# Patient Record
Sex: Female | Born: 1950 | Race: Black or African American | Hispanic: No | Marital: Single | Smoking: Never smoker
Health system: Southern US, Community
[De-identification: ages and names within clinical notes are randomized; demographics above are authoritative.]

## PROBLEM LIST (undated history)

## (undated) DIAGNOSIS — K219 Gastro-esophageal reflux disease without esophagitis: Secondary | ICD-10-CM

## (undated) DIAGNOSIS — I10 Essential (primary) hypertension: Secondary | ICD-10-CM

## (undated) DIAGNOSIS — M1990 Unspecified osteoarthritis, unspecified site: Secondary | ICD-10-CM

## (undated) DIAGNOSIS — E039 Hypothyroidism, unspecified: Secondary | ICD-10-CM

## (undated) DIAGNOSIS — Z96651 Presence of right artificial knee joint: Secondary | ICD-10-CM

## (undated) DIAGNOSIS — M1710 Unilateral primary osteoarthritis, unspecified knee: Secondary | ICD-10-CM

## (undated) DIAGNOSIS — E559 Vitamin D deficiency, unspecified: Secondary | ICD-10-CM

## (undated) DIAGNOSIS — R42 Dizziness and giddiness: Secondary | ICD-10-CM

## (undated) DIAGNOSIS — R609 Edema, unspecified: Secondary | ICD-10-CM

## (undated) DIAGNOSIS — K469 Unspecified abdominal hernia without obstruction or gangrene: Secondary | ICD-10-CM

## (undated) DIAGNOSIS — D179 Benign lipomatous neoplasm, unspecified: Secondary | ICD-10-CM

## (undated) DIAGNOSIS — Z96652 Presence of left artificial knee joint: Secondary | ICD-10-CM

## (undated) DIAGNOSIS — R000 Tachycardia, unspecified: Secondary | ICD-10-CM

## (undated) DIAGNOSIS — E669 Obesity, unspecified: Secondary | ICD-10-CM

## (undated) DIAGNOSIS — T7840XA Allergy, unspecified, initial encounter: Secondary | ICD-10-CM

## (undated) DIAGNOSIS — E1169 Type 2 diabetes mellitus with other specified complication: Secondary | ICD-10-CM

## (undated) DIAGNOSIS — H9190 Unspecified hearing loss, unspecified ear: Secondary | ICD-10-CM

## (undated) DIAGNOSIS — G5603 Carpal tunnel syndrome, bilateral upper limbs: Secondary | ICD-10-CM

## (undated) DIAGNOSIS — D62 Acute posthemorrhagic anemia: Secondary | ICD-10-CM

## (undated) DIAGNOSIS — K5792 Diverticulitis of intestine, part unspecified, without perforation or abscess without bleeding: Secondary | ICD-10-CM

## (undated) HISTORY — DX: Presence of left artificial knee joint: Z96.652

## (undated) HISTORY — PX: COLONOSCOPY: SHX174

## (undated) HISTORY — DX: Acute posthemorrhagic anemia: D62

## (undated) HISTORY — DX: Benign lipomatous neoplasm, unspecified: D17.9

## (undated) HISTORY — DX: Unilateral primary osteoarthritis, unspecified knee: M17.10

## (undated) HISTORY — DX: Diverticulitis of intestine, part unspecified, without perforation or abscess without bleeding: K57.92

## (undated) HISTORY — DX: Hypothyroidism, unspecified: E03.9

## (undated) HISTORY — DX: Type 2 diabetes mellitus with other specified complication: E11.69

## (undated) HISTORY — DX: Unspecified osteoarthritis, unspecified site: M19.90

## (undated) HISTORY — DX: Obesity, unspecified: E66.9

## (undated) HISTORY — DX: Morbid (severe) obesity due to excess calories: E66.01

## (undated) HISTORY — DX: Tachycardia, unspecified: R00.0

## (undated) HISTORY — PX: JOINT REPLACEMENT: SHX530

## (undated) HISTORY — DX: Carpal tunnel syndrome, bilateral upper limbs: G56.03

## (undated) HISTORY — PX: TUBAL LIGATION: SHX77

## (undated) HISTORY — PX: CARPAL TUNNEL RELEASE: SHX101

## (undated) HISTORY — DX: Presence of right artificial knee joint: Z96.651

## (undated) HISTORY — DX: Vitamin D deficiency, unspecified: E55.9

## (undated) HISTORY — DX: Edema, unspecified: R60.9

## (undated) HISTORY — DX: Allergy, unspecified, initial encounter: T78.40XA

## (undated) HISTORY — DX: Unspecified abdominal hernia without obstruction or gangrene: K46.9

## (undated) HISTORY — PX: ANKLE FRACTURE SURGERY: SHX122

---

## 2003-07-24 ENCOUNTER — Emergency Department (HOSPITAL_COMMUNITY): Admission: AD | Admit: 2003-07-24 | Discharge: 2003-07-24 | Payer: Self-pay | Admitting: Emergency Medicine

## 2004-10-13 ENCOUNTER — Emergency Department (HOSPITAL_COMMUNITY): Admission: EM | Admit: 2004-10-13 | Discharge: 2004-10-13 | Payer: Self-pay | Admitting: Family Medicine

## 2006-08-01 ENCOUNTER — Ambulatory Visit: Payer: Self-pay | Admitting: *Deleted

## 2006-08-01 ENCOUNTER — Ambulatory Visit: Payer: Self-pay | Admitting: Family Medicine

## 2006-12-18 HISTORY — PX: NOSE SURGERY: SHX723

## 2007-01-14 ENCOUNTER — Ambulatory Visit: Payer: Self-pay | Admitting: Family Medicine

## 2007-02-18 ENCOUNTER — Ambulatory Visit: Payer: Self-pay | Admitting: Family Medicine

## 2007-04-24 ENCOUNTER — Ambulatory Visit (HOSPITAL_COMMUNITY): Admission: RE | Admit: 2007-04-24 | Discharge: 2007-04-24 | Payer: Self-pay | Admitting: Family Medicine

## 2007-07-23 ENCOUNTER — Ambulatory Visit: Payer: Self-pay | Admitting: Family Medicine

## 2007-07-25 ENCOUNTER — Ambulatory Visit: Payer: Self-pay | Admitting: Internal Medicine

## 2007-09-04 ENCOUNTER — Encounter (INDEPENDENT_AMBULATORY_CARE_PROVIDER_SITE_OTHER): Payer: Self-pay | Admitting: *Deleted

## 2007-10-16 DIAGNOSIS — E1159 Type 2 diabetes mellitus with other circulatory complications: Secondary | ICD-10-CM | POA: Insufficient documentation

## 2007-10-16 DIAGNOSIS — M1710 Unilateral primary osteoarthritis, unspecified knee: Secondary | ICD-10-CM

## 2007-10-16 DIAGNOSIS — I152 Hypertension secondary to endocrine disorders: Secondary | ICD-10-CM | POA: Insufficient documentation

## 2007-10-16 DIAGNOSIS — I10 Essential (primary) hypertension: Secondary | ICD-10-CM

## 2007-10-16 DIAGNOSIS — IMO0002 Reserved for concepts with insufficient information to code with codable children: Secondary | ICD-10-CM

## 2007-10-16 HISTORY — DX: Morbid (severe) obesity due to excess calories: E66.01

## 2007-10-16 HISTORY — DX: Reserved for concepts with insufficient information to code with codable children: IMO0002

## 2007-11-13 ENCOUNTER — Emergency Department (HOSPITAL_COMMUNITY): Admission: EM | Admit: 2007-11-13 | Discharge: 2007-11-14 | Payer: Self-pay | Admitting: Emergency Medicine

## 2007-11-14 ENCOUNTER — Emergency Department (HOSPITAL_COMMUNITY): Admission: EM | Admit: 2007-11-14 | Discharge: 2007-11-14 | Payer: Self-pay | Admitting: Emergency Medicine

## 2007-11-15 ENCOUNTER — Emergency Department (HOSPITAL_COMMUNITY): Admission: EM | Admit: 2007-11-15 | Discharge: 2007-11-15 | Payer: Self-pay | Admitting: Emergency Medicine

## 2007-11-17 ENCOUNTER — Emergency Department (HOSPITAL_COMMUNITY): Admission: EM | Admit: 2007-11-17 | Discharge: 2007-11-17 | Payer: Self-pay | Admitting: Emergency Medicine

## 2007-11-18 ENCOUNTER — Ambulatory Visit (HOSPITAL_COMMUNITY): Admission: RE | Admit: 2007-11-18 | Discharge: 2007-11-18 | Payer: Self-pay | Admitting: Otolaryngology

## 2007-11-21 ENCOUNTER — Observation Stay (HOSPITAL_COMMUNITY): Admission: EM | Admit: 2007-11-21 | Discharge: 2007-11-22 | Payer: Self-pay | Admitting: Emergency Medicine

## 2008-03-02 ENCOUNTER — Emergency Department (HOSPITAL_COMMUNITY): Admission: EM | Admit: 2008-03-02 | Discharge: 2008-03-02 | Payer: Self-pay | Admitting: Emergency Medicine

## 2010-05-11 ENCOUNTER — Emergency Department (HOSPITAL_COMMUNITY): Admission: EM | Admit: 2010-05-11 | Discharge: 2010-05-11 | Payer: Self-pay | Admitting: Internal Medicine

## 2011-01-17 NOTE — Miscellaneous (Signed)
Summary: VIP  Patient: [name] Note: All result statuses are Final unless otherwise noted.  Tests: (1) VIP (Medications)   LLIMPORTMEDS              "Result Below..."       RESULT: METFORMIN HCL TABS 850 MG*TAKE ONE (1) TABLET BY MOUTH TWO (2) TIMES DAILY*09/03/2007*Last Refill: [id number]*******   LLIMPORTMEDS              "Result Below..."       RESULT: HYDROCHLOROTHIAZIDE TABS 25 MG*TAKE ONE-HALF TABLET BY MOUTH EVERY MORNING*09/03/2007*Last Refill: Unknown*[id number]*******   LLIMPORTMEDS              "Result Below..."       RESULT: BENAZEPRIL HCL TABS 10 MG*TAKE ONE (1) TABLET BY MOUTH EVERY DAY*09/03/2007*Last Refill: [id number]*[id number]*******   LLIMPORTMEDS              "Result Below..."       RESULT: AMARYL TABS 4 MG*TAKE ONE TABLET BY MOUTH EVERY MORNING AND TAKE ONE-HALF TABLET BY MOUTH EACH EVENING WITH SUPPER GENE HAS ICP AMARYL 4MG  05/08*09/03/2007*Last Refill: Unknown*[id number]*******   LLIMPORTALLS              NKDA***  Note: An exclamation mark (!) indicates a result that was not dispersed into the flowsheet. Document Creation Date: 10/17/2007 3:05 PM _______________________________________________________________________  (1) Order result status: Final Collection or observation date-time: 09/04/2007 Requested date-time: 09/04/2007 Receipt date-time:  Reported date-time: 09/04/2007 Referring Physician:   Ordering Physician:   Specimen Source:  Source: [place] Order Number:  Lab site:

## 2011-02-08 ENCOUNTER — Other Ambulatory Visit (HOSPITAL_COMMUNITY): Payer: Self-pay | Admitting: Internal Medicine

## 2011-02-08 DIAGNOSIS — Z1231 Encounter for screening mammogram for malignant neoplasm of breast: Secondary | ICD-10-CM

## 2011-02-16 ENCOUNTER — Ambulatory Visit (HOSPITAL_COMMUNITY)
Admission: RE | Admit: 2011-02-16 | Discharge: 2011-02-16 | Disposition: A | Discharge status: Home / Self Care | Payer: Self-pay | Source: Ambulatory Visit | Attending: Internal Medicine | Admitting: Internal Medicine

## 2011-02-16 DIAGNOSIS — Z1231 Encounter for screening mammogram for malignant neoplasm of breast: Secondary | ICD-10-CM | POA: Insufficient documentation

## 2011-05-02 NOTE — Op Note (Signed)
NAME:  [name], [name]                ACCOUNT NO.:  [id number]   MEDICAL RECORD NO.:  [id number]          PATIENT TYPE:  AMB   LOCATION:  SDS                          FACILITY:  [facility]   PHYSICIAN:  Christopher E. Ezzard Standing, M.D.DATE OF BIRTH:  04-14-1951   DATE OF PROCEDURE:  11/18/2007  DATE OF DISCHARGE:  11/18/2007                               OPERATIVE REPORT   .   PREOPERATIVE DIAGNOSIS:  Recurrent left sided epistaxis.   POSTOPERATIVE DIAGNOSIS:  Recurrent left sided epistaxis.   OPERATION:  Endoscopic evaluation and cauterization of left sided  epistaxis.   SURGEON:  Dillard Cannon, M.D.   ANESTHESIA:  General endotracheal.   COMPLICATIONS:  None.   BRIEF CLINICAL NOTE:  [name] is a 60 year old female who has had  recurrent nose bleeds this past week.  She has been to the emergency  room three times and had her nose packed with recurrent bleeding.  I saw  her in the emergency room last night with bleeding from the superior  portion of the left nasal cavity.  I could not identify definite site or  origin of the nose bleed.  The nose was packed with some Vaseline gauze,  but she has continued to have some recurrent episodes of bleeding since  that time.  She is taken to the operating room at this time for  cauterization of left-sided epistaxis.  Her hemoglobin initially was 12  on November 14, 2007, four days ago and presently hemoglobin is 9.1.   DESCRIPTION OF PROCEDURE:  After adequate endotracheal anesthesia, the  nose was evaluated.  The patient had several scabs and crusting on the  septum as well as the anterior middle turbinate where she had previous  cauterization and packing placed.  There is a little bit of brisk  bleeding from superior portion of the septum on the left side and this  was cauterized with suction cautery.  All the bleeding sites were  cauterized with suction cautery including the inferior turbinate and  anterior left middle turbinate.   Next, nasal endoscopy was performed  with the 0 endoscope.  Posterior nasal cavity was evaluated.  There is  no obvious bleeding site.  There is no blood clot or bleeding in the  posterior nasal cavity.  The middle meatus was evaluated and again no  obvious bleeding site or prominent vessel identified.  After adequate  cauterization and no further bleeding noted,  the nose was packed with  Gelfoam soaked in thrombin placed in the superior nasal cavity  anteriorly and then the floor of the nose along the inferior turbinate  was packed with a single Merocel soaked in bacitracin ointment.  [name]  was awoken from anesthesia and transferred to recovery and postop doing  well.   DISPOSITION:  [name] was discharged home later this evening.  She has  antibiotics that she will continue taking along with regular medicines,  Tylenol p.r.n. pain.  I will have her follow-up in my office in four  days to have the Merocel pack removed.           ______________________________  Kristine Garbe. Ezzard Standing, M.D.     CEN/MEDQ  D:  11/18/2007  T:  11/19/2007  Job:  [id number]   cc:   Kristine Garbe. Ezzard Standing, M.D.

## 2011-05-02 NOTE — Consult Note (Signed)
[name]                ACCOUNT NO.:  [id number]   MEDICAL RECORD NO.:  [id number]          PATIENT TYPE:  EMS   LOCATION:  ED                           FACILITY:  [facility]   PHYSICIAN:  Kristine Garbe. Ezzard Standing, M.D.DATE OF BIRTH:  March 16, 1951   DATE OF CONSULTATION:  11/17/2007  DATE OF DISCHARGE:  11/17/2007                                 CONSULTATION   REASON FOR CONSULT:  Evaluate patient with epistaxis.   BRIEF HISTORY:  [name] as is a 60 year old female who has had  recurrent left-sided nosebleeds for the past 4 days and she has had 3  visits to [facility] Emergency Room because of recurrent bleeding.  She  has had cauterization as well as Merocel pack, as well as Rhino Rocket  packing with recurrent bleeding.  Bleeding has been from the left side.   PHYSICAL EXAMINATION:  HEENT:  On examination, she has bleeding from the  left nostril on removing the Merocel pack.  The bleeding appears to be  coming from the superior mid posterior portion of the nose, but I really  could not identify a definite site of bleeding, but had a fair amount of  blood coming down from the superior aspect of the nose.  I was unable to  adequately visualize a site or origin of bleeding for adequate  cauterization.  I was able to pack the nose with Vaseline gauze 1/2-inch  packing, which adequately controlled the bleeding.   IMPRESSION:  Epistaxis from the superoposterior left nasal cavity.  This  was packed with 1/2-inch Vaseline gauze.  We will plan on leaving this  in place for 4 days and have her follow up in my office on Thursday for  recheck to have the nasal packs removed.  She is on Augmentin antibiotic  and Vicodin for pain.  She is instructed to stop her aspirin.           ______________________________  Kristine Garbe. Ezzard Standing, M.D.     CEN/MEDQ  D:  11/17/2007  T:  11/18/2007  Job:  [id number]   cc:   Kristine Garbe. Ezzard Standing, M.D.  Fax: [phone]

## 2011-05-02 NOTE — H&P (Signed)
NAMETAELAR, GRONEWOLD                ACCOUNT NO.:  [id number]   MEDICAL RECORD NO.:  [id number]          PATIENT TYPE:  OBV   LOCATION:  1318                         FACILITY:  [facility]   PHYSICIAN:  Kristine Garbe. Ezzard Standing, M.D.DATE OF BIRTH:  1951-02-27   DATE OF ADMISSION:  11/20/2007  DATE OF DISCHARGE:                              HISTORY & PHYSICAL   REASON FOR ADMISSION:  Patient with recurrent epistaxis.   BRIEF HISTORY:  [name] is a 60 year old female with history of  hypertension and non-insulin dependent diabetes.  She initially had  bleeding from the left nostril that started about a week ago.  She was  seen in [facility] Room three times because of  recurrent bleeding despite packing.  She was subsequently taken to the  operating room on Monday, November 18, 2007, where she had cauterization  of several areas in the left side of the nose and the nose was packed  with Merocel pack.  She did well for a couple of days but then had  recurrent bleeding yesterday and again last night from the portion of  her nose as well as down the throat and from the right nostril.  She  presented back to the emergency room where the bleeding again subsided  and patient was admitted at this time for observation.  Admission  hemoglobin was 8.5.  Patient is presently having no bleeding.   MEDICATIONS:  1. Amaryl one in the morning and half a tablet at night.  2. Metformin 850 mg b.i.d.  3. Benzopril 10 mg daily.  4. Hydrochlorothiazide, she takes mostly during the summer when she      gets swelling in her ankles and does not take as much in the winter      months.  5. She has also been placed on Augmentin since her nose has been      packed, 875 b.i.d.  6. She stopped aspirin this past weekend when she started having nose      bleeds.   PAST MEDICAL HISTORY:  Significant for  1. Hypertension.  2. Non-insulin dependent diabetes.   PHYSICAL EXAMINATION:  GENERAL  APPEARANCE:  The patient is alert and  oriented and having no airway problems and no active bleeding.  HEENT:  She has a Merocel pack in the left nostril.  She is breathing  through her right nostril.  There is no active bleeding noted, although  she states she feels a throbbing in her nose.  LUNGS:  Clear.  CARDIOVASCULAR:  Regular rate and rhythm without murmur  NEUROLOGIC:  Intact.   IMPRESSION:  Recurrent left-sided epistaxis posterior.   RECOMMENDATIONS:  Will observe in hospital for 24 hours and if she has  recurrent bleeding, will plan on taking back to the operating room for  removal of packing and recauterization as necessary.           ______________________________  Kristine Garbe Ezzard Standing, M.D.     CEN/MEDQ  D:  11/21/2007  T:  11/21/2007  Job:  [id number]

## 2011-07-02 ENCOUNTER — Inpatient Hospital Stay (HOSPITAL_COMMUNITY)
Admission: EM | Admit: 2011-07-02 | Discharge: 2011-07-18 | DRG: 354 | Disposition: A | Discharge status: Home / Self Care | Payer: Medicaid Other | Attending: Surgery | Admitting: Surgery

## 2011-07-02 ENCOUNTER — Emergency Department (HOSPITAL_COMMUNITY): Payer: Medicaid Other

## 2011-07-02 ENCOUNTER — Encounter (HOSPITAL_COMMUNITY): Payer: Self-pay | Admitting: Radiology

## 2011-07-02 DIAGNOSIS — M1710 Unilateral primary osteoarthritis, unspecified knee: Secondary | ICD-10-CM | POA: Diagnosis present

## 2011-07-02 DIAGNOSIS — Z5331 Laparoscopic surgical procedure converted to open procedure: Secondary | ICD-10-CM

## 2011-07-02 DIAGNOSIS — K436 Other and unspecified ventral hernia with obstruction, without gangrene: Secondary | ICD-10-CM

## 2011-07-02 DIAGNOSIS — K560 Paralytic ileus: Secondary | ICD-10-CM | POA: Diagnosis not present

## 2011-07-02 DIAGNOSIS — Z6841 Body Mass Index (BMI) 40.0 and over, adult: Secondary | ICD-10-CM

## 2011-07-02 DIAGNOSIS — Y838 Other surgical procedures as the cause of abnormal reaction of the patient, or of later complication, without mention of misadventure at the time of the procedure: Secondary | ICD-10-CM | POA: Diagnosis not present

## 2011-07-02 DIAGNOSIS — E669 Obesity, unspecified: Secondary | ICD-10-CM | POA: Diagnosis present

## 2011-07-02 DIAGNOSIS — K929 Disease of digestive system, unspecified: Secondary | ICD-10-CM | POA: Diagnosis not present

## 2011-07-02 DIAGNOSIS — I10 Essential (primary) hypertension: Secondary | ICD-10-CM | POA: Diagnosis present

## 2011-07-02 DIAGNOSIS — E119 Type 2 diabetes mellitus without complications: Secondary | ICD-10-CM | POA: Diagnosis present

## 2011-07-02 LAB — COMPREHENSIVE METABOLIC PANEL
ALT: 8 U/L (ref 0–35)
AST: 11 U/L (ref 0–37)
Albumin: 3.7 g/dL (ref 3.5–5.2)
Alkaline Phosphatase: 80 U/L (ref 39–117)
BUN: 14 mg/dL (ref 6–23)
CO2: 26 mEq/L (ref 19–32)
Calcium: 9.7 mg/dL (ref 8.4–10.5)
Chloride: 98 mEq/L (ref 96–112)
Creatinine, Ser: 0.68 mg/dL (ref 0.50–1.10)
GFR calc Af Amer: >60 mL/min (ref >60)
GFR calc non Af Amer: >60 mL/min (ref >60)
Glucose, Bld: 242 mg/dL — ABNORMAL HIGH (ref 70–99)
Potassium: 3.8 mEq/L (ref 3.5–5.1)
Sodium: 137 mEq/L (ref 135–145)
Total Bilirubin: 0.3 mg/dL (ref 0.3–1.2)
Total Protein: 7.7 g/dL (ref 6.0–8.3)

## 2011-07-02 LAB — CBC
HCT: 40.7 % (ref 36.0–46.0)
Hemoglobin: 13.1 g/dL (ref 12.0–15.0)
MCH: 30.3 pg (ref 26.0–34.0)
MCHC: 32.2 g/dL (ref 30.0–36.0)
MCV: 94 fL (ref 78.0–100.0)
Platelets: 275 10*3/uL (ref 150–400)
RBC: 4.33 MIL/uL (ref 3.87–5.11)
RDW: 12.8 % (ref 11.5–15.5)
WBC: 12.1 10*3/uL — ABNORMAL HIGH (ref 4.0–10.5)

## 2011-07-02 LAB — URINALYSIS, ROUTINE W REFLEX MICROSCOPIC
Bilirubin Urine: NEGATIVE
Glucose, UA: NEGATIVE mg/dL
Hgb urine dipstick: NEGATIVE
Ketones, ur: NEGATIVE mg/dL
Leukocytes, UA: NEGATIVE
Nitrite: NEGATIVE
Protein, ur: NEGATIVE mg/dL
Specific Gravity, Urine: 1.023 (ref 1.005–1.030)
Urobilinogen, UA: 0.2 mg/dL (ref 0.0–1.0)
pH: 5.5 (ref 5.0–8.0)

## 2011-07-02 LAB — GLUCOSE, CAPILLARY
Glucose-Capillary: 119 mg/dL — ABNORMAL HIGH (ref 70–99)
Glucose-Capillary: 165 mg/dL — ABNORMAL HIGH (ref 70–99)
Glucose-Capillary: 185 mg/dL — ABNORMAL HIGH (ref 70–99)

## 2011-07-02 LAB — DIFFERENTIAL
Basophils Absolute: 0 10*3/uL (ref 0.0–0.1)
Basophils Relative: 0 % (ref 0–1)
Eosinophils Absolute: 0.1 10*3/uL (ref 0.0–0.7)
Eosinophils Relative: 1 % (ref 0–5)
Lymphocytes Relative: 10 % — ABNORMAL LOW (ref 12–46)
Lymphs Abs: 1.2 10*3/uL (ref 0.7–4.0)
Monocytes Absolute: 0.5 10*3/uL (ref 0.1–1.0)
Monocytes Relative: 4 % (ref 3–12)
Neutro Abs: 10.3 10*3/uL — ABNORMAL HIGH (ref 1.7–7.7)
Neutrophils Relative %: 85 % — ABNORMAL HIGH (ref 43–77)

## 2011-07-02 LAB — LIPASE, BLOOD: Lipase: 10 U/L — ABNORMAL LOW (ref 11–59)

## 2011-07-02 MED ORDER — IOHEXOL 300 MG/ML  SOLN
130.0000 mL | Freq: Once | INTRAMUSCULAR | Status: AC | PRN
  Administered 2011-07-02: 130 mL via INTRAVENOUS

## 2011-07-03 ENCOUNTER — Other Ambulatory Visit (INDEPENDENT_AMBULATORY_CARE_PROVIDER_SITE_OTHER): Payer: Self-pay | Admitting: Surgery

## 2011-07-03 DIAGNOSIS — K660 Peritoneal adhesions (postprocedural) (postinfection): Secondary | ICD-10-CM

## 2011-07-03 HISTORY — PX: HERNIA REPAIR: SHX51

## 2011-07-03 LAB — CBC
HCT: 35.4 % — ABNORMAL LOW (ref 36.0–46.0)
Hemoglobin: 11.2 g/dL — ABNORMAL LOW (ref 12.0–15.0)
MCH: 29.9 pg (ref 26.0–34.0)
MCHC: 31.6 g/dL (ref 30.0–36.0)
MCV: 94.7 fL (ref 78.0–100.0)
Platelets: 247 10*3/uL (ref 150–400)
RBC: 3.74 MIL/uL — ABNORMAL LOW (ref 3.87–5.11)
RDW: 13 % (ref 11.5–15.5)
WBC: 8.9 10*3/uL (ref 4.0–10.5)

## 2011-07-03 LAB — GLUCOSE, CAPILLARY
Glucose-Capillary: 200 mg/dL — ABNORMAL HIGH (ref 70–99)
Glucose-Capillary: 129 mg/dL — ABNORMAL HIGH (ref 70–99)
Glucose-Capillary: 221 mg/dL — ABNORMAL HIGH (ref 70–99)
Glucose-Capillary: 172 mg/dL — ABNORMAL HIGH (ref 70–99)

## 2011-07-03 LAB — BASIC METABOLIC PANEL
BUN: 9 mg/dL (ref 6–23)
CO2: 29 mEq/L (ref 19–32)
Calcium: 8.8 mg/dL (ref 8.4–10.5)
Chloride: 103 mEq/L (ref 96–112)
Creatinine, Ser: 0.67 mg/dL (ref 0.50–1.10)
GFR calc Af Amer: >60 mL/min (ref >60)
GFR calc non Af Amer: >60 mL/min (ref >60)
Glucose, Bld: 153 mg/dL — ABNORMAL HIGH (ref 70–99)
Potassium: 3.9 mEq/L (ref 3.5–5.1)
Sodium: 140 mEq/L (ref 135–145)

## 2011-07-03 LAB — HEMOGLOBIN A1C
Hgb A1c MFr Bld: 7.8 % — ABNORMAL HIGH (ref <5.7)
Mean Plasma Glucose: 177 mg/dL — ABNORMAL HIGH (ref <117)

## 2011-07-04 LAB — BASIC METABOLIC PANEL
BUN: 14 mg/dL (ref 6–23)
CO2: 27 mEq/L (ref 19–32)
Calcium: 8.3 mg/dL — ABNORMAL LOW (ref 8.4–10.5)
Chloride: 104 mEq/L (ref 96–112)
Creatinine, Ser: 0.99 mg/dL (ref 0.50–1.10)
GFR calc Af Amer: >60 mL/min (ref >60)
GFR calc non Af Amer: 58 mL/min — ABNORMAL LOW (ref >60)
Glucose, Bld: 134 mg/dL — ABNORMAL HIGH (ref 70–99)
Potassium: 4.4 mEq/L (ref 3.5–5.1)
Sodium: 140 mEq/L (ref 135–145)

## 2011-07-04 LAB — GLUCOSE, CAPILLARY
Glucose-Capillary: 123 mg/dL — ABNORMAL HIGH (ref 70–99)
Glucose-Capillary: 142 mg/dL — ABNORMAL HIGH (ref 70–99)
Glucose-Capillary: 153 mg/dL — ABNORMAL HIGH (ref 70–99)
Glucose-Capillary: 160 mg/dL — ABNORMAL HIGH (ref 70–99)
Glucose-Capillary: 168 mg/dL — ABNORMAL HIGH (ref 70–99)
Glucose-Capillary: 168 mg/dL — ABNORMAL HIGH (ref 70–99)

## 2011-07-04 LAB — MAGNESIUM: Magnesium: 1.6 mg/dL (ref 1.5–2.5)

## 2011-07-04 LAB — CBC
HCT: 34.2 % — ABNORMAL LOW (ref 36.0–46.0)
Hemoglobin: 10.9 g/dL — ABNORMAL LOW (ref 12.0–15.0)
MCH: 30.1 pg (ref 26.0–34.0)
MCHC: 31.9 g/dL (ref 30.0–36.0)
MCV: 94.5 fL (ref 78.0–100.0)
Platelets: 249 10*3/uL (ref 150–400)
RBC: 3.62 MIL/uL — ABNORMAL LOW (ref 3.87–5.11)
RDW: 13.1 % (ref 11.5–15.5)
WBC: 11.3 10*3/uL — ABNORMAL HIGH (ref 4.0–10.5)

## 2011-07-04 LAB — PHOSPHORUS: Phosphorus: 3.6 mg/dL (ref 2.3–4.6)

## 2011-07-05 LAB — BASIC METABOLIC PANEL
BUN: 18 mg/dL (ref 6–23)
CO2: 24 mEq/L (ref 19–32)
Calcium: 8.1 mg/dL — ABNORMAL LOW (ref 8.4–10.5)
Chloride: 101 mEq/L (ref 96–112)
Creatinine, Ser: 0.89 mg/dL (ref 0.50–1.10)
GFR calc Af Amer: >60 mL/min (ref >60)
GFR calc non Af Amer: >60 mL/min (ref >60)
Glucose, Bld: 142 mg/dL — ABNORMAL HIGH (ref 70–99)
Potassium: 4.3 mEq/L (ref 3.5–5.1)
Sodium: 135 mEq/L (ref 135–145)

## 2011-07-05 LAB — GLUCOSE, CAPILLARY
Glucose-Capillary: 122 mg/dL — ABNORMAL HIGH (ref 70–99)
Glucose-Capillary: 134 mg/dL — ABNORMAL HIGH (ref 70–99)
Glucose-Capillary: 139 mg/dL — ABNORMAL HIGH (ref 70–99)
Glucose-Capillary: 150 mg/dL — ABNORMAL HIGH (ref 70–99)
Glucose-Capillary: 142 mg/dL — ABNORMAL HIGH (ref 70–99)

## 2011-07-05 LAB — PHOSPHORUS: Phosphorus: 2.8 mg/dL (ref 2.3–4.6)

## 2011-07-05 LAB — HEMOGLOBIN AND HEMATOCRIT, BLOOD
HCT: 33 % — ABNORMAL LOW (ref 36.0–46.0)
Hemoglobin: 10.6 g/dL — ABNORMAL LOW (ref 12.0–15.0)

## 2011-07-05 LAB — MAGNESIUM: Magnesium: 1.7 mg/dL (ref 1.5–2.5)

## 2011-07-06 LAB — GLUCOSE, CAPILLARY
Glucose-Capillary: 125 mg/dL — ABNORMAL HIGH (ref 70–99)
Glucose-Capillary: 142 mg/dL — ABNORMAL HIGH (ref 70–99)
Glucose-Capillary: 155 mg/dL — ABNORMAL HIGH (ref 70–99)
Glucose-Capillary: 152 mg/dL — ABNORMAL HIGH (ref 70–99)
Glucose-Capillary: 141 mg/dL — ABNORMAL HIGH (ref 70–99)
Glucose-Capillary: 166 mg/dL — ABNORMAL HIGH (ref 70–99)

## 2011-07-06 LAB — BASIC METABOLIC PANEL
BUN: 20 mg/dL (ref 6–23)
CO2: 20 mEq/L (ref 19–32)
Calcium: 8.7 mg/dL (ref 8.4–10.5)
Chloride: 102 mEq/L (ref 96–112)
Creatinine, Ser: 0.79 mg/dL (ref 0.50–1.10)
GFR calc Af Amer: >60 mL/min (ref >60)
GFR calc non Af Amer: >60 mL/min (ref >60)
Glucose, Bld: 143 mg/dL — ABNORMAL HIGH (ref 70–99)
Potassium: 5.1 mEq/L (ref 3.5–5.1)
Sodium: 136 mEq/L (ref 135–145)

## 2011-07-06 LAB — CBC
HCT: 33.4 % — ABNORMAL LOW (ref 36.0–46.0)
Hemoglobin: 10.6 g/dL — ABNORMAL LOW (ref 12.0–15.0)
MCH: 30.4 pg (ref 26.0–34.0)
MCHC: 31.7 g/dL (ref 30.0–36.0)
MCV: 95.7 fL (ref 78.0–100.0)
Platelets: 304 10*3/uL (ref 150–400)
RBC: 3.49 MIL/uL — ABNORMAL LOW (ref 3.87–5.11)
RDW: 13.4 % (ref 11.5–15.5)
WBC: 13.6 10*3/uL — ABNORMAL HIGH (ref 4.0–10.5)

## 2011-07-07 LAB — GLUCOSE, CAPILLARY
Glucose-Capillary: 135 mg/dL — ABNORMAL HIGH (ref 70–99)
Glucose-Capillary: 156 mg/dL — ABNORMAL HIGH (ref 70–99)
Glucose-Capillary: 179 mg/dL — ABNORMAL HIGH (ref 70–99)
Glucose-Capillary: 158 mg/dL — ABNORMAL HIGH (ref 70–99)
Glucose-Capillary: 158 mg/dL — ABNORMAL HIGH (ref 70–99)
Glucose-Capillary: 151 mg/dL — ABNORMAL HIGH (ref 70–99)
Glucose-Capillary: 179 mg/dL — ABNORMAL HIGH (ref 70–99)
Glucose-Capillary: 149 mg/dL — ABNORMAL HIGH (ref 70–99)

## 2011-07-08 ENCOUNTER — Inpatient Hospital Stay (HOSPITAL_COMMUNITY): Payer: Medicaid Other

## 2011-07-08 LAB — CBC
HCT: 33.8 % — ABNORMAL LOW (ref 36.0–46.0)
Hemoglobin: 10.8 g/dL — ABNORMAL LOW (ref 12.0–15.0)
MCH: 30.3 pg (ref 26.0–34.0)
MCHC: 32 g/dL (ref 30.0–36.0)
MCV: 94.7 fL (ref 78.0–100.0)
Platelets: 477 10*3/uL — ABNORMAL HIGH (ref 150–400)
RBC: 3.57 MIL/uL — ABNORMAL LOW (ref 3.87–5.11)
RDW: 13.1 % (ref 11.5–15.5)
WBC: 8.4 10*3/uL (ref 4.0–10.5)

## 2011-07-08 LAB — GLUCOSE, CAPILLARY
Glucose-Capillary: 120 mg/dL — ABNORMAL HIGH (ref 70–99)
Glucose-Capillary: 155 mg/dL — ABNORMAL HIGH (ref 70–99)
Glucose-Capillary: 164 mg/dL — ABNORMAL HIGH (ref 70–99)
Glucose-Capillary: 166 mg/dL — ABNORMAL HIGH (ref 70–99)
Glucose-Capillary: 166 mg/dL — ABNORMAL HIGH (ref 70–99)
Glucose-Capillary: 184 mg/dL — ABNORMAL HIGH (ref 70–99)
Glucose-Capillary: 187 mg/dL — ABNORMAL HIGH (ref 70–99)

## 2011-07-08 LAB — BASIC METABOLIC PANEL
BUN: 19 mg/dL (ref 6–23)
CO2: 17 mEq/L — ABNORMAL LOW (ref 19–32)
Calcium: 9.5 mg/dL (ref 8.4–10.5)
Chloride: 103 mEq/L (ref 96–112)
Creatinine, Ser: 0.69 mg/dL (ref 0.50–1.10)
GFR calc Af Amer: >60 mL/min (ref >60)
GFR calc non Af Amer: >60 mL/min (ref >60)
Glucose, Bld: 176 mg/dL — ABNORMAL HIGH (ref 70–99)
Potassium: 5 mEq/L (ref 3.5–5.1)
Sodium: 137 mEq/L (ref 135–145)

## 2011-07-09 LAB — GLUCOSE, CAPILLARY
Glucose-Capillary: 161 mg/dL — ABNORMAL HIGH (ref 70–99)
Glucose-Capillary: 154 mg/dL — ABNORMAL HIGH (ref 70–99)
Glucose-Capillary: 176 mg/dL — ABNORMAL HIGH (ref 70–99)
Glucose-Capillary: 137 mg/dL — ABNORMAL HIGH (ref 70–99)
Glucose-Capillary: 146 mg/dL — ABNORMAL HIGH (ref 70–99)

## 2011-07-10 LAB — GLUCOSE, CAPILLARY
Glucose-Capillary: 133 mg/dL — ABNORMAL HIGH (ref 70–99)
Glucose-Capillary: 141 mg/dL — ABNORMAL HIGH (ref 70–99)
Glucose-Capillary: 153 mg/dL — ABNORMAL HIGH (ref 70–99)
Glucose-Capillary: 154 mg/dL — ABNORMAL HIGH (ref 70–99)
Glucose-Capillary: 163 mg/dL — ABNORMAL HIGH (ref 70–99)
Glucose-Capillary: 174 mg/dL — ABNORMAL HIGH (ref 70–99)

## 2011-07-11 LAB — BASIC METABOLIC PANEL
BUN: 8 mg/dL (ref 6–23)
CO2: 21 mEq/L (ref 19–32)
Calcium: 9.4 mg/dL (ref 8.4–10.5)
Chloride: 105 mEq/L (ref 96–112)
Creatinine, Ser: 0.67 mg/dL (ref 0.50–1.10)
GFR calc Af Amer: >60 mL/min (ref >60)
GFR calc non Af Amer: >60 mL/min (ref >60)
Glucose, Bld: 183 mg/dL — ABNORMAL HIGH (ref 70–99)
Potassium: 5.6 mEq/L — ABNORMAL HIGH (ref 3.5–5.1)
Sodium: 136 mEq/L (ref 135–145)

## 2011-07-11 LAB — GLUCOSE, CAPILLARY
Glucose-Capillary: 112 mg/dL — ABNORMAL HIGH (ref 70–99)
Glucose-Capillary: 123 mg/dL — ABNORMAL HIGH (ref 70–99)
Glucose-Capillary: 129 mg/dL — ABNORMAL HIGH (ref 70–99)
Glucose-Capillary: 175 mg/dL — ABNORMAL HIGH (ref 70–99)
Glucose-Capillary: 181 mg/dL — ABNORMAL HIGH (ref 70–99)
Glucose-Capillary: 185 mg/dL — ABNORMAL HIGH (ref 70–99)

## 2011-07-11 LAB — CBC
HCT: 30.8 % — ABNORMAL LOW (ref 36.0–46.0)
Hemoglobin: 10 g/dL — ABNORMAL LOW (ref 12.0–15.0)
MCH: 30.2 pg (ref 26.0–34.0)
MCHC: 32.5 g/dL (ref 30.0–36.0)
MCV: 93.1 fL (ref 78.0–100.0)
Platelets: 477 10*3/uL — ABNORMAL HIGH (ref 150–400)
RBC: 3.31 MIL/uL — ABNORMAL LOW (ref 3.87–5.11)
RDW: 13.4 % (ref 11.5–15.5)
WBC: 10.5 10*3/uL (ref 4.0–10.5)

## 2011-07-12 LAB — GLUCOSE, CAPILLARY
Glucose-Capillary: 109 mg/dL — ABNORMAL HIGH (ref 70–99)
Glucose-Capillary: 110 mg/dL — ABNORMAL HIGH (ref 70–99)
Glucose-Capillary: 124 mg/dL — ABNORMAL HIGH (ref 70–99)
Glucose-Capillary: 139 mg/dL — ABNORMAL HIGH (ref 70–99)
Glucose-Capillary: 140 mg/dL — ABNORMAL HIGH (ref 70–99)
Glucose-Capillary: 97 mg/dL (ref 70–99)

## 2011-07-12 LAB — BASIC METABOLIC PANEL
BUN: 9 mg/dL (ref 6–23)
CO2: 21 mEq/L (ref 19–32)
Calcium: 9.2 mg/dL (ref 8.4–10.5)
Chloride: 105 mEq/L (ref 96–112)
Creatinine, Ser: 0.63 mg/dL (ref 0.50–1.10)
GFR calc Af Amer: >60 mL/min (ref >60)
GFR calc non Af Amer: >60 mL/min (ref >60)
Glucose, Bld: 111 mg/dL — ABNORMAL HIGH (ref 70–99)
Potassium: 4.1 mEq/L (ref 3.5–5.1)
Sodium: 138 mEq/L (ref 135–145)

## 2011-07-13 ENCOUNTER — Inpatient Hospital Stay (HOSPITAL_COMMUNITY): Payer: Medicaid Other

## 2011-07-13 LAB — BASIC METABOLIC PANEL
BUN: 8 mg/dL (ref 6–23)
CO2: 21 mEq/L (ref 19–32)
Calcium: 9 mg/dL (ref 8.4–10.5)
Chloride: 106 mEq/L (ref 96–112)
Creatinine, Ser: 0.6 mg/dL (ref 0.50–1.10)
GFR calc Af Amer: >60 mL/min (ref >60)
GFR calc non Af Amer: >60 mL/min (ref >60)
Glucose, Bld: 112 mg/dL — ABNORMAL HIGH (ref 70–99)
Potassium: 4.1 mEq/L (ref 3.5–5.1)
Sodium: 140 mEq/L (ref 135–145)

## 2011-07-13 LAB — GLUCOSE, CAPILLARY
Glucose-Capillary: 118 mg/dL — ABNORMAL HIGH (ref 70–99)
Glucose-Capillary: 96 mg/dL (ref 70–99)
Glucose-Capillary: 96 mg/dL (ref 70–99)

## 2011-07-14 LAB — CBC
HCT: 30.9 % — ABNORMAL LOW (ref 36.0–46.0)
Hemoglobin: 9.7 g/dL — ABNORMAL LOW (ref 12.0–15.0)
MCH: 29.3 pg (ref 26.0–34.0)
MCHC: 31.4 g/dL (ref 30.0–36.0)
MCV: 93.4 fL (ref 78.0–100.0)
Platelets: 472 10*3/uL — ABNORMAL HIGH (ref 150–400)
RBC: 3.31 MIL/uL — ABNORMAL LOW (ref 3.87–5.11)
RDW: 13.5 % (ref 11.5–15.5)
WBC: 12.8 10*3/uL — ABNORMAL HIGH (ref 4.0–10.5)

## 2011-07-14 LAB — BASIC METABOLIC PANEL
BUN: 5 mg/dL — ABNORMAL LOW (ref 6–23)
CO2: 21 mEq/L (ref 19–32)
Calcium: 8.8 mg/dL (ref 8.4–10.5)
Chloride: 108 mEq/L (ref 96–112)
Creatinine, Ser: 0.58 mg/dL (ref 0.50–1.10)
GFR calc Af Amer: >60 mL/min (ref >60)
GFR calc non Af Amer: >60 mL/min (ref >60)
Glucose, Bld: 118 mg/dL — ABNORMAL HIGH (ref 70–99)
Potassium: 3.6 mEq/L (ref 3.5–5.1)
Sodium: 142 mEq/L (ref 135–145)

## 2011-07-14 LAB — GLUCOSE, CAPILLARY
Glucose-Capillary: 102 mg/dL — ABNORMAL HIGH (ref 70–99)
Glucose-Capillary: 123 mg/dL — ABNORMAL HIGH (ref 70–99)
Glucose-Capillary: 90 mg/dL (ref 70–99)
Glucose-Capillary: 99 mg/dL (ref 70–99)

## 2011-07-15 LAB — GLUCOSE, CAPILLARY
Glucose-Capillary: 95 mg/dL (ref 70–99)
Glucose-Capillary: 104 mg/dL — ABNORMAL HIGH (ref 70–99)
Glucose-Capillary: 102 mg/dL — ABNORMAL HIGH (ref 70–99)
Glucose-Capillary: 97 mg/dL (ref 70–99)

## 2011-07-16 LAB — GLUCOSE, CAPILLARY
Glucose-Capillary: 109 mg/dL — ABNORMAL HIGH (ref 70–99)
Glucose-Capillary: 116 mg/dL — ABNORMAL HIGH (ref 70–99)
Glucose-Capillary: 100 mg/dL — ABNORMAL HIGH (ref 70–99)
Glucose-Capillary: 118 mg/dL — ABNORMAL HIGH (ref 70–99)

## 2011-07-17 LAB — GLUCOSE, CAPILLARY
Glucose-Capillary: 106 mg/dL — ABNORMAL HIGH (ref 70–99)
Glucose-Capillary: 124 mg/dL — ABNORMAL HIGH (ref 70–99)
Glucose-Capillary: 110 mg/dL — ABNORMAL HIGH (ref 70–99)
Glucose-Capillary: 128 mg/dL — ABNORMAL HIGH (ref 70–99)

## 2011-07-17 NOTE — Op Note (Signed)
NAMEJENEE, SPAUGH                ACCOUNT NO.:  [id number]  MEDICAL RECORD NO.:  [id number]  LOCATION:  5155                         FACILITY:  [facility]  PHYSICIAN:  Sandria Bales. Ezzard Standing, M.D.  DATE OF BIRTH:  [birth year]  DATE OF PROCEDURE: 03 July 2011                              OPERATIVE REPORT   PREOPERATIVE DIAGNOSIS:  Massive incarcerated ventral hernia with small bowel colon.  POSTOPERATIVE DIAGNOSIS:  Incarcerated ventral hernia with small bowel and colon through an approximate 6 x 9 cm fascial defect.  PROCEDURES:  Laparoscopic-assisted ventral hernia repair with primary closure abdominal wall #1 Novafil, underlay 14 x 20 cm MTF mesh.  SURGEON:  Sandria Bales. Ezzard Standing, MD  FIRST ASSISTANT:  Brayton El, PA-C.  ANESTHESIA:  General endotracheal.  ESTIMATED BLOOD LOSS:  Minimal.  DRAINS:  Two 19 French Blake drains.  COMPLICATIONS:  None.  INDICATIONS FOR PROCEDURE:  Ms. Gruwell is a 60 year old African American female who has had a longstanding abdominal wall hernia.  She has not had very close medical care.  She was seen, at least most recently, by [facility] and she has no identified primary medical doctor.  She presented yesterday to the [facility] Emergency Room with abdominal pain, a CT scan showing a partially obstructed bowel secondary to a very large chronic incarcerated ventral hernia.  I discussed with the patient the indications and potential complications of hernia repair.  Potential complications include but not limited to bleeding, infection, recurrence of the hernia, bowel resection.  OPERATIVE NOTE:  The patient was placed in a supine position, given a general endotracheal anesthesia, supervised by Dr. Arta Bruce in room #17.  A time-out was held in the surgical check list runner.    Abdomen was prepped with ChloraPrep and sterilely draped.  She has a mass of abdomen with abdominal wall hernia mass protruding as big as 25 cm in diameter.  First thing I  did was try a laparoscope, I placed an 11 mm Optiview in the left upper quadrant.  I placed about 4 additional 5 mm trocar in the left lateral abdomen, a 5 mm trocar in the right upper quadrant, a 5 mm trocar in the right mid quadrant, 5 mm trocar in the right lower quadrant.  I was able to reduce some of the small bowel which was not distended and soft from this hernia defect.  I was unable to reduce the main portion of the hernia, which was incarcerated transverse colon.  I spent about approximately 45 minutes trying to do a laparoscopic reduction.  I then did open incision excising the skin over the hernia.  Her umbilicus had a bunch of stuff in it.  The umbilicus was excised with the skin. I cut down to the abdominal wall fascia.  I then spent time mobilizing the colon for this sort of hernia.  I had to resect some of omentum, her skin, her sac.  I used this with a combination of the harmonic scalpel and a 2-0 Vicryl sutures and Bovie electrocautery.  After excising the sac, I could trace the transverse colon. The bowel was not compromised or damaged.  I had already reduced the small bowel, so reduced the  colon back into the abdominal cavity.  This left an approximately 6 x 9 cm fascial defect.  The hernia appeared amenable to #1 primary closure with a transverse incision and what I did was used the thick MTS mesh that measured 20 x 14 cm.  I placed 9 retention sutures of which 8 were circumferential and 1 in the center and placed this into the abdominal cavity.  I tagged one on the left side, this as a marker.  Then I closed the midline incision with interrupted #1 Novafil sutures.  I then reintroduced the laparoscope, retrieved the sutures, withheld the mesh up anteriorly.  I placed the Protac and placed 37 staples or tags and I placed 3 additional 0 Novafil through the inferior part of the of the mesh to tack this using the introducer.  At this point, I reinspected the bowel, I  could see there was no bleeding, the mesh laid flat, photos were taken.  I deflated the abdomen one-time.  It showed no obvious defect which I could not find or feel.  So I removed the trocars and completed the laparoscopic portion of the operation.  I then placed two 19 Blake drains through the lower part of the incision.  I closed the soft tissues in the midline with a interrupted 2- 0 Vicryl sutures, the skin with skin staples and 2-0 nylon sutures.    The patient tolerated the procedure well.  Sponge and needle counts were correct at the end of the case.  We spent probably an hour and a half to 2 hours doing lysis of adhesions and mobilizing this hernia just to get it fixed and had difficulty with the operation.   Sandria Bales. Ezzard Standing, M.D., FACS   DHN/MEDQ  D:  07/03/2011  T:  07/04/2011  Job:  [id number]  cc:   Dala Dock  Electronically Signed by Ovidio Kin M.D. on 07/17/2011 12:45:12 PM

## 2011-07-18 LAB — GLUCOSE, CAPILLARY
Glucose-Capillary: 141 mg/dL — ABNORMAL HIGH (ref 70–99)
Glucose-Capillary: 113 mg/dL — ABNORMAL HIGH (ref 70–99)

## 2011-07-20 ENCOUNTER — Telehealth (INDEPENDENT_AMBULATORY_CARE_PROVIDER_SITE_OTHER): Payer: Self-pay

## 2011-07-20 NOTE — Telephone Encounter (Signed)
C/o  Feet and leg swelling since hernia repair 07/03/2011. Follow up set for 08/10/2011. There has been no increase in swelling but still concerned.   She has no kidney problems, no heart problems- take blood pressure medicine- voiding with no problems.  Problem reported to [facility], Dr. Ezzard Standing paged.  Per Dr. Ezzard Standing check to see who primary doctor is "Dr. Andrey Campanile a  [facility]"

## 2011-07-24 ENCOUNTER — Telehealth (INDEPENDENT_AMBULATORY_CARE_PROVIDER_SITE_OTHER): Payer: Self-pay | Admitting: General Surgery

## 2011-07-24 NOTE — Telephone Encounter (Signed)
Patient called today has been having some leg swelling on -off since surgery . Got better but swollen again today// pt had hernia repair by dr newman// after consult with dr Ezzard Standing pt was instructed to see medical doctor or go to emergency room. Pt states hernia repair looking good/ no fever /no n/v/  [id number] eh

## 2011-07-25 NOTE — Discharge Summary (Signed)
[name], [name]                ACCOUNT NO.:  [id number]  MEDICAL RECORD NO.:  [id number]  LOCATION:  5155                         FACILITY:  [facility]  PHYSICIAN:  Maisie Fus A. Mal Asher, M.D.DATE OF BIRTH:  November 27, 1951  DATE OF ADMISSION:  07/02/2011 DATE OF DISCHARGE:  07/18/2011                              DISCHARGE SUMMARY   HISTORY OF PRESENT ILLNESS:  Ms. Tempesta is a 60 year old female who had a history of a large ventral hernia for a long time, just never been able to get it repaired.  She began having abdominal pain, nausea and vomiting associated with it.  She presented with elevated white blood cell count and a CT scan showing evidence of a significantly large ventral hernia containing both large and small bowel extending inferiorly into her pannus with evidence of obstructive changes noted in the hernia.  Decision was made that the patient would need to be admitted for operative management.  SUMMARY OF HOSPITAL COURSE:  The patient was admitted on July 02, 2011. The patient's comorbidities including obesity and diabetes were addressed prior to decision for surgical intervention.  However, the patient was taken to the operating room on July 03, 2011, underwent laparoscopic-assisted ventral hernia repair with primary closure and underlay MTF mesh implant.  She tolerated the procedure well and was admitted to the floor in a stable condition.  As expected, significant ileus was encountered which carried on for quite a bit of time.  The patient's improvement seem to wax and wane.  She demonstrated several days of mild improvement but then had setbacks of nausea and actually on several occasions vomiting and actually the patient did required an NG tube at one point that lasted for several days which was eventually discontinued.  Since that point, the patient's diet has been advanced. She is currently tolerating regular diet as of postop day #11 and has done well.  Both of her  drains have been removed.  Her staples and sutures have been removed from her incision.  She demonstrates no evidence of wound infection or other additional complications. Motivation and mobility have been a little bit of a factor for her due to her weight and discomfort but gradually she has an increase in her activity and her pain is well-controlled on oral pain medications.  As of today, postop day #14 or so, we feel the patient is appropriately stable for discharge home.  DISCHARGE DIAGNOSES: 1. Incarcerated ventral hernia with partial small bowel obstruction -     status post laparoscopic-assisted ventral hernia repair with     reduction of obstruction. 2. Postoperative ileus - resolved. 3. Diabetes mellitus - stable. 4. Hypertension - stable.  DISCHARGE MEDICATIONS:  The patient will resume home medications including: 1. Protonix 40 mg daily. 2. Multivitamin once daily. 3. Metformin 1000 mg 1 tablet twice daily. 4. Benazepril 40 mg daily. 5. Aspirin 81 mg daily. 6. She will be given a prescription for Vicodin 5/325 one-two tablets     q.4 h. p.r.n. pain.  DISCHARGE INSTRUCTIONS:  She is given preprinted discharge instructions. She is asked to follow up with Dr. Ovidio Kin is [facility] in the next 2 weeks' time as  well as with her primary care physician for ongoing management of her hypertension and diabetes.  She needs to call our office with any questions or concerns such as high fever, increased pain, nausea, vomiting, or problems with her wounds at any time.     Brayton El, PA-C   ______________________________ Clovis Pu Mykell Rawl, M.D.    Corky Downs  D:  07/18/2011  T:  07/18/2011  Job:  [id number]  Electronically Signed by Brayton El  on 07/24/2011 03:01:14 PM Electronically Signed by Harriette Bouillon M.D. on 07/25/2011 08:07:28 AM

## 2011-07-26 ENCOUNTER — Inpatient Hospital Stay (INDEPENDENT_AMBULATORY_CARE_PROVIDER_SITE_OTHER)
Admission: RE | Admit: 2011-07-26 | Discharge: 2011-07-26 | Disposition: A | Discharge status: Home / Self Care | Payer: Self-pay | Source: Ambulatory Visit | Attending: Emergency Medicine | Admitting: Emergency Medicine

## 2011-07-26 DIAGNOSIS — R439 Unspecified disturbances of smell and taste: Secondary | ICD-10-CM

## 2011-07-26 DIAGNOSIS — R609 Edema, unspecified: Secondary | ICD-10-CM

## 2011-08-02 ENCOUNTER — Emergency Department (HOSPITAL_COMMUNITY): Payer: Self-pay

## 2011-08-02 ENCOUNTER — Emergency Department (HOSPITAL_COMMUNITY)
Admission: EM | Admit: 2011-08-02 | Discharge: 2011-08-02 | Disposition: A | Discharge status: Home / Self Care | Payer: Self-pay | Attending: Emergency Medicine | Admitting: Emergency Medicine

## 2011-08-02 ENCOUNTER — Encounter (HOSPITAL_COMMUNITY): Payer: Self-pay | Admitting: Radiology

## 2011-08-02 DIAGNOSIS — R000 Tachycardia, unspecified: Secondary | ICD-10-CM | POA: Insufficient documentation

## 2011-08-02 DIAGNOSIS — R609 Edema, unspecified: Secondary | ICD-10-CM | POA: Insufficient documentation

## 2011-08-02 DIAGNOSIS — E119 Type 2 diabetes mellitus without complications: Secondary | ICD-10-CM | POA: Insufficient documentation

## 2011-08-02 DIAGNOSIS — Z79899 Other long term (current) drug therapy: Secondary | ICD-10-CM | POA: Insufficient documentation

## 2011-08-02 DIAGNOSIS — R0989 Other specified symptoms and signs involving the circulatory and respiratory systems: Secondary | ICD-10-CM | POA: Insufficient documentation

## 2011-08-02 DIAGNOSIS — M7989 Other specified soft tissue disorders: Secondary | ICD-10-CM | POA: Insufficient documentation

## 2011-08-02 DIAGNOSIS — R0602 Shortness of breath: Secondary | ICD-10-CM | POA: Insufficient documentation

## 2011-08-02 DIAGNOSIS — R0609 Other forms of dyspnea: Secondary | ICD-10-CM | POA: Insufficient documentation

## 2011-08-02 DIAGNOSIS — I10 Essential (primary) hypertension: Secondary | ICD-10-CM | POA: Insufficient documentation

## 2011-08-02 HISTORY — DX: Essential (primary) hypertension: I10

## 2011-08-02 LAB — CBC
HCT: 32.7 % — ABNORMAL LOW (ref 36.0–46.0)
Hemoglobin: 9.8 g/dL — ABNORMAL LOW (ref 12.0–15.0)
MCH: 28.4 pg (ref 26.0–34.0)
MCHC: 30 g/dL (ref 30.0–36.0)
MCV: 94.8 fL (ref 78.0–100.0)
Platelets: 374 10*3/uL (ref 150–400)
RBC: 3.45 MIL/uL — ABNORMAL LOW (ref 3.87–5.11)
RDW: 14.1 % (ref 11.5–15.5)
WBC: 5.9 10*3/uL (ref 4.0–10.5)

## 2011-08-02 LAB — POCT I-STAT TROPONIN I: Troponin i, poc: 0.01 ng/mL (ref 0.00–0.08)

## 2011-08-02 LAB — DIFFERENTIAL
Basophils Absolute: 0 10*3/uL (ref 0.0–0.1)
Basophils Relative: 1 % (ref 0–1)
Eosinophils Absolute: 0.2 10*3/uL (ref 0.0–0.7)
Eosinophils Relative: 3 % (ref 0–5)
Lymphocytes Relative: 15 % (ref 12–46)
Lymphs Abs: 0.9 10*3/uL (ref 0.7–4.0)
Monocytes Absolute: 0.3 10*3/uL (ref 0.1–1.0)
Monocytes Relative: 5 % (ref 3–12)
Neutro Abs: 4.5 10*3/uL (ref 1.7–7.7)
Neutrophils Relative %: 76 % (ref 43–77)

## 2011-08-02 LAB — D-DIMER, QUANTITATIVE: D-Dimer, Quant: 1.34 ug/mL-FEU — ABNORMAL HIGH (ref 0.00–0.48)

## 2011-08-02 LAB — CK TOTAL AND CKMB (NOT AT ARMC)
CK, MB: 2.2 ng/mL (ref 0.3–4.0)
Relative Index: INVALID (ref 0.0–2.5)
Total CK: 29 U/L (ref 7–177)

## 2011-08-02 LAB — COMPREHENSIVE METABOLIC PANEL
ALT: 18 U/L (ref 0–35)
AST: 17 U/L (ref 0–37)
Albumin: 3.1 g/dL — ABNORMAL LOW (ref 3.5–5.2)
Alkaline Phosphatase: 82 U/L (ref 39–117)
BUN: 11 mg/dL (ref 6–23)
CO2: 28 mEq/L (ref 19–32)
Calcium: 9.1 mg/dL (ref 8.4–10.5)
Chloride: 103 mEq/L (ref 96–112)
Creatinine, Ser: 0.57 mg/dL (ref 0.50–1.10)
GFR calc Af Amer: >60 mL/min (ref >60)
GFR calc non Af Amer: >60 mL/min (ref >60)
Glucose, Bld: 165 mg/dL — ABNORMAL HIGH (ref 70–99)
Potassium: 3.7 mEq/L (ref 3.5–5.1)
Sodium: 141 mEq/L (ref 135–145)
Total Bilirubin: 0.2 mg/dL — ABNORMAL LOW (ref 0.3–1.2)
Total Protein: 7.2 g/dL (ref 6.0–8.3)

## 2011-08-02 LAB — PRO B NATRIURETIC PEPTIDE: Pro B Natriuretic peptide (BNP): 864.4 pg/mL — ABNORMAL HIGH (ref 0–125)

## 2011-08-02 MED ORDER — IOHEXOL 300 MG/ML  SOLN: 100.0000 mL | Freq: Once | INTRAMUSCULAR | Status: DC | PRN

## 2011-08-07 ENCOUNTER — Encounter (INDEPENDENT_AMBULATORY_CARE_PROVIDER_SITE_OTHER): Payer: Self-pay | Admitting: Surgery

## 2011-08-10 ENCOUNTER — Encounter (INDEPENDENT_AMBULATORY_CARE_PROVIDER_SITE_OTHER): Payer: Self-pay | Admitting: Surgery

## 2011-08-10 ENCOUNTER — Ambulatory Visit (INDEPENDENT_AMBULATORY_CARE_PROVIDER_SITE_OTHER): Payer: Self-pay | Admitting: Surgery

## 2011-08-10 VITALS — BP 154/96 | HR 100 | Temp 97.6°F | Ht 70.0 in | Wt 295.8 lb

## 2011-08-10 DIAGNOSIS — Z9889 Other specified postprocedural states: Secondary | ICD-10-CM

## 2011-08-10 DIAGNOSIS — Z8719 Personal history of other diseases of the digestive system: Secondary | ICD-10-CM

## 2011-08-10 NOTE — Progress Notes (Signed)
Chief Complaint  Patient presents with  . Other    PO abd hernia    [name] is a 60 y.o. (DOB: [birth year])  AA female who is a patient of WILSON,AMELIA, MD, MD and comes to me today for follow up of ventral hernia repair.  She was hospitalized from 07/02/2011 - 07/18/2011.  I repaired a large incarcerated ventral hernia on her on 07/03/2011.  She has been to the ER for leg edema.  She been back to [facility] to see Dr. Andrey Campanile.  Though slow moving and sore, she has done well from her abdominal hernia repair.  PHYSICAL EXAM: BP 154/96  Pulse 100  Temp(Src) 97.6 F (36.4 C) (Temporal)  Ht 5\' 10"  (1.778 m)  Wt 295 lb 12.8 oz (134.174 kg)  BMI 42.44 kg/m2  Abdomen:  I removed 2 sutures and one staple that had been left in place.  Her wound looks good.  Bowel sounds were normal.  There was no mass.  DATA REVIEWED: Path report given to patient  ASSESSMENT and PLAN: 1.  S/P ventral hernia repair  Her appetite is still not good.  She thinks she has lost 15 pounds since surgery.  Though the weight loss is good for her.  I think this will improve the further she gets out from surgery.  I made this her last visit with me.  2.  Morbid obesity. 3.  Peripheral edema, questionably secondary to heart disease.  She has an appt with Dr. Olene Floss for card eval. 4.  Diabetes Mellitus. 5.  Hypertension

## 2011-08-22 ENCOUNTER — Ambulatory Visit (INDEPENDENT_AMBULATORY_CARE_PROVIDER_SITE_OTHER): Payer: Self-pay | Admitting: Cardiovascular Disease

## 2011-08-22 ENCOUNTER — Encounter: Payer: Self-pay | Admitting: Cardiovascular Disease

## 2011-08-22 VITALS — BP 160/88 | HR 126 | Ht 71.0 in | Wt 297.0 lb

## 2011-08-22 DIAGNOSIS — R0602 Shortness of breath: Secondary | ICD-10-CM

## 2011-08-22 DIAGNOSIS — R600 Localized edema: Secondary | ICD-10-CM

## 2011-08-22 DIAGNOSIS — R609 Edema, unspecified: Secondary | ICD-10-CM | POA: Insufficient documentation

## 2011-08-22 DIAGNOSIS — R000 Tachycardia, unspecified: Secondary | ICD-10-CM

## 2011-08-22 HISTORY — DX: Tachycardia, unspecified: R00.0

## 2011-08-22 MED ORDER — POTASSIUM CHLORIDE ER 10 MEQ PO TBCR: 10.0000 meq | EXTENDED_RELEASE_TABLET | Freq: Two times a day (BID) | ORAL | Status: DC

## 2011-08-22 MED ORDER — FUROSEMIDE 40 MG PO TABS: 40.0000 mg | ORAL_TABLET | Freq: Every day | ORAL | Status: DC

## 2011-08-22 NOTE — Assessment & Plan Note (Signed)
Dependent edema bilateral lower extremities. I would like to get an echo to assess her LV size and  function. She is encouraged to reduce salt intake, wear compression stockings and elevate legs when sitting.

## 2011-08-22 NOTE — Patient Instructions (Signed)
Your physician has requested that you have an echocardiogram. Echocardiography is a painless test that uses sound waves to create images of your heart. It provides your doctor with information about the size and shape of your heart and how well your heart's chambers and valves are working. This procedure takes approximately one hour. There are no restrictions for this procedure.  Return to see Dr.McAlhany in 2 to 3 weeks

## 2011-08-22 NOTE — Assessment & Plan Note (Signed)
Sinus tachycardia. NO PE on CTA two weeks ago. ? Etiology of tachycardia. Will get echo to make sure there is no pericardial effusion. Will look through record for old thyroid studies.

## 2011-08-22 NOTE — Progress Notes (Signed)
History of Present Illness:59 yo AAF with history of HTN, DM here today to establish cardiac care. She has had recent complaints of bilateral lower extremity edema. She was seen in the [facility] ED three weeks ago and was started on lasix for one week. She had hernia surgery July 03, 2011 and since then has had bilateral lower extremity swelling since then. Both legs are swollen. The swelling gets better at night. She has never had a heart problem. CTA chest August 2012 in the ED with no evidence of PE.   Past Medical History  Diagnosis Date  . Diabetes mellitus   . Hypertension   . Obesity   . Abdominal hernia   . Arthritis     knees  . Edema     legs    Past Surgical History  Procedure Date  . Hernia repair 07/03/2011    abd hernia  . Ankle fracture surgery 1998?    right  . Nose surgery     Current Outpatient Prescriptions  Medication Sig Dispense Refill  . aspirin 81 MG tablet Take 81 mg by mouth daily.        . benazepril (LOTENSIN) 40 MG tablet Take 40 mg by mouth daily.        . metFORMIN (GLUCOPHAGE) 1000 MG tablet Take 1,000 mg by mouth 2 (two) times daily with a meal.        . Multiple Vitamin (MULTIVITAMIN) tablet Take 1 tablet by mouth daily.        . naproxen sodium (ANAPROX) 220 MG tablet Take 220 mg by mouth 2 (two) times daily with a meal.        . pantoprazole (PROTONIX) 40 MG tablet Take 40 mg by mouth daily.          Allergies  Allergen Reactions  . Sulfa Drugs Cross Reactors     History   Social History  . Marital Status: Single    Spouse Name: N/A    Number of Children: N/A  . Years of Education: N/A   Occupational History  . Not on file.   Social History Main Topics  . Smoking status: Never Smoker   . Smokeless tobacco: Not on file  . Alcohol Use: No  . Drug Use: No  . Sexually Active:    Other Topics Concern  . Not on file   Social History Narrative  . No narrative on file    Family History  Problem Relation Age of Onset  .  Hypertension Mother   . Kidney disease Mother   . Diabetes Father   . Hypertension Father   . Diabetes Sister   . Hypertension Sister   . Diabetes Brother   . Hypertension Brother   . Heart disease Brother     heart attack  . Hypertension Sister     Review of Systems:  As stated in the HPI and otherwise negative.   BP 160/88  Pulse 126  Ht 5\' 11"  (1.803 m)  Wt 297 lb (134.718 kg)  BMI 41.42 kg/m2  Physical Examination: General: Well developed, well nourished, NAD HEENT: OP clear, mucus membranes moist SKIN: warm, dry. No rashes. Neuro: No focal deficits Musculoskeletal: Muscle strength 5/5 all ext Psychiatric: Mood and affect normal Neck: No JVD, no carotid bruits, no thyromegaly, no lymphadenopathy. Lungs:Clear bilaterally, no wheezes, rhonci, crackles Cardiovascular: Regular rate and rhythm. No murmurs, gallops or rubs. Abdomen:Soft. Bowel sounds present. Non-tender.  Extremities: No lower extremity edema. Pulses are 2 + in  the bilateral DP/PT.

## 2011-08-23 ENCOUNTER — Telehealth: Payer: Self-pay | Admitting: Cardiovascular Disease

## 2011-08-23 NOTE — Telephone Encounter (Signed)
The cost of potassium is too high. Need something cheaper.

## 2011-08-23 NOTE — Telephone Encounter (Signed)
I talked with [name] at [facility].  The prescription for KCL is generic and they do not have a less expensive alternative for pt. LMTCB for pt.

## 2011-08-25 NOTE — Telephone Encounter (Signed)
Pt calling to see if another pharmacy offers potassium cheaper. Pt mentioned [facility]. Please return pt call to discuss further.

## 2011-08-28 NOTE — Telephone Encounter (Signed)
LIne busy times two. Will try again.

## 2011-08-29 NOTE — Telephone Encounter (Signed)
Busy

## 2011-08-29 NOTE — Telephone Encounter (Signed)
PHONE LINE REMAINS BUSY./CY

## 2011-08-30 ENCOUNTER — Ambulatory Visit (HOSPITAL_COMMUNITY): Payer: Self-pay | Attending: Cardiovascular Disease | Admitting: Radiology

## 2011-08-30 DIAGNOSIS — M7989 Other specified soft tissue disorders: Secondary | ICD-10-CM | POA: Insufficient documentation

## 2011-08-30 DIAGNOSIS — R000 Tachycardia, unspecified: Secondary | ICD-10-CM | POA: Insufficient documentation

## 2011-08-30 DIAGNOSIS — R600 Localized edema: Secondary | ICD-10-CM

## 2011-08-30 DIAGNOSIS — E119 Type 2 diabetes mellitus without complications: Secondary | ICD-10-CM | POA: Insufficient documentation

## 2011-08-30 DIAGNOSIS — I10 Essential (primary) hypertension: Secondary | ICD-10-CM | POA: Insufficient documentation

## 2011-08-30 DIAGNOSIS — R0989 Other specified symptoms and signs involving the circulatory and respiratory systems: Secondary | ICD-10-CM | POA: Insufficient documentation

## 2011-08-30 DIAGNOSIS — R0609 Other forms of dyspnea: Secondary | ICD-10-CM | POA: Insufficient documentation

## 2011-08-30 DIAGNOSIS — R609 Edema, unspecified: Secondary | ICD-10-CM

## 2011-08-30 DIAGNOSIS — R0602 Shortness of breath: Secondary | ICD-10-CM

## 2011-08-30 NOTE — Telephone Encounter (Signed)
Number continues to be busy. No other number listed for pt.

## 2011-08-31 NOTE — Telephone Encounter (Signed)
Continue to get busy signal when calling number listed for pt.

## 2011-09-05 NOTE — Telephone Encounter (Signed)
Continue to get busy signal. Pt has follow up appt with Dr. Clifton James scheduled for Sept. 24, 2012

## 2011-09-11 ENCOUNTER — Ambulatory Visit (INDEPENDENT_AMBULATORY_CARE_PROVIDER_SITE_OTHER): Payer: Self-pay | Admitting: Cardiovascular Disease

## 2011-09-11 ENCOUNTER — Encounter: Payer: Self-pay | Admitting: Cardiovascular Disease

## 2011-09-11 VITALS — BP 130/80 | HR 115 | Resp 18 | Ht 70.0 in | Wt 289.8 lb

## 2011-09-11 DIAGNOSIS — R609 Edema, unspecified: Secondary | ICD-10-CM

## 2011-09-11 DIAGNOSIS — R600 Localized edema: Secondary | ICD-10-CM

## 2011-09-11 MED ORDER — FUROSEMIDE 40 MG PO TABS: ORAL_TABLET | ORAL | Status: DC

## 2011-09-11 NOTE — Assessment & Plan Note (Signed)
Likely dependent edema. Low suspicion for DVT but with recent elevated d-dimer, will get bilateral venous dopplers. Recent surgery as well. Continue Lasix but will increase to 40 mg alternating with 40 mg BID. Wear stockings if necessary, elevate legs.

## 2011-09-11 NOTE — Progress Notes (Signed)
History of Present Illness:60 yo AAF with history of HTN, DM here today for follow up. I saw her as a new patient 08/22/11 for evaluation of LE edema.  She was seen in the [facility] ED in August 2012  and was started on lasix for one week. She had hernia surgery July 03, 2011 and since then has had bilateral lower extremity swelling since then. Both legs are swollen. The swelling gets better at night. She has never had a heart problem. CTA chest August 2012 in the ED with no evidence of PE. I ordered an echo which showed moderate LVH with low normal LV function, EF of 50%, mild RV dysfunction. No valvular issues.  She continues to have bilateral lower ext swelling during the day, left leg worse than right leg. No chest pain or SOB. She has been taking Lasix 40 mg po daily but no potassium as this is too expensive. She has been eating a banana daily. Swelling in both legs resolves at night. D-dimer positive in the hospital, CTA negative for PE but no venous dopplers done.    Past Medical History  Diagnosis Date  . Diabetes mellitus   . Hypertension   . Obesity   . Abdominal hernia   . Arthritis     knees  . Edema     legs    Past Surgical History  Procedure Date  . Hernia repair 07/03/2011    abd hernia  . Ankle fracture surgery 1998?    right  . Nose surgery     Current Outpatient Prescriptions  Medication Sig Dispense Refill  . aspirin 81 MG tablet Take 81 mg by mouth daily.        . benazepril (LOTENSIN) 40 MG tablet Take 40 mg by mouth daily.        . furosemide (LASIX) 40 MG tablet Take 1 tablet (40 mg total) by mouth daily.  30 tablet  11  . ibuprofen (ADVIL,MOTRIN) 200 MG tablet Take 200 mg by mouth every 6 (six) hours as needed.        . metFORMIN (GLUCOPHAGE) 1000 MG tablet Take 1,000 mg by mouth 2 (two) times daily with a meal.        . Multiple Vitamin (MULTIVITAMIN) tablet Take 1 tablet by mouth daily.          Allergies  Allergen Reactions  . Sulfa Drugs Cross  Reactors     History   Social History  . Marital Status: Single    Spouse Name: N/A    Number of Children: 2  . Years of Education: N/A   Occupational History  . UNEMPLOYED    Social History Main Topics  . Smoking status: Never Smoker   . Smokeless tobacco: Not on file  . Alcohol Use: No  . Drug Use: No  . Sexually Active:    Other Topics Concern  . Not on file   Social History Narrative  . No narrative on file    Family History  Problem Relation Age of Onset  . Hypertension Mother   . Kidney disease Mother   . Diabetes Father   . Hypertension Father   . Diabetes Sister   . Hypertension Sister   . Diabetes Brother   . Hypertension Brother   . Heart disease Brother 56    heart attack  . Hypertension Sister     Review of Systems:  As stated in the HPI and otherwise negative.   BP 130/80  Pulse  115  Resp 18  Ht 5\' 10"  (1.778 m)  Wt 289 lb 12.8 oz (131.452 kg)  BMI 41.58 kg/m2  Physical Examination: General: Well developed, well nourished, NAD HEENT: OP clear, mucus membranes moist SKIN: warm, dry. No rashes. Neuro: No focal deficits Musculoskeletal: Muscle strength 5/5 all ext Psychiatric: Mood and affect normal Neck: No JVD, no carotid bruits, no thyromegaly, no lymphadenopathy. Lungs:Clear bilaterally, no wheezes, rhonci, crackles Cardiovascular: Regular rate and rhythm. No murmurs, gallops or rubs. Abdomen:Soft. Bowel sounds present. Non-tender.  Extremities: 1+ bilateral  lower extremity edema. Pulses are 1-2 + in the bilateral DP/PT.  JXB:JYNWG tachycardia, rate 115 bpm.   Echo 08/30/11:  Left ventricle: The cavity size was normal. Wall thickness was increased in a pattern of moderate LVH. The estimated ejection fraction was 50%. Regional wall motion abnormalities cannot be excluded. Doppler parameters are consistent with abnormal left ventricular relaxation (grade 1 diastolic dysfunction). - Right ventricle: The cavity size was normal.  Systolic function was mildly reduced.

## 2011-09-11 NOTE — Patient Instructions (Signed)
Your physician recommends that you schedule a follow-up appointment in: 3-4 months.   Your physician has requested that you have a lower  extremity venous duplex. This test is an ultrasound of the veins in the legs or arms. It looks at venous blood flow that carries blood from the heart to the legs or arms. Allow one hour for a Lower Venous exam. Allow thirty minutes for an Upper Venous exam. There are no restrictions or special instructions.  Your physician has recommended you make the following change in your medication: Increase furosemide to 40 mg daily alternating with furosemide 40 mg twice daily.

## 2011-09-11 NOTE — Telephone Encounter (Signed)
Pt here for office visit with Dr.McAlhany. She states she has been having trouble with her phone. She discussed information below with MD at office visit.

## 2011-09-12 ENCOUNTER — Telehealth: Payer: Self-pay | Admitting: Cardiovascular Disease

## 2011-09-12 NOTE — Telephone Encounter (Signed)
Pt said that someone called her sister after she had left yesterday.  Not sure what the message was.  Please call back at [phone].  Leave message with sister if she is not there.

## 2011-09-12 NOTE — Telephone Encounter (Signed)
Merita Norton had called the patient to schedule a test

## 2011-09-13 ENCOUNTER — Telehealth: Payer: Self-pay | Admitting: Cardiovascular Disease

## 2011-09-13 NOTE — Telephone Encounter (Addendum)
Left message with Ms. Normand Sloop (patient sister), to have Ms. Kehl call the office to reschedule her Venus Duplex to sooner appointment date/time. Ms. Avery does not have working phone number at this time. I have been trying to reach the Ms. Vonderhaar since 09/11/11. She finally called back on 09/12/11, but, by the time I received the message Ms. Squitieri had left her sister house. Ms. Normand Sloop stated that her sister said she was confused because her appointment has already been scheduled for 10/02/11 @ 2 pm.  She will give her the message to call our office back.

## 2011-09-14 ENCOUNTER — Encounter (INDEPENDENT_AMBULATORY_CARE_PROVIDER_SITE_OTHER): Payer: Self-pay | Admitting: Cardiology

## 2011-09-14 DIAGNOSIS — R600 Localized edema: Secondary | ICD-10-CM

## 2011-09-14 DIAGNOSIS — R609 Edema, unspecified: Secondary | ICD-10-CM

## 2011-09-14 NOTE — Telephone Encounter (Signed)
Patient calling back . S/p with Jasmine December at [facility].transfer call .

## 2011-09-14 NOTE — Telephone Encounter (Signed)
Venous doppler done today

## 2011-09-25 LAB — DIFFERENTIAL
Basophils Absolute: 0
Basophils Relative: 0
Eosinophils Absolute: 0.2
Eosinophils Relative: 2
Lymphocytes Relative: 15
Lymphs Abs: 1.5
Monocytes Absolute: 0.3
Monocytes Relative: 3
Neutro Abs: 8.1 — ABNORMAL HIGH
Neutrophils Relative %: 80 — ABNORMAL HIGH

## 2011-09-25 LAB — BASIC METABOLIC PANEL
BUN: 13
BUN: 11
CO2: 28
CO2: 28
Calcium: 9.2
Calcium: 9.1
Chloride: 105
Chloride: 98
Creatinine, Ser: 0.77
Creatinine, Ser: 0.89
GFR calc Af Amer: >60
GFR calc Af Amer: >60
GFR calc non Af Amer: >60
GFR calc non Af Amer: >60
Glucose, Bld: 150 — ABNORMAL HIGH
Glucose, Bld: 206 — ABNORMAL HIGH
Potassium: 3.6
Potassium: 3.4 — ABNORMAL LOW
Sodium: 140
Sodium: 136

## 2011-09-25 LAB — PROTIME-INR
INR: 1
Prothrombin Time: 13.2

## 2011-09-25 LAB — CBC
HCT: 28.1 — ABNORMAL LOW
HCT: 24.5 — ABNORMAL LOW
HCT: 24.7 — ABNORMAL LOW
HCT: 25.7 — ABNORMAL LOW
Hemoglobin: 9.1 — ABNORMAL LOW
Hemoglobin: 8.2 — ABNORMAL LOW
Hemoglobin: 8.4 — ABNORMAL LOW
Hemoglobin: 8.5 — ABNORMAL LOW
MCHC: 32.3
MCHC: 33.2
MCHC: 33.3
MCHC: 33.8
MCV: 91.1
MCV: 88.9
MCV: 90.4
MCV: 91.1
Platelets: 348
Platelets: 306
Platelets: 375
Platelets: 404 — ABNORMAL HIGH
RBC: 3.08 — ABNORMAL LOW
RBC: 2.72 — ABNORMAL LOW
RBC: 2.78 — ABNORMAL LOW
RBC: 2.82 — ABNORMAL LOW
RDW: 13.8
RDW: 14.5
RDW: 14.6
RDW: 14.6
WBC: 10.9 — ABNORMAL HIGH
WBC: 10.1
WBC: 8.6
WBC: 8.7

## 2011-09-25 LAB — APTT: aPTT: 26

## 2011-09-26 LAB — CBC
HCT: 35.6 — ABNORMAL LOW
HCT: 37.4
HCT: 33.3 — ABNORMAL LOW
Hemoglobin: 11.8 — ABNORMAL LOW
Hemoglobin: 12.1
Hemoglobin: 11 — ABNORMAL LOW
MCHC: 32.5
MCHC: 33.1
MCHC: 33
MCV: 89.9
MCV: 91.2
MCV: 90.4
Platelets: 339
Platelets: 381
Platelets: 368
RBC: 3.96
RBC: 4.1
RBC: 3.68 — ABNORMAL LOW
RDW: 13.9
RDW: 14.4
RDW: 13
WBC: 10.5
WBC: 9.8
WBC: 13.3 — ABNORMAL HIGH

## 2011-09-26 LAB — DIFFERENTIAL
Basophils Absolute: 0
Basophils Absolute: 0
Basophils Relative: 1
Basophils Relative: 0
Eosinophils Absolute: 0.2
Eosinophils Absolute: 0.2
Eosinophils Relative: 2
Eosinophils Relative: 1
Lymphocytes Relative: 17
Lymphocytes Relative: 12
Lymphs Abs: 1.6
Lymphs Abs: 1.6
Monocytes Absolute: 0.4
Monocytes Absolute: 0.3
Monocytes Relative: 4
Monocytes Relative: 2 — ABNORMAL LOW
Neutro Abs: 7.5
Neutro Abs: 11.2 — ABNORMAL HIGH
Neutrophils Relative %: 77
Neutrophils Relative %: 84 — ABNORMAL HIGH

## 2011-09-26 LAB — BASIC METABOLIC PANEL
BUN: 16
CO2: 24
Calcium: 9.4
Chloride: 105
Creatinine, Ser: 0.83
GFR calc Af Amer: >60
GFR calc non Af Amer: >60
Glucose, Bld: 198 — ABNORMAL HIGH
Potassium: 3.6
Sodium: 139

## 2011-09-26 LAB — SAMPLE TO BLOOD BANK

## 2011-10-02 ENCOUNTER — Encounter: Payer: Self-pay | Admitting: Cardiology

## 2011-11-27 ENCOUNTER — Ambulatory Visit (INDEPENDENT_AMBULATORY_CARE_PROVIDER_SITE_OTHER): Payer: Self-pay | Admitting: Cardiovascular Disease

## 2011-11-27 ENCOUNTER — Encounter: Payer: Self-pay | Admitting: Cardiovascular Disease

## 2011-11-27 VITALS — BP 131/79 | HR 80 | Ht 70.0 in | Wt 288.0 lb

## 2011-11-27 DIAGNOSIS — R609 Edema, unspecified: Secondary | ICD-10-CM

## 2011-11-27 NOTE — Assessment & Plan Note (Signed)
Edema is better on Lasix. This is likely dependent edema. Venous dopplers negative.  She will follow in primary care. Avoid salt intake, elevate legs. Wear compression stockings.

## 2011-11-27 NOTE — Progress Notes (Signed)
History of Present Illness:60 yo AAF with history of HTN, DM here today for follow up. I saw her as a new patient 08/22/11 for evaluation of LE edema. She was seen in the [facility] ED in August 2012 and was started on lasix for one week. She had hernia surgery July 03, 2011 and since then has had bilateral lower extremity swelling since then. Both legs are swollen. The swelling gets better at night. She has never had a heart problem. CTA chest August 2012 in the ED with no evidence of PE. I ordered an echo which showed moderate LVH with low normal LV function, EF of 50%, mild RV dysfunction. No valvular issues. I saw her last in September 2012.   She continues to have bilateral lower ext swelling during the day, left leg worse than right leg. No chest pain or SOB. She has been taking Lasix 40 mg po daily alternating with BID. She has been eating a banana daily. Swelling in both legs resolves at night. D-dimer positive in the hospital, CTA negative for PE but no venous dopplers done. Venous dopplers here are negative.   Her primary care is in [facility].    Past Medical History  Diagnosis Date  . Diabetes mellitus   . Hypertension   . Obesity   . Abdominal hernia   . Arthritis     knees  . Edema     legs    Past Surgical History  Procedure Date  . Hernia repair 07/03/2011    abd hernia  . Ankle fracture surgery 1998?    right  . Nose surgery     Current Outpatient Prescriptions  Medication Sig Dispense Refill  . aspirin 81 MG tablet Take 81 mg by mouth daily.        . benazepril (LOTENSIN) 40 MG tablet Take 40 mg by mouth daily.        . furosemide (LASIX) 40 MG tablet Take one tablet daily alternating with one tablet twice daily  45 tablet  11  . ibuprofen (ADVIL,MOTRIN) 200 MG tablet Take 200 mg by mouth every 6 (six) hours as needed.        . metFORMIN (GLUCOPHAGE) 1000 MG tablet Take 1,000 mg by mouth 2 (two) times daily with a meal.        . metroNIDAZOLE (FLAGYL) 500 MG  tablet 1 tab daily  Until gone for itching       . Multiple Vitamin (MULTIVITAMIN) tablet Take 1 tablet by mouth daily.        . traMADol (ULTRAM) 50 MG tablet Take 50 mg by mouth every 6 (six) hours as needed. Maximum dose= 8 tablets per day         Allergies  Allergen Reactions  . Sulfa Drugs Cross Reactors     History   Social History  . Marital Status: Single    Spouse Name: N/A    Number of Children: 2  . Years of Education: N/A   Occupational History  . UNEMPLOYED    Social History Main Topics  . Smoking status: Never Smoker   . Smokeless tobacco: Not on file  . Alcohol Use: No  . Drug Use: No  . Sexually Active:    Other Topics Concern  . Not on file   Social History Narrative  . No narrative on file    Family History  Problem Relation Age of Onset  . Hypertension Mother   . Kidney disease Mother   .  Diabetes Father   . Hypertension Father   . Diabetes Sister   . Hypertension Sister   . Diabetes Brother   . Hypertension Brother   . Heart disease Brother 56    heart attack  . Hypertension Sister     Review of Systems:  As stated in the HPI and otherwise negative.   BP 131/79  Pulse 80  Ht 5\' 10"  (1.778 m)  Wt 288 lb (130.636 kg)  BMI 41.32 kg/m2  Physical Examination: General: Well developed, well nourished, NAD HEENT: OP clear, mucus membranes moist SKIN: warm, dry. No rashes. Neuro: No focal deficits Musculoskeletal: Muscle strength 5/5 all ext Psychiatric: Mood and affect normal Neck: No JVD, no carotid bruits, no thyromegaly, no lymphadenopathy. Lungs:Clear bilaterally, no wheezes, rhonci, crackles Cardiovascular: Regular rate and rhythm. No murmurs, gallops or rubs. Abdomen:Soft. Bowel sounds present. Non-tender.  Extremities: No lower extremity edema. Pulses are 2 + in the bilateral DP/PT.

## 2011-11-27 NOTE — Patient Instructions (Signed)
Your physician recommends that you schedule a follow-up appointment as needed  

## 2012-02-06 ENCOUNTER — Encounter: Payer: Self-pay | Admitting: Gastroenterology

## 2012-02-26 ENCOUNTER — Telehealth: Payer: Self-pay | Admitting: Cardiovascular Disease

## 2012-02-26 NOTE — Telephone Encounter (Signed)
Pt came In signed ROI 02/19/12, asking for all Records from 2012-Present, Records are copied [facility] for Pt 02/26/12/KM

## 2012-03-04 ENCOUNTER — Other Ambulatory Visit (HOSPITAL_COMMUNITY): Payer: Self-pay | Admitting: Family Medicine

## 2012-03-04 DIAGNOSIS — R52 Pain, unspecified: Secondary | ICD-10-CM

## 2012-03-04 DIAGNOSIS — IMO0002 Reserved for concepts with insufficient information to code with codable children: Secondary | ICD-10-CM

## 2012-03-06 ENCOUNTER — Telehealth (INDEPENDENT_AMBULATORY_CARE_PROVIDER_SITE_OTHER): Payer: Self-pay | Admitting: General Surgery

## 2012-03-06 NOTE — Telephone Encounter (Signed)
Pt calling to ask for appt with Dr. Ezzard Standing for new lt sided pain, described as sharp and burning.   Onset was approximately on week ago, that comes and goes.  She considered and treated herself for gas, without resolution of the pain.  Pt denies fever or bulging at the site.  Recommended she call her PCP, as appt with Dr. Ezzard Standing would not be immediate, but she is in process of treating a problem with carpel tunnel and does not want to go back to PCP at this time. I continued to recommend calling her PCP with this problem, but she remains reluctant.

## 2012-03-07 ENCOUNTER — Ambulatory Visit (HOSPITAL_COMMUNITY)
Admission: RE | Admit: 2012-03-07 | Discharge: 2012-03-07 | Disposition: A | Discharge status: Home / Self Care | Payer: Self-pay | Source: Ambulatory Visit | Attending: Family Medicine | Admitting: Family Medicine

## 2012-03-07 ENCOUNTER — Telehealth (INDEPENDENT_AMBULATORY_CARE_PROVIDER_SITE_OTHER): Payer: Self-pay | Admitting: General Surgery

## 2012-03-07 ENCOUNTER — Encounter (HOSPITAL_COMMUNITY): Payer: Self-pay

## 2012-03-07 DIAGNOSIS — R52 Pain, unspecified: Secondary | ICD-10-CM | POA: Insufficient documentation

## 2012-03-07 DIAGNOSIS — R229 Localized swelling, mass and lump, unspecified: Secondary | ICD-10-CM | POA: Insufficient documentation

## 2012-03-07 DIAGNOSIS — D1779 Benign lipomatous neoplasm of other sites: Secondary | ICD-10-CM | POA: Insufficient documentation

## 2012-03-07 DIAGNOSIS — IMO0002 Reserved for concepts with insufficient information to code with codable children: Secondary | ICD-10-CM

## 2012-03-07 NOTE — Telephone Encounter (Signed)
Called pt to schedule appt:  04/25/12 for new Lt-sided pain.  She understands she may be contacted to move up appt.

## 2012-03-28 ENCOUNTER — Other Ambulatory Visit (HOSPITAL_COMMUNITY): Payer: Self-pay | Admitting: Family Medicine

## 2012-03-28 DIAGNOSIS — Z1231 Encounter for screening mammogram for malignant neoplasm of breast: Secondary | ICD-10-CM

## 2012-03-29 ENCOUNTER — Telehealth (INDEPENDENT_AMBULATORY_CARE_PROVIDER_SITE_OTHER): Payer: Self-pay

## 2012-03-29 NOTE — Telephone Encounter (Signed)
I called the pt and gave her a sooner appt for 04/03/12 to evaluate her pain.

## 2012-04-03 ENCOUNTER — Other Ambulatory Visit (INDEPENDENT_AMBULATORY_CARE_PROVIDER_SITE_OTHER): Payer: Self-pay | Admitting: Surgery

## 2012-04-03 ENCOUNTER — Ambulatory Visit (INDEPENDENT_AMBULATORY_CARE_PROVIDER_SITE_OTHER): Payer: PRIVATE HEALTH INSURANCE | Admitting: Surgery

## 2012-04-03 ENCOUNTER — Encounter (INDEPENDENT_AMBULATORY_CARE_PROVIDER_SITE_OTHER): Payer: Self-pay | Admitting: Surgery

## 2012-04-03 VITALS — BP 152/94 | HR 74 | Temp 97.6°F | Resp 18 | Ht 70.0 in | Wt 286.4 lb

## 2012-04-03 DIAGNOSIS — R109 Unspecified abdominal pain: Secondary | ICD-10-CM | POA: Insufficient documentation

## 2012-04-03 DIAGNOSIS — K469 Unspecified abdominal hernia without obstruction or gangrene: Secondary | ICD-10-CM

## 2012-04-03 NOTE — Progress Notes (Addendum)
[facility]  Ovidio Kin, MD,  FACS [address]  [address] [place], [place]    [address] Phone:  [phone] FAX:  [phone]   Re:   [name] DOB:   04/09/1951 MRN:   [id number]  ASSESSMENT AND PLAN: 1.  S/P ventral hernia repair with underlay MTF mesh - 07/02/2011 - Dr. Algis Downs. Braelynn Lupton.  1.1  Left sided abdominal pain.  This seems unrelated to hernia repair.  Will plan CT scan of abdomen/pelvis, though I think this is low yield.  Will talk to patient after CT scan on results.  Return to here is PRN.   [CT scan 04/05/2012 - small midline hernias per radiology, though I am not totally sure they are hernias..  I would not touch these. I think there is an over-read regarding "internal hernia". Lipoma right colon. Tried to call patient, NA.  DN 04/10/2012] [Talked to patient by phone.  I reviewed the CT scan finding and suggested nothing further now.  She said an abdominal support has helped.  And she has been exercising, so this may be an abdominal wall strain.  DN 04/11/2012]  2.  Morbid obesity.  Weight 286, BMI 41.09  She may be down 8 pounds from last summer. 3.  History of peripheral edema, questionably secondary to heart disease.  She sees Dr. Olene Floss for card eval. 4.  Diabetes Mellitus. 5.  Hypertension. 6.  Seeking disability. 7.  Has episodic dizziness. 8.  Bilateral carpel tunnel syndrome.  HISTORY OF PRESENT ILLNESS: Chief Complaint  Patient presents with  . Pain    S/P hernia repair    [name] is a 61 y.o. (DOB: [birth year])  AA female who is a patient of WILSON,AMELIA, MD, MD and comes to me today for left sided abdominal pain.  The patient has had left-sided abdominal pain for about 6-8 weeks. She thinks the pain has gotten worse in the last 2 weeks. She is not discuss this with her primary care doctor. She has no nausea or vomiting, no fever, no weight change, and no change in bowel habits.  She has done well from her urgent  hernia repair last summer. She denies known stomach, liver, or colon disease. She has never had a colonoscopy.  She comes by herself today. She is not married and lives by herself. She is seeking disability.  SOCIAL:  Not married.  Is not working.  Lives by self.  Is seeking disability.  PHYSICAL EXAM: BP 152/94  Pulse 74  Temp(Src) 97.6 F (36.4 C) (Temporal)  Resp 18  Ht 5\' 10"  (1.778 m)  Wt 286 lb 6 oz (129.899 kg)  BMI 41.09 kg/m2  General: AA who is alert. She is morbidly obese. HEENT: Normal. Pupils equal. Good dentition. Neck: Supple. No mass.  No thyroid mass.  Lymph Nodes:  No supraclavicular or cervical nodes. Lungs: Clear to auscultation and symmetric breath sounds. Heart:  RRR. No murmur or rub.  Abdomen: Soft. No mass. Well healed midline incision.  Pain in LUQ to left flank, but I can feel no mass or hernia.  Her weight limits her physical exam. Rectal: Not done. Extremities:  Right wrist in splint that the patient got.  She says she has bilateral carpel tunnel.  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 Neurologic:  Grossly intact to motor and sensory function. Psychiatric: Has normal mood and affect. Behavior is normal.   DATA REVIEWED: Notes in chart.  Onalee Hua  Lucia Gaskins, MD, [place] Office:  [phone]

## 2012-04-04 ENCOUNTER — Other Ambulatory Visit (INDEPENDENT_AMBULATORY_CARE_PROVIDER_SITE_OTHER): Payer: Self-pay | Admitting: Surgery

## 2012-04-04 DIAGNOSIS — K469 Unspecified abdominal hernia without obstruction or gangrene: Secondary | ICD-10-CM

## 2012-04-04 LAB — CREATININE, SERUM: Creat: 0.7 mg/dL (ref 0.50–1.10)

## 2012-04-04 LAB — BUN: BUN: 16 mg/dL (ref 6–23)

## 2012-04-05 ENCOUNTER — Ambulatory Visit
Admission: RE | Admit: 2012-04-05 | Discharge: 2012-04-05 | Disposition: A | Discharge status: Home / Self Care | Payer: No Typology Code available for payment source | Source: Ambulatory Visit | Attending: Surgery | Admitting: Surgery

## 2012-04-05 ENCOUNTER — Inpatient Hospital Stay: Admission: RE | Admit: 2012-04-05 | Payer: Self-pay | Source: Ambulatory Visit

## 2012-04-05 DIAGNOSIS — K469 Unspecified abdominal hernia without obstruction or gangrene: Secondary | ICD-10-CM

## 2012-04-05 MED ORDER — IOHEXOL 300 MG/ML  SOLN
125.0000 mL | Freq: Once | INTRAMUSCULAR | Status: AC | PRN
  Administered 2012-04-05: 125 mL via INTRAVENOUS

## 2012-04-10 ENCOUNTER — Telehealth (INDEPENDENT_AMBULATORY_CARE_PROVIDER_SITE_OTHER): Payer: Self-pay | Admitting: General Surgery

## 2012-04-10 ENCOUNTER — Telehealth (INDEPENDENT_AMBULATORY_CARE_PROVIDER_SITE_OTHER): Payer: Self-pay

## 2012-04-10 NOTE — Telephone Encounter (Signed)
The patient called me back.  I informed her that after reviewing her ct Dr Ezzard Standing said their is nothing to explain her pain and she should follow up with her medical dr.  She does not understand what is wrong and what she should do.  She doesn't think her medical MD will know what to do.  I advised she can still come back in to see Dr Ezzard Standing but he doesn't have much to offer.  She asked if Dr Ezzard Standing has time tomorrow can he call her.  I will ask him.

## 2012-04-10 NOTE — Telephone Encounter (Signed)
Pt calling for CT results; informed that Dr. Ezzard Standing was to review with the radiologist and report would follow then.  She understands.

## 2012-04-24 ENCOUNTER — Ambulatory Visit (HOSPITAL_COMMUNITY)
Admission: RE | Admit: 2012-04-24 | Discharge: 2012-04-24 | Disposition: A | Discharge status: Home / Self Care | Payer: Self-pay | Source: Ambulatory Visit | Attending: Family Medicine | Admitting: Family Medicine

## 2012-04-24 DIAGNOSIS — Z1231 Encounter for screening mammogram for malignant neoplasm of breast: Secondary | ICD-10-CM | POA: Insufficient documentation

## 2012-04-25 ENCOUNTER — Ambulatory Visit (INDEPENDENT_AMBULATORY_CARE_PROVIDER_SITE_OTHER): Payer: Self-pay | Admitting: Surgery

## 2012-04-29 DIAGNOSIS — G5603 Carpal tunnel syndrome, bilateral upper limbs: Secondary | ICD-10-CM

## 2012-04-29 HISTORY — DX: Carpal tunnel syndrome, bilateral upper limbs: G56.03

## 2012-09-30 DIAGNOSIS — M189 Osteoarthritis of first carpometacarpal joint, unspecified: Secondary | ICD-10-CM | POA: Insufficient documentation

## 2012-09-30 DIAGNOSIS — D179 Benign lipomatous neoplasm, unspecified: Secondary | ICD-10-CM

## 2012-09-30 HISTORY — DX: Benign lipomatous neoplasm, unspecified: D17.9

## 2012-10-17 ENCOUNTER — Encounter (HOSPITAL_COMMUNITY): Payer: Self-pay | Admitting: Emergency Medicine

## 2012-10-17 ENCOUNTER — Emergency Department (INDEPENDENT_AMBULATORY_CARE_PROVIDER_SITE_OTHER)
Admission: EM | Admit: 2012-10-17 | Discharge: 2012-10-17 | Disposition: A | Discharge status: Home / Self Care | Payer: Self-pay | Source: Home / Self Care | Attending: Emergency Medicine | Admitting: Emergency Medicine

## 2012-10-17 DIAGNOSIS — M1990 Unspecified osteoarthritis, unspecified site: Secondary | ICD-10-CM

## 2012-10-17 DIAGNOSIS — M179 Osteoarthritis of knee, unspecified: Secondary | ICD-10-CM

## 2012-10-17 DIAGNOSIS — M1710 Unilateral primary osteoarthritis, unspecified knee: Secondary | ICD-10-CM

## 2012-10-17 DIAGNOSIS — IMO0002 Reserved for concepts with insufficient information to code with codable children: Secondary | ICD-10-CM

## 2012-10-17 MED ORDER — CAPSAICIN 0.1 % EX CREA: TOPICAL_CREAM | CUTANEOUS | Status: DC

## 2012-10-17 NOTE — ED Notes (Addendum)
Pt states that she has been having severe bilateral knee pain.  Recent dx osteoarthritis.   Former [facility] pt. Pt is taking tramadol with no relief of pain  Denies injury.

## 2012-10-17 NOTE — ED Provider Notes (Signed)
History     CSN: [id number]  Arrival date & time 10/17/12  1426   First MD Initiated Contact with Patient 10/17/12 1458      Chief Complaint  Patient presents with  . Knee Pain    bilateral knee pain x 1 wk. recent dx osteoarthritis. former [facility] pt.    (Consider location/radiation/quality/duration/timing/severity/associated sxs/prior treatment) HPI Comments: Patient presents urgent care to this afternoon complaining of ongoing bilateral anterior knee pain right greater than left she denies any recent falls injuries or increased physical activities. Denies any feeling of an unstable knee such as popping clicking or giving out sensations. She described that she was a patient of [facility] and she has been prescribed both meloxicam and tramadol for her knee pain. She describes her knees become swollen at times in the same way her feet. Denies any numbness, tingling sensation or weakness of either upper or lower extremities. Patient also denies any constitutional symptoms such as fevers, generalized malaise, change in appetite unintentional weight loss or further arthralgias or myalgias.  Patient is a 61 y.o. female presenting with knee pain.  Knee Pain This is a new problem. The problem occurs constantly. The problem has not changed since onset.The symptoms are aggravated by twisting and walking. The symptoms are relieved by rest. Treatments tried: Meloxicam and tramadol. The treatment provided mild relief.    Past Medical History  Diagnosis Date  . Diabetes mellitus   . Hypertension   . Obesity   . Abdominal hernia   . Arthritis     knees  . Edema     legs    Past Surgical History  Procedure Date  . Hernia repair 07/03/2011    abd hernia  . Ankle fracture surgery 1998?    right  . Nose surgery     Family History  Problem Relation Age of Onset  . Hypertension Mother   . Kidney disease Mother   . Diabetes Father   . Hypertension Father   . Diabetes Sister   .  Hypertension Sister   . Diabetes Brother   . Hypertension Brother   . Heart disease Brother 56    heart attack  . Hypertension Sister     History  Substance Use Topics  . Smoking status: Never Smoker   . Smokeless tobacco: Never Used  . Alcohol Use: No    OB History    Grav Para Term Preterm Abortions TAB SAB Ect Mult Living                  Review of Systems  Constitutional: Positive for activity change. Negative for fever, chills, diaphoresis and appetite change.  HENT: Negative for neck pain.   Musculoskeletal: Positive for gait problem. Negative for back pain, joint swelling and arthralgias.  Skin: Negative for color change, pallor, rash and wound.    Allergies  Sulfa drugs cross reactors  Home Medications   Current Outpatient Rx  Name Route Sig Dispense Refill  . ASPIRIN 81 MG PO TABS Oral Take 81 mg by mouth daily.      Marland Kitchen BENAZEPRIL HCL 40 MG PO TABS Oral Take 40 mg by mouth daily.      . IBUPROFEN 200 MG PO TABS Oral Take 200 mg by mouth every 6 (six) hours as needed.      Marland Kitchen METFORMIN HCL 1000 MG PO TABS Oral Take 1,000 mg by mouth 2 (two) times daily with a meal.      . METRONIDAZOLE 500 MG  PO TABS  1 tab daily  Until gone for itching     . ONE-DAILY MULTI VITAMINS PO TABS Oral Take 1 tablet by mouth daily.      . TRAMADOL HCL 50 MG PO TABS Oral Take 50 mg by mouth every 6 (six) hours as needed. Maximum dose= 8 tablets per day     . CAPSAICIN 0.1 % EX CREA  Apply bid x 10 days 43 Tube 0  . FUROSEMIDE 40 MG PO TABS  Take one tablet daily alternating with one tablet twice daily 45 tablet 11    BP 177/97  Pulse 82  Temp 98.3 F (36.8 C) (Oral)  Resp 20  SpO2 100%  Physical Exam  Nursing note and vitals reviewed. Constitutional: Vital signs are normal. She appears well-developed and well-nourished.  Non-toxic appearance. She does not have a sickly appearance. She does not appear ill. No distress.  Cardiovascular: Exam reveals no friction rub.     Musculoskeletal: She exhibits tenderness.       Right knee: She exhibits normal range of motion, no swelling, no effusion, no laceration, no erythema, normal alignment, no LCL laxity, normal patellar mobility, no bony tenderness, normal meniscus and no MCL laxity. tenderness found. Medial joint line, lateral joint line, MCL and LCL tenderness noted.       Legs: Neurological: She is alert.  Skin: Skin is warm. No rash noted. No erythema.    ED Course  Procedures (including critical care time)  Labs Reviewed - No data to display No results found.   1. Osteoarthritis   2. Degenerative arthritis of knee       MDM  Non-traumatic related bilateral knee pain exacerbation. Symptoms and physical exam were suggestive of advanced degenerative joint disease and osteoporotic exacerbation. Have prescribed patient in fasting as she describes she's taking meloxicam and tramadol for her chronic knee pain. Have also advised her to consult with an orthopedic doctor she agrees with both treatment and plan and follow-up care        Jimmie Molly, MD 10/17/12 [phone]

## 2012-11-04 ENCOUNTER — Encounter (HOSPITAL_COMMUNITY): Payer: Self-pay | Admitting: Emergency Medicine

## 2012-11-04 ENCOUNTER — Emergency Department (HOSPITAL_COMMUNITY)
Admission: EM | Admit: 2012-11-04 | Discharge: 2012-11-04 | Disposition: A | Discharge status: Home / Self Care | Payer: Medicare Other | Attending: Emergency Medicine | Admitting: Emergency Medicine

## 2012-11-04 ENCOUNTER — Emergency Department (HOSPITAL_COMMUNITY): Payer: Medicare Other

## 2012-11-04 DIAGNOSIS — J209 Acute bronchitis, unspecified: Secondary | ICD-10-CM | POA: Insufficient documentation

## 2012-11-04 DIAGNOSIS — Z8719 Personal history of other diseases of the digestive system: Secondary | ICD-10-CM | POA: Insufficient documentation

## 2012-11-04 DIAGNOSIS — E669 Obesity, unspecified: Secondary | ICD-10-CM | POA: Insufficient documentation

## 2012-11-04 DIAGNOSIS — R071 Chest pain on breathing: Secondary | ICD-10-CM | POA: Insufficient documentation

## 2012-11-04 DIAGNOSIS — Z79899 Other long term (current) drug therapy: Secondary | ICD-10-CM | POA: Insufficient documentation

## 2012-11-04 DIAGNOSIS — Z7982 Long term (current) use of aspirin: Secondary | ICD-10-CM | POA: Insufficient documentation

## 2012-11-04 DIAGNOSIS — I10 Essential (primary) hypertension: Secondary | ICD-10-CM | POA: Insufficient documentation

## 2012-11-04 DIAGNOSIS — E119 Type 2 diabetes mellitus without complications: Secondary | ICD-10-CM | POA: Insufficient documentation

## 2012-11-04 DIAGNOSIS — Z8739 Personal history of other diseases of the musculoskeletal system and connective tissue: Secondary | ICD-10-CM | POA: Insufficient documentation

## 2012-11-04 MED ORDER — AZITHROMYCIN 250 MG PO TABS: ORAL_TABLET | ORAL | Status: DC

## 2012-11-04 MED ORDER — GUAIFENESIN-CODEINE 100-10 MG/5ML PO SYRP: ORAL_SOLUTION | ORAL | Status: DC

## 2012-11-04 NOTE — ED Notes (Signed)
Coughing and wheezing and chest hurting x 2 weeks has taken otc meds but not helping

## 2012-11-04 NOTE — ED Provider Notes (Signed)
History  This chart was scribed for Kaylee Cooper III, MD by Shari Heritage, ED Scribe. The patient was seen in room TR08C/TR08C. Patient's care was started at 1502.  CSN: [id number]  Arrival date & time 11/04/12  1119   First MD Initiated Contact with Patient 11/04/12 1502      Chief Complaint  Patient presents with  . Cough    The history is provided by the patient. No language interpreter was used.    HPI Comments: Shavna Gerry is a 61 y.o. female who presents to the Emergency Department complaining of moderate, persistent, productive cough onset 2 weeks ago. Patient states that she is producing green sputum. There is associated chest pain with cough. Patient has tried Tylenol decongestant with no relief. Patient has not been exposed to anyone who has had similar symptoms or an URI. 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 Patient had a surgical history of hernia repair, nose surgery and ankle fracture surgery. Patient is allergic to sulfa antibiotics. Patient does not smoke or drink alcohol. Patient has no PCP, is a former [facility] patient.   Past Medical History  Diagnosis Date  . Diabetes mellitus   . Hypertension   . Obesity   . Abdominal hernia   . Arthritis     knees  . Edema     legs    Past Surgical History  Procedure Date  . Hernia repair 07/03/2011    abd hernia  . Ankle fracture surgery 1998?    right  . Nose surgery     Family History  Problem Relation Age of Onset  . Hypertension Mother   . Kidney disease Mother   . Diabetes Father   . Hypertension Father   . Diabetes Sister   . Hypertension Sister   . Diabetes Brother   . Hypertension Brother   . Heart disease Brother 56    heart attack  . Hypertension Sister     History  Substance Use Topics  . Smoking status: Never Smoker   . Smokeless tobacco: Never Used  . Alcohol Use: No    OB History    Grav Para Term Preterm Abortions TAB  SAB Ect Mult Living                  Review of Systems  Respiratory: Positive for cough.   Cardiovascular: Positive for chest pain.  All other systems reviewed and are negative.    Allergies  Sulfa antibiotics  Home Medications   Current Outpatient Rx  Name  Route  Sig  Dispense  Refill  . ASPIRIN 81 MG PO TABS   Oral   Take 81 mg by mouth daily.           Marland Kitchen BENAZEPRIL HCL 40 MG PO TABS   Oral   Take 40 mg by mouth daily.           . CHLORPHEN-PE-ACETAMINOPHEN 2-5-325 & 5-325 MG PO MISC   Oral   Take 1 tablet by mouth every 8 (eight) hours as needed. For congestion         . COD LIVER OIL PO   Oral   Take 1 capsule by mouth daily.         . FUROSEMIDE 40 MG PO TABS   Oral   Take 40-80 mg by mouth daily. Take 1 tablet alternating with 2 tablets daily         . MELOXICAM  7.5 MG PO TABS   Oral   Take 7.5 mg by mouth daily.         Marland Kitchen METFORMIN HCL 1000 MG PO TABS   Oral   Take 1,000 mg by mouth 2 (two) times daily with a meal.           . ONE-DAILY MULTI VITAMINS PO TABS   Oral   Take 1 tablet by mouth daily.           Marland Kitchen NAPROXEN SODIUM 220 MG PO TABS   Oral   Take 220 mg by mouth 2 (two) times daily as needed. For pain         . TRAMADOL HCL 50 MG PO TABS   Oral   Take 50 mg by mouth every 8 (eight) hours as needed. For pain  -  Maximum dose= 8 tablets per day           Triage Vitals: BP 167/101  Pulse 80  Temp 98.2 F (36.8 C) (Oral)  Resp 16  SpO2 97%  Physical Exam  Nursing note and vitals reviewed. Constitutional: She is oriented to person, place, and time. She appears well-developed and well-nourished. No distress.       Morbidly obese.  HENT:  Head: Normocephalic and atraumatic.  Right Ear: External ear normal.  Left Ear: External ear normal.  Nose: Nose normal.  Mouth/Throat: Oropharynx is clear and moist and mucous membranes are normal.  Eyes: EOM are normal. Pupils are equal, round, and reactive to light.  Neck:  Normal range of motion. Neck supple. No tracheal deviation present.  Cardiovascular: Normal rate and regular rhythm.   No murmur heard. Pulmonary/Chest: Effort normal. No respiratory distress. She has no wheezes. She has no rales.  Abdominal: Soft. Bowel sounds are normal. She exhibits no distension. There is no tenderness. There is no rebound and no guarding.  Musculoskeletal: Normal range of motion. She exhibits no edema.       Neurologically intact distally.  Lymphadenopathy:    She has no cervical adenopathy.  Neurological: She is alert and oriented to person, place, and time. No sensory deficit.  Skin: Skin is warm and dry.  Psychiatric: She has a normal mood and affect. Her behavior is normal.    ED Course  Procedures (including critical care time) DIAGNOSTIC STUDIES: Oxygen Saturation is 97% on room air, adequate by my interpretation.    COORDINATION OF CARE: 3:12 PM- Patient informed of current plan for treatment and evaluation and agrees with plan at this time. Will treat patient for bronchitis with 5-day course of zithromax and Robitussin AC.  Dg Chest 2 View  11/04/2012  *RADIOLOGY REPORT*  Clinical Data: Cough  CHEST - 2 VIEW  Comparison:  08/02/2011  Findings:  The heart size and mediastinal contours are within normal limits.  Both lungs are clear.  The visualized skeletal structures are unremarkable.  Degenerative changes of the spine and right AC joint.  IMPRESSION: No active cardiopulmonary disease.   Original Report Authenticated By: Judie Petit. Shick, M.D.      1. Acute bronchitis     Rx azithromycin and Robitussin AC.  I personally performed the services described in this documentation, which was scribed in my presence. The recorded information has been reviewed and is accurate. Osvaldo Human, MD     Kaylee Cooper III, MD 11/04/12 [phone]

## 2013-01-15 ENCOUNTER — Institutional Professional Consult (permissible substitution): Payer: No Typology Code available for payment source | Admitting: Internal Medicine

## 2013-01-29 ENCOUNTER — Institutional Professional Consult (permissible substitution): Payer: No Typology Code available for payment source | Admitting: Emergency Medicine

## 2013-02-12 ENCOUNTER — Encounter: Payer: Self-pay | Admitting: Internal Medicine

## 2013-02-12 ENCOUNTER — Ambulatory Visit (INDEPENDENT_AMBULATORY_CARE_PROVIDER_SITE_OTHER): Payer: Medicare Other | Admitting: Internal Medicine

## 2013-02-12 VITALS — BP 140/82 | HR 114 | Temp 97.5°F | Ht 66.0 in | Wt 308.0 lb

## 2013-02-12 DIAGNOSIS — I10 Essential (primary) hypertension: Secondary | ICD-10-CM

## 2013-02-12 DIAGNOSIS — R05 Cough: Secondary | ICD-10-CM

## 2013-02-12 DIAGNOSIS — R059 Cough, unspecified: Secondary | ICD-10-CM | POA: Insufficient documentation

## 2013-02-12 MED ORDER — TRAMADOL HCL 50 MG PO TABS: 50.0000 mg | ORAL_TABLET | Freq: Three times a day (TID) | ORAL | Status: DC | PRN

## 2013-02-12 MED ORDER — OLMESARTAN MEDOXOMIL 40 MG PO TABS: 40.0000 mg | ORAL_TABLET | Freq: Every day | ORAL | Status: DC

## 2013-02-12 NOTE — Assessment & Plan Note (Addendum)
ACE inhibitors are problematic in  pts with airway complaints because  even experienced pulmonologists can't always distinguish ace effects from copd/asthma.  By themselves they don't actually cause a problem, much like oxygen can't by itself start a fire, but they certainly serve as a powerful catalyst or enhancer for any "fire"  or inflammatory process in the upper airway, be it caused by an ET  tube or more commonly reflux (especially in the obese or pts with known GERD or who are on biphoshonates).    In the era of ARB near equivalency until we have a better handle on the reversibility of the airway problem, it just makes sense to avoid ACEI  entirely in the short run and then decide later, having established a level of airway control using a reasonable limited regimen, whether to add back ace but even then being very careful to observe the pt for worsening airway control and number of meds used/ needed to control symptoms.    For now try benicar samples and regroup in 4 weeks if not better > if better will defer longterm rx to Dr Hyacinth Meeker

## 2013-02-12 NOTE — Patient Instructions (Addendum)
Stop lotensin and start benicar 40 mg one daily and if better in 4 weeks see Dr Hyacinth Meeker, if not see me  Try prilosec 20mg   Take 30-60 min before first meal of the day and Pepcid 20 mg one bedtime until cough is completely gone for at least a week without the need for cough suppression  I think of reflux for chronic cough like I do oxygen for fire (doesn't cause the fire but once you get the oxygen suppressed it usually goes away regardless of the exact cause).   Take delsym two tsp every 12 hours and supplement if needed with  tramadol 50 mg up to 2 every 4 hours to suppress the urge to cough. Swallowing water or using ice chips/non mint and menthol containing candies (such as lifesavers or sugarless jolly ranchers) are also effective.  You should rest your voice and avoid activities that you know make you cough.  Once you have eliminated the cough for 3 straight days try reducing the tramadol first,  then the delsym as tolerated.    GERD (REFLUX)  is an extremely common cause of respiratory symptoms, many times with no significant heartburn at all.    It can be treated with medication, but also with lifestyle changes including avoidance of late meals, excessive alcohol, smoking cessation, and avoid fatty foods, chocolate, peppermint, colas, red wine, and acidic juices such as orange juice.  NO MINT OR MENTHOL PRODUCTS SO NO COUGH DROPS  USE SUGARLESS CANDY INSTEAD (jolley ranchers or Stover's)  NO OIL BASED VITAMINS - use powdered substitutes.

## 2013-02-12 NOTE — Progress Notes (Signed)
  Subjective:    Patient ID: [name], female    DOB: 1951-06-02   MRN: [id number]  HPI  40 yobf never smoker with onset sinus problms in 2010 requiring sinus surgery for nose bleeds by Ezzard Standing which corrected the problem  then onset cough Oct 2013 self -referred to pulmonary clinic 02/12/2013    02/12/2013 1st pulmonary eval on Lotensin cc chronic cough acute onset late Oct 2013 assoc sinus pain that got better p sinaid then developed  productions of purulent sputum to ER Oct 28 2012 dx with acute bronchitis no better with rx with prednisone rx and since then rx with albuterol and levaquin then doxy > makes her dizzy. Cough is worse at hs but minimally productive of mucoid sputum with sense of too much throat and chest mucus and subjective wheexe no better with saba.  No obvious daytime variabilty or assoc  cp or chest tightness, subjective wheeze overt sinus or hb symptoms. No unusual exp hx or h/o childhood pna/ asthma or premature birth to her knowledge.     Also denies any obvious fluctuation of symptoms with weather or environmental changes or other aggravating or alleviating factors except as outlined above       Review of Systems  Constitutional: Negative for fever, chills and unexpected weight change.  HENT: Negative for ear pain, nosebleeds, congestion, sore throat, rhinorrhea, sneezing, trouble swallowing, dental problem, voice change, postnasal drip and sinus pressure.   Eyes: Negative for visual disturbance.  Respiratory: Positive for shortness of breath. Negative for cough and choking.   Cardiovascular: Positive for chest pain and leg swelling.  Gastrointestinal: Negative for vomiting, abdominal pain and diarrhea.  Genitourinary: Negative for difficulty urinating.  Musculoskeletal: Positive for arthralgias.  Skin: Negative for rash.  Neurological: Negative for tremors, syncope and headaches.  Hematological: Does not bruise/bleed easily.       Objective:   Physical  Exam  Wt Readings from Last 3 Encounters:  02/12/13 308 lb (139.708 kg)  04/03/12 286 lb 6 oz (129.899 kg)  11/27/11 288 lb (130.636 kg)   HEENT: nl dentition, turbinates, and orophanx. Nl external ear canals without cough reflex   NECK :  without JVD/Nodes/TM/ nl carotid upstrokes bilaterally   LUNGS: no acc muscle use, clear to A and P bilaterally without cough on insp or exp maneuvers   CV:  RRR  no s3 or murmur or increase in P2, no edema   ABD:  soft and nontender with nl excursion in the supine position. No bruits or organomegaly, bowel sounds nl  MS:  warm without deformities, calf tenderness, cyanosis or clubbing  SKIN: warm and dry without lesions    NEURO:  alert, approp, no deficits    cxr 11/04/12 No active cardiopulmonary disease  cxr done w/in one week of ov not avail but reported to pt nl   .      Assessment & Plan:

## 2013-02-12 NOTE — Assessment & Plan Note (Signed)
The most common causes of chronic cough in immunocompetent adults include the following: upper airway cough syndrome (UACS), previously referred to as postnasal drip syndrome (PNDS), which is caused by variety of rhinosinus conditions; (2) asthma; (3) GERD; (4) chronic bronchitis from cigarette smoking or other inhaled environmental irritants; (5) nonasthmatic eosinophilic bronchitis; and (6) bronchiectasis.   These conditions, singly or in combination, have accounted for up to 94% of the causes of chronic cough in prospective studies.   Other conditions have constituted no >6% of the causes in prospective studies These have included bronchogenic carcinoma, chronic interstitial pneumonia, sarcoidosis, left ventricular failure, ACEI-induced cough, and aspiration from a condition associated with pharyngeal dysfunction.    Chronic cough is often simultaneously caused by more than one condition. A single cause has been found from 38 to 82% of the time, multiple causes from 18 to 62%. Multiply caused cough has been the result of three diseases up to 42% of the time.       Most likely this is  Classic Upper airway cough syndrome, so named because it's frequently impossible to sort out how much is  CR/sinusitis with freq throat clearing (which can be related to primary GERD)   vs  causing  secondary (" extra esophageal")  GERD from wide swings in gastric pressure that occur with throat clearing, often  promoting self use of mint and menthol lozenges that reduce the lower esophageal sphincter tone and exacerbate the problem further in a cyclical fashion.   These are the same pts (now being labeled as having "irritable larynx syndrome" by some cough centers) who not infrequently have a history of having failed to tolerate ace inhibitors,  dry powder inhalers or biphosphonates or report having atypical reflux symptoms that don't respond to standard doses of PPI , and are easily confused as having aecopd or asthma  flares by even experienced allergists/ pulmonologists.   First try off acei and on standard gerd rx then regroup in 4 weeks if not better

## 2013-03-24 ENCOUNTER — Telehealth: Payer: Self-pay | Admitting: Internal Medicine

## 2013-03-24 MED ORDER — OLMESARTAN MEDOXOMIL 40 MG PO TABS: 40.0000 mg | ORAL_TABLET | Freq: Every day | ORAL | Status: DC

## 2013-03-24 MED ORDER — TRAMADOL HCL 50 MG PO TABS: 50.0000 mg | ORAL_TABLET | Freq: Three times a day (TID) | ORAL | Status: DC | PRN

## 2013-03-24 NOTE — Telephone Encounter (Signed)
Spoke with patient, patient has OV scheduled for 04/02/13. Rx has been sent in and nothing further needed at this time.

## 2013-03-24 NOTE — Telephone Encounter (Signed)
lmomtcb x1 for pt 

## 2013-03-24 NOTE — Telephone Encounter (Signed)
Patient returning call.

## 2013-03-24 NOTE — Telephone Encounter (Signed)
Spoke with patient, patient  Requesting refill on Benicar and Tramadol. Benicar 40mg  1 daily- Last filled 02/12/13 Tramdol 50mg  1q8hrs prn pain - last filled 02/12/13 # 60 with 0 refill  Pharmacy: [facility] [facility]  Last seen 02/12/13: Patient Instructions    Stop lotensin and start benicar 40 mg one daily and if better in 4 weeks see Dr Hyacinth Meeker, if not see me  Try prilosec 20mg  Take 30-60 min before first meal of the day and Pepcid 20 mg one bedtime until cough is completely gone for at least a week without the need for cough suppression  I think of reflux for chronic cough like I do oxygen for fire (doesn't cause the fire but once you get the oxygen suppressed it usually goes away regardless of the exact cause).  Take delsym two tsp every 12 hours and supplement if needed with tramadol 50 mg up to 2 every 4 hours to suppress the urge to cough. Swallowing water or using ice chips/non mint and menthol containing candies (such as lifesavers or sugarless jolly ranchers) are also effective. You should rest your voice and avoid activities that you know make you cough.  Once you have eliminated the cough for 3 straight days try reducing the tramadol first, then the delsym as tolerated.  GERD (REFLUX) is an extremely common cause of respiratory symptoms, many times with no significant heartburn at all.  It can be treated with medication, but also with lifestyle changes including avoidance of late meals, excessive alcohol, smoking cessation, and avoid fatty foods, chocolate, peppermint, colas, red wine, and acidic juices such as orange juice.  NO MINT OR MENTHOL PRODUCTS SO NO COUGH DROPS  USE SUGARLESS CANDY INSTEAD (jolley ranchers or Stover's)  NO OIL BASED VITAMINS - use powdered substitutes.

## 2013-03-24 NOTE — Telephone Encounter (Signed)
Ok but make sure has ov with me or Tammy w/in 2 weeks because should have been able to eliminate the tramadol by now

## 2013-04-02 ENCOUNTER — Encounter: Payer: Self-pay | Admitting: Internal Medicine

## 2013-04-02 ENCOUNTER — Ambulatory Visit (INDEPENDENT_AMBULATORY_CARE_PROVIDER_SITE_OTHER)
Admission: RE | Admit: 2013-04-02 | Discharge: 2013-04-02 | Disposition: A | Discharge status: Home / Self Care | Payer: Medicare Other | Source: Ambulatory Visit | Attending: Internal Medicine | Admitting: Internal Medicine

## 2013-04-02 ENCOUNTER — Ambulatory Visit (INDEPENDENT_AMBULATORY_CARE_PROVIDER_SITE_OTHER): Payer: Medicare Other | Admitting: Internal Medicine

## 2013-04-02 VITALS — BP 136/80 | HR 108 | Temp 97.1°F | Ht 66.0 in | Wt 306.8 lb

## 2013-04-02 DIAGNOSIS — R0989 Other specified symptoms and signs involving the circulatory and respiratory systems: Secondary | ICD-10-CM

## 2013-04-02 DIAGNOSIS — R059 Cough, unspecified: Secondary | ICD-10-CM

## 2013-04-02 DIAGNOSIS — I10 Essential (primary) hypertension: Secondary | ICD-10-CM

## 2013-04-02 DIAGNOSIS — R05 Cough: Secondary | ICD-10-CM

## 2013-04-02 DIAGNOSIS — R0600 Dyspnea, unspecified: Secondary | ICD-10-CM | POA: Insufficient documentation

## 2013-04-02 DIAGNOSIS — R0609 Other forms of dyspnea: Secondary | ICD-10-CM | POA: Insufficient documentation

## 2013-04-02 MED ORDER — TRAMADOL HCL 50 MG PO TABS: ORAL_TABLET | ORAL | Status: DC

## 2013-04-02 MED ORDER — METHYLPREDNISOLONE ACETATE 80 MG/ML IJ SUSP
120.0000 mg | Freq: Once | INTRAMUSCULAR | Status: AC
  Administered 2013-04-02: 120 mg via INTRAMUSCULAR

## 2013-04-02 NOTE — Patient Instructions (Addendum)
Please see patient coordinator before you leave today  to schedule sinus ct  Add chlortrimeton 4 mg one at bedtime (this is over the counter)   Try prilosec 20mg   Take 30-60 min before first meal of the day and Pepcid 20 mg one bedtime until  Return  Take delsym two tsp every 12 hours and supplement if needed with  tramadol 50 mg up to 2 every 4 hours to suppress the urge to cough. Swallowing water or using ice chips/non mint and menthol containing candies (such as lifesavers or sugarless jolly ranchers) are also effective.  You should rest your voice and avoid activities that you know make you cough.  Once you have eliminated the cough for 3 straight days try reducing the tramadol first,  then the delsym as tolerated.    GERD (REFLUX)  is an extremely common cause of respiratory symptoms, many times with no significant heartburn at all.    It can be treated with medication, but also with lifestyle changes including avoidance of late meals, excessive alcohol, smoking cessation, and avoid fatty foods, chocolate, peppermint, colas, red wine, and acidic juices such as orange juice.  NO MINT OR MENTHOL PRODUCTS SO NO COUGH DROPS  USE SUGARLESS CANDY INSTEAD (jolley ranchers or Stover's)  NO OIL BASED VITAMINS - use powdered substitutes.  Please remember to go to the  x-ray department downstairs for your tests - we will call you with the results when they are available.  See Tammy NP w/in 2 weeks with all your medications, even over the counter meds, separated in two separate bags, the ones you take no matter what vs the ones you stop once you feel better and take only as needed when you feel you need them.   Tammy  will generate for you a new user friendly medication calendar that will put [place] all on the same page re: your medication use.     Without this process, it simply isn't possible to assure that we are providing  your outpatient care  with  the attention to detail we feel you deserve.   If  we cannot assure that you're getting that kind of care,  then we cannot manage your problem effectively from this clinic.  Once you have seen Tammy and we are sure that we're all on the same page with your medication use she will arrange follow up with me.

## 2013-04-02 NOTE — Assessment & Plan Note (Addendum)
-   Try off acei effective 02/13/13 - Sinus Ct ordered 04/02/2013 >>>  No better off acei  But non-adherent with recs to date other than she did apparently stop acei but still has an obvious upper airway cough   The standardized cough guidelines published in [facility] by [place] in 2006 are still the best available and consist of a multiple step process (up to 12!) , not a single office visit,  and are intended  to address this problem logically,  with an alogrithm dependent on response to empiric treatment at  each progressive step  to determine a specific diagnosis with  minimal addtional testing needed. Therefore if adherence is an issue or can't be accurately verified,  it's very unlikely the standard evaluation and treatment will be successful here.    Furthermore, response to therapy (other than acute cough suppression, which should only be used short term with avoidance of narcotic containing cough syrups if possible), can be a gradual process for which the patient may perceive immediate benefit.  Unlike going to an eye doctor where the best perscription is almost always the first one and is immediately effective, this is almost never the case in the management of chronic cough syndromes. Therefore the patient needs to commit up front to consistently adhere to recommendations  for up to 6 weeks of therapy directed at the likely underlying problem(s) before the response can be reasonably evaluated.   She is struggling with concept of med reconciliation and until we have this sorted out it's not liekly we'll be able to help her   To keep things simple, I have asked the patient to first separate medicines that are perceived as maintenance, that is to be taken daily "no matter what", from those medicines that are taken on only on an as-needed basis and I have given the patient examples of both, and then return to see our NP to generate a  detailed  medication calendar which should be followed until  the next physician sees the patient and updates it.

## 2013-04-02 NOTE — Assessment & Plan Note (Signed)
Changed acei to arb 02/12/2013 due to cough

## 2013-04-02 NOTE — Progress Notes (Signed)
Subjective:    Patient ID: [name], female    DOB: 1951-02-02   MRN: [id number]  HPI  20 yobf never smoker with onset sinus problms in 2010 requiring sinus surgery for nose bleeds by Ezzard Standing which corrected the problem  then onset cough Oct 2013 self -referred to pulmonary clinic 02/12/2013    02/12/2013 1st pulmonary eval on Lotensin cc chronic cough acute onset late Oct 2013 assoc sinus pain that got better p sinaid then developed  productions of purulent sputum to ER Oct 28 2012 dx with acute bronchitis no better with rx with prednisone rx and since then rx with albuterol and levaquin then doxy > makes her dizzy. Cough is worse at hs but minimally productive of mucoid sputum with sense of too much throat and chest mucus and subjective wheeze no better with saba. rec Stop lotensin and start benicar 40 mg one daily and if better in 4 weeks see Dr Hyacinth Meeker, if not see me Try prilosec 20mg   Take 30-60 min before first meal of the day and Pepcid 20 mg one bedtime until cough is completely gone for at least a week without the need for cough suppression Take delsym two tsp every 12 hours and supplement if needed with  tramadol 50 mg up to 2 every 4 hours to suppress the urge to cough. .   GERD diet   04/02/2013 f/u ov/Wert f/u cough Chief Complaint  Patient presents with  . Follow-up    SOB and Cough are the same, no better or worse since the last visit.    Did not follow instructions. Cough worse at hs with min mucoid sputum "ever since that ng tube"  No obvious daytime variabilty or assoc  cp or chest tightness, subjective wheeze overt sinus or hb symptoms. No unusual exp hx or h/o childhood pna/ asthma or premature birth to her knowledge.   Once asleep does  ok  early am exacerbation  of respiratory  c/o's or need for noct saba. Also denies any obvious fluctuation of symptoms with weather or environmental changes or other aggravating or alleviating factors except as outlined above   ROS   The following are not active complaints unless bolded sore throat, dysphagia, dental problems, itching, sneezing,  nasal congestion or excess/ purulent secretions, ear ache,   fever, chills, sweats, unintended wt loss, pleuritic or exertional cp, hemoptysis,  orthopnea pnd or leg swelling, presyncope, palpitations, heartburn, abdominal pain, anorexia, nausea, vomiting, diarrhea  or change in bowel or urinary habits, change in stools or urine, dysuria,hematuria,  rash, arthralgias, visual complaints, headache, numbness weakness or ataxia or problems with walking or coordination,  change in mood/affect or memory.               Objective:   Physical Exam  Obese amb wf extreme difficulty getting up on exam table even with assistance due to wt plus orthopedic restrictions  04/02/2013    307  Wt Readings from Last 3 Encounters:  02/12/13 308 lb (139.708 kg)  04/03/12 286 lb 6 oz (129.899 kg)  11/27/11 288 lb (130.636 kg)   HEENT: nl dentition, turbinates, and orophanx. Nl external ear canals without cough reflex   NECK :  without JVD/Nodes/TM/ nl carotid upstrokes bilaterally   LUNGS: no acc muscle use, clear to A and P bilaterally without cough on insp or exp maneuvers   CV:  RRR  no s3 or murmur or increase in P2, no edema   ABD:  soft and nontender with  nl excursion in the supine position. No bruits or organomegaly, bowel sounds nl  MS:  warm without deformities, calf tenderness, cyanosis or clubbing  SKIN: warm and dry without lesions    NEURO:  alert, approp, no deficits    CXR  04/02/2013 : Report:  Subtle atelectasis versus infiltrate at retrocardiac left lower  lobe.  My review :  wnl  .      Assessment & Plan:

## 2013-04-04 ENCOUNTER — Encounter: Payer: Self-pay | Admitting: Internal Medicine

## 2013-04-04 ENCOUNTER — Ambulatory Visit (INDEPENDENT_AMBULATORY_CARE_PROVIDER_SITE_OTHER)
Admission: RE | Admit: 2013-04-04 | Discharge: 2013-04-04 | Disposition: A | Discharge status: Home / Self Care | Payer: Medicare Other | Source: Ambulatory Visit | Attending: Internal Medicine | Admitting: Internal Medicine

## 2013-04-04 DIAGNOSIS — R05 Cough: Secondary | ICD-10-CM

## 2013-04-04 DIAGNOSIS — R059 Cough, unspecified: Secondary | ICD-10-CM

## 2013-04-07 ENCOUNTER — Telehealth: Payer: Self-pay | Admitting: Internal Medicine

## 2013-04-07 DIAGNOSIS — R05 Cough: Secondary | ICD-10-CM

## 2013-04-07 DIAGNOSIS — R059 Cough, unspecified: Secondary | ICD-10-CM

## 2013-04-07 NOTE — Telephone Encounter (Signed)
Ok for tramadol refill cxr should have been called back, not clear why it wasn't, but shows subtle changes on Left which may be artifact but do require f/u in 2weeks in office with all meds in hand

## 2013-04-07 NOTE — Telephone Encounter (Signed)
Result Note    Call patient : Study is unremarkable, no change in recs   --- lmomtcb x1 for pt

## 2013-04-07 NOTE — Telephone Encounter (Signed)
Spoke with pt and notified of results of ct sinus per Dr. Sherene Sires. Pt verbalized understanding. She is asking for cxr results and refill on tramadol  CXR still not signed off on yet Tramadol last filled 4/16 # 40 tablets no refills Please advise, thanks!

## 2013-04-07 NOTE — Telephone Encounter (Addendum)
Pt returned call. She asks that nurse call her back today since she has been waiting for this call "all day". Also asks that nurse let phone ring "more than 2 times".  [name]

## 2013-04-07 NOTE — Progress Notes (Signed)
Quick Note:  LMTCB ______ 

## 2013-04-08 MED ORDER — TRAMADOL HCL 50 MG PO TABS: ORAL_TABLET | ORAL | Status: DC

## 2013-04-08 NOTE — Telephone Encounter (Signed)
Spoke with patient, Informed her of cxr results, pt verbalized understanding Verified already scheduled appt w TP for 04/17/13. Pt aware Rx has been called in to pharmacy and nothing further needed at this time.

## 2013-04-10 NOTE — Progress Notes (Signed)
Quick Note:  Called, spoke with pt. Informed her of CT results and recs per Dr. Sherene Sires.  She verbalized understanding of both. She is aware of pending OV with TP on Apr 17, 2013. She voiced no further questions or concerns at this time. ______

## 2013-04-17 ENCOUNTER — Encounter: Payer: Self-pay | Admitting: Adult Health

## 2013-04-17 ENCOUNTER — Ambulatory Visit (INDEPENDENT_AMBULATORY_CARE_PROVIDER_SITE_OTHER): Payer: Medicare Other | Admitting: Adult Health

## 2013-04-17 VITALS — BP 132/78 | HR 104 | Temp 97.4°F | Ht 66.0 in | Wt 300.6 lb

## 2013-04-17 DIAGNOSIS — R05 Cough: Secondary | ICD-10-CM

## 2013-04-17 DIAGNOSIS — R059 Cough, unspecified: Secondary | ICD-10-CM

## 2013-04-17 MED ORDER — LOSARTAN POTASSIUM-HCTZ 100-12.5 MG PO TABS: 1.0000 | ORAL_TABLET | Freq: Every day | ORAL | Status: DC

## 2013-04-17 NOTE — Patient Instructions (Addendum)
Continue on Benicar 40/12.5mg  until samples are gone then Hyzaar 100/12.5mg   Continue on Prilosec 20mg   Take 30-60 min before first meal of the day and Pepcid 20 mg one bedtime  Delysm two tsp every 12 hours and supplement if needed  May use Tramadol 50 mg up to 2 every 4 hours As needed  For breakthrough coughing.  Continue on Chlor Tabs 4mg  At bedtime   Swallowing water or using ice chips/non mint and menthol containing candies (such as lifesavers or sugarless jolly ranchers) are also effective.  You should rest your voice and avoid activities that you know make you cough. Follow up Dr. Sherene Sires  In 4-6 weeks and As needed

## 2013-04-18 MED ORDER — OLMESARTAN MEDOXOMIL-HCTZ 40-12.5 MG PO TABS: 1.0000 | ORAL_TABLET | Freq: Every day | ORAL | Status: DC

## 2013-04-22 NOTE — Progress Notes (Signed)
  Subjective:    Patient ID: [name], female    DOB: 23-Jul-1951   MRN: [id number]  HPI  34 yobf never smoker with onset sinus problms in 2010 requiring sinus surgery for nose bleeds by Ezzard Standing which corrected the problem  then onset cough Oct 2013 self -referred to pulmonary clinic 02/12/2013    02/12/2013 1st pulmonary eval on Lotensin cc chronic cough acute onset late Oct 2013 assoc sinus pain that got better p sinaid then developed  productions of purulent sputum to ER Oct 28 2012 dx with acute bronchitis no better with rx with prednisone rx and since then rx with albuterol and levaquin then doxy > makes her dizzy. Cough is worse at hs but minimally productive of mucoid sputum with sense of too much throat and chest mucus and subjective wheeze no better with saba. rec Stop lotensin and start benicar 40 mg one daily and if better in 4 weeks see Dr Hyacinth Meeker, if not see me Try prilosec 20mg   Take 30-60 min before first meal of the day and Pepcid 20 mg one bedtime until cough is completely gone for at least a week without the need for cough suppression Take delsym two tsp every 12 hours and supplement if needed with  tramadol 50 mg up to 2 every 4 hours to suppress the urge to cough. .   GERD diet   04/02/2013 f/u ov/Wert f/u cough Chief Complaint  Patient presents with  . Follow-up    SOB and Cough are the same, no better or worse since the last visit.    Did not follow instructions. Cough worse at hs with min mucoid sputum "ever since that ng tube"   >cough control regimen  04/17/13 Follow up  Returns for follow up  Feels cough is much better; still having some congestion in the AM with cream-colored mucus -   CT sinus 4/24 showed min. Chronic sinusitis  CXR w/ no acute process, LLL atx .  She denies chest pain, orthopnea, edema or feve.r    ROS Reviewed and neg except HPI        Objective:   Physical Exam      HEENT: nl dentition, turbinates, and orophanx. Nl external ear  canals without cough reflex   NECK :  without JVD/Nodes/TM/ nl carotid upstrokes bilaterally   LUNGS: no acc muscle use, clear to A and P bilaterally without cough on insp or exp maneuvers   CV:  RRR  no s3 or murmur or increase in P2, no edema   ABD:  soft and nontender with nl excursion in the supine position. No bruits or organomegaly, bowel sounds nl  MS:  warm without deformities, calf tenderness, cyanosis or clubbing  SKIN: warm and dry without lesions    NEURO:  alert, approp, no deficits    CXR  04/02/2013 : Report:  Subtle atelectasis versus infiltrate at retrocardiac left lower  lobe.  My review :  wnl  .      Assessment & Plan:

## 2013-04-22 NOTE — Assessment & Plan Note (Signed)
Improving   Plan  Continue on Benicar 40/12.5mg  until samples are gone then Hyzaar 100/12.5mg   Continue on Prilosec 20mg   Take 30-60 min before first meal of the day and Pepcid 20 mg one bedtime  Delysm two tsp every 12 hours and supplement if needed  May use Tramadol 50 mg up to 2 every 4 hours As needed  For breakthrough coughing.  Continue on Chlor Tabs 4mg  At bedtime   Swallowing water or using ice chips/non mint and menthol containing candies (such as lifesavers or sugarless jolly ranchers) are also effective.  You should rest your voice and avoid activities that you know make you cough. Follow up Dr. Sherene Sires  In 4-6 weeks and As needed

## 2013-04-29 ENCOUNTER — Telehealth: Payer: Self-pay | Admitting: Internal Medicine

## 2013-04-29 DIAGNOSIS — R05 Cough: Secondary | ICD-10-CM

## 2013-04-29 DIAGNOSIS — R059 Cough, unspecified: Secondary | ICD-10-CM

## 2013-04-29 MED ORDER — TRAMADOL HCL 50 MG PO TABS: ORAL_TABLET | ORAL | Status: DC

## 2013-04-29 NOTE — Telephone Encounter (Signed)
Spoke with patient she has been rescheduled to see TP for med calender next week 05/08/13 @1145 . Tramadol Rx has been sent to verified pharmacy Nothing further needed at this time

## 2013-04-29 NOTE — Telephone Encounter (Signed)
Spoke with patient, Patient requesting refill on her tramadol be called in to [facility] Patient states shes still having prod cough some days much worse than other Dr. Sherene Sires, can this Rx be sent in? Please advise, thank you

## 2013-04-29 NOTE — Telephone Encounter (Signed)
ATC patient to verify message, no answer [facility]

## 2013-04-29 NOTE — Telephone Encounter (Signed)
Ok x one refill but move up f/u with all meds in hand and Tammy's calendar too so we can regroup

## 2013-05-08 ENCOUNTER — Ambulatory Visit (INDEPENDENT_AMBULATORY_CARE_PROVIDER_SITE_OTHER): Payer: Medicare Other | Admitting: Adult Health

## 2013-05-08 ENCOUNTER — Encounter: Payer: Self-pay | Admitting: Adult Health

## 2013-05-08 VITALS — BP 130/84 | HR 94 | Temp 98.2°F | Ht 66.75 in | Wt 303.8 lb

## 2013-05-08 DIAGNOSIS — R059 Cough, unspecified: Secondary | ICD-10-CM

## 2013-05-08 DIAGNOSIS — R05 Cough: Secondary | ICD-10-CM

## 2013-05-08 NOTE — Progress Notes (Signed)
  Subjective:    Patient ID: [name], female    DOB: [birth year]   MRN: [id number]  HPI  65 yobf never smoker with onset sinus problms in 2010 requiring sinus surgery for nose bleeds by Ezzard Standing which corrected the problem  then onset cough Oct 2013 self -referred to pulmonary clinic 02/12/2013    02/12/2013 1st pulmonary eval on Lotensin cc chronic cough acute onset late Oct 2013 assoc sinus pain that got better p sinaid then developed  productions of purulent sputum to ER Oct 28 2012 dx with acute bronchitis no better with rx with prednisone rx and since then rx with albuterol and levaquin then doxy > makes her dizzy. Cough is worse at hs but minimally productive of mucoid sputum with sense of too much throat and chest mucus and subjective wheeze no better with saba. rec Stop lotensin and start benicar 40 mg one daily and if better in 4 weeks see Dr Hyacinth Meeker, if not see me Try prilosec 20mg   Take 30-60 min before first meal of the day and Pepcid 20 mg one bedtime until cough is completely gone for at least a week without the need for cough suppression Take delsym two tsp every 12 hours and supplement if needed with  tramadol 50 mg up to 2 every 4 hours to suppress the urge to cough. .   GERD diet   04/02/2013 f/u ov/Wert f/u cough Chief Complaint  Patient presents with  . Follow-up    SOB and Cough are the same, no better or worse since the last visit.    Did not follow instructions. Cough worse at hs with min mucoid sputum "ever since that ng tube"   >cough control regimen  04/17/13 Follow up  Returns for follow up  Feels cough is much better; still having some congestion in the AM with cream-colored mucus -   CT sinus 4/24 showed min. Chronic sinusitis  CXR w/ no acute process, LLL atx .  She denies chest pain, orthopnea, edema or feve.r  >>cont on same rx  05/08/2013 Med review  Returns for follow up and med review  We reviewed all her meds and organized them into a med calendar w/ pt  education  Appears she is taking correctly.  Cough is improved but still has some drainage and throat clearing.  No fever, discolored mucus, edema or orthopnea.     ROS Reviewed and neg except HPI        Objective:   Physical Exam      HEENT: nl dentition, turbinates, and orophanx. Nl external ear canals without cough reflex   NECK :  without JVD/Nodes/TM/ nl carotid upstrokes bilaterally   LUNGS: no acc muscle use, clear to A and P bilaterally without cough on insp or exp maneuvers   CV:  RRR  no s3 or murmur or increase in P2, no edema   ABD:  soft and nontender with nl excursion in the supine position. No bruits or organomegaly, bowel sounds nl  MS:  warm without deformities, calf tenderness, cyanosis or clubbing  SKIN: warm and dry without lesions    NEURO:  alert, approp, no deficits    CXR  04/02/2013 : Report:  Subtle atelectasis versus infiltrate at retrocardiac left lower  lobe.  My review :  wnl  .      Assessment & Plan:

## 2013-05-08 NOTE — Assessment & Plan Note (Signed)
Cough /Rhinitis  Patient's medications were reviewed today and patient education was given. Computerized medication calendar was adjusted/completed    Plan  Follow med calendar closely and bring to each visit.  Add extra chlor tabs As needed  For drainage and tickle in throat.  Follow up Dr. Sherene Sires  In 6 weeks and As needed

## 2013-05-08 NOTE — Addendum Note (Signed)
Addended by: Boone Master E on: 05/08/2013 04:08 PM   Modules accepted: Orders

## 2013-05-08 NOTE — Patient Instructions (Addendum)
Follow med calendar closely and bring to each visit.  Add extra chlor tabs As needed  For drainage and tickle in throat.  Follow up Dr. Sherene Sires  In 6 weeks and As needed

## 2013-05-16 ENCOUNTER — Telehealth: Payer: Self-pay | Admitting: Internal Medicine

## 2013-05-16 MED ORDER — OMEPRAZOLE 20 MG PO CPDR: 20.0000 mg | DELAYED_RELEASE_CAPSULE | Freq: Every day | ORAL | Status: DC

## 2013-05-16 NOTE — Telephone Encounter (Signed)
Will send in rx for omeprazole to her pharm  Pt aware and states nothing further needed

## 2013-05-22 ENCOUNTER — Ambulatory Visit: Payer: Medicare Other | Admitting: Internal Medicine

## 2013-06-18 ENCOUNTER — Telehealth: Payer: Self-pay | Admitting: Internal Medicine

## 2013-06-18 MED ORDER — TRAMADOL HCL 50 MG PO TABS: 50.0000 mg | ORAL_TABLET | Freq: Three times a day (TID) | ORAL | Status: DC | PRN

## 2013-06-18 NOTE — Telephone Encounter (Signed)
Ok x one but will need ov before any further refills

## 2013-06-18 NOTE — Telephone Encounter (Signed)
Rx has been sent in per MW. Pt is aware. Nothing further was needed. 

## 2013-06-18 NOTE — Telephone Encounter (Signed)
Last OV 04/02/2013 Last fill 04/29/2013 #40  Please advise if you are okay with refilling this. Thanks.

## 2013-06-19 ENCOUNTER — Ambulatory Visit: Payer: Medicare Other | Admitting: Internal Medicine

## 2013-06-25 ENCOUNTER — Encounter: Payer: Self-pay | Admitting: Internal Medicine

## 2013-06-25 ENCOUNTER — Ambulatory Visit (INDEPENDENT_AMBULATORY_CARE_PROVIDER_SITE_OTHER): Payer: Medicare Other | Admitting: Internal Medicine

## 2013-06-25 VITALS — BP 120/80 | HR 90 | Temp 98.5°F | Ht 66.5 in | Wt 302.6 lb

## 2013-06-25 DIAGNOSIS — R05 Cough: Secondary | ICD-10-CM

## 2013-06-25 DIAGNOSIS — R059 Cough, unspecified: Secondary | ICD-10-CM

## 2013-06-25 NOTE — Progress Notes (Signed)
Subjective:    Patient ID: [name], female    DOB: Apr 13, 1951   MRN: [id number]   Brief patient profile:  89 yobf never smoker with onset sinus problems in 2010 requiring sinus surgery for nose bleeds by Ezzard Standing which corrected the problem  then onset cough Oct 2013 self -referred to pulmonary clinic 02/12/2013    02/12/2013 1st pulmonary eval on Lotensin cc chronic cough acute onset late Oct 2013 assoc sinus pain that got better p sinaid then developed  productions of purulent sputum to ER Oct 28 2012 dx with acute bronchitis no better with rx with prednisone rx and since then rx with albuterol and levaquin then doxy > makes her dizzy. Cough is worse at hs but minimally productive of mucoid sputum with sense of too much throat and chest mucus and subjective wheeze no better with saba. rec Stop lotensin and start benicar 40 mg one daily and if better in 4 weeks see Dr Hyacinth Meeker, if not see me Try prilosec 20mg   Take 30-60 min before first meal of the day and Pepcid 20 mg one bedtime until cough is completely gone for at least a week without the need for cough suppression Take delsym two tsp every 12 hours and supplement if needed with  tramadol 50 mg up to 2 every 4 hours to suppress the urge to cough. .   GERD diet   04/02/2013 f/u ov/[name] f/u cough Chief Complaint  Patient presents with  . Follow-up    SOB and Cough are the same, no better or worse since the last visit.    Did not follow instructions. Cough worse at hs with min mucoid sputum "ever since that ng tube"   >cough control regimen  04/17/13 Follow up  Returns for follow up  Feels cough is much better; still having some congestion in the AM with cream-colored mucus -   CT sinus 4/24 showed min. Chronic sinusitis  CXR w/ no acute process, LLL atx .  She denies chest pain, orthopnea, edema or feve.r  >>cont on same rx  05/08/2013 Med review  Returns for follow up and med review  We reviewed all her meds and organized them into  a med calendar w/ pt education  Appears she is taking correctly.  Cough is improved but still has some drainage and throat clearing.  rec No change rx   06/25/2013 f/u ov/[name] not using med calendar at all  Chief Complaint  Patient presents with  . Follow-up    Pt states her cough is the same- no better or worse. She also still c/o wheezing-esp in the am's.    cough worse goes outside in am 75% better overall and does not wake her up or bother while sleeping. Not using even delsym now or ventolin at all for prn list.  No excess or purulent sputum production.  No obvious daytime variabilty or assoc sob  or cp or chest tightness, subjective wheeze overt sinus or hb symptoms. No unusual exp hx or h/o childhood pna/ asthma or knowledge of premature birth.   Sleeping ok without nocturnal  or early am exacerbation  of respiratory  c/o's or need for noct saba. Also denies any obvious fluctuation of symptoms with weather or environmental changes or other aggravating or alleviating factors except as outlined above   Current Medications, Allergies, Past Medical History, Past Surgical History, Family History, and Social History were reviewed in [employer] record.  ROS  The following are not active  complaints unless bolded sore throat, dysphagia, dental problems, itching, sneezing,  nasal congestion or excess/ purulent secretions, ear ache,   fever, chills, sweats, unintended wt loss, pleuritic or exertional cp, hemoptysis,  orthopnea pnd or leg swelling, presyncope, palpitations, heartburn, abdominal pain, anorexia, nausea, vomiting, diarrhea  or change in bowel or urinary habits, change in stools or urine, dysuria,hematuria,  rash, arthralgias, visual complaints, headache, numbness weakness or ataxia or problems with walking or coordination,  change in mood/affect or memory.            Objective:   Physical Exam      Wt Readings from Last 3 Encounters:  06/25/13 302 lb  9.6 oz (137.258 kg)  05/08/13 303 lb 12.8 oz (137.803 kg)  04/17/13 300 lb 9.6 oz (136.351 kg)       HEENT: nl dentition, turbinates, and orophanx. Nl external ear canals without cough reflex   NECK :  without JVD/Nodes/TM/ nl carotid upstrokes bilaterally   LUNGS: no acc muscle use, clear to A and P bilaterally without cough on insp or exp maneuvers   CV:  RRR  no s3 or murmur or increase in P2, no edema   ABD:  soft and nontender with nl excursion in the supine position. No bruits or organomegaly, bowel sounds nl  MS:  warm without deformities, calf tenderness, cyanosis or clubbing       CXR  04/02/2013 : Report:  Subtle atelectasis versus infiltrate at retrocardiac left lower  lobe.  My review :  wnl  .      Assessment & Plan:   Outpatient Encounter Prescriptions as of 06/25/2013  Medication Sig Dispense Refill  . albuterol (VENTOLIN HFA) 108 (90 BASE) MCG/ACT inhaler Inhale 2 puffs into the lungs every 4 (four) hours as needed for wheezing or shortness of breath.       Marland Kitchen aspirin 81 MG tablet Take 81 mg by mouth daily.        . chlorpheniramine (CHLOR-TRIMETON) 4 MG tablet 1 tab by mouth at bedtime and may take extra tab every 6 hours as needed      . famotidine (PEPCID) 20 MG tablet Take 20 mg by mouth at bedtime.      . metFORMIN (GLUCOPHAGE) 1000 MG tablet Take 1,000 mg by mouth 2 (two) times daily with a meal.        . Multiple Vitamin (MULTIVITAMIN) tablet Take 1 tablet by mouth daily.        . naproxen sodium (ANAPROX) 220 MG tablet Take 220 mg by mouth every 12 (twelve) hours as needed (joint pain). For pain      . olmesartan-hydrochlorothiazide (BENICAR HCT) 40-12.5 MG per tablet Take 1 tablet by mouth daily.  21 tablet  0  . omeprazole (PRILOSEC) 20 MG capsule Take 1 capsule (20 mg total) by mouth daily before breakfast.  30 capsule  11  . simvastatin (ZOCOR) 20 MG tablet Take 20 mg by mouth every evening.      . traMADol (ULTRAM) 50 MG tablet Take 1 tablet  (50 mg total) by mouth every 8 (eight) hours as needed (for cough or pain).  30 tablet  0  . [DISCONTINUED] dextromethorphan (DELSYM) 30 MG/5ML liquid 2 tsp every 12 hours as needed       No facility-administered encounter medications on file as of 06/25/2013.

## 2013-06-25 NOTE — Patient Instructions (Addendum)
GERD (REFLUX)  is an extremely common cause of respiratory symptoms just like yours, many times with no significant heartburn at all.    It can be treated with medication, but also with lifestyle changes including avoidance of late meals, excessive alcohol, smoking cessation, and avoid fatty foods, chocolate, peppermint, colas, red wine, and acidic juices such as orange juice.  NO MINT OR MENTHOL PRODUCTS SO NO COUGH DROPS  USE SUGARLESS CANDY INSTEAD (jolley ranchers or Stover's)  NO OIL BASED VITAMINS - use powdered substitutes.     If you are satisfied with your treatment plan let your doctor know and he/she can either refill your medications or you can return here when your prescription runs out.     If in any way you are not 100% satisfied,  please tell us.  If 100% better, tell your friends!

## 2013-06-29 NOTE — Assessment & Plan Note (Addendum)
-   Try off acei effective 02/13/13 - Sinus Ct 04/04/13>   Minimal chronic sinusitis changes.   Med calendar 05/08/2013 > not using 06/27/13   At this point she's so much better not inclined to pursue additonal w/u or rx  Overall pattern of cough is  Classic Upper airway cough syndrome, so named because it's frequently impossible to sort out how much is  CR/sinusitis with freq throat clearing (which can be related to primary GERD)   vs  causing  secondary (" extra esophageal")  GERD from wide swings in gastric pressure that occur with throat clearing, often  promoting self use of mint and menthol lozenges that reduce the lower esophageal sphincter tone and exacerbate the problem further in a cyclical fashion.   These are the same pts (now being labeled as having "irritable larynx syndrome" by some cough centers) who not infrequently have a history of having failed to tolerate ace inhibitors (as appears to be the case here),  dry powder inhalers or biphosphonates or report having atypical reflux symptoms that don't respond to standard doses of PPI , and are easily confused as having aecopd or asthma flares by even experienced allergists/ pulmonologists.   For now maintain on max gerd rx plus diet, prn 1st gen H1, and f/u here prn

## 2013-07-04 ENCOUNTER — Telehealth: Payer: Self-pay | Admitting: Internal Medicine

## 2013-07-04 MED ORDER — TRAMADOL HCL 50 MG PO TABS: 50.0000 mg | ORAL_TABLET | Freq: Three times a day (TID) | ORAL | Status: DC | PRN

## 2013-07-04 NOTE — Telephone Encounter (Signed)
Ok to refill x one then needs ov

## 2013-07-04 NOTE — Telephone Encounter (Signed)
Pt calling again re: same. I advised that nurse has sent a msg. To dr. Sherene Sires and nurse will call her when she receives an answer from dr. Hazel Sams

## 2013-07-04 NOTE — Telephone Encounter (Signed)
Pt aware RX was sent. Will call back for appt. Nothing further was needed

## 2013-07-04 NOTE — Telephone Encounter (Signed)
Last OV 06/25/2013 No pending OV Last fill 06/18/2013 #30 1 Q 8hrs prn  MW - please advise if this is okay for refill. Thanks.

## 2013-07-09 ENCOUNTER — Other Ambulatory Visit (HOSPITAL_COMMUNITY): Payer: Self-pay | Admitting: Family Medicine

## 2013-07-09 DIAGNOSIS — Z1231 Encounter for screening mammogram for malignant neoplasm of breast: Secondary | ICD-10-CM

## 2013-07-16 ENCOUNTER — Ambulatory Visit (HOSPITAL_COMMUNITY)
Admission: RE | Admit: 2013-07-16 | Discharge: 2013-07-16 | Disposition: A | Discharge status: Home / Self Care | Payer: Medicare Other | Source: Ambulatory Visit | Attending: Family Medicine | Admitting: Family Medicine

## 2013-07-16 DIAGNOSIS — Z1231 Encounter for screening mammogram for malignant neoplasm of breast: Secondary | ICD-10-CM

## 2014-02-04 ENCOUNTER — Ambulatory Visit: Payer: Commercial Managed Care - HMO | Admitting: Podiatrist

## 2014-02-18 ENCOUNTER — Ambulatory Visit: Payer: Medicare HMO | Admitting: Podiatrist

## 2014-02-27 ENCOUNTER — Encounter: Payer: Self-pay | Admitting: Podiatrist

## 2014-02-27 ENCOUNTER — Ambulatory Visit (INDEPENDENT_AMBULATORY_CARE_PROVIDER_SITE_OTHER): Payer: Commercial Managed Care - HMO | Admitting: Podiatrist

## 2014-02-27 VITALS — BP 107/68 | HR 87 | Resp 18

## 2014-02-27 DIAGNOSIS — T691XXA Chilblains, initial encounter: Secondary | ICD-10-CM

## 2014-02-27 NOTE — Progress Notes (Signed)
° °  Subjective:    Patient ID: [name], female    DOB: [birth year], 63 y.o.   MRN: [id number]  HPI I saw a doctor about a month ago and they did x-rays and he did a shot in my toe and tender and peeling and right now it is doing pretty good right now and if it gets cold that is when it hurts and this is on my 2nd right toe    Review of Systems  HENT: Positive for sinus pressure.        Ringing in ears   Cardiovascular: Positive for leg swelling.  Musculoskeletal:       Joint pain  Neurological: Positive for dizziness and numbness.  All other systems reviewed and are negative.       Objective:   Physical Exam Vascular status reveals palpable pedal pulses DP and PT at 1/4 bilateral. Capillary refill time is increased to the distal digits. Cool to the touch of the distal digits is also noted. Neurological sensation is slightly decreased to the distal second digit bilaterally. Musculoskeletal examination reveals the muscle strength and stability bilateral feet. Dermatological examination reveals a very superficial excoriation of the skin of the distal digits of the right second toe. It appears to have the appearance of chilblains and subjective complaints consistent with chilblains.     Assessment & Plan:  Chilblains  Plan: Recommended that she consistently keep the feet as warm as possible. Did not recommend another injection at this point and she is to watch the toe. If he continues to give her trouble she will let me know we'll consider a topical compounded cream.

## 2014-03-06 ENCOUNTER — Other Ambulatory Visit: Payer: Self-pay | Admitting: Gastroenterology

## 2014-03-06 LAB — HM COLONOSCOPY

## 2014-05-27 ENCOUNTER — Other Ambulatory Visit: Payer: Self-pay | Admitting: Internal Medicine

## 2014-06-01 ENCOUNTER — Telehealth: Payer: Self-pay | Admitting: Internal Medicine

## 2014-06-01 MED ORDER — OMEPRAZOLE 20 MG PO CPDR: 20.0000 mg | DELAYED_RELEASE_CAPSULE | Freq: Every day | ORAL | Status: DC

## 2014-06-01 NOTE — Telephone Encounter (Signed)
Called and spoke with pt and she is aware of appt with MW on 7-14 at 2:15.  Refill of the omeprazole has been sent to the pharmacy and pt is aware.   Pt is aware and nothing further is needed.

## 2014-06-30 ENCOUNTER — Ambulatory Visit (INDEPENDENT_AMBULATORY_CARE_PROVIDER_SITE_OTHER): Payer: Commercial Managed Care - HMO | Admitting: Internal Medicine

## 2014-06-30 ENCOUNTER — Other Ambulatory Visit (INDEPENDENT_AMBULATORY_CARE_PROVIDER_SITE_OTHER): Payer: Commercial Managed Care - HMO

## 2014-06-30 ENCOUNTER — Ambulatory Visit (INDEPENDENT_AMBULATORY_CARE_PROVIDER_SITE_OTHER)
Admission: RE | Admit: 2014-06-30 | Discharge: 2014-06-30 | Disposition: A | Discharge status: Home / Self Care | Payer: Commercial Managed Care - HMO | Source: Ambulatory Visit | Attending: Internal Medicine | Admitting: Internal Medicine

## 2014-06-30 ENCOUNTER — Encounter: Payer: Self-pay | Admitting: Internal Medicine

## 2014-06-30 VITALS — BP 120/76 | HR 90 | Temp 97.6°F | Ht 66.0 in | Wt 316.0 lb

## 2014-06-30 DIAGNOSIS — R059 Cough, unspecified: Secondary | ICD-10-CM

## 2014-06-30 DIAGNOSIS — R05 Cough: Secondary | ICD-10-CM

## 2014-06-30 LAB — CBC WITH DIFFERENTIAL/PLATELET
Basophils Absolute: 0 10*3/uL (ref 0.0–0.1)
Basophils Relative: 0.2 % (ref 0.0–3.0)
Eosinophils Absolute: 0.1 10*3/uL (ref 0.0–0.7)
Eosinophils Relative: 1.3 % (ref 0.0–5.0)
HCT: 36.2 % (ref 36.0–46.0)
Hemoglobin: 11.4 g/dL — ABNORMAL LOW (ref 12.0–15.0)
Lymphocytes Relative: 17.8 % (ref 12.0–46.0)
Lymphs Abs: 1.4 10*3/uL (ref 0.7–4.0)
MCHC: 31.6 g/dL (ref 30.0–36.0)
MCV: 93 fl (ref 78.0–100.0)
Monocytes Absolute: 0.4 10*3/uL (ref 0.1–1.0)
Monocytes Relative: 5.4 % (ref 3.0–12.0)
Neutro Abs: 5.9 10*3/uL (ref 1.4–7.7)
Neutrophils Relative %: 75.3 % (ref 43.0–77.0)
Platelets: 299 10*3/uL (ref 150.0–400.0)
RBC: 3.89 Mil/uL (ref 3.87–5.11)
RDW: 14.5 % (ref 11.5–15.5)
WBC: 7.9 10*3/uL (ref 4.0–10.5)

## 2014-06-30 NOTE — Progress Notes (Signed)
Subjective:    Patient ID: Fartun Paradiso, female    DOB: November 08, 1951   MRN: [id number]    Brief patient profile:  79 yobf never smoker with onset sinus problems in 2010 requiring sinus surgery for nose bleeds by Lucia Gaskins which corrected the problem  then onset cough Oct 2013 self -referred to pulmonary clinic 02/12/2013    History of Present Illness  02/12/2013 1st pulmonary eval on Lotensin cc chronic cough acute onset late Oct 2013 assoc sinus pain that got better p sinaid then developed  productions of purulent sputum to ER Oct 28 2012 dx with acute bronchitis no better with rx with prednisone rx and since then rx with albuterol and levaquin then doxy > makes her dizzy. Cough is worse at hs but minimally productive of mucoid sputum with sense of too much throat and chest mucus and subjective wheeze no better with saba. rec Stop lotensin and start benicar 40 mg one daily and if better in 4 weeks see Dr [name] Heck, if not see me Try prilosec 20mg   Take 30-60 min before first meal of the day and Pepcid 20 mg one bedtime until cough is completely gone for at least a week without the need for cough suppression Take delsym two tsp every 12 hours and supplement if needed with  tramadol 50 mg up to 2 every 4 hours to suppress the urge to cough. .   GERD diet   04/02/2013 f/u ov/Nataliee Shurtz f/u cough Chief Complaint  Patient presents with  . Follow-up    SOB and Cough are the same, no better or worse since the last visit.   Did not follow instructions. Cough worse at hs with min mucoid sputum "ever since that ng tube"   >cough control regimen  04/17/13 Follow up  Returns for follow up  Feels cough is much better; still having some congestion in the AM with cream-colored mucus -   CT sinus 4/24 showed min. Chronic sinusitis  CXR w/ no acute process, LLL atx .  She denies chest pain, orthopnea, edema or feve.r  >>cont on same rx  05/08/2013 Med review  Returns for follow up and med review  We reviewed all her  meds and organized them into a med calendar w/ pt education  Appears she is taking correctly.  Cough is improved but still has some drainage and throat clearing.  rec No change rx   06/25/2013 f/u ov/Dalexa Gentz/ chronic cough/  not using med calendar at all  Chief Complaint  Patient presents with  . Follow-up    Pt states her cough is the same- no better or worse. She also still c/o wheezing-esp in the am's.   cough worse goes outside in am 75% better overall and does not wake her up or bother while sleeping. Not using even delsym now or ventolin at all for prn list.  No excess or purulent sputum production. rec GERD diet    06/30/2014 f/u ov/Persephonie Hegwood re: chronic cough  Chief Complaint  Patient presents with  . Follow-up    Pt states she is unsure if her cough is better, worse or the same since her last visit.   mostly dry cough each am p waking and stir around for 15 m -still overall about 75% better than baseline. Not using saba or cough suppression but confused with meds/ instructions   No obvious daytime variabilty or assoc sob  or cp or chest tightness, subjective wheeze overt sinus or hb symptoms. No unusual exp hx or  h/o childhood pna/ asthma or knowledge of premature birth.   Sleeping ok without nocturnal  or early am exacerbation  of respiratory  c/o's or need for noct saba. Also denies any obvious fluctuation of symptoms with weather or environmental changes or other aggravating or alleviating factors except as outlined above   Current Medications, Allergies, Past Medical History, Past Surgical History, Family History, and Social History were reviewed in [employer] record.  ROS  The following are not active complaints unless bolded sore throat, dysphagia, dental problems, itching, sneezing,  nasal congestion or excess/ purulent secretions, ear ache,   fever, chills, sweats, unintended wt loss, pleuritic or exertional cp, hemoptysis,  orthopnea pnd or leg  swelling, presyncope, palpitations, heartburn, abdominal pain, anorexia, nausea, vomiting, diarrhea  or change in bowel or urinary habits, change in stools or urine, dysuria,hematuria,  rash, arthralgias, visual complaints, headache, numbness weakness or ataxia or problems with walking or coordination,  change in mood/affect or memory.            Objective:   Physical Exam     06/30/2014       316  Wt Readings from Last 3 Encounters:  06/25/13 302 lb 9.6 oz (137.258 kg)  05/08/13 303 lb 12.8 oz (137.803 kg)  04/17/13 300 lb 9.6 oz (136.351 kg)       HEENT: nl dentition, turbinates, and orophanx. Nl external ear canals without cough reflex   NECK :  without JVD/Nodes/TM/ nl carotid upstrokes bilaterally   LUNGS: no acc muscle use, clear to A and P bilaterally without cough on insp or exp maneuvers   CV:  RRR  no s3 or murmur or increase in P2, no edema   ABD:  soft and nontender with nl excursion in the supine position. No bruits or organomegaly, bowel sounds nl  MS:  warm without deformities, calf tenderness, cyanosis or clubbing       CXR  06/30/2014 :  No active cardiopulmonary disease.    .      Assessment & Plan:

## 2014-06-30 NOTE — Patient Instructions (Addendum)
Pepcid ac 20 mg and chlorpheniramine 4 mg x 2 at bedtime and stop omeprazole   GERD (REFLUX)  is an extremely common cause of respiratory symptoms, many times with no significant heartburn at all.    It can be treated with medication, but also with lifestyle changes including avoidance of late meals, excessive alcohol, smoking cessation, and avoid fatty foods, chocolate, peppermint, colas, red wine, and acidic juices such as orange juice.  NO MINT OR MENTHOL PRODUCTS SO NO COUGH DROPS  USE SUGARLESS CANDY INSTEAD (jolley ranchers or Stover's)  NO OIL BASED VITAMINS - use powdered substitutes.  Please remember to go to the lab and x-ray department downstairs for your tests - we will call you with the results when they are available.       If not satisfied with am cough or if the daytime cough gets worse off omeprazole > See Tammy NP w/in 2 weeks with all your medications, even over the counter meds, separated in two separate bags, the ones you take no matter what vs the ones you stop once you feel better and take only as needed when you feel you need them.   Tammy  will generate for you a new user friendly medication calendar that will put [place] all on the same page re: your medication use.     Without this process, it simply isn't possible to assure that we are providing  your outpatient care  with  the attention to detail we feel you deserve.   If we cannot assure that you're getting that kind of care,  then we cannot manage your problem effectively from this clinic.  Once you have seen Tammy and we are sure that we're all on the same page with your medication use she will arrange follow up with me.

## 2014-07-01 ENCOUNTER — Encounter: Payer: Self-pay | Admitting: Internal Medicine

## 2014-07-01 LAB — ALLERGY FULL PROFILE
Allergen, D pternoyssinus,d7: <0.1 kU/L
Allergen,Goose feathers, e70: <0.1 kU/L
Alternaria Alternata: <0.1 kU/L
Aspergillus fumigatus, m3: <0.1 kU/L
Bahia Grass: <0.1 kU/L
Bermuda Grass: <0.1 kU/L
Box Elder IgE: <0.1 kU/L
Candida Albicans: <0.1 kU/L
Cat Dander: <0.1 kU/L
Common Ragweed: <0.1 kU/L
Curvularia lunata: <0.1 kU/L
D. farinae: <0.1 kU/L
Dog Dander: <0.1 kU/L
Elm IgE: <0.1 kU/L
Fescue: <0.1 kU/L
G005 Rye, Perennial: <0.1 kU/L
G009 Red Top: <0.1 kU/L
Goldenrod: <0.1 kU/L
Helminthosporium halodes: <0.1 kU/L
House Dust Hollister: <0.1 kU/L
IgE (Immunoglobulin E), Serum: 38 IU/mL (ref 0.0–180.0)
Lamb's Quarters: <0.1 kU/L
Oak: <0.1 kU/L
Plantain: <0.1 kU/L
Stemphylium Botryosum: <0.1 kU/L
Sycamore Tree: <0.1 kU/L
Timothy Grass: <0.1 kU/L

## 2014-07-01 NOTE — Progress Notes (Signed)
Quick Note:  Spoke with pt and notified of results per Dr. Wert. Pt verbalized understanding and denied any questions.  ______ 

## 2014-07-02 NOTE — Assessment & Plan Note (Signed)
-   Try off acei effective 02/13/13 - Sinus Ct 04/04/13>   Minimal chronic sinusitis changes - allergy profile 06/29/14 :  IgE 38 with neg RAST and no Eos - trial off omeprazole to see if daytime cough flares 06/30/14    Med calendar 05/08/2013 > not using 06/27/13    Dis The standardized cough guidelines published in Chest by Lissa Morales in 2006 are still the best available and consist of a multiple step process (up to 12!) , not a single office visit,  and are intended  to address this problem logically,  with an alogrithm dependent on response to empiric treatment at  each progressive step  to determine a specific diagnosis with  minimal addtional testing needed. Therefore if adherence is an issue or can't be accurately verified,  it's very unlikely the standard evaluation and treatment will be successful here.    Furthermore, response to therapy (other than acute cough suppression, which should only be used short term with avoidance of narcotic containing cough syrups if possible), can be a gradual process for which the patient may perceive immediate benefit.  Unlike going to an eye doctor where the best perscription is almost always the first one and is immediately effective, this is almost never the case in the management of chronic cough syndromes. Therefore the patient needs to commit up front to consistently adhere to recommendations  for up to 6 weeks of therapy directed at the likely underlying problem(s) before the response can be reasonably evaluated.   Next step therefore is med reconciliation.  To keep things simple, I have asked the patient to first separate medicines that are perceived as maintenance, that is to be taken daily "no matter what", from those medicines that are taken on only on an as-needed basis and I have given the patient examples of both, and then return to see our NP to generate a  detailed  medication calendar which should be followed until the next physician sees the  patient and updates it.

## 2014-07-03 ENCOUNTER — Other Ambulatory Visit: Payer: Self-pay | Admitting: Orthopedic Surgery

## 2014-07-03 ENCOUNTER — Encounter (HOSPITAL_BASED_OUTPATIENT_CLINIC_OR_DEPARTMENT_OTHER): Payer: Self-pay | Admitting: *Deleted

## 2014-07-03 NOTE — Progress Notes (Signed)
To come in for ekg-bmet-denies any resp or cardiac problems

## 2014-07-03 NOTE — Progress Notes (Signed)
07/03/14 1305  OBSTRUCTIVE SLEEP APNEA  Have you ever been diagnosed with sleep apnea through a sleep study? No  Do you snore loudly (loud enough to be heard through closed doors)?  0  Do you often feel tired, fatigued, or sleepy during the daytime? 0  Has anyone observed you stop breathing during your sleep? 0  Do you have, or are you being treated for high blood pressure? 1  BMI more than 35 kg/m2? 1  Age over 61 years old? 1  Neck circumference greater than 40 cm/16 inches? 1  Gender: 0  Obstructive Sleep Apnea Score 4  Score 4 or greater  Results sent to PCP

## 2014-07-08 NOTE — H&P (Signed)
[name] is an 63 y.o. female.   CC / Reason for Visit: Bilateral upper extremity problems HPI: This patient returns for reevaluation, having undergone electrodiagnostic studies and MRI scan since I last evaluated her.  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  The ulnar nerves appear to be at the level of the elbow.  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  It does not apparently bother the patient.  She reports that if she uses her hands, many of the fingers fall asleep.  Her left side is worse than the other.    Presenting history follows: This patient is a 63 year old female who presents for evaluation of her bilateral upper extremities.  She reports that as far back as 1999 she had numbness and tingling in her hands and had electrodiagnostic studies confirming the diagnosis of carpal tunnel syndrome.  She has worn nighttime braces for a long time but not had any surgery.  In addition more recently she has developed activity related pain at the bases of both thumbs, right worse than left.  The numbness tingling and burning tends to be worse at nighttime, and with activities such as driving or speaking on the phone.  She had today when she was filling on her paperwork.  Hanging the hand-dependent tends to be helpful.  She has also had a chronic mass that has been getting larger in the right first webspace.  It is not painful.  Past Medical History  Diagnosis Date  . Diabetes mellitus   . Hypertension   . Obesity   . Abdominal hernia   . Arthritis     knees  . Edema     legs  . HOH (hard of hearing)     Past Surgical History  Procedure Laterality Date  . Hernia repair  07/03/2011    abd hernia  . Ankle fracture surgery  1998?    right  . Nose surgery  2008    sinus  . Colonoscopy      Family  History  Problem Relation Age of Onset  . Hypertension Mother   . Kidney disease Mother   . Diabetes Father   . Hypertension Father   . Diabetes Sister   . Hypertension Sister   . Diabetes Brother   . Hypertension Brother   . Heart disease Brother 56    heart attack  . Hypertension Sister    Social History:  reports that she has never smoked. She has never used smokeless tobacco. She reports that she does not drink alcohol or use illicit drugs.  Allergies:  Allergies  Allergen Reactions  . Sulfa Antibiotics Rash    No prescriptions prior to admission    No results found for this or any previous visit (from the past 48 hour(s)). No results found.  Review of Systems  All other systems reviewed and are negative.   Height 5\' 6"  (1.676 m), weight 142.883 kg (315 lb). Physical Exam  Constitutional:  WD, WN, NAD HEENT:  NCAT, EOMI Neuro/Psych:  Alert & oriented to person, place, and time; appropriate mood & affect Lymphatic: No generalized UE edema or lymphadenopathy Extremities / MSK:  Both UE are normal with respect to appearance, ranges of motion, joint stability, muscle strength/tone, sensation, & perfusion except as otherwise noted:  Bilaterally, she has some flattening of the opponens  muscles area Phalen's maneuver on the right causes symptoms into the long and ring fingers, negative on the left.  Negative Tinel's at the wrist and elbow, negative elbow flexion test.  Increased pain bilaterally with TMC stress and grind testing.  Monofilaments 4.31 right thumb, 3.61 in all the other digits bilaterally.  There is a 3 cm firm mass that is somewhat mobile in the distal right first webspace.  At present, she reports on the right side she has tingling on the volar surfaces of the index to small fingers, but no tingling dorsally.  On the left side all of her fingers tingle including the thumb, but not as much the small finger.  Labs / X-rays:  No radiographic studies obtained  today.   Assessment: 1.  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 2.  Bilateral TMC osteoarthritis 3.  Bilateral carpal tunnel syndrome 4.  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  If one were to proceed surgically with all of these issues, it would require surgery at 5 different distinct sites.  She reports she would like to leave the right hand lipoma alone for now, but would like to proceed with the left hand endoscopic carpal tunnel release given that most of her symptoms on the left hand involve the median digits and not the small finger.  I think this is reasonable and will be scheduled accordingly to her wishes once surgical authorization is received.   [name] A. 07/08/2014, 6:14 PM

## 2014-07-09 ENCOUNTER — Encounter (HOSPITAL_BASED_OUTPATIENT_CLINIC_OR_DEPARTMENT_OTHER)
Admission: RE | Admit: 2014-07-09 | Discharge: 2014-07-09 | Disposition: A | Discharge status: Home / Self Care | Payer: Medicare PPO | Source: Ambulatory Visit | Attending: Orthopedic Surgery | Admitting: Orthopedic Surgery

## 2014-07-09 DIAGNOSIS — Z01810 Encounter for preprocedural cardiovascular examination: Secondary | ICD-10-CM | POA: Diagnosis not present

## 2014-07-09 DIAGNOSIS — H9190 Unspecified hearing loss, unspecified ear: Secondary | ICD-10-CM | POA: Diagnosis not present

## 2014-07-09 DIAGNOSIS — M1710 Unilateral primary osteoarthritis, unspecified knee: Secondary | ICD-10-CM | POA: Diagnosis not present

## 2014-07-09 DIAGNOSIS — I10 Essential (primary) hypertension: Secondary | ICD-10-CM | POA: Diagnosis not present

## 2014-07-09 DIAGNOSIS — Z01812 Encounter for preprocedural laboratory examination: Secondary | ICD-10-CM | POA: Diagnosis not present

## 2014-07-09 DIAGNOSIS — E669 Obesity, unspecified: Secondary | ICD-10-CM | POA: Diagnosis not present

## 2014-07-09 DIAGNOSIS — Z6841 Body Mass Index (BMI) 40.0 and over, adult: Secondary | ICD-10-CM | POA: Diagnosis not present

## 2014-07-09 DIAGNOSIS — E119 Type 2 diabetes mellitus without complications: Secondary | ICD-10-CM | POA: Diagnosis not present

## 2014-07-09 DIAGNOSIS — G5600 Carpal tunnel syndrome, unspecified upper limb: Secondary | ICD-10-CM | POA: Diagnosis not present

## 2014-07-09 LAB — BASIC METABOLIC PANEL
Anion gap: 14 (ref 5–15)
BUN: 20 mg/dL (ref 6–23)
CO2: 27 mEq/L (ref 19–32)
Calcium: 9.3 mg/dL (ref 8.4–10.5)
Chloride: 101 mEq/L (ref 96–112)
Creatinine, Ser: 1.07 mg/dL (ref 0.50–1.10)
GFR calc Af Amer: 64 mL/min — ABNORMAL LOW (ref >90)
GFR calc non Af Amer: 55 mL/min — ABNORMAL LOW (ref >90)
Glucose, Bld: 128 mg/dL — ABNORMAL HIGH (ref 70–99)
Potassium: 4.4 mEq/L (ref 3.7–5.3)
Sodium: 142 mEq/L (ref 137–147)

## 2014-07-10 ENCOUNTER — Encounter (HOSPITAL_BASED_OUTPATIENT_CLINIC_OR_DEPARTMENT_OTHER): Payer: Medicare PPO | Admitting: Anesthesiology

## 2014-07-10 ENCOUNTER — Encounter (HOSPITAL_BASED_OUTPATIENT_CLINIC_OR_DEPARTMENT_OTHER): Payer: Self-pay | Admitting: *Deleted

## 2014-07-10 ENCOUNTER — Encounter (HOSPITAL_BASED_OUTPATIENT_CLINIC_OR_DEPARTMENT_OTHER)
Admission: RE | Disposition: A | Discharge status: Home / Self Care | Payer: Self-pay | Source: Ambulatory Visit | Attending: Orthopedic Surgery

## 2014-07-10 ENCOUNTER — Ambulatory Visit (HOSPITAL_BASED_OUTPATIENT_CLINIC_OR_DEPARTMENT_OTHER)
Admission: RE | Admit: 2014-07-10 | Discharge: 2014-07-10 | Disposition: A | Discharge status: Home / Self Care | Payer: Medicare PPO | Source: Ambulatory Visit | Attending: Orthopedic Surgery | Admitting: Orthopedic Surgery

## 2014-07-10 ENCOUNTER — Ambulatory Visit (HOSPITAL_BASED_OUTPATIENT_CLINIC_OR_DEPARTMENT_OTHER): Payer: Medicare PPO | Admitting: Anesthesiology

## 2014-07-10 DIAGNOSIS — H9190 Unspecified hearing loss, unspecified ear: Secondary | ICD-10-CM | POA: Insufficient documentation

## 2014-07-10 DIAGNOSIS — G5600 Carpal tunnel syndrome, unspecified upper limb: Secondary | ICD-10-CM | POA: Insufficient documentation

## 2014-07-10 DIAGNOSIS — E669 Obesity, unspecified: Secondary | ICD-10-CM | POA: Diagnosis not present

## 2014-07-10 DIAGNOSIS — E119 Type 2 diabetes mellitus without complications: Secondary | ICD-10-CM | POA: Insufficient documentation

## 2014-07-10 DIAGNOSIS — M1710 Unilateral primary osteoarthritis, unspecified knee: Secondary | ICD-10-CM | POA: Insufficient documentation

## 2014-07-10 DIAGNOSIS — Z6841 Body Mass Index (BMI) 40.0 and over, adult: Secondary | ICD-10-CM | POA: Insufficient documentation

## 2014-07-10 DIAGNOSIS — I10 Essential (primary) hypertension: Secondary | ICD-10-CM | POA: Insufficient documentation

## 2014-07-10 DIAGNOSIS — Z01812 Encounter for preprocedural laboratory examination: Secondary | ICD-10-CM | POA: Insufficient documentation

## 2014-07-10 DIAGNOSIS — Z01810 Encounter for preprocedural cardiovascular examination: Secondary | ICD-10-CM | POA: Insufficient documentation

## 2014-07-10 DIAGNOSIS — IMO0002 Reserved for concepts with insufficient information to code with codable children: Secondary | ICD-10-CM

## 2014-07-10 HISTORY — DX: Unspecified hearing loss, unspecified ear: H91.90

## 2014-07-10 HISTORY — PX: CARPAL TUNNEL RELEASE: SHX101

## 2014-07-10 LAB — POCT HEMOGLOBIN-HEMACUE: Hemoglobin: 12.1 g/dL (ref 12.0–15.0)

## 2014-07-10 LAB — GLUCOSE, CAPILLARY: Glucose-Capillary: 124 mg/dL — ABNORMAL HIGH (ref 70–99)

## 2014-07-10 SURGERY — RELEASE, CARPAL TUNNEL, ENDOSCOPIC
Anesthesia: Monitor Anesthesia Care | Site: Wrist | Laterality: Left

## 2014-07-10 MED ORDER — BUPIVACAINE-EPINEPHRINE (PF) 0.5% -1:200000 IJ SOLN
INTRAMUSCULAR | Status: AC
  Filled 2014-07-10: qty 30

## 2014-07-10 MED ORDER — MIDAZOLAM HCL 5 MG/5ML IJ SOLN
INTRAMUSCULAR | Status: DC | PRN
  Administered 2014-07-10: 1 mg via INTRAVENOUS

## 2014-07-10 MED ORDER — MIDAZOLAM HCL 2 MG/2ML IJ SOLN: 1.0000 mg | INTRAMUSCULAR | Status: DC | PRN

## 2014-07-10 MED ORDER — LIDOCAINE HCL (CARDIAC) 20 MG/ML IV SOLN
INTRAVENOUS | Status: DC | PRN
  Administered 2014-07-10: 50 mg via INTRAVENOUS

## 2014-07-10 MED ORDER — LACTATED RINGERS IV SOLN
INTRAVENOUS | Status: DC
  Administered 2014-07-10: 14:00:00 via INTRAVENOUS

## 2014-07-10 MED ORDER — DEXTROSE 5 % IV SOLN
3.0000 g | INTRAVENOUS | Status: AC
  Administered 2014-07-10: 3 g via INTRAVENOUS

## 2014-07-10 MED ORDER — MIDAZOLAM HCL 2 MG/2ML IJ SOLN
INTRAMUSCULAR | Status: AC
  Filled 2014-07-10: qty 2

## 2014-07-10 MED ORDER — CEFAZOLIN SODIUM 1-5 GM-% IV SOLN
INTRAVENOUS | Status: AC
  Filled 2014-07-10: qty 50

## 2014-07-10 MED ORDER — PROPOFOL INFUSION 10 MG/ML OPTIME
INTRAVENOUS | Status: DC | PRN
  Administered 2014-07-10: 100 ug/kg/min via INTRAVENOUS

## 2014-07-10 MED ORDER — OXYCODONE-ACETAMINOPHEN 5-325 MG PO TABS: 1.0000 | ORAL_TABLET | ORAL | Status: DC | PRN

## 2014-07-10 MED ORDER — CEFAZOLIN SODIUM-DEXTROSE 2-3 GM-% IV SOLR
INTRAVENOUS | Status: AC
  Filled 2014-07-10: qty 50

## 2014-07-10 MED ORDER — ONDANSETRON HCL 4 MG/2ML IJ SOLN
INTRAMUSCULAR | Status: DC | PRN
  Administered 2014-07-10: 4 mg via INTRAVENOUS

## 2014-07-10 MED ORDER — BUPIVACAINE-EPINEPHRINE 0.5% -1:200000 IJ SOLN
INTRAMUSCULAR | Status: DC | PRN
  Administered 2014-07-10: 2.5 mL

## 2014-07-10 MED ORDER — CHLORHEXIDINE GLUCONATE 4 % EX LIQD: 60.0000 mL | Freq: Once | CUTANEOUS | Status: DC

## 2014-07-10 MED ORDER — LACTATED RINGERS IV SOLN
INTRAVENOUS | Status: DC
  Administered 2014-07-10: 13:00:00 via INTRAVENOUS

## 2014-07-10 MED ORDER — FENTANYL CITRATE 0.05 MG/ML IJ SOLN: 50.0000 ug | INTRAMUSCULAR | Status: DC | PRN

## 2014-07-10 MED ORDER — FENTANYL CITRATE 0.05 MG/ML IJ SOLN
INTRAMUSCULAR | Status: DC | PRN
  Administered 2014-07-10 (×2): 25 ug via INTRAVENOUS
  Administered 2014-07-10: 50 ug via INTRAVENOUS

## 2014-07-10 MED ORDER — LIDOCAINE HCL 2 % IJ SOLN
INTRAMUSCULAR | Status: DC | PRN
Start: 2014-07-10 — End: 2014-07-10
  Administered 2014-07-10: 2.5 mL

## 2014-07-10 MED ORDER — FENTANYL CITRATE 0.05 MG/ML IJ SOLN
INTRAMUSCULAR | Status: AC
  Filled 2014-07-10: qty 2

## 2014-07-10 SURGICAL SUPPLY — 36 items
APPLICATOR COTTON TIP 6IN STRL (MISCELLANEOUS) ×4 IMPLANT
BLADE HOOK ENDO STRL (BLADE) ×2 IMPLANT
BLADE SURG 15 STRL LF DISP TIS (BLADE) ×1 IMPLANT
BLADE SURG 15 STRL SS (BLADE) ×1
BLADE TRIANGLE EPF/EGR ENDO (BLADE) ×2 IMPLANT
BNDG COHESIVE 4X5 TAN STRL (GAUZE/BANDAGES/DRESSINGS) ×2 IMPLANT
BNDG ESMARK 4X9 LF (GAUZE/BANDAGES/DRESSINGS) ×2 IMPLANT
BNDG GAUZE ELAST 4 BULKY (GAUZE/BANDAGES/DRESSINGS) ×2 IMPLANT
CHLORAPREP W/TINT 26ML (MISCELLANEOUS) ×2 IMPLANT
CORDS BIPOLAR (ELECTRODE) IMPLANT
COVER MAYO STAND STRL (DRAPES) ×2 IMPLANT
COVER TABLE BACK 60X90 (DRAPES) ×2 IMPLANT
CUFF TOURNIQUET SINGLE 18IN (TOURNIQUET CUFF) ×2 IMPLANT
DRAIN TLS ROUND 10FR (DRAIN) ×2 IMPLANT
DRAPE EXTREMITY T 121X128X90 (DRAPE) ×2 IMPLANT
DRAPE SURG 17X23 STRL (DRAPES) ×2 IMPLANT
DRSG EMULSION OIL 3X3 NADH (GAUZE/BANDAGES/DRESSINGS) ×2 IMPLANT
GAUZE SPONGE 4X4 12PLY STRL (GAUZE/BANDAGES/DRESSINGS) ×2 IMPLANT
GLOVE BIO SURGEON STRL SZ7.5 (GLOVE) ×2 IMPLANT
GLOVE BIOGEL PI IND STRL 8 (GLOVE) ×1 IMPLANT
GLOVE BIOGEL PI INDICATOR 8 (GLOVE) ×1
GOWN STRL REUS W/ TWL LRG LVL3 (GOWN DISPOSABLE) ×2 IMPLANT
GOWN STRL REUS W/TWL LRG LVL3 (GOWN DISPOSABLE) ×2
NEEDLE HYPO 25X1 1.5 SAFETY (NEEDLE) ×2 IMPLANT
NS IRRIG 1000ML POUR BTL (IV SOLUTION) ×2 IMPLANT
PACK BASIN DAY SURGERY FS (CUSTOM PROCEDURE TRAY) ×2 IMPLANT
PADDING CAST ABS 4INX4YD NS (CAST SUPPLIES)
PADDING CAST ABS COTTON 4X4 ST (CAST SUPPLIES) IMPLANT
STOCKINETTE 4X48 STRL (DRAPES) ×2 IMPLANT
SUT VICRYL RAPIDE 4-0 (SUTURE) ×2 IMPLANT
SYR BULB 3OZ (MISCELLANEOUS) ×2 IMPLANT
SYRINGE 12CC LL (MISCELLANEOUS) ×2 IMPLANT
SYSTEM CHEST DRAIN TLS 7FR (DRAIN) IMPLANT
TOWEL OR 17X24 6PK STRL BLUE (TOWEL DISPOSABLE) ×6 IMPLANT
TOWEL OR NON WOVEN STRL DISP B (DISPOSABLE) IMPLANT
UNDERPAD 30X30 INCONTINENT (UNDERPADS AND DIAPERS) ×2 IMPLANT

## 2014-07-10 NOTE — Anesthesia Preprocedure Evaluation (Signed)
Anesthesia Evaluation  Patient identified by MRN, date of birth, ID band Patient awake    Reviewed: Allergy & Precautions, H&P , NPO status , Patient's Chart, lab work & pertinent test results  History of Anesthesia Complications Negative for: history of anesthetic complications  Airway Mallampati: I  Neck ROM: Full    Dental  (+) Edentulous Upper   Pulmonary shortness of breath,          Cardiovascular hypertension, Rhythm:Regular Rate:Normal     Neuro/Psych    GI/Hepatic   Endo/Other  diabetes  Renal/GU      Musculoskeletal   Abdominal   Peds  Hematology   Anesthesia Other Findings   Reproductive/Obstetrics                           Anesthesia Physical Anesthesia Plan  ASA: III  Anesthesia Plan: MAC   Post-op Pain Management:    Induction: Intravenous  Airway Management Planned: Natural Airway and Simple Face Mask  Additional Equipment:   Intra-op Plan:   Post-operative Plan:   Informed Consent: I have reviewed the patients History and Physical, chart, labs and discussed the procedure including the risks, benefits and alternatives for the proposed anesthesia with the patient or authorized representative who has indicated his/her understanding and acceptance.     Plan Discussed with: CRNA and Surgeon  Anesthesia Plan Comments:         Anesthesia Quick Evaluation

## 2014-07-10 NOTE — Anesthesia Postprocedure Evaluation (Signed)
  Anesthesia Post-op Note  Patient: [name]  Procedure(s) Performed: Procedure(s): LEFT ENDOSCOPIC CARPAL TUNNEL RELEASE  (Left)  Patient Location: PACU  Anesthesia Type:MAC  Level of Consciousness: awake and alert   Airway and Oxygen Therapy: Patient Spontanous Breathing  Post-op Pain: mild  Post-op Assessment: Post-op Vital signs reviewed  Post-op Vital Signs: stable  Last Vitals:  Filed Vitals:   07/10/14 1600  BP: 128/71  Pulse: 76  Temp: 36.3 C  Resp: 16    Complications: No apparent anesthesia complications

## 2014-07-10 NOTE — Transfer of Care (Signed)
Immediate Anesthesia Transfer of Care Note  Patient: [name]  Procedure(s) Performed: Procedure(s): LEFT ENDOSCOPIC CARPAL TUNNEL RELEASE  (Left)  Patient Location: PACU  Anesthesia Type:MAC  Level of Consciousness: awake, alert  and patient cooperative  Airway & Oxygen Therapy: Patient Spontanous Breathing and Patient connected to face mask oxygen  Post-op Assessment: Report given to PACU RN and Post -op Vital signs reviewed and stable  Post vital signs: Reviewed and stable  Complications: No apparent anesthesia complications

## 2014-07-10 NOTE — Op Note (Signed)
07/10/2014  1:52 PM  PATIENT:  [name]  63 y.o. female  PRE-OPERATIVE DIAGNOSIS:  LEFT CARPAL TUNNEL SYNDROME  POST-OPERATIVE DIAGNOSIS:  Same  PROCEDURE:  Procedure(s): LEFT ENDOSCOPIC CARPAL TUNNEL RELEASE   SURGEON:  Surgeon(s): Jolyn Nap, MD  PHYSICIAN ASSISTANT: None  ANESTHESIA:  Local / MAC  SPECIMENS:  None  DRAINS:   None  PREOPERATIVE INDICATIONS:  [name] is a  63 y.o. female with a diagnosis of LEFT CARPAL TUNNEL SYNDROME who failed conservative measures and elected for surgical management.    The risks benefits and alternatives were discussed with the patient preoperatively including but not limited to the risks of infection, bleeding, nerve injury, cardiopulmonary complications, the need for revision surgery, among others, and the patient verbalized understanding and consented to proceed.  OPERATIVE IMPLANTS: None  OPERATIVE FINDINGS: Tight carpal tunnel, acceptably decompressed following release  OPERATIVE PROCEDURE:  After receiving prophylactic antibiotics, the patient was escorted to the operative theatre and placed in a supine position.   A surgical "time-out" was performed during which the planned procedure, proposed operative site, and the correct patient identity were compared to the operative consent and agreement confirmed by the circulating nurse according to current facility policy.  Following application of a tourniquet to the operative extremity, the planned incisions were marked and anesthetized with a mixture of lidocaine & bupivicaine containing epinephrine.  The exposed skin was prepped with Chloraprep and draped in the usual sterile fashion.  The limb was exsanguinated with an Esmarch bandage and the tourniquet inflated to approximately 14mmHg higher than systolic BP.   The incisions were made sharply. Subcutaneous tissues were dissected with blunt and spreading dissection. At the proximal incision, the deep forearm fascia was split  in line with the skin incision the distal edge grasped with a hemostat. At the mid palmar incision, the palmar fascia was split in line with the skin incision, revealing the underlying superficial palmar arch which was visualized with loupe assisted magnification. The synovial reflector was then introduced into the proximal incision and passed through the carpal canal, uses to reflect synovium from the deep surface of the transverse carpal ligament. It was removed and replaced with the slotted cannula and blunt obturator, passing from proximal to distal and exiting the distal wound superficial to the superficial palmar arch. The obturator was removed and the camera inserted. Visualization of the ligament was acceptable. The triangle shape blade was then inserted distally, advanced to the midportion of the ligament, and there used to create a perforation in the ligament. This instrument was removed and the hooked nstrument was inserted. It was placed into the perforation in the ligament and withdrawn distally, completing transection of the distal half of the ligament. The camera was then removed and placed into the distal end of the cannula. The hooked instrument was placed into the proximal end and advanced facility to be placed into the apex of the V. which had been formed to the distal hemi-transection of the ligament. It was withdrawn proximally, completing transection of the ligament. The adequacy of the release was judged with the scope and the instruments used as a probe. All of the endoscopic instruments are removed and the adequacy of release was again judged from the proximal incision perspective with direct loupe assisted visualization. In addition, the proximal forearm fascia was split for 2 inches proximal to the proximal incision under direct visualization using a sliding scissor technique. The tourniquet was released, the wound copiously irrigated. A TLS drain was  placed to lie alongside the nerve  exiting its own skin hole distally made with some sharp trocar. The skin was then closed with 4-0 Vicryl Rapide interrupted sutures. A light dressing was applied and the balance of 10 mL of the initial anesthetic mixture was placed down the drain, and the drain was clamped to be unclamped in the recovery room.  DISPOSITION:  The patient will be discharged home today, returning in 10-15 days for re-assessment.

## 2014-07-10 NOTE — Discharge Instructions (Signed)
Discharge Instructions ° ° °You have a light dressing on your hand.  °You may begin gentle motion of your fingers and hand immediately, but you should not do any heavy lifting or gripping.  Elevate your hand to reduce pain & swelling of the digits.  Ice over the operative site may be helpful to reduce pain & swelling.  DO NOT USE HEAT. °Pain medicine has been prescribed for you.  °Use your medicine as needed over the first 48 hours, and then you can begin to taper your use. You may use Tylenol in place of your prescribed pain medication, but not IN ADDITION to it. °Leave the dressing in place until the third day after your surgery and then remove it, leaving it open to air.  °After the bandage has been removed you may shower, but do not soak the incision.  °You may drive a car when you are off of prescription pain medications and can safely control your vehicle with both hands. °We will address whether therapy will be required or not when you return to the office. °You may have already made your follow-up appointment when we completed your preop visit.  If not, please call our office today or the next business day to make your return appointment for 10-15 days after surgery. ° ° °Please call [phone] during normal business hours or [phone] after hours for any problems. Including the following: ° °- excessive redness of the incisions °- drainage for more than 4 days °- fever of more than 101.5 F ° °*Please note that pain medications will not be refilled after hours or on weekends. ° ° °TLS Drain Instructions °You have a drain tube in place to help limit the amount of blood that collects under your skin in the early post-operative period.  The amount it drains will vary from person-to-person and is dependent upon many factors.  You will have 1-2 extra drain tubes sent with you from the facility.  You should change the tube according to these instructions below when the tube is about half full. ° °BE SURE TO  SLIDE THE CLAMP TO THE CLOSED POSITION BEFORE CHANGING THE TUBE.   ° °RE-OPEN THE CLAMP ONCE THE GLASS EVACUATION TUBE HAS BEEN CHANGED. ° °24 hours after surgery the amount of drainage will likely be negligible and it will be time to remove the tube and discard it.  Simply remove the tape that secures the flexible plastic drainage tube to the bandage and briskly pull the tube straight out.  You will likely feel a little discomfort during this process, but the tube should slide out as it is not sewn or otherwise secured directly to your body.  It is merely held in place by the bandage itself.  °                           ° °If you are not clear about when your first post-op appointment is scheduled, please call the office on the next business day to inquire about it.  Please also call the office if you have any other questions ([phone]).   ° °Post Anesthesia Home Care Instructions ° °Activity: °Get plenty of rest for the remainder of the day. A responsible adult should stay with you for 24 hours following the procedure.  °For the next 24 hours, DO NOT: °-Drive a car °-Operate machinery °-Drink alcoholic beverages °-Take any medication unless instructed by your physician °-Make any legal decisions or   sign important papers. ° °Meals: °Start with liquid foods such as gelatin or soup. Progress to regular foods as tolerated. Avoid greasy, spicy, heavy foods. If nausea and/or vomiting occur, drink only clear liquids until the nausea and/or vomiting subsides. Call your physician if vomiting continues. ° °Special Instructions/Symptoms: °Your throat may feel dry or sore from the anesthesia or the breathing tube placed in your throat during surgery. If this causes discomfort, gargle with warm salt water. The discomfort should disappear within 24 hours. ° °

## 2014-07-10 NOTE — Interval H&P Note (Signed)
History and Physical Interval Note:  07/10/2014 1:40 PM  [name]  has presented today for surgery, with the diagnosis of LEFT CARPAL TUNNEL SYNDROME  The various methods of treatment have been discussed with the patient and family. After consideration of risks, benefits and other options for treatment, the patient has consented to  Procedure(s): LEFT ENDOSCOPIC CARPAL TUNNEL RELEASE  (Left) as a surgical intervention .  The patient's history has been reviewed, patient examined, no change in status, stable for surgery.  I have reviewed the patient's chart and labs.  Questions were answered to the patient's satisfaction.     Fateh Kindle A.

## 2014-07-13 ENCOUNTER — Encounter (HOSPITAL_BASED_OUTPATIENT_CLINIC_OR_DEPARTMENT_OTHER): Payer: Self-pay | Admitting: Orthopedic Surgery

## 2015-01-08 ENCOUNTER — Ambulatory Visit: Payer: Commercial Managed Care - HMO | Admitting: Podiatrist

## 2015-02-03 ENCOUNTER — Ambulatory Visit (INDEPENDENT_AMBULATORY_CARE_PROVIDER_SITE_OTHER): Payer: Commercial Managed Care - HMO

## 2015-02-03 ENCOUNTER — Ambulatory Visit (INDEPENDENT_AMBULATORY_CARE_PROVIDER_SITE_OTHER): Payer: Commercial Managed Care - HMO | Admitting: Podiatrist

## 2015-02-03 ENCOUNTER — Encounter: Payer: Self-pay | Admitting: Podiatrist

## 2015-02-03 VITALS — BP 161/66 | HR 114 | Resp 16

## 2015-02-03 DIAGNOSIS — M79674 Pain in right toe(s): Secondary | ICD-10-CM | POA: Diagnosis not present

## 2015-02-03 DIAGNOSIS — L89891 Pressure ulcer of other site, stage 1: Secondary | ICD-10-CM | POA: Diagnosis not present

## 2015-02-03 DIAGNOSIS — L97521 Non-pressure chronic ulcer of other part of left foot limited to breakdown of skin: Secondary | ICD-10-CM

## 2015-02-03 MED ORDER — CEPHALEXIN 500 MG PO CAPS: 500.0000 mg | ORAL_CAPSULE | Freq: Three times a day (TID) | ORAL | Status: DC

## 2015-02-03 MED ORDER — MUPIROCIN 2 % EX OINT: 1.0000 "application " | TOPICAL_OINTMENT | Freq: Two times a day (BID) | CUTANEOUS | Status: DC

## 2015-02-03 NOTE — Patient Instructions (Signed)
Soak in epsom salt soaks 2 x per day for 3 days then 1 time a day for 3-4 more days.  Dry off the toe and apply the bactroban ointment.    Soak Instructions    THE DAY AFTER THE PROCEDURE  Place 1/4 cup of epsom salts in a quart of warm tap water.  Submerge your foot in the solution for 20 minutes.    Next, remove your foot or feet from solution, blot dry the affected area and cover.  You may use a band aid large enough to cover the area or use gauze and tape.  Apply other medications to the area as directed by the doctor    IF YOUR SKIN BECOMES IRRITATED WHILE USING THESE INSTRUCTIONS, IT IS OKAY TO SWITCH TO  WHITE VINEGAR AND WATER. Or you may use antibacterial soap and water to keep the toe clean

## 2015-02-10 NOTE — Progress Notes (Signed)
   Subjective:    Patient ID: [name], female    DOB: 12/04/1951, 64 y.o.   MRN: [id number]  Chief Complaint  Patient presents with  . Toe Pain    Follow up 2nd toe right   "Every winter my toe just hurts. I had another doctor give me some shots in it"         Objective:   Physical Exam Vascular status reveals palpable pedal pulses DP and PT at 1/4 bilateral. Capillary refill time is increased to the distal digits. Cool to the touch of the distal digits is also noted. Neurological sensation is slightly decreased to the distal second digit bilaterally. Musculoskeletal examination reveals the muscle strength and stability bilateral feet. Dermatological examination reveals a very superficial ulceration of the distal aspect of the right second toe. No redness, swelling or sign of infection present.      Assessment & Plan:  Ulcer right 2nd toe,  Chilblains  Plan: debridement of ulceration accomplished.  iodosorb applied with a dressing applied.  rx for bactroban and keflex written.  Will recheck the toe next week and she will call if the toe fails to improve.

## 2015-02-12 ENCOUNTER — Ambulatory Visit: Payer: Commercial Managed Care - HMO | Admitting: Podiatrist

## 2015-02-19 ENCOUNTER — Encounter: Payer: Self-pay | Admitting: Podiatrist

## 2015-02-19 ENCOUNTER — Ambulatory Visit (INDEPENDENT_AMBULATORY_CARE_PROVIDER_SITE_OTHER): Payer: Commercial Managed Care - HMO | Admitting: Podiatrist

## 2015-02-19 VITALS — BP 115/69 | HR 110 | Resp 16

## 2015-02-19 DIAGNOSIS — L89891 Pressure ulcer of other site, stage 1: Secondary | ICD-10-CM

## 2015-02-19 DIAGNOSIS — L97511 Non-pressure chronic ulcer of other part of right foot limited to breakdown of skin: Secondary | ICD-10-CM

## 2015-02-19 NOTE — Progress Notes (Signed)
   Subjective:    Patient ID: [name], female    DOB: 09-Feb-1951, 64 y.o.   MRN: [id number]  Chief Complaint  Patient presents with  . Foot Ulcer    Follow up 2nd toe right   "Doing much better"         Objective:   Physical Exam Vascular status reveals palpable pedal pulses DP and PT at 1/4 bilateral. Capillary refill time is increased to the distal digits. Cool to the touch of the distal digits is also noted. Neurological sensation is slightly decreased to the distal second digit bilaterally. Musculoskeletal examination reveals the muscle strength and stability bilateral feet. Dermatological examination reveals a  healing ulceration of the distal aspect of the right second toe. No redness, swelling or sign of infection present. Hyperkeratotic tissue is surrounding the ulceration with no sign of infection present.      Assessment & Plan:  Ulcer right 2nd toe,  Chilblains  Plan: debridement of ulceration accomplished.  iodosorb applied with a dressing applied.  Recommended contiuation of wound dressings until the toe heals.  Should resolve on its own.

## 2015-08-06 ENCOUNTER — Other Ambulatory Visit: Payer: Self-pay | Admitting: Orthopedic Surgery

## 2015-08-19 ENCOUNTER — Ambulatory Visit (INDEPENDENT_AMBULATORY_CARE_PROVIDER_SITE_OTHER): Payer: Commercial Managed Care - HMO | Admitting: Family Medicine

## 2015-08-19 ENCOUNTER — Encounter: Payer: Self-pay | Admitting: Family Medicine

## 2015-08-19 VITALS — BP 140/84 | HR 64 | Ht 62.0 in | Wt 311.2 lb

## 2015-08-19 DIAGNOSIS — M179 Osteoarthritis of knee, unspecified: Secondary | ICD-10-CM | POA: Diagnosis not present

## 2015-08-19 DIAGNOSIS — Z7689 Persons encountering health services in other specified circumstances: Secondary | ICD-10-CM

## 2015-08-19 DIAGNOSIS — I10 Essential (primary) hypertension: Secondary | ICD-10-CM | POA: Diagnosis not present

## 2015-08-19 DIAGNOSIS — Z8669 Personal history of other diseases of the nervous system and sense organs: Secondary | ICD-10-CM | POA: Diagnosis not present

## 2015-08-19 DIAGNOSIS — R69 Illness, unspecified: Secondary | ICD-10-CM

## 2015-08-19 DIAGNOSIS — Z87898 Personal history of other specified conditions: Secondary | ICD-10-CM

## 2015-08-19 DIAGNOSIS — Z7189 Other specified counseling: Secondary | ICD-10-CM

## 2015-08-19 DIAGNOSIS — E1165 Type 2 diabetes mellitus with hyperglycemia: Secondary | ICD-10-CM | POA: Diagnosis not present

## 2015-08-19 DIAGNOSIS — M1712 Unilateral primary osteoarthritis, left knee: Secondary | ICD-10-CM

## 2015-08-19 NOTE — Progress Notes (Signed)
Subjective:    Patient ID: [name], female    DOB: July 26, 1951, 64 y.o.   MRN: [id number]  HPI She is new to this practice and here to establish care. She has several chronic conditions that she is taking medication for. Reports being diagnosed with diabetes type 2 in 2000 and is currently taking medication however states she is not taking it as prescribed. She states she is taking 1 or 2 Metformin daily and decides how to take her medication based on how she is feeling that day. She is taking Glimepride daily. Reports having blood sugars that are high in morning and lower in afternoon. Denies having blood sugars below 80. She complains of some loss of sensation to bilateral feet for a while. Has seen podiatrist recently. Has been to eye doctor in past 2 years.  She states she eats a lot of sweets and is aware that she needs to eat better and lose weight. Reports osteoarthritis to her knees and states her left knee hurts her more. She has been taking Tramadol for knee pain and would like for this to be filled. She also takes Aleve as needed for pain. She states Tramadol is the only medication that helps knee pain.  Reports history of vertigo and has sporadic episodes of this but states that if she stays hydrated it doesn't happen as often. She takes no medications for this.  She had recent carpel tunnel surgery. Stitches still in place. She lives alone but grandson staying with her now. She has 2 children. 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   Reviewed allergies, medications, past medical history, surgical, and family history.  No immunizations on file. Discussed health maintenance, colonoscopy in past 2 years.    Review of Systems Review of Systems Constitutional: -fever, -chills, -sweats, -unexpected weight change,-fatigue Cardiology:  -chest pain, -palpitations, -edema Respiratory: -cough, -shortness of breath, -wheezing Gastroenterology:  -abdominal pain, -nausea, -vomiting, -diarrhea, -constipation  Hematology: -bleeding or bruising problems Musculoskeletal: +arthralgias, -myalgias, -joint swelling, -back pain Neurology: -headache, -weakness, -tingling, -numbness       Objective:   Physical Exam  Alert and oriented x3.   no distress. Cardiac exam shows a regular sinus rhythm without murmurs or gallops. Lungs are clear to auscultation. Otherwise not examined        Assessment & Plan:  Type 2 diabetes mellitus with hyperglycemia - Plan: Amb ref to Medical Nutrition Therapy-MNT  Encounter to establish care  Essential hypertension  OBESITY, MORBID - Plan: Amb ref to Medical Nutrition Therapy-MNT  Taking multiple medications for chronic disease  History of vertigo  Osteoarthritis of left knee, unspecified osteoarthritis type  Discussed her chronic health conditions and past treatments. She will get her records from her previous provider sent including her immunizations. She has an appointment to return to her orthopedist for suture removal from her carpal tunnel surgery. Recommend referral to medical nutrition therapist for her diabetes and weight loss. She agrees to go. Discussed that she will keep a diabetes journal that includes her blood sugars, once daily before breakfast and 1 later in the day 2 hours after a meal. She will also include how and when she is taking her medication. Instructed her to bring her monitor to her next visit. Discussed the fact that I will not manage chronic pain and that we will discuss her tramadol use at her next appointment once I received some documentation from the provider who started her  on this medication for osteoarthritis. In the meantime she will ice, elevate, take Aleve as she has been. She will schedule a return visit for a physical exam.

## 2015-08-19 NOTE — Patient Instructions (Signed)
Your A1C, which is your blood sugar average over the past 3 months is 7.0 today. We would like for it to be <6.5 if possible.   Check your blood sugars once in the morning before breakfast and later in the day at least 2 hours after a meal. Keep track of your numbers. Also I need to know how your taking her medication, we will see what your records show an your labs and bring you back for medication adjustment.  Schedule an eye appointment.  Check your feet daily, wear shoes at all times to protect your feet.   Get your records from previous providers including your immunization and labs sent to this office.      Basic Carbohydrate Counting for Diabetes Mellitus Carbohydrate counting is a method for keeping track of the amount of carbohydrates you eat. Eating carbohydrates naturally increases the level of sugar (glucose) in your blood, so it is important for you to know the amount that is okay for you to have in every meal. Carbohydrate counting helps keep the level of glucose in your blood within normal limits. The amount of carbohydrates allowed is different for every person. A dietitian can help you calculate the amount that is right for you. Once you know the amount of carbohydrates you can have, you can count the carbohydrates in the foods you want to eat. Carbohydrates are found in the following foods:  Grains, such as breads and cereals.  Dried beans and soy products.  Starchy vegetables, such as potatoes, peas, and corn.  Fruit and fruit juices.  Milk and yogurt.  Sweets and snack foods, such as cake, cookies, candy, chips, soft drinks, and fruit drinks. CARBOHYDRATE COUNTING There are two ways to count the carbohydrates in your food. You can use either of the methods or a combination of both. Reading the "Nutrition Facts" on [place] The "Nutrition Facts" is an area that is included on the labels of almost all packaged food and beverages in the [place]. It includes the  serving size of that food or beverage and information about the nutrients in each serving of the food, including the grams (g) of carbohydrate per serving.  Decide the number of servings of this food or beverage that you will be able to eat or drink. Multiply that number of servings by the number of grams of carbohydrate that is listed on the label for that serving. The total will be the amount of carbohydrates you will be having when you eat or drink this food or beverage. Learning Standard Serving Sizes of Food When you eat food that is not packaged or does not include "Nutrition Facts" on the label, you need to measure the servings in order to count the amount of carbohydrates.A serving of most carbohydrate-rich foods contains about 15 g of carbohydrates. The following list includes serving sizes of carbohydrate-rich foods that provide 15 g ofcarbohydrate per serving:   1 slice of bread (1 oz) or 1 six-inch tortilla.    of a hamburger bun or English muffin.  4-6 crackers.   cup unsweetened dry cereal.    cup hot cereal.   cup rice or pasta.    cup mashed potatoes or  of a large baked potato.  1 cup fresh fruit or one small piece of fruit.    cup canned or frozen fruit or fruit juice.  1 cup milk.   cup plain fat-free yogurt or yogurt sweetened with artificial sweeteners.   cup cooked dried beans  or starchy vegetable, such as peas, corn, or potatoes.  Decide the number of standard-size servings that you will eat. Multiply that number of servings by 15 (the grams of carbohydrates in that serving). For example, if you eat 2 cups of strawberries, you will have eaten 2 servings and 30 g of carbohydrates (2 servings x 15 g = 30 g). For foods such as soups and casseroles, in which more than one food is mixed in, you will need to count the carbohydrates in each food that is included. EXAMPLE OF CARBOHYDRATE COUNTING Sample Dinner  3 oz chicken breast.   cup of brown  rice.   cup of corn.  1 cup milk.   1 cup strawberries with sugar-free whipped topping.  Carbohydrate Calculation Step 1: Identify the foods that contain carbohydrates:   Rice.   Corn.   Milk.   Strawberries. Step 2:Calculate the number of servings eaten of each:   2 servings of rice.   1 serving of corn.   1 serving of milk.   1 serving of strawberries. Step 3: Multiply each of those number of servings by 15 g:   2 servings of rice x 15 g = 30 g.   1 serving of corn x 15 g = 15 g.   1 serving of milk x 15 g = 15 g.   1 serving of strawberries x 15 g = 15 g. Step 4: Add together all of the amounts to find the total grams of carbohydrates eaten: 30 g + 15 g + 15 g + 15 g = 75 g. Document Released: 12/04/2005 Document Revised: 04/20/2014 Document Reviewed: 10/31/2013 [facility] Patient Information 2015 [place], [place]. This information is not intended to replace advice given to you by your health care provider. Make sure you discuss any questions you have with your health care provider.

## 2015-08-27 ENCOUNTER — Encounter: Payer: Self-pay | Admitting: Family Medicine

## 2015-08-27 ENCOUNTER — Ambulatory Visit (INDEPENDENT_AMBULATORY_CARE_PROVIDER_SITE_OTHER): Payer: Commercial Managed Care - HMO | Admitting: Family Medicine

## 2015-08-27 VITALS — BP 112/80 | HR 64 | Ht 66.5 in | Wt 311.0 lb

## 2015-08-27 DIAGNOSIS — M1710 Unilateral primary osteoarthritis, unspecified knee: Secondary | ICD-10-CM

## 2015-08-27 DIAGNOSIS — E088 Diabetes mellitus due to underlying condition with unspecified complications: Secondary | ICD-10-CM | POA: Diagnosis not present

## 2015-08-27 DIAGNOSIS — Z0000 Encounter for general adult medical examination without abnormal findings: Secondary | ICD-10-CM | POA: Diagnosis not present

## 2015-08-27 DIAGNOSIS — Z124 Encounter for screening for malignant neoplasm of cervix: Secondary | ICD-10-CM | POA: Diagnosis not present

## 2015-08-27 DIAGNOSIS — I10 Essential (primary) hypertension: Secondary | ICD-10-CM | POA: Diagnosis not present

## 2015-08-27 DIAGNOSIS — M179 Osteoarthritis of knee, unspecified: Secondary | ICD-10-CM | POA: Diagnosis not present

## 2015-08-27 DIAGNOSIS — K219 Gastro-esophageal reflux disease without esophagitis: Secondary | ICD-10-CM

## 2015-08-27 DIAGNOSIS — IMO0002 Reserved for concepts with insufficient information to code with codable children: Secondary | ICD-10-CM

## 2015-08-27 LAB — COMPREHENSIVE METABOLIC PANEL
ALT: 10 U/L (ref 6–29)
AST: 12 U/L (ref 10–35)
Albumin: 3.9 g/dL (ref 3.6–5.1)
Alkaline Phosphatase: 75 U/L (ref 33–130)
BUN: 22 mg/dL (ref 7–25)
CO2: 26 mmol/L (ref 20–31)
Calcium: 9.6 mg/dL (ref 8.6–10.4)
Chloride: 103 mmol/L (ref 98–110)
Creat: 1.05 mg/dL — ABNORMAL HIGH (ref 0.50–0.99)
Glucose, Bld: 129 mg/dL — ABNORMAL HIGH (ref 65–99)
Potassium: 4.7 mmol/L (ref 3.5–5.3)
Sodium: 143 mmol/L (ref 135–146)
Total Bilirubin: 0.3 mg/dL (ref 0.2–1.2)
Total Protein: 7.3 g/dL (ref 6.1–8.1)

## 2015-08-27 LAB — CBC WITH DIFFERENTIAL/PLATELET
Basophils Absolute: 0 10*3/uL (ref 0.0–0.1)
Basophils Relative: 0 % (ref 0–1)
Eosinophils Absolute: 0.1 10*3/uL (ref 0.0–0.7)
Eosinophils Relative: 2 % (ref 0–5)
HCT: 36.8 % (ref 36.0–46.0)
Hemoglobin: 11.7 g/dL — ABNORMAL LOW (ref 12.0–15.0)
Lymphocytes Relative: 19 % (ref 12–46)
Lymphs Abs: 1.3 10*3/uL (ref 0.7–4.0)
MCH: 29.1 pg (ref 26.0–34.0)
MCHC: 31.8 g/dL (ref 30.0–36.0)
MCV: 91.5 fL (ref 78.0–100.0)
MPV: 10.4 fL (ref 8.6–12.4)
Monocytes Absolute: 0.4 10*3/uL (ref 0.1–1.0)
Monocytes Relative: 5 % (ref 3–12)
Neutro Abs: 5.3 10*3/uL (ref 1.7–7.7)
Neutrophils Relative %: 74 % (ref 43–77)
Platelets: 338 10*3/uL (ref 150–400)
RBC: 4.02 MIL/uL (ref 3.87–5.11)
RDW: 14.3 % (ref 11.5–15.5)
WBC: 7.1 10*3/uL (ref 4.0–10.5)

## 2015-08-27 LAB — POCT URINALYSIS DIPSTICK
Bilirubin, UA: NEGATIVE
Blood, UA: NEGATIVE
Glucose, UA: NEGATIVE
Ketones, UA: NEGATIVE
Leukocytes, UA: NEGATIVE
Nitrite, UA: NEGATIVE
Protein, UA: NEGATIVE
Spec Grav, UA: 1.025
Urobilinogen, UA: NEGATIVE
pH, UA: 6

## 2015-08-27 LAB — LIPID PANEL
Cholesterol: 198 mg/dL (ref 125–200)
HDL: 35 mg/dL — ABNORMAL LOW (ref >=46)
LDL Cholesterol: 113 mg/dL (ref <130)
Total CHOL/HDL Ratio: 5.7 Ratio — ABNORMAL HIGH (ref <=5.0)
Triglycerides: 252 mg/dL — ABNORMAL HIGH (ref <150)
VLDL: 50 mg/dL — ABNORMAL HIGH (ref <30)

## 2015-08-27 MED ORDER — RANITIDINE HCL 150 MG PO TABS: 150.0000 mg | ORAL_TABLET | Freq: Two times a day (BID) | ORAL | Status: DC

## 2015-08-27 NOTE — Progress Notes (Signed)
Subjective:    Patient ID: [name], female    DOB: Jun 05, 1951, 64 y.o.   MRN: [id number]  HPI She is here for a complete physical exam. She states she has been trying to eat fewer sweets but has still been eating fast food and sweets occasionally. She did check her blood sugars and her morning fasting blood sugars ranged between 123 and 198. She states that when her blood sugars are high it's usually because she had a lot of sweets the night before. She states she know that when she eats certain foods that it drives up her blood sugar. States she has an appointment with nutritionist in October.  She states her knees have been hurting when it rains and she takes over the counter pain medicine for this.  Also complains of feeling acid bubbling up in her throat sometimes at night when she goes to bed.  Reports that she has not had a pap smear in years.  She denies fever, chills, chest pain, palpitations, DOE, GI or GU issues.  Reviewed allergies, meds, social history.    Review of Systems Review of Systems Constitutional: -fever, -chills, -sweats, -unexpected weight change,-fatigue ENT: -runny nose, -ear pain, -sore throat Cardiology:  -chest pain, -palpitations, -edema Respiratory: -cough, -shortness of breath, -wheezing Gastroenterology: -abdominal pain, -nausea, -vomiting, -diarrhea, -constipation  Hematology: -bleeding or bruising problems Musculoskeletal: +arthralgias: knees, -myalgias, -joint swelling, -back pain Ophthalmology: -vision changes Urology: -dysuria, -difficulty urinating, -hematuria, -urinary frequency, -urgency Neurology: -headache, -weakness, -tingling, -numbness       Objective:   Physical Exam BP 112/80 mmHg  Pulse 64  Ht 5' 6.5" (1.689 m)  Wt 311 lb (141.069 kg)  BMI 49.45 kg/m2  General Appearance:    Alert, cooperative, no distress, appears stated age  Head:    Normocephalic, without obvious abnormality, atraumatic  Eyes:    PERRL,  conjunctiva/corneas clear, EOM's intact, fundi    benign  Ears:    Normal TM's and external ear canals  Nose:   Nares normal, mucosa normal, no drainage or sinus   tenderness  Throat:   Lips, mucosa, and tongue normal; teeth and gums normal  Neck:   Supple, no lymphadenopathy;  thyroid:  no   enlargement/tenderness/nodules; no carotid   bruit   Back:    Spine nontender, no curvature, ROM normal, no CVA     tenderness  Lungs:     Clear to auscultation bilaterally without wheezes, rales or     ronchi; respirations unlabored  Chest Wall:    No tenderness or deformity   Heart:    Regular rate and rhythm, S1 and S2 normal, no murmur, rub   or gallop  Breast Exam:    No tenderness, masses, or nipple discharge or inversion.      No axillary lymphadenopathy  Abdomen:     Soft,protuberant, non-tender, nondistended, normoactive bowel sounds,    no masses, no hepatosplenomegaly  Genitalia:    Normal external genitalia without lesions. vagina normal; Unable to visualize cervix. No abnormal vaginal discharge.  Pap not performed, speculum unable to reach cervix.  Rectal:    Refused, recent colonoscopy  Extremities:   No clubbing, cyanosis or edema  Pulses:   2+ and symmetric all extremities  Skin:   Skin color, texture, turgor normal, no rashes or lesions  Lymph nodes:   Cervical, supraclavicular, and axillary nodes normal  Neurologic:   CNII-XII intact, normal strength, sensation and gait; reflexes 2+ and symmetric throughout  Psych:   Normal mood, affect, hygiene and grooming.   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 She states she has not had a Pap smear in a very long time. I will refer her for cervical cancer screening. Education provided on reflux and she will try a acid blocker and see if this helps. She will continue taking her diabetes medication and checking her blood sugars at least 3 times per week. She has appointment to see the nutritionist in October and I encouraged her to keep this appointment. For her osteoarthritis to her knees, I recommended that she can take NSAIDs when the pain is moderate to severe and she states she will get me the documentation from where she was diagnosed with osteoarthritis and started on chronic tramadol. Her blood pressure appears stable on medication.

## 2015-08-27 NOTE — Patient Instructions (Addendum)
Remember to get your records faxed to me. Find out who diagnosed him with osteoarthritis and who put you on Tramadol for chronic knee pain.  Get your flu shot. Check and see if your insurance will cover the Tdap shot. Also check and see if her insurance covers the shingles vaccination.  I want you to keep your appointment with your nutritionist in October.  Check your blood sugars once before breakfast and one time 2 hours after a meal and another time at bedtime, so just check it 3 times a week now.   Call and make your eye appointment and call make her mammogram appointment.  For your reflux problems. Don't eat 3 hours before laying down and eat small frequent meals instead of large one.  I'm prescribing an antacid today for you to take in the evening. Let me know if you can tell a difference if this helps with your reflux.    Preventative Care for Adults - Female      MAINTAIN REGULAR HEALTH EXAMS:  A routine yearly physical is a good way to check in with your primary care provider about your health and preventive screening. It is also an opportunity to share updates about your health and any concerns you have, and receive a thorough all-over exam.   Most health insurance companies pay for at least some preventative services.  Check with your health plan for specific coverages.  WHAT PREVENTATIVE SERVICES DO WOMEN NEED?  Adult women should have their weight and blood pressure checked regularly.   Women age 74 and older should have their cholesterol levels checked regularly.  Women should be screened for cervical cancer with a Pap smear and pelvic exam beginning at either age 27, or 3 years after they become sexually activity.    Breast cancer screening generally begins at age 55 with a mammogram and breast exam by your primary care provider.    Beginning at age 46 and continuing to age 58, women should be screened for colorectal cancer.  Certain people may need continued testing  until age 56.  Updating vaccinations is part of preventative care.  Vaccinations help protect against diseases such as the flu.  Osteoporosis is a disease in which the bones lose minerals and strength as we age. Women ages 30 and over should discuss this with their caregivers, as should women after menopause who have other risk factors.  Lab tests are generally done as part of preventative care to screen for anemia and blood disorders, to screen for problems with the kidneys and liver, to screen for bladder problems, to check blood sugar, and to check your cholesterol level.  Preventative services generally include counseling about diet, exercise, avoiding tobacco, drugs, excessive alcohol consumption, and sexually transmitted infections.    GENERAL RECOMMENDATIONS FOR GOOD HEALTH:  Healthy diet:  Eat a variety of foods, including fruit, vegetables, animal or vegetable protein, such as meat, fish, chicken, and eggs, or beans, lentils, tofu, and grains, such as rice.  Drink plenty of water daily.  Decrease saturated fat in the diet, avoid lots of red meat, processed foods, sweets, fast foods, and fried foods.  Exercise:  Aerobic exercise helps maintain good heart health. At least 30-40 minutes of moderate-intensity exercise is recommended. For example, a brisk walk that increases your heart rate and breathing. This should be done on most days of the week.   Find a type of exercise or a variety of exercises that you enjoy so that it becomes a part  of your daily life.  Examples are running, walking, swimming, water aerobics, and biking.  For motivation and support, explore group exercise such as aerobic class, spin class, Zumba, Yoga,or  martial arts, etc.    Set exercise goals for yourself, such as a certain weight goal, walk or run in a race such as a 5k walk/run.  Speak to your primary care provider about exercise goals.  Disease prevention:  If you smoke or chew tobacco, find out from  your caregiver how to quit. It can literally save your life, no matter how long you have been a tobacco user. If you do not use tobacco, never begin.   Maintain a healthy diet and normal weight. Increased weight leads to problems with blood pressure and diabetes.   The Body Mass Index or BMI is a way of measuring how much of your body is fat. Having a BMI above 27 increases the risk of heart disease, diabetes, hypertension, stroke and other problems related to obesity. Your caregiver can help determine your BMI and based on it develop an exercise and dietary program to help you achieve or maintain this important measurement at a healthful level.  High blood pressure causes heart and blood vessel problems.  Persistent high blood pressure should be treated with medicine if weight loss and exercise do not work.   Fat and cholesterol leaves deposits in your arteries that can block them. This causes heart disease and vessel disease elsewhere in your body.  If your cholesterol is found to be high, or if you have heart disease or certain other medical conditions, then you may need to have your cholesterol monitored frequently and be treated with medication.   Ask if you should have a cardiac stress test if your history suggests this. A stress test is a test done on a treadmill that looks for heart disease. This test can find disease prior to there being a problem.  Menopause can be associated with physical symptoms and risks. Hormone replacement therapy is available to decrease these. You should talk to your caregiver about whether starting or continuing to take hormones is right for you.   Osteoporosis is a disease in which the bones lose minerals and strength as we age. This can result in serious bone fractures. Risk of osteoporosis can be identified using a bone density scan. Women ages 60 and over should discuss this with their caregivers, as should women after menopause who have other risk factors. Ask  your caregiver whether you should be taking a calcium supplement and Vitamin D, to reduce the rate of osteoporosis.   Avoid drinking alcohol in excess (more than two drinks per day).  Avoid use of street drugs. Do not share needles with anyone. Ask for professional help if you need assistance or instructions on stopping the use of alcohol, cigarettes, and/or drugs.  Brush your teeth twice a day with fluoride toothpaste, and floss once a day. Good oral hygiene prevents tooth decay and gum disease. The problems can be painful, unattractive, and can cause other health problems. Visit your dentist for a routine oral and dental check up and preventive care every 6-12 months.   Look at your skin regularly.  Use a mirror to look at your back. Notify your caregivers of changes in moles, especially if there are changes in shapes, colors, a size larger than a pencil eraser, an irregular border, or development of new moles.  Safety:  Use seatbelts 100% of the time, whether driving or as  a passenger.  Use safety devices such as hearing protection if you work in environments with loud noise or significant background noise.  Use safety glasses when doing any work that could send debris in to the eyes.  Use a helmet if you ride a bike or motorcycle.  Use appropriate safety gear for contact sports.  Talk to your caregiver about gun safety.  Use sunscreen with a SPF (or skin protection factor) of 15 or greater.  Lighter skinned people are at a greater risk of skin cancer. Don't forget to also wear sunglasses in order to protect your eyes from too much damaging sunlight. Damaging sunlight can accelerate cataract formation.   Practice safe sex. Use condoms. Condoms are used for birth control and to help reduce the spread of sexually transmitted infections (or STIs).  Some of the STIs are gonorrhea (the clap), chlamydia, syphilis, trichomonas, herpes, HPV (human papilloma virus) and HIV (human immunodeficiency virus)  which causes AIDS. The herpes, HIV and HPV are viral illnesses that have no cure. These can result in disability, cancer and death.   Keep carbon monoxide and smoke detectors in your home functioning at all times. Change the batteries every 6 months or use a model that plugs into the wall.   Vaccinations:  Stay up to date with your tetanus shots and other required immunizations. You should have a booster for tetanus every 10 years. Be sure to get your flu shot every year, since 5%-20% of the [place] population comes down with the flu. The flu vaccine changes each year, so being vaccinated once is not enough. Get your shot in the fall, before the flu season peaks.   Other vaccines to consider:  Human Papilloma Virus or HPV causes cancer of the cervix, and other infections that can be transmitted from person to person. There is a vaccine for HPV, and females should get immunized between the ages of 6 and 66. It requires a series of 3 shots.   Pneumococcal vaccine to protect against certain types of pneumonia.  This is normally recommended for adults age 41 or older.  However, adults younger than 62 years old with certain underlying conditions such as diabetes, heart or lung disease should also receive the vaccine.  Shingles vaccine to protect against Varicella Zoster if you are older than age 11, or younger than 62 years old with certain underlying illness.  Hepatitis A vaccine to protect against a form of infection of the liver by a virus acquired from food.  Hepatitis B vaccine to protect against a form of infection of the liver by a virus acquired from blood or body fluids, particularly if you work in health care.  If you plan to travel internationally, check with your local health department for specific vaccination recommendations.  Cancer Screening:  Breast cancer screening is essential to preventive care for women. All women age 33 and older should perform a breast self-exam every month. At  age 58 and older, women should have their caregiver complete a breast exam each year. Women at ages 56 and older should have a mammogram (x-ray film) of the breasts. Your caregiver can discuss how often you need mammograms.    Cervical cancer screening includes taking a Pap smear (sample of cells examined under a microscope) from the cervix (end of the uterus). It also includes testing for HPV (Human Papilloma Virus, which can cause cervical cancer). Screening and a pelvic exam should begin at age 74, or 3 years after a woman becomes  sexually active. Screening should occur every year, with a Pap smear but no HPV testing, up to age 38. After age 81, you should have a Pap smear every 3 years with HPV testing, if no HPV was found previously.   Most routine colon cancer screening begins at the age of 88. On a yearly basis, doctors may provide special easy to use take-home tests to check for hidden blood in the stool. Sigmoidoscopy or colonoscopy can detect the earliest forms of colon cancer and is life saving. These tests use a small camera at the end of a tube to directly examine the colon. Speak to your caregiver about this at age 62, when routine screening begins (and is repeated every 5 years unless early forms of pre-cancerous polyps or small growths are found).

## 2015-08-30 ENCOUNTER — Other Ambulatory Visit: Payer: Self-pay | Admitting: Family Medicine

## 2015-08-30 DIAGNOSIS — E781 Pure hyperglyceridemia: Secondary | ICD-10-CM

## 2015-08-30 HISTORY — DX: Pure hyperglyceridemia: E78.1

## 2015-08-30 LAB — POCT GLYCOSYLATED HEMOGLOBIN (HGB A1C): Hemoglobin A1C: 7

## 2015-08-30 MED ORDER — ATORVASTATIN CALCIUM 10 MG PO TABS: 10.0000 mg | ORAL_TABLET | Freq: Every day | ORAL | Status: DC

## 2015-08-30 NOTE — Addendum Note (Signed)
Addended by: Minette Headland A on: 08/30/2015 08:37 AM   Modules accepted: Orders

## 2015-09-21 ENCOUNTER — Telehealth: Payer: Self-pay | Admitting: Family Medicine

## 2015-09-21 NOTE — Telephone Encounter (Signed)
Pt is coming in on Monday afternoon and get these done

## 2015-09-21 NOTE — Telephone Encounter (Signed)
Please tell her that she can come in for nurse visit and get both of these if she would like.

## 2015-09-21 NOTE — Telephone Encounter (Signed)
Pt wants to let Vickie know that her insurance will cover Tdap and Shingles vaccines

## 2015-09-22 ENCOUNTER — Encounter: Payer: Self-pay | Admitting: *Deleted

## 2015-09-22 ENCOUNTER — Encounter: Payer: Commercial Managed Care - HMO | Attending: Family Medicine | Admitting: *Deleted

## 2015-09-22 VITALS — Ht 66.0 in | Wt 318.7 lb

## 2015-09-22 DIAGNOSIS — E119 Type 2 diabetes mellitus without complications: Secondary | ICD-10-CM | POA: Diagnosis not present

## 2015-09-22 DIAGNOSIS — Z713 Dietary counseling and surveillance: Secondary | ICD-10-CM | POA: Diagnosis not present

## 2015-09-22 NOTE — Patient Instructions (Signed)
Plan:  Aim for 2-3 Carb Choices per meal (30-45 grams) +/- 1 either way  Aim for 0-158 Carbs per snack if hungry  Include protein in moderation with your meals and snacks Consider reading food labels for Total Carbohydrate and Fat Grams of foods Consider  increasing your activity level by walking for 30 minutes daily as tolerated Consider checking BG at alternate times per day to include first in the morning before eating and 2 hours after lunch  continue taking medication as directed by MD  Go to the dollar store and get a small dish for ice cream

## 2015-09-27 ENCOUNTER — Other Ambulatory Visit: Payer: Commercial Managed Care - HMO

## 2015-09-27 ENCOUNTER — Telehealth: Payer: Self-pay | Admitting: Family Medicine

## 2015-09-27 NOTE — Telephone Encounter (Signed)
Left message for pt to call. Per Melissa no medicare plan will pay for Tdap. They may pay for it under pharmacy benefits.

## 2015-09-28 ENCOUNTER — Encounter: Payer: Self-pay | Admitting: *Deleted

## 2015-09-28 NOTE — Progress Notes (Signed)
Diabetes Self-Management Education  Visit Type: First/Initial  Appt. Start Time: 1100 Appt. End Time: 1230  09/28/2015  [name], identified by name and date of birth, is a 64 y.o. female with a diagnosis of Diabetes: Type 2.   ASSESSMENT  Height 5\' 6"  (1.676 m), weight 318 lb 11.2 oz (144.561 kg). Body mass index is 51.46 kg/(m^2).      Diabetes Self-Management Education - 09/22/15 1403    Visit Information   Visit Type First/Initial   Initial Visit   Diabetes Type Type 2   Are you currently following a meal plan? No   Are you taking your medications as prescribed? Yes   Date Diagnosed 2000   Health Coping   How would you rate your overall health? Fair   Psychosocial Assessment   Patient Belief/Attitude about Diabetes Motivated to manage diabetes   Self-care barriers None   Self-management support Doctor's office;CDE visits   Other persons present Patient   Patient Concerns Nutrition/Meal planning;Glycemic Control   Special Needs None   Preferred Learning Style No preference indicated   Learning Readiness Change in progress   How often do you need to have someone help you when you read instructions, pamphlets, or other written materials from your doctor or pharmacy? 1 - Never   Complications   Last HgB A1C per patient/outside source 7 %   How often do you check your blood sugar? 1-2 times/day   Fasting Blood glucose range (mg/dL) 130-179   Postprandial Blood glucose range (mg/dL) 70-129   Have you had a dilated eye exam in the past 12 months? No   Have you had a dental exam in the past 12 months? No   Are you checking your feet? Yes   How many days per week are you checking your feet? 1   Dietary Intake   Breakfast 7:00 (yogurt, banana, muffin raisin cinnamon,) boiled egg/toast, banana sandwich, sausage dog with light syrup, coffee creamer aspertame    Snack (morning) none   Lunch 2:30 - pinto beans, [place] chili beans, cranberry bean, (cook with stock) corn  bread, greens   Snack (evening) apple, nuts, grapes, peanut butter/crackers   Beverage(s) coffee, water, (regular gingerAle, lemonade mix, pineapple juice)   Exercise   Exercise Type Light (walking / raking leaves)   How many days per week to you exercise? 2   How many minutes per day do you exercise? 60   Total minutes per week of exercise 120   Patient Education   Previous Diabetes Education No   Disease state  Definition of diabetes, type 1 and 2, and the diagnosis of diabetes;Factors that contribute to the development of diabetes   Nutrition management  Role of diet in the treatment of diabetes and the relationship between the three main macronutrients and blood glucose level   Physical activity and exercise  Role of exercise on diabetes management, blood pressure control and cardiac health.   Monitoring (p) Daily foot exams;Yearly dilated eye exam;Identified appropriate SMBG and/or A1C goals.;Purpose and frequency of SMBG.  Patient symptomatic at 86mg /dl.   Chronic complications Relationship between chronic complications and blood glucose control;Dental care;Identified and discussed with patient  current chronic complications   Psychosocial adjustment Role of stress on diabetes   Individualized Goals (developed by patient)   Nutrition General guidelines for healthy choices and portions discussed   Physical Activity (p) Exercise 3-5 times per week;30 minutes per day   Medications (p) take my medication as prescribed   Monitoring  (  p) test my blood glucose as discussed   Reducing Risk (p) do foot checks daily   Outcomes   Expected Outcomes (p) Demonstrated interest in learning. Expect positive outcomes   Future DMSE (p) PRN   Program Status (p) Completed      Individualized Plan for Diabetes Self-Management Training:   Learning Objective:  Patient will have a greater understanding of diabetes self-management. Patient education plan is to attend individual and/or group sessions per  assessed needs and concerns.   Plan:   Patient Instructions  Plan:  Aim for 2-3 Carb Choices per meal (30-45 grams) +/- 1 either way  Aim for 0-158 Carbs per snack if hungry  Include protein in moderation with your meals and snacks Consider reading food labels for Total Carbohydrate and Fat Grams of foods Consider  increasing your activity level by walking for 30 minutes daily as tolerated Consider checking BG at alternate times per day to include first in the morning before eating and 2 hours after lunch  continue taking medication as directed by MD  Go to the dollar store and get a small dish for ice cream  Education material provided: Living Well with Diabetes, A1C conversion sheet, Meal plan card, My Plate and Snack sheet  If problems or questions, patient to contact team via:  Phone

## 2015-10-01 LAB — HM DIABETES EYE EXAM

## 2015-10-19 ENCOUNTER — Encounter: Payer: Self-pay | Admitting: Family Medicine

## 2015-11-02 ENCOUNTER — Ambulatory Visit: Payer: Commercial Managed Care - HMO | Admitting: *Deleted

## 2015-11-26 ENCOUNTER — Encounter: Payer: Self-pay | Admitting: Family Medicine

## 2015-11-26 ENCOUNTER — Ambulatory Visit (INDEPENDENT_AMBULATORY_CARE_PROVIDER_SITE_OTHER): Payer: Commercial Managed Care - HMO | Admitting: Family Medicine

## 2015-11-26 VITALS — BP 124/78 | HR 72 | Wt 315.6 lb

## 2015-11-26 DIAGNOSIS — M179 Osteoarthritis of knee, unspecified: Secondary | ICD-10-CM

## 2015-11-26 DIAGNOSIS — E089 Diabetes mellitus due to underlying condition without complications: Secondary | ICD-10-CM

## 2015-11-26 DIAGNOSIS — M1710 Unilateral primary osteoarthritis, unspecified knee: Secondary | ICD-10-CM

## 2015-11-26 DIAGNOSIS — I10 Essential (primary) hypertension: Secondary | ICD-10-CM | POA: Diagnosis not present

## 2015-11-26 DIAGNOSIS — IMO0002 Reserved for concepts with insufficient information to code with codable children: Secondary | ICD-10-CM

## 2015-11-26 LAB — POCT GLYCOSYLATED HEMOGLOBIN (HGB A1C): Hemoglobin A1C: 6.6

## 2015-11-26 MED ORDER — TRAMADOL HCL 50 MG PO TABS: 50.0000 mg | ORAL_TABLET | Freq: Two times a day (BID) | ORAL | Status: DC

## 2015-11-26 NOTE — Progress Notes (Signed)
Subjective:    Patient ID: [name], female    DOB: Mar 26, 1951, 64 y.o.   MRN: [id number]  [name] is a 64 y.o. female who presents for follow-up of Type 2 diabetes mellitus.  She is here for a diabetes follow up.  Patient is checking home blood sugars.   Home blood sugar records: BGs range between 90 and 140 How often is blood sugars being checked: once daily, different times of day, occasionally fasting in mornings or 2 hours after a meal.   Current symptoms include: bilateral knee pain with history of osteoarthritis, sees Dr. Marlou Sa for this states she has taken Tramadol in past and that this medication is the only thing that helps with her knee pain so that she can be more active. She also complains of lower extremity muscle cramping and states this is related to her cholesterol medication. States this happened last time she took statin and when she stopped it the pain went away.  Patient denies hyperglycemia, hypoglycemia , increased appetite, nausea, polydipsia, polyuria, visual disturbances, vomiting and weight loss.  Patient is checking their feet daily. Any Foot concerns (callous, ulcer, wound, thickened nails, toenail fungus, skin fungus, hammer toe): 2nd toe pain on right foot when it gets cold, she has seen podiatrist for this. Last dilated eye exam: last month  Medication compliance: excellent  Current diet: in general, a "healthy" diet   she did go to the nutritionist and states this was beneficial. Current exercise: none Known diabetic complications: none  The following portions of the patient's history were reviewed and updated as appropriate: allergies, current medications, past medical history, past social history and problem list.  ROS as in subjective above.     Objective:    Physical Exam Alert and in no distress otherwise not examined. HbA1c 6.6 today  Blood pressure 124/78, pulse 72, weight 315 lb 9.6 oz (143.155 kg).  Lab Review Diabetic Labs  Latest Ref Rng 11/26/2015 08/30/2015 08/27/2015 07/09/2014 04/03/2012  HbA1c - 6.6% 7.0% - - -  Chol 125 - 200 mg/dL - - 198 - -  HDL >=46 mg/dL - - 35(L) - -  Calc LDL <130 mg/dL - - 113 - -  Triglycerides <150 mg/dL - - 252(H) - -  Creatinine 0.50 - 0.99 mg/dL - - 1.05(H) 1.07 0.70   BP/Weight 11/26/2015 09/22/2015 08/27/2015 [id number]  Systolic BP [id number] - [id number]  Diastolic BP 78 - 80 84 69  Wt. (Lbs) 315.6 318.7 311 311.2 -  BMI 50.96 51.46 49.45 56.9 -   Foot/eye exam completion dates Latest Ref Rng 10/01/2015  Eye Exam No Retinopathy No Retinopathy  Foot Form Completion - -    [name]  reports that she has never smoked. She has never used smokeless tobacco. She reports that she does not drink alcohol or use illicit drugs.     Assessment & Plan:    Diabetes mellitus due to underlying condition without complication, without long-term current use of insulin (HCC) - Plan: POCT glycosylated hemoglobin (Hb A1C), CANCELED: Hemoglobin  Essential hypertension  Morbid obesity, unspecified obesity type ([place])  Osteoarthrosis, unspecified whether generalized or localized, involving lower leg  1. Rx changes: Prescribe tramadol for knee pain so the patient can be more active, stop statin due to muscle cramps in legs. Reevaluate in 1 month.  2. Education: Reviewed 'ABCs' of diabetes management (respective goals in parentheses):  A1C (<7), blood pressure (<130/80), and cholesterol (LDL <100). 3. Compliance at present is estimated  to be good. Efforts to improve compliance (if necessary) will be directed at  Walking 10-15 minutes twice daily 3-4 times per week. 4. Follow up: 1 month Congratulated her on her medication compliance and that her A1c is now within goal. Discussed that I would like for her to be more physically active and that even 10-15 minutes once or twice daily would benefit her. She will stop the statin and follow-up and let me know for muscle cramping has improved. If this ends  up being related to her statin she will have failed to statins. Will need to consider alternative cholesterol medication.

## 2015-11-26 NOTE — Patient Instructions (Signed)
Stop the cholesterol and follow up with me in 1 month to see how you are doing and if the muscle cramps have improved. Also, I prescribe tramadol for your knee pain today. Let me know if this is helping and if you are more active.  Even 10-15 minutes of walking per day will help with your health and weight loss.

## 2015-12-02 ENCOUNTER — Telehealth: Payer: Self-pay | Admitting: Family Medicine

## 2015-12-02 MED ORDER — RANITIDINE HCL 150 MG PO TABS: 150.0000 mg | ORAL_TABLET | Freq: Two times a day (BID) | ORAL | Status: DC

## 2015-12-02 NOTE — Telephone Encounter (Signed)
Pt called requesting a refill on her Zantax pt can be reached at [phone] and pt uses [facility], [place] - [address]

## 2015-12-02 NOTE — Telephone Encounter (Signed)
done

## 2015-12-10 ENCOUNTER — Telehealth: Payer: Self-pay | Admitting: Family Medicine

## 2015-12-10 MED ORDER — METFORMIN HCL 1000 MG PO TABS: 1000.0000 mg | ORAL_TABLET | Freq: Two times a day (BID) | ORAL | Status: DC

## 2015-12-10 NOTE — Telephone Encounter (Signed)
Requesting refill on Metformin 1000mg . Only has one pill left

## 2015-12-10 NOTE — Telephone Encounter (Signed)
Sent in refill

## 2015-12-31 ENCOUNTER — Ambulatory Visit (INDEPENDENT_AMBULATORY_CARE_PROVIDER_SITE_OTHER): Payer: Commercial Managed Care - HMO | Admitting: Family Medicine

## 2015-12-31 VITALS — BP 136/82 | HR 68 | Wt 317.6 lb

## 2015-12-31 DIAGNOSIS — E669 Obesity, unspecified: Secondary | ICD-10-CM | POA: Diagnosis not present

## 2015-12-31 DIAGNOSIS — E1169 Type 2 diabetes mellitus with other specified complication: Secondary | ICD-10-CM | POA: Insufficient documentation

## 2015-12-31 DIAGNOSIS — E781 Pure hyperglyceridemia: Secondary | ICD-10-CM | POA: Diagnosis not present

## 2015-12-31 DIAGNOSIS — E119 Type 2 diabetes mellitus without complications: Secondary | ICD-10-CM | POA: Diagnosis not present

## 2015-12-31 DIAGNOSIS — L97519 Non-pressure chronic ulcer of other part of right foot with unspecified severity: Secondary | ICD-10-CM

## 2015-12-31 HISTORY — DX: Type 2 diabetes mellitus with other specified complication: E11.69

## 2015-12-31 HISTORY — DX: Type 2 diabetes mellitus with other specified complication: E66.9

## 2015-12-31 MED ORDER — COENZYME Q10 30 MG PO CAPS: 30.0000 mg | ORAL_CAPSULE | Freq: Every day | ORAL | Status: DC

## 2015-12-31 NOTE — Progress Notes (Signed)
   Subjective:    Patient ID: [name], female    DOB: 05-18-1951, 65 y.o.   MRN: [id number]  HPI Chief Complaint  Patient presents with  . follow-up    follow-up on cholesterol. no cramps since stopped taking med   She is here to follow up on muscle cramps in her legs with atorvastatin. As recommended, she stopped taking the medication approximately 1 month ago and reports muscle cramps went away within one week of stopping the medication. She denies having leg cramps or muscle aches since. She states she has been doing some research and talking with friends and would like to try taking co-q 10 and start back on statin.   She failed Simvastatin due to myalgias in past 1-2 years.   Blood sugar this morning was 151. She states her morning blood sugars have been ranging in 140s and 150s and her afternoon blood sugars are in the 120s. Denies any lows. No issues with medications.     Complains of 2nd toe pain on right foot with ulceration. States the sore started as a blister and then popped a few days ago.  She has seen provider at [facility] in past for same problem. She reports being told in past that her toe appeared to be "frost bit". States this time every year she seems to have a flare up. She states she has been soaking toe in epsom salt and using a cream that her podiatrist gave her.  Denies fever, chills, nausea. No arthralgias or myalgias.   Review of Systems Pertinent positives and negatives in HPI.    Objective:   Physical Exam BP 136/82 mmHg  Pulse 68  Wt 317 lb 9.6 oz (144.062 kg)  Ulceration to medial tip of 2nd toe on right foot. Foot otherwise normal without skin breakdown.      Assessment & Plan:  Hypertriglyceridemia  Diabetes mellitus type 2 in obese (HCC)  Toe ulcer, right, with unspecified severity ([place])  She would like to try CoQ10 and restart her statin, I am ok with this. Discussed that if she has myalgias again we may need to consider an  alternative to statin for cholesterol control.  She is an established patient of [facility] and has been seeing them annually for same issue with 2nd toe on right foot. Recommend that she follow up with them regarding her toe. In the meantime she will continue soaks in epsom salt and keeping toe clean, dry and covered. Discussed daily foot checks and taking good care of her foot. If she cannot get in to see podiatrist I encouraged her to come back to me to make sure it is continuing to heal.  Discussed that her diabetes puts her at risk for slow healing. Recommend keeping an eye on her blood sugars and following up in 2 months for A1C and diabetes follow up. Her last A1C was 6.6. Will follow up in March for 3 month diabetes check.

## 2015-12-31 NOTE — Patient Instructions (Signed)
Schedule an appointment with [facility] for your toe issue. Let me know if you have any problems after starting back on statin medication. Return in march for diabetes follow up.

## 2016-01-14 ENCOUNTER — Telehealth: Payer: Self-pay | Admitting: Family Medicine

## 2016-01-14 MED ORDER — LOSARTAN POTASSIUM-HCTZ 100-12.5 MG PO TABS: 1.0000 | ORAL_TABLET | Freq: Every day | ORAL | Status: DC

## 2016-01-14 NOTE — Telephone Encounter (Signed)
Pt called for refill of losartan/hctz. We have never filled this for her before. It is listed on her med list. Pt uses [facility] at [address].

## 2016-01-14 NOTE — Telephone Encounter (Signed)
done

## 2016-01-20 ENCOUNTER — Telehealth: Payer: Self-pay | Admitting: Family Medicine

## 2016-01-20 MED ORDER — GLIMEPIRIDE 4 MG PO TABS: 4.0000 mg | ORAL_TABLET | Freq: Every day | ORAL | Status: DC

## 2016-01-20 NOTE — Telephone Encounter (Signed)
Pt needs refill Glimepiride for 90 days to [facility]

## 2016-01-20 NOTE — Telephone Encounter (Signed)
Sent in med to pharmacy 

## 2016-02-09 ENCOUNTER — Telehealth: Payer: Self-pay | Admitting: Family Medicine

## 2016-02-09 MED ORDER — LOSARTAN POTASSIUM-HCTZ 100-12.5 MG PO TABS: 1.0000 | ORAL_TABLET | Freq: Every day | ORAL | Status: DC

## 2016-02-09 MED ORDER — GLIMEPIRIDE 4 MG PO TABS: 4.0000 mg | ORAL_TABLET | Freq: Every day | ORAL | Status: DC

## 2016-02-09 MED ORDER — METFORMIN HCL 1000 MG PO TABS: 1000.0000 mg | ORAL_TABLET | Freq: Two times a day (BID) | ORAL | Status: DC

## 2016-02-09 MED ORDER — ATORVASTATIN CALCIUM 10 MG PO TABS: 10.0000 mg | ORAL_TABLET | Freq: Every day | ORAL | Status: DC

## 2016-02-09 MED ORDER — RANITIDINE HCL 150 MG PO TABS: 150.0000 mg | ORAL_TABLET | Freq: Two times a day (BID) | ORAL | Status: DC

## 2016-02-09 NOTE — Telephone Encounter (Signed)
done

## 2016-02-09 NOTE — Telephone Encounter (Signed)
Rcvd refill request from [place] at [facility] for Atorvastatin, Ranitidine, Glimepiride, Metformin, Losartan HCTZ

## 2016-02-14 ENCOUNTER — Telehealth: Payer: Self-pay | Admitting: Family Medicine

## 2016-02-14 MED ORDER — METFORMIN HCL 1000 MG PO TABS: 1000.0000 mg | ORAL_TABLET | Freq: Two times a day (BID) | ORAL | Status: DC

## 2016-02-14 NOTE — Telephone Encounter (Signed)
Ok to refill 

## 2016-02-14 NOTE — Telephone Encounter (Signed)
Pt called and requested refill on Metformin. Please [facility] to [facility].

## 2016-02-14 NOTE — Telephone Encounter (Signed)
done

## 2016-02-17 ENCOUNTER — Telehealth: Payer: Self-pay | Admitting: Family Medicine

## 2016-02-17 NOTE — Telephone Encounter (Signed)
Pt called confused about her meds refills, she thought they were to be filled at [facility].  Advised sent to [facility] mail order, she called them and should be there any day.  She is out of her meds today & will call us tomorrow if she still doesn't have her refills.  Advised can call local pharmacyfor few pills to hold her over.

## 2016-02-18 NOTE — Telephone Encounter (Signed)
[facility] called and stated that they were going to expedyte her medications. She still will not received until next week. I called in 10 days of Hyzaar and the Lipitor for her to pick up. [facility] informed me that the total of meds would be 33.20. Pt states she can not afford that. Pt was told to not worry about Lipitor but to get as many of the bp meds as she could afford. Pt verbalized understanding.

## 2016-02-18 NOTE — Telephone Encounter (Signed)
Pt called back and stated that her medication had not yet been received. She states that her mail order pharmacy stated that they had been delivered but she didn't have them. Pt was told to call back and inform mail order pharmacy and asked them to over nite her meds to her. Pt stated that she would.   Pt was called back and I left a message. I wanted to know if she had gotten her medications straightened out. I asked the pt to call back and let me know the status.

## 2016-02-21 NOTE — Telephone Encounter (Signed)
Can you please check and see if she got her medication and let me know if I need to do anything different for her. Thanks.

## 2016-02-21 NOTE — Telephone Encounter (Signed)
Pt's ins was called and a emergency override was requested on her bp meds. That was granted and pharmacy was called and told to rerun for a 30 day supply of bp meds only. Pt was then called and a message was left informing her of situation.

## 2016-02-21 NOTE — Telephone Encounter (Signed)
Pt was able to pick up meds

## 2016-02-29 ENCOUNTER — Telehealth: Payer: Self-pay | Admitting: Family Medicine

## 2016-02-29 MED ORDER — TRAMADOL HCL 50 MG PO TABS: 50.0000 mg | ORAL_TABLET | Freq: Two times a day (BID) | ORAL | Status: DC

## 2016-02-29 NOTE — Telephone Encounter (Signed)
Called in med to pharmacy  

## 2016-02-29 NOTE — Telephone Encounter (Signed)
Wants refill on tramadol  Changing pharmacies to [facility]     Mail order

## 2016-02-29 NOTE — Telephone Encounter (Signed)
please take care of this. Okay to refill for 3 months.

## 2016-03-03 ENCOUNTER — Ambulatory Visit: Payer: Commercial Managed Care - HMO | Admitting: Family Medicine

## 2016-03-24 ENCOUNTER — Ambulatory Visit: Payer: Commercial Managed Care - HMO | Admitting: Family Medicine

## 2016-06-21 ENCOUNTER — Telehealth: Payer: Self-pay

## 2016-06-21 MED ORDER — TRAMADOL HCL 50 MG PO TABS: 50.0000 mg | ORAL_TABLET | Freq: Two times a day (BID) | ORAL | Status: DC

## 2016-06-21 NOTE — Telephone Encounter (Signed)
She has not been in since January. Ok to refill tramadol for one month then she needs to be seen.

## 2016-06-21 NOTE — Telephone Encounter (Signed)
Pt request refill of Tramadol to [facility]. [place] December

## 2016-06-21 NOTE — Telephone Encounter (Signed)
Called in med to pharmacy  

## 2016-06-28 ENCOUNTER — Encounter: Payer: Self-pay | Admitting: Family Medicine

## 2016-06-28 ENCOUNTER — Ambulatory Visit (INDEPENDENT_AMBULATORY_CARE_PROVIDER_SITE_OTHER): Payer: Commercial Managed Care - HMO | Admitting: Family Medicine

## 2016-06-28 VITALS — BP 130/70 | HR 64 | Ht 66.5 in | Wt 312.8 lb

## 2016-06-28 DIAGNOSIS — E781 Pure hyperglyceridemia: Secondary | ICD-10-CM

## 2016-06-28 DIAGNOSIS — E119 Type 2 diabetes mellitus without complications: Secondary | ICD-10-CM

## 2016-06-28 DIAGNOSIS — M1710 Unilateral primary osteoarthritis, unspecified knee: Secondary | ICD-10-CM

## 2016-06-28 DIAGNOSIS — M179 Osteoarthritis of knee, unspecified: Secondary | ICD-10-CM

## 2016-06-28 DIAGNOSIS — I10 Essential (primary) hypertension: Secondary | ICD-10-CM

## 2016-06-28 DIAGNOSIS — E669 Obesity, unspecified: Secondary | ICD-10-CM

## 2016-06-28 DIAGNOSIS — E1169 Type 2 diabetes mellitus with other specified complication: Secondary | ICD-10-CM

## 2016-06-28 DIAGNOSIS — IMO0002 Reserved for concepts with insufficient information to code with codable children: Secondary | ICD-10-CM

## 2016-06-28 LAB — POCT GLYCOSYLATED HEMOGLOBIN (HGB A1C): Hemoglobin A1C: 6.7

## 2016-06-28 NOTE — Patient Instructions (Addendum)
Come back within the next week for a fasting lab visit. The orders will be in the computer. Return in early September for a complete physical exam. Continue to walk and exercise and try to do this daily  Basic Carbohydrate Counting for Diabetes Mellitus Carbohydrate counting is a method for keeping track of the amount of carbohydrates you eat. Eating carbohydrates naturally increases the level of sugar (glucose) in your blood, so it is important for you to know the amount that is okay for you to have in every meal. Carbohydrate counting helps keep the level of glucose in your blood within normal limits. The amount of carbohydrates allowed is different for every person. A dietitian can help you calculate the amount that is right for you. Once you know the amount of carbohydrates you can have, you can count the carbohydrates in the foods you want to eat. Carbohydrates are found in the following foods:  Grains, such as breads and cereals.  Dried beans and soy products.  Starchy vegetables, such as potatoes, peas, and corn.  Fruit and fruit juices.  Milk and yogurt.  Sweets and snack foods, such as cake, cookies, candy, chips, soft drinks, and fruit drinks. CARBOHYDRATE COUNTING There are two ways to count the carbohydrates in your food. You can use either of the methods or a combination of both. Reading the "Nutrition Facts" on [place] The "Nutrition Facts" is an area that is included on the labels of almost all packaged food and beverages in the [place]. It includes the serving size of that food or beverage and information about the nutrients in each serving of the food, including the grams (g) of carbohydrate per serving.  Decide the number of servings of this food or beverage that you will be able to eat or drink. Multiply that number of servings by the number of grams of carbohydrate that is listed on the label for that serving. The total will be the amount of carbohydrates you  will be having when you eat or drink this food or beverage. Learning Standard Serving Sizes of Food When you eat food that is not packaged or does not include "Nutrition Facts" on the label, you need to measure the servings in order to count the amount of carbohydrates.A serving of most carbohydrate-rich foods contains about 15 g of carbohydrates. The following list includes serving sizes of carbohydrate-rich foods that provide 15 g ofcarbohydrate per serving:   1 slice of bread (1 oz) or 1 six-inch tortilla.    of a hamburger bun or English muffin.  4-6 crackers.   cup unsweetened dry cereal.    cup hot cereal.   cup rice or pasta.    cup mashed potatoes or  of a large baked potato.  1 cup fresh fruit or one small piece of fruit.    cup canned or frozen fruit or fruit juice.  1 cup milk.   cup plain fat-free yogurt or yogurt sweetened with artificial sweeteners.   cup cooked dried beans or starchy vegetable, such as peas, corn, or potatoes.  Decide the number of standard-size servings that you will eat. Multiply that number of servings by 15 (the grams of carbohydrates in that serving). For example, if you eat 2 cups of strawberries, you will have eaten 2 servings and 30 g of carbohydrates (2 servings x 15 g = 30 g). For foods such as soups and casseroles, in which more than one food is mixed in, you will need to count the  carbohydrates in each food that is included. EXAMPLE OF CARBOHYDRATE COUNTING Sample Dinner  3 oz chicken breast.   cup of brown rice.   cup of corn.  1 cup milk.   1 cup strawberries with sugar-free whipped topping.  Carbohydrate Calculation Step 1: Identify the foods that contain carbohydrates:   Rice.   Corn.   Milk.   Strawberries. Step 2:Calculate the number of servings eaten of each:   2 servings of rice.   1 serving of corn.   1 serving of milk.   1 serving of strawberries. Step 3: Multiply each of those  number of servings by 15 g:   2 servings of rice x 15 g = 30 g.   1 serving of corn x 15 g = 15 g.   1 serving of milk x 15 g = 15 g.   1 serving of strawberries x 15 g = 15 g. Step 4: Add together all of the amounts to find the total grams of carbohydrates eaten: 30 g + 15 g + 15 g + 15 g = 75 g.   This information is not intended to replace advice given to you by your health care provider. Make sure you discuss any questions you have with your health care provider.   Document Released: 12/04/2005 Document Revised: 12/25/2014 Document Reviewed: 10/31/2013 Elsevier Interactive Patient Education 2016 [place] [employer] Calories are energy you get from the things you eat and drink. Your body uses this energy to keep you going throughout the day. The number of calories you eat affects your weight. When you eat more calories than your body needs, your body stores the extra calories as fat. When you eat fewer calories than your body needs, your body burns fat to get the energy it needs. Calorie counting means keeping track of how many calories you eat and drink each day. If you make sure to eat fewer calories than your body needs, you should lose weight. In order for calorie counting to work, you will need to eat the number of calories that are right for you in a day to lose a healthy amount of weight per week. A healthy amount of weight to lose per week is usually 1-2 lb (0.5-0.9 kg). A dietitian can determine how many calories you need in a day and give you suggestions on how to reach your calorie goal.  WHAT IS MY MY PLAN? My goal is to have __________ calories per day.  If I have this many calories per day, I should lose around __________ pounds per week. WHAT DO I NEED TO KNOW ABOUT CALORIE COUNTING? In order to meet your daily calorie goal, you will need to:  Find out how many calories are in each food you would like to eat. Try to do this before you  eat.  Decide how much of the food you can eat.  Write down what you ate and how many calories it had. Doing this is called keeping a food log. WHERE DO I FIND CALORIE INFORMATION? The number of calories in a food can be found on a Nutrition Facts label. Note that all the information on a label is based on a specific serving of the food. If a food does not have a Nutrition Facts label, try to look up the calories online or ask your dietitian for help. HOW DO I DECIDE HOW MUCH TO EAT? To decide how much of the food you can  eat, you will need to consider both the number of calories in one serving and the size of one serving. This information can be found on the Nutrition Facts label. If a food does not have a Nutrition Facts label, look up the information online or ask your dietitian for help. Remember that calories are listed per serving. If you choose to have more than one serving of a food, you will have to multiply the calories per serving by the amount of servings you plan to eat. For example, the label on a package of bread might say that a serving size is 1 slice and that there are 90 calories in a serving. If you eat 1 slice, you will have eaten 90 calories. If you eat 2 slices, you will have eaten 180 calories. HOW DO I KEEP A FOOD LOG? After each meal, record the following information in your food log:  What you ate.  How much of it you ate.  How many calories it had.  Then, add up your calories. Keep your food log near you, such as in a small notebook in your pocket. Another option is to use a mobile app or website. Some programs will calculate calories for you and show you how many calories you have left each time you add an item to the log. WHAT ARE SOME CALORIE COUNTING TIPS?  Use your calories on foods and drinks that will fill you up and not leave you hungry. Some examples of this include foods like nuts and nut butters, vegetables, lean proteins, and high-fiber foods (more than 5  g fiber per serving).  Eat nutritious foods and avoid empty calories. Empty calories are calories you get from foods or beverages that do not have many nutrients, such as candy and soda. It is better to have a nutritious high-calorie food (such as an avocado) than a food with few nutrients (such as a bag of chips).  Know how many calories are in the foods you eat most often. This way, you do not have to look up how many calories they have each time you eat them.  Look out for foods that may seem like low-calorie foods but are really high-calorie foods, such as baked goods, soda, and fat-free candy.  Pay attention to calories in drinks. Drinks such as sodas, specialty coffee drinks, alcohol, and juices have a lot of calories yet do not fill you up. Choose low-calorie drinks like water and diet drinks.  Focus your calorie counting efforts on higher calorie items. Logging the calories in a garden salad that contains only vegetables is less important than calculating the calories in a milk shake.  Find a way of tracking calories that works for you. Get creative. Most people who are successful find ways to keep track of how much they eat in a day, even if they do not count every calorie. WHAT ARE SOME PORTION CONTROL TIPS?  Know how many calories are in a serving. This will help you know how many servings of a certain food you can have.  Use a measuring cup to measure serving sizes. This is helpful when you start out. With time, you will be able to estimate serving sizes for some foods.  Take some time to put servings of different foods on your favorite plates, bowls, and cups so you know what a serving looks like.  Try not to eat straight from a bag or box. Doing this can lead to overeating. Put the amount you would  like to eat in a cup or on a plate to make sure you are eating the right portion.  Use smaller plates, glasses, and bowls to prevent overeating. This is a quick and easy way to  practice portion control. If your plate is smaller, less food can fit on it.  Try not to multitask while eating, such as watching TV or using your computer. If it is time to eat, sit down at a table and enjoy your food. Doing this will help you to start recognizing when you are full. It will also make you more aware of what and how much you are eating. HOW CAN I CALORIE COUNT WHEN EATING OUT?  Ask for smaller portion sizes or child-sized portions.  Consider sharing an entree and sides instead of getting your own entree.  If you get your own entree, eat only half. Ask for a box at the beginning of your meal and put the rest of your entree in it so you are not tempted to eat it.  Look for the calories on the menu. If calories are listed, choose the lower calorie options.  Choose dishes that include vegetables, fruits, whole grains, low-fat dairy products, and lean protein. Focusing on smart food choices from each of the 5 food groups can help you stay on track at restaurants.  Choose items that are boiled, broiled, grilled, or steamed.  Choose water, milk, unsweetened iced tea, or other drinks without added sugars. If you want an alcoholic beverage, choose a lower calorie option. For example, a regular margarita can have up to 700 calories and a glass of wine has around 150.  Stay away from items that are buttered, battered, fried, or served with cream sauce. Items labeled "crispy" are usually fried, unless stated otherwise.  Ask for dressings, sauces, and syrups on the side. These are usually very high in calories, so do not eat much of them.  Watch out for salads. Many people think salads are a healthy option, but this is often not the case. Many salads come with bacon, fried chicken, lots of cheese, fried chips, and dressing. All of these items have a lot of calories. If you want a salad, choose a garden salad and ask for grilled meats or steak. Ask for the dressing on the side, or ask for  olive oil and vinegar or lemon to use as dressing.  Estimate how many servings of a food you are given. For example, a serving of cooked rice is  cup or about the size of half a tennis ball or one cupcake wrapper. Knowing serving sizes will help you be aware of how much food you are eating at restaurants. The list below tells you how big or small some common portion sizes are based on everyday objects.  1 oz--4 stacked dice.  3 oz--1 deck of cards.  1 tsp--1 dice.  1 Tbsp-- a Ping-Pong ball.  2 Tbsp--1 Ping-Pong ball.   cup--1 tennis ball or 1 cupcake wrapper.  1 cup--1 baseball.   This information is not intended to replace advice given to you by your health care provider. Make sure you discuss any questions you have with your health care provider.   Document Released: 12/04/2005 Document Revised: 12/25/2014 Document Reviewed: 10/09/2013 Elsevier Interactive Patient Education [employer].

## 2016-06-28 NOTE — Progress Notes (Signed)
Subjective:    Patient ID: [name], female    DOB: [birth year], 65 y.o.   MRN: [id number]  [name] is a 65 y.o. female who presents for follow-up of Type 2 diabetes mellitus.  Patient is not checking home blood sugars.   Home blood sugar records: patient does not check sugars How often is blood sugars being checked:  sometimes every 2 weeks- Current symptoms include: none. Patient denies nausea, visual disturbances, vomiting and weight loss.  Patient is checking their feet daily. Any Foot concerns (callous, ulcer, wound, thickened nails, toenail fungus, skin fungus, hammer toe): swelling in feet more in summer Last dilated eye exam: Dr. Einar Gip- within the last year  Current treatments: Continued medications which has been effective. Medication compliance: good  Current diet: "Eats whatever" she wants Current exercise: walks 1-2 times per week Known diabetic complications: none   States blood pressure is always good when she checks it but she does not check it often. Reports good compliance with medication.  Continues to have left knee pain and states she has not followed up with Dr. Marlou Sa at [facility] in a while. Is taking Aleve prn, 1-2 per week and Tramadol occasionally, 1-2 per week. States she has been out of Tramadol and blood pressure medications for past week due to pharmacy issue. She states she has gotten this worked out and should get the medication in next day or two.  She will let me know if she needs refills.   The following portions of the patient's history were reviewed and updated as appropriate: allergies, current medications, past medical history, past social history and problem list.  ROS as in subjective above.     Objective:    Physical Exam Alert and in no distress otherwise not examined.  Blood pressure 130/70, pulse 64, height 5' 6.5" (1.689 m), weight 312 lb 12.8 oz (141.885 kg).  Lab Review Diabetic Labs Latest Ref Rng 06/28/2016  11/26/2015 08/30/2015 08/27/2015 07/09/2014  HbA1c - 6.7% 6.6% 7.0% - -  Chol 125 - 200 mg/dL - - - 198 -  HDL >=46 mg/dL - - - 35(L) -  Calc LDL <130 mg/dL - - - 113 -  Triglycerides <150 mg/dL - - - 252(H) -  Creatinine 0.50 - 0.99 mg/dL - - - 1.05(H) 1.07   BP/Weight 06/28/2016 12/31/2015 11/26/2015 [id number]  Systolic BP [id number] - [id number]  Diastolic BP 70 82 78 - 80  Wt. (Lbs) 312.8 317.6 315.6 318.7 311  BMI 49.74 51.29 50.96 51.46 49.45   Foot/eye exam completion dates Latest Ref Rng 10/01/2015  Eye Exam No Retinopathy No Retinopathy  Foot Form Completion - -    Suni  reports that she has never smoked. She has never used smokeless tobacco. She reports that she does not drink alcohol or use illicit drugs.     Assessment & Plan:    Diabetes mellitus type 2 in obese ([place]) - Plan: POCT glycosylated hemoglobin (Hb A1C), CBC with Differential/Platelet, COMPLETE METABOLIC PANEL WITH GFR  Essential hypertension - Plan: CBC with Differential/Platelet, COMPLETE METABOLIC PANEL WITH GFR  Osteoarthrosis, unspecified whether generalized or localized, involving lower leg  Morbid obesity, unspecified obesity type ([place]) - Plan: TSH  Hypertriglyceridemia - Plan: Lipid panel  1. Rx changes: none 2. Education: Reviewed 'ABCs' of diabetes management (respective goals in parentheses):  A1C (<7), blood pressure (<130/80), and cholesterol (LDL <100). 3. Compliance at present is estimated to be good. Efforts to improve compliance (if necessary)  will be directed at dietary modifications: Count carbohydrates and calories, increased exercise, regular blood sugar monitoring: weekly and continue with weight loss. Congratulated her on losing 5 pounds.. 4. Discussed that for every pound she loses that it is equivalent to taking at least 5 lbs off her joints. Counseled her on continued weight loss to improve joint pain and chronic health conditions.  5. Blood pressure within goal today. No changes to  medication regimen.  6. Osteoarthritis: continue weight loss, tramadol as needed for severe pain. Follow up with Dr Marlou Sa at [facility] 7. Follow up: 2 months  8. Future fasting labs ordered. She will return one morning next week for lab visit. 9. Recommend scheduling med check plus appointment in September.

## 2016-07-05 ENCOUNTER — Other Ambulatory Visit: Payer: Commercial Managed Care - HMO

## 2016-07-05 DIAGNOSIS — E781 Pure hyperglyceridemia: Secondary | ICD-10-CM

## 2016-07-05 DIAGNOSIS — E1169 Type 2 diabetes mellitus with other specified complication: Secondary | ICD-10-CM

## 2016-07-05 DIAGNOSIS — E669 Obesity, unspecified: Principal | ICD-10-CM

## 2016-07-05 DIAGNOSIS — I10 Essential (primary) hypertension: Secondary | ICD-10-CM

## 2016-07-05 LAB — CBC WITH DIFFERENTIAL/PLATELET
Basophils Absolute: 0 cells/uL (ref 0–200)
Basophils Relative: 0 %
Eosinophils Absolute: 67 cells/uL (ref 15–500)
Eosinophils Relative: 1 %
HCT: 36.3 % (ref 35.0–45.0)
Hemoglobin: 11.4 g/dL — ABNORMAL LOW (ref 11.7–15.5)
Lymphocytes Relative: 18 %
Lymphs Abs: 1206 cells/uL (ref 850–3900)
MCH: 29 pg (ref 27.0–33.0)
MCHC: 31.4 g/dL — ABNORMAL LOW (ref 32.0–36.0)
MCV: 92.4 fL (ref 80.0–100.0)
MPV: 10 fL (ref 7.5–12.5)
Monocytes Absolute: 335 cells/uL (ref 200–950)
Monocytes Relative: 5 %
Neutro Abs: 5092 cells/uL (ref 1500–7800)
Neutrophils Relative %: 76 %
Platelets: 310 10*3/uL (ref 140–400)
RBC: 3.93 MIL/uL (ref 3.80–5.10)
RDW: 14.4 % (ref 11.0–15.0)
WBC: 6.7 10*3/uL (ref 4.0–10.5)

## 2016-07-05 LAB — TSH: TSH: 4.63 mIU/L — ABNORMAL HIGH

## 2016-07-06 LAB — COMPLETE METABOLIC PANEL WITH GFR
ALT: 8 U/L (ref 6–29)
AST: 9 U/L — ABNORMAL LOW (ref 10–35)
Albumin: 3.9 g/dL (ref 3.6–5.1)
Alkaline Phosphatase: 66 U/L (ref 33–130)
BUN: 16 mg/dL (ref 7–25)
CO2: 26 mmol/L (ref 20–31)
Calcium: 9.2 mg/dL (ref 8.6–10.4)
Chloride: 102 mmol/L (ref 98–110)
Creat: 0.98 mg/dL (ref 0.50–0.99)
GFR, Est African American: 71 mL/min (ref >=60)
GFR, Est Non African American: 62 mL/min (ref >=60)
Glucose, Bld: 161 mg/dL — ABNORMAL HIGH (ref 65–99)
Potassium: 4.7 mmol/L (ref 3.5–5.3)
Sodium: 139 mmol/L (ref 135–146)
Total Bilirubin: 0.3 mg/dL (ref 0.2–1.2)
Total Protein: 7 g/dL (ref 6.1–8.1)

## 2016-07-06 LAB — LIPID PANEL
Cholesterol: 137 mg/dL (ref 125–200)
HDL: 41 mg/dL — ABNORMAL LOW (ref >=46)
LDL Cholesterol: 64 mg/dL (ref <130)
Total CHOL/HDL Ratio: 3.3 Ratio (ref <=5.0)
Triglycerides: 158 mg/dL — ABNORMAL HIGH (ref <150)
VLDL: 32 mg/dL — ABNORMAL HIGH (ref <30)

## 2016-07-07 ENCOUNTER — Telehealth: Payer: Self-pay | Admitting: Family Medicine

## 2016-07-07 NOTE — Telephone Encounter (Signed)
Pt returned Courtney's call & I informed of lab results and recommendations

## 2016-08-22 ENCOUNTER — Other Ambulatory Visit: Payer: Self-pay | Admitting: Family Medicine

## 2016-08-22 NOTE — Telephone Encounter (Signed)
Is this okay to refill? 

## 2016-08-22 NOTE — Telephone Encounter (Signed)
Called in med to pharmacy and asked pt to call me back about med

## 2016-08-22 NOTE — Telephone Encounter (Signed)
Ok to refill but I would like for her to follow up with [facility] if she has not since our last visit.

## 2016-09-18 ENCOUNTER — Ambulatory Visit: Payer: Commercial Managed Care - HMO | Admitting: Family Medicine

## 2016-09-21 ENCOUNTER — Other Ambulatory Visit (HOSPITAL_COMMUNITY)
Admission: RE | Admit: 2016-09-21 | Discharge: 2016-09-21 | Disposition: A | Discharge status: Home / Self Care | Payer: Commercial Managed Care - HMO | Source: Ambulatory Visit | Attending: Family Medicine | Admitting: Family Medicine

## 2016-09-21 ENCOUNTER — Ambulatory Visit (INDEPENDENT_AMBULATORY_CARE_PROVIDER_SITE_OTHER): Payer: Commercial Managed Care - HMO | Admitting: Family Medicine

## 2016-09-21 ENCOUNTER — Encounter: Payer: Self-pay | Admitting: Family Medicine

## 2016-09-21 VITALS — BP 130/80 | HR 67 | Ht 66.5 in | Wt 311.6 lb

## 2016-09-21 DIAGNOSIS — E785 Hyperlipidemia, unspecified: Secondary | ICD-10-CM

## 2016-09-21 DIAGNOSIS — E2839 Other primary ovarian failure: Secondary | ICD-10-CM

## 2016-09-21 DIAGNOSIS — M1712 Unilateral primary osteoarthritis, left knee: Secondary | ICD-10-CM | POA: Diagnosis not present

## 2016-09-21 DIAGNOSIS — Z23 Encounter for immunization: Secondary | ICD-10-CM | POA: Diagnosis not present

## 2016-09-21 DIAGNOSIS — E1169 Type 2 diabetes mellitus with other specified complication: Secondary | ICD-10-CM

## 2016-09-21 DIAGNOSIS — I10 Essential (primary) hypertension: Secondary | ICD-10-CM

## 2016-09-21 DIAGNOSIS — R946 Abnormal results of thyroid function studies: Secondary | ICD-10-CM

## 2016-09-21 DIAGNOSIS — Z1151 Encounter for screening for human papillomavirus (HPV): Secondary | ICD-10-CM | POA: Diagnosis present

## 2016-09-21 DIAGNOSIS — Z1239 Encounter for other screening for malignant neoplasm of breast: Secondary | ICD-10-CM

## 2016-09-21 DIAGNOSIS — R7989 Other specified abnormal findings of blood chemistry: Secondary | ICD-10-CM

## 2016-09-21 DIAGNOSIS — E669 Obesity, unspecified: Secondary | ICD-10-CM

## 2016-09-21 DIAGNOSIS — Z124 Encounter for screening for malignant neoplasm of cervix: Secondary | ICD-10-CM

## 2016-09-21 DIAGNOSIS — Z0000 Encounter for general adult medical examination without abnormal findings: Secondary | ICD-10-CM | POA: Diagnosis not present

## 2016-09-21 DIAGNOSIS — Z01419 Encounter for gynecological examination (general) (routine) without abnormal findings: Secondary | ICD-10-CM | POA: Diagnosis present

## 2016-09-21 DIAGNOSIS — Z1231 Encounter for screening mammogram for malignant neoplasm of breast: Secondary | ICD-10-CM

## 2016-09-21 NOTE — Patient Instructions (Addendum)
Bring in your advance directives for [place] to scan into the computer.  Call and schedule your mammogram  Return on Friday October 13th for fasting labs.   MEDICARE PREVENTATIVE SERVICES (FEMALE) AND PERSONALIZED PLAN for [name] September 21, 2016  CONDITIONS OR RISKS IDENTIFIED TODAY: Will need to check on alternative medication for cholesterol as discussed since you cannot tolerate statins.  Continue on current medications.  Return for labs.   SPECIFIC RECOMMENDATIONS: Try reducing your daily carbohydrates and sugar and walking 5-10 minutes per day and gradually increase this. Go for your mammogram.   Influenza vaccine: today Pneumococcal vaccine: had pneumovax in 2012. Will need prevnar 13 at age 32 Shingles vaccine: 2016 Tdap vaccine: 2016 Colonoscopy: up to date Mammogram: ordered today.  Pelvic exam: performed today Pap smear: performed today  Aspirin 81mg  nightly for heart health Try getting more exercise, walking 5-10 minutes 2 times per day will help you. Start eating a healthy diet with fresh fruits and vegetables and low sodium, fat, carbohydrates and sugar.   Return October 13 for fasting labs then see me in late October early November for diabetes check.    GENERAL RECOMMENDATIONS FOR GOOD HEALTH:  Supplements:  . Take a daily baby Aspirin 81mg  at bedtime for heart health unless you have a history of gastrointestinal bleed, allergy to aspirin, or are already taking higher dose Aspirin or other antiplatelet or blood thinner medication.   . Consume 1200 mg of Calcium daily through dietary calcium or supplement if you are female age 73 or older, or men 46 and older.   Men aged 38-70 should consume 1000 mg of Calcium daily. . Take 600 IU of Vitamin D daily.  Take 800 IU of Calcium daily if you are older than age 22.  . Take a general multivitamin daily.   Healthy diet: Eat a variety of foods, including fruits, vegetables, vegetable protein such as beans, lentils,  tofu, and grains, such as rice.  Limit meat or animal protein, but if you eat meat, choose leans cuts such as chicken, fish, or [place].  Drink plenty of water daily.  Decrease saturated fat in the diet, avoid lots of red meat, processed foods, sweets, fast foods, and fried foods.  Limit salt and caffeine intake.  Exercise: Aerobic exercise helps maintain good heart health. Weight bearing exercise helps keep bones and muscles working strong.  We recommend at least 30-40 minutes of exercise most days of the week.   Fall prevention: Falls are the leading cause of injuries, accidents, and accidental deaths in people over the age of 53. Falling is a real threat to your ability to live on your own.  Causes include poor eyesight or poor hearing, illness, poor lighting, throw rugs, clutter in your home, and medication side effects causing dizziness or balance problems.  Such medications can include medications for depression, sleep problems, high blood pressure, diabetes, and heart conditions.   PREVENTION  Be sure your home is as safe as possible. Here are some tips:  Wear shoes with non-skid soles (not house slippers).   Be sure your home and outside area are well lit.   Use night lights throughout your house, including hallways and stairways.   Remove clutter and clean up spills on floors and walkways.   Remove throw rugs or fasten them to the floor with carpet tape. Tack down carpet edges.   Do not place electrical cords across pathways.   Install grab bars in your bathtub, shower, and toilet area.  Towel bars should not be used as a grab bar.   Install handrails on both sides of stairways.   Do not climb on stools or stepladders. Get someone else to help with jobs that require climbing.   Do not wax your floors at all, or use a non-skid wax.   Repair uneven or unsafe sidewalks, walkways or stairs.   Keep frequently used items within reach.   Be aware of pets so you do not trip.  Get  regular check-ups from your doctor, and take good care of yourself:  Have your eyes checked every year for vision changes, cataracts, glaucoma, and other eye problems. Wear eyeglasses as directed.   Have your hearing checked every 2 years, or anytime you or others think that you cannot hear well. Use hearing aids as directed.   See your caregiver if you have foot pain or corns. Sore feet can contribute to falls.   Let your caregiver know if a medicine is making you feel dizzy or making you lose your balance.   Use a cane, walker, or wheelchair as directed. Use walker or wheelchair brakes when getting in and out.   When you get up from bed, sit on the side of the bed for 1 to 2 minutes before you stand up. This will give your blood pressure time to adjust, and you will feel less dizzy.   If you need to go to the bathroom often, consider using a bedside commode.  Disease prevention:  If you smoke or chew tobacco, find out from your caregiver how to quit. It can literally save your life, no matter how long you have been a tobacco user. If you do not use tobacco, never begin. Medicare does cover some smoking cessation counseling.  Maintain a healthy diet and normal weight. Increased weight leads to problems with blood pressure and diabetes. We check your height, weight, and BMI as part of your yearly visit.  The Body Mass Index or BMI is a way of measuring how much of your body is fat. Having a BMI above 27 increases the risk of heart disease, diabetes, hypertension, stroke and other problems related to obesity. Your caregiver can help determine your BMI and based on it develop an exercise and dietary program to help you achieve or maintain this important measurement at a healthful level.  High blood pressure causes heart and blood vessel problems.  Persistent high blood pressure should be treated with medicine if weight loss and exercise do not work.  We check your blood pressure as part of your  yearly visit.  Avoid drinking alcohol in excess (more than two drinks per day).  Avoid use of street drugs. Do not share needles with anyone. Ask for professional help if you need assistance or instructions on stopping the use of alcohol, cigarettes, and/or drugs.  Brush your teeth twice a day with fluoride toothpaste, and floss once a day. Good oral hygiene prevents tooth decay and gum disease. The problems can be painful, unattractive, and can cause other health problems. Visit your dentist for a routine oral and dental checkup and preventive care every 6-12 months.   See your eye doctor yearly for routine screening for things like glaucoma.  Look at your skin regularly.  Use a mirror to look at your back. Notify your caregivers of changes in moles, especially if there are changes in shapes, colors, a size larger than a pencil eraser, an irregular border, or development of new moles.  Safety:  Use seatbelts 100% of the time, whether driving or as a passenger.  Use safety devices such as hearing protection if you work in environments with loud noise or significant background noise.  Use safety glasses when doing any work that could send debris in to the eyes.  Use a helmet if you ride a bike or motorcycle.  Use appropriate safety gear for contact sports.  Talk to your caregiver about gun safety.  Use sunscreen with a SPF (or skin protection factor) of 15 or greater.  Lighter skinned people are at a greater risk of skin cancer. Don't forget to also wear sunglasses in order to protect your eyes from too much damaging sunlight. Damaging sunlight can accelerate cataract formation.   If you have multiple sexual partners, or if you are not in a monogamous relationship, practice safe sex. Use condoms. Condoms are used to help reduce the spread of sexually transmitted infections (or STIs).  Consider an HIV test if you have never been tested.  Consider routine screening for STIs if you have multiple sexual  partners.   Keep carbon monoxide and smoke detectors in your home functioning at all times. Change the batteries every 6 months or use a model that plugs into the wall or is hard wired in.   END OF LIFE PLANNING/ADVANCED DIRECTIVES Advance health-care planning is deciding the kind of care you want at the end of life. While alert competent adults are able to exercise their rights to make health care and financial decisions, problems arise when an individual becomes unconscious, incapacitated, or otherwise unable to communicate or make such decisions. Advance health care directives are the legal documents in which you give written instructions about your choices limited, aggressive or palliative care if, in the future, you cannot speak for yourself.  Advanced directives include the following: [place] allows you to appoint someone to act as your health care agent to make health care decisions for you should it be determined by your health care provider that you are no longer able to make these decisions for yourself.  A Living Will is a legal document in which you can declare that under certain conditions you desire your life not be prolonged by extraordinary or artificial means during your last illness or when you are near death. We can provide you with sample advanced directives, you can get an attorney to prepare these for you, or you can visit [place] Secretary of State's website for additional information and resources at [email or web address]  Further, I recommend you have an attorney prepare a Will and Durable Power of Attorney if you haven't done so already.  Please get [place] a copy of your health care Advanced Directives.   PREVENTATIV E CARE RECOMMENDATIONS:  Vaccinations: We recommend the following vaccinations as part of your preventative care:  Pneumococcal vaccine is recommended to protect against certain types of pneumonia.  This is normally  recommended for adults age 107 or older once, or up to every 5 years for those at high risk.  The vaccine is also recommended for adults younger than 63 years old with certain underlying conditions that make them high risk for pneumonia.  Influenza vaccine is recommended to protect against seasonal influenza or "the flu." Influenza is a serious disease that can lead to hospitalization and sometimes even death. Traditional flu vaccines (called trivalent vaccines) are made to protect against three flu viruses; an influenza A (H1N1) virus, an influenza A (H3N2) virus, and an influenza B  virus. In addition, there are flu vaccines made to protect against four flu viruses (called "quadrivalent" vaccines). These vaccines protect against the same viruses as the trivalent vaccine and an additional B virus.  We recommend the high dose influenza vaccine to those 65 years and older.  Hepatitis B vaccine to protect against a form of infection of the liver by a virus acquired from blood or body fluids, particularly for high risk groups.  Td or Tdap vaccine to protect against Tetanus, diphtheria and pertussis which can be very serious.  These diseases are caused by bacteria.  Diphtheria and pertussis are spread from person to person through coughing or sneezing.  Tetanus enters the body through cuts, scratches, or wounds.  Tetanus (Lockjaw) causes painful muscle tightening and stiffness, usually all over the body.  Diphtheria can cause a thick coating to form in the back of the throat.  It can lead to breathing problems, paralysis, heart failure, and death.  Pertussis (Whooping Cough) causes severe coughing spells, which can cause difficulty breathing, vomiting and disturbed sleep.  Td or Tdap is usually given every 10 years.  Shingles vaccine to protect against Varicella Zoster if you are older than age 80, or younger than 63 years old with certain underlying illness.    Cancer Screening: Most routine colon cancer  screening begins at the age of 81.  Subsequent colonoscopies are performed either every 5-10 years for normal screening, or every 2-5 years for higher risks patients, up until age 15 years of age. Annual screening is done with easy to use take-home tests to check for hidden blood in the stool called hemoccult tests.  Sigmoidoscopy or colonoscopy can detect the earliest forms of colon cancer and is life saving. These tests use a small camera at the end of a tube to directly examine the colon.   Pelvic Exam and Pap Smear: Pelvic exams and pap smears are performed routinely to evaluate for abnormalities as well as cancers including cervical and vaginal cancers.  This is generally performed every 2-3 years for most women, or more frequently for higher risk patients.  Mammograms: Mammograms are used to screen for breast cancer.  Medicare covers baseline screening once from ages 23-55 years old, but will cover mammograms yearly for those 40 years and older.  In accordance with other guidelines, you may not need a mammogram every year though.  The decision on how frequently you need a mammogram should be discussed with you medical provider.    Osteoporosis Screening: Screening for osteoporosis usually begins at age 67 for women, and can be done as frequent as every 2 years.  However, women or men with higher risk of osteoporosis may be screened earlier than age 12.  Osteoporosis or low bone mass is diminished bone strength from alterations in bone architecture leading to bone fragility and increased fracture risk.     Cardiovascular Screening: Fat and cholesterol leaves deposits in your arteries that can block them. This causes heart disease and vessel disease elsewhere in your body.  If your cholesterol is found to be high, or if you have heart disease or certain other medical conditions, then you may need to have your cholesterol monitored frequently and be treated with medication. Cardiovascular screening in  the form of lab tests for cholesterol, HDL and triglycerides can be done every 5 years.  A screening electrocardiogram can be done as part of the Welcome to Medicare physical.  Diabetes Screening: Diabetes screening can be done at least every 3  years for those with risk factors,  or every 6-62months for prediabetic patients.  Screening includes fasting blood sugar test or glucose tolerance test.  Risk factors include hypertension, dyslipidemia, obesity, previously abnormal glucose tests, family history of diabetes, age 52 years or older, and history of gestations diabetes.   AAA (abdominal aortic aneurysm) Screening: Medicare allows for a one time ultrasound to screen for abdominal aortic aneurysm if done as a referral as part of the Welcome to Medicare exam.  Men eligible for this screening include those men between age 60-34 years of age who have smoked at least 100 cigarettes in his lifetime and/or has a family history of AAA.  HIV Screening:  Medicare allows for yearly screening for patients at high risk for contracting HIV disease.

## 2016-09-21 NOTE — Progress Notes (Addendum)
[name] is a 65 y.o. female who presents for annual wellness visit and follow-up on chronic medical conditions.  She has the following concerns: Complains of upper leg cramps when taking statin. When she does not take the statin she does not have leg cramps. She states she has failed simvastatin in the past and now is not able to tolerate atorvastatin. She added co-enzyme Q-10 and this did not help with muscle cramps.  No issues with diabetes medications.  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 No new symptoms. Pain is unchanged.  She is aware that her BMI places her in the morbidly obese category. Has not been doing anything to improve this.    Immunization History  Administered Date(s) Administered  . Influenza,inj,Quad PF,36+ Mos 09/21/2016  . Pneumococcal Polysaccharide-23 08/01/2006  . Td 12/21/2003   Last Pap smear: years ago- will attempt to do this today.  Last mammogram: 2 years ago, due  Last colonoscopy: 3-4 years ago, due 10 years.  Last DEXA: never Dentist: has dentures Ophtho: last year Exercise: physically inactive and states she just does not want to exercise.   Other doctors caring for patient include: NONE   Depression screen:  See questionnaire below.  Depression screen [facility] 2/9 09/21/2016 09/22/2015 08/19/2015  Decreased Interest 0 0 0  Down, Depressed, Hopeless 0 0 0  PHQ - 2 Score 0 0 0    Fall Risk Screen: see questionnaire below. Fall Risk  09/21/2016 09/22/2015 08/19/2015  Falls in the past year? No No No    ADL screen:  See questionnaire below Functional Status Survey: Is the patient deaf or have difficulty hearing?: Yes (left ear sometimes) Does the patient have difficulty seeing, even when wearing glasses/contacts?: No Does the patient have difficulty concentrating, remembering, or making decisions?: No Does the patient have difficulty walking or climbing stairs?: Yes (pain) Does the patient have  difficulty dressing or bathing?: No Does the patient have difficulty doing errands alone such as visiting a doctor's office or shopping?: No   End of Life Discussion:  Patient has a living will and medical power of attorney. Will bring these by for [place] to scan into her chart.   Review of Systems Constitutional: -fever, -chills, -sweats, -unexpected weight change, -anorexia, -fatigue Allergy: -sneezing, -itching, -congestion Dermatology: denies changing moles, rash, lumps, new worrisome lesions ENT: -runny nose, -ear pain, -sore throat, -hoarseness, -sinus pain, -teeth pain, -tinnitus, ?hearing loss, -epistaxis Cardiology:  -chest pain, -palpitations, -edema, -orthopnea, -paroxysmal nocturnal dyspnea Respiratory: -cough, -shortness of breath, -dyspnea on exertion, -wheezing, -hemoptysis Gastroenterology: -abdominal pain, -nausea, -vomiting, -diarrhea, -constipation, -blood in stool, -changes in bowel movement, -dysphagia Hematology: -bleeding or bruising problems Musculoskeletal: +arthralgias, bilateral knee pain chronic, +myalgias when taking statin, -joint swelling, -back pain, -neck pain, + bilateral thigh cramping when taking statin, -gait changes Ophthalmology: -vision changes, -eye redness, -itching, -discharge Urology: -dysuria, -difficulty urinating, -hematuria, -urinary frequency, -urgency, incontinence Neurology: -headache, -weakness, -tingling, -numbness, -speech abnormality, -memory loss, -falls, -dizziness Psychology:  -depressed mood, -agitation, -sleep problems    PHYSICAL EXAM:  BP 130/80   Pulse 67   Ht 5' 6.5" (1.689 m)   Wt (!) 311 lb 9.6 oz (141.3 kg)   BMI 49.54 kg/m   General Appearance: Alert, cooperative, no distress, appears stated age Head: Normocephalic, without obvious abnormality, atraumatic Eyes: PERRL, conjunctiva/corneas clear, EOM's intact, fundi benign Ears: Normal TM's and external ear canals Nose: Nares normal, mucosa normal, no drainage or sinus  tenderness Throat: Lips, mucosa, and tongue normal; teeth and gums normal, full dentures to top and partial to lower Neck: Supple, no lymphadenopathy; thyroid: no enlargement/tenderness/nodules; no carotid bruit or JVD Back: Spine nontender, no curvature, ROM normal, no CVA tenderness Lungs: Clear to auscultation bilaterally without wheezes, rales or ronchi; respirations unlabored Chest Wall: No tenderness or deformity Heart: Regular rate and rhythm, S1 and S2 normal, no murmur, rub or gallop Breast Exam: declined. Up to date with mammogram Abdomen: Soft, non-tender, obese, nondistended, normoactive bowel sounds, no masses, no hepatosplenomegaly Genitalia: Normal external genitalia without lesions.  BUS and vagina normal; cervix without lesions, or cervical motion tenderness. No abnormal vaginal discharge.  Uterus and adnexa not enlarged, nontender, no masses.  Pap performed. Chaperone present.  Rectal: declined. Up to date with colonoscopy.  Extremities: No clubbing, cyanosis or edema Pulses: 2+ and symmetric all extremities Skin: Skin color, texture, turgor normal, no rashes or lesions Lymph nodes: Cervical, supraclavicular, and axillary nodes normal Neurologic: CNII-XII intact, normal strength, sensation and gait; reflexes 2+ and symmetric throughout Psych: Normal mood, affect, hygiene and grooming.  ASSESSMENT/PLAN: Medicare annual wellness visit, subsequent  Essential hypertension - Plan: CBC with Differential/Platelet, COMPLETE METABOLIC PANEL WITH GFR  Diabetes mellitus type 2 in obese ([place]) - Plan: CBC with Differential/Platelet, Hemoglobin A1c  OBESITY, MORBID  Screening for breast cancer - Plan: MM DIGITAL SCREENING BILATERAL  Need for influenza vaccination - Plan: Flu Vaccine QUAD 36+ mos IM  Screening for cervical cancer - Plan: Cytology - PAP  Hyperlipidemia associated with type 2 diabetes mellitus (HCC) - Plan: COMPLETE METABOLIC PANEL WITH GFR, Lipid panel  Elevated  TSH - Plan: TSH, T4, Free, T3  Estrogen deficiency - Plan: VITAMIN D 25 Hydroxy (Vit-D Deficiency, Fractures)  Osteoarthritis of left knee, unspecified osteoarthritis type  Routine general medical examination at a health care facility   Blood pressure is well controlled. Plan to continue on current medications.  Continue on current diabetes medications and will get a hemoglobin A1C.  She will stop atorvastatin for now and see if muscle cramps resolve. Suspect that she is intolerant to statins since she has now failed 2 different ones and is still having muscle aches and cramps even on a low dose atorvastatin. Will check and see if she meets criteria for alternative cholesterol treatment. TSH was marginally elevated and will repeat this including free T4 and T3.  Estrogen deficiency- no history of fracture, is taking vitamin D. Will check vitamin D level. Plan to order bone density at age 11.  Advised that a small amount of weight loss will help with overall health and osteoarthritis. She does not appear motivated to start exercising.    Future labs in computer. She will return on Friday October 13 for fasting labs. She is not fasting today and is not due for Hgb A1C.  Tdap up to date per patient last year at [facility], no documentation.  Shingles up to date.  Needs mammogram- order in the computer.  Flu shot given.  Pap smear performed- chaperone present.   Discussed monthly self breast exams and yearly mammograms; at least 30 minutes of aerobic activity at least 5 days/week and weight-bearing exercise 2x/week; proper sunscreen use reviewed; healthy diet, including goals of calcium and vitamin D intake and alcohol recommendations (less than or equal to 1 drink/day) reviewed; regular seatbelt use; changing batteries in smoke detectors.  Immunization recommendations discussed.  Colonoscopy recommendations reviewed  Follow up pending labs. Will need to check on whether she  meets criteria for  alternative cholesterol treatment.    Medicare Attestation I have personally reviewed: The patient's medical and social history Their use of alcohol, tobacco or illicit drugs Their current medications and supplements The patient's functional ability including ADLs,fall risks, home safety risks, cognitive, and hearing and visual impairment Diet and physical activities Evidence for depression or mood disorders  The patient's weight, height, and BMI have been recorded in the chart.  I have made referrals, counseling, and provided education to the patient based on review of the above and I have provided the patient with a written personalized care plan for preventive services.     Harland Dingwall, NP   09/22/2016

## 2016-09-25 ENCOUNTER — Encounter: Payer: Self-pay | Admitting: Internal Medicine

## 2016-09-25 LAB — CYTOLOGY - PAP

## 2016-09-27 ENCOUNTER — Other Ambulatory Visit: Payer: Self-pay | Admitting: Family Medicine

## 2016-09-29 ENCOUNTER — Other Ambulatory Visit: Payer: Commercial Managed Care - HMO

## 2016-09-29 DIAGNOSIS — E669 Obesity, unspecified: Secondary | ICD-10-CM

## 2016-09-29 DIAGNOSIS — E785 Hyperlipidemia, unspecified: Secondary | ICD-10-CM

## 2016-09-29 DIAGNOSIS — I10 Essential (primary) hypertension: Secondary | ICD-10-CM

## 2016-09-29 DIAGNOSIS — E2839 Other primary ovarian failure: Secondary | ICD-10-CM

## 2016-09-29 DIAGNOSIS — R7989 Other specified abnormal findings of blood chemistry: Secondary | ICD-10-CM

## 2016-09-29 DIAGNOSIS — E1169 Type 2 diabetes mellitus with other specified complication: Secondary | ICD-10-CM

## 2016-09-29 LAB — COMPLETE METABOLIC PANEL WITH GFR
ALT: 6 U/L (ref 6–29)
AST: 8 U/L — ABNORMAL LOW (ref 10–35)
Albumin: 3.8 g/dL (ref 3.6–5.1)
Alkaline Phosphatase: 68 U/L (ref 33–130)
BUN: 20 mg/dL (ref 7–25)
CO2: 23 mmol/L (ref 20–31)
Calcium: 8.9 mg/dL (ref 8.6–10.4)
Chloride: 103 mmol/L (ref 98–110)
Creat: 1.12 mg/dL — ABNORMAL HIGH (ref 0.50–0.99)
GFR, Est African American: 60 mL/min (ref >=60)
GFR, Est Non African American: 52 mL/min — ABNORMAL LOW (ref >=60)
Glucose, Bld: 149 mg/dL — ABNORMAL HIGH (ref 65–99)
Potassium: 4.2 mmol/L (ref 3.5–5.3)
Sodium: 140 mmol/L (ref 135–146)
Total Bilirubin: 0.4 mg/dL (ref 0.2–1.2)
Total Protein: 7 g/dL (ref 6.1–8.1)

## 2016-09-29 LAB — LIPID PANEL
Cholesterol: 146 mg/dL (ref 125–200)
HDL: 30 mg/dL — ABNORMAL LOW (ref >=46)
LDL Cholesterol: 69 mg/dL (ref <130)
Total CHOL/HDL Ratio: 4.9 Ratio (ref <=5.0)
Triglycerides: 237 mg/dL — ABNORMAL HIGH (ref <150)
VLDL: 47 mg/dL — ABNORMAL HIGH (ref <30)

## 2016-09-29 LAB — CBC WITH DIFFERENTIAL/PLATELET
Basophils Absolute: 0 cells/uL (ref 0–200)
Basophils Relative: 0 %
Eosinophils Absolute: 142 cells/uL (ref 15–500)
Eosinophils Relative: 2 %
HCT: 36.1 % (ref 35.0–45.0)
Hemoglobin: 11.4 g/dL — ABNORMAL LOW (ref 11.7–15.5)
Lymphocytes Relative: 23 %
Lymphs Abs: 1633 cells/uL (ref 850–3900)
MCH: 29.2 pg (ref 27.0–33.0)
MCHC: 31.6 g/dL — ABNORMAL LOW (ref 32.0–36.0)
MCV: 92.6 fL (ref 80.0–100.0)
MPV: 9.9 fL (ref 7.5–12.5)
Monocytes Absolute: 426 cells/uL (ref 200–950)
Monocytes Relative: 6 %
Neutro Abs: 4899 cells/uL (ref 1500–7800)
Neutrophils Relative %: 69 %
Platelets: 293 10*3/uL (ref 140–400)
RBC: 3.9 MIL/uL (ref 3.80–5.10)
RDW: 13.9 % (ref 11.0–15.0)
WBC: 7.1 10*3/uL (ref 4.0–10.5)

## 2016-09-29 LAB — HEMOGLOBIN A1C
Hgb A1c MFr Bld: 6.5 % — ABNORMAL HIGH (ref <5.7)
Mean Plasma Glucose: 140 mg/dL

## 2016-09-29 LAB — TSH: TSH: 7.31 mIU/L — ABNORMAL HIGH

## 2016-09-29 LAB — T4, FREE: Free T4: 1.3 ng/dL (ref 0.8–1.8)

## 2016-09-30 LAB — VITAMIN D 25 HYDROXY (VIT D DEFICIENCY, FRACTURES): Vit D, 25-Hydroxy: 31 ng/mL (ref 30–100)

## 2016-09-30 LAB — T3: T3, Total: 137 ng/dL (ref 76–181)

## 2016-10-02 ENCOUNTER — Other Ambulatory Visit: Payer: Self-pay | Admitting: Family Medicine

## 2016-10-03 ENCOUNTER — Ambulatory Visit
Admission: RE | Admit: 2016-10-03 | Discharge: 2016-10-03 | Disposition: A | Discharge status: Home / Self Care | Payer: Commercial Managed Care - HMO | Source: Ambulatory Visit | Attending: Family Medicine | Admitting: Family Medicine

## 2016-10-03 DIAGNOSIS — Z1239 Encounter for other screening for malignant neoplasm of breast: Secondary | ICD-10-CM

## 2016-10-07 ENCOUNTER — Other Ambulatory Visit: Payer: Self-pay | Admitting: Family Medicine

## 2016-10-09 NOTE — Telephone Encounter (Signed)
Ok to refill 

## 2016-10-09 NOTE — Telephone Encounter (Signed)
Called med in to pharmacy for 3 months

## 2016-10-09 NOTE — Telephone Encounter (Signed)
Is this okay to call in to pharmacy. Pt is coming in October 25th for an appt with you

## 2016-10-11 ENCOUNTER — Encounter: Payer: Self-pay | Admitting: Family Medicine

## 2016-10-11 ENCOUNTER — Ambulatory Visit (INDEPENDENT_AMBULATORY_CARE_PROVIDER_SITE_OTHER): Payer: Commercial Managed Care - HMO | Admitting: Family Medicine

## 2016-10-11 VITALS — BP 132/78 | HR 82 | Wt 315.2 lb

## 2016-10-11 DIAGNOSIS — I10 Essential (primary) hypertension: Secondary | ICD-10-CM | POA: Diagnosis not present

## 2016-10-11 DIAGNOSIS — E039 Hypothyroidism, unspecified: Secondary | ICD-10-CM

## 2016-10-11 DIAGNOSIS — E1169 Type 2 diabetes mellitus with other specified complication: Secondary | ICD-10-CM | POA: Diagnosis not present

## 2016-10-11 DIAGNOSIS — R7989 Other specified abnormal findings of blood chemistry: Secondary | ICD-10-CM | POA: Diagnosis not present

## 2016-10-11 DIAGNOSIS — E781 Pure hyperglyceridemia: Secondary | ICD-10-CM

## 2016-10-11 DIAGNOSIS — E669 Obesity, unspecified: Secondary | ICD-10-CM | POA: Diagnosis not present

## 2016-10-11 DIAGNOSIS — H9192 Unspecified hearing loss, left ear: Secondary | ICD-10-CM | POA: Insufficient documentation

## 2016-10-11 DIAGNOSIS — E038 Other specified hypothyroidism: Secondary | ICD-10-CM

## 2016-10-11 HISTORY — DX: Other specified hypothyroidism: E03.8

## 2016-10-11 NOTE — Patient Instructions (Addendum)
Stop taking the atorvastatin. Start taking fish oil as discussed. Start walking for exercise and eating a healthy low fat diet.  Call and schedule your dilated eye exam.  I would like for you to return for a lab visit and for to 6 weeks to have your thyroid and kidney function rechecked. Make sure you're staying well hydrated. Return in 3 months fasting and recheck your cholesterol and for a visit with me.  Food Choices to Lower Your Triglycerides Triglycerides are a type of fat in your blood. High levels of triglycerides can increase the risk of heart disease and stroke. If your triglyceride levels are high, the foods you eat and your eating habits are very important. Choosing the right foods can help lower your triglycerides.  WHAT GENERAL GUIDELINES DO I NEED TO FOLLOW?  Lose weight if you are overweight.   Limit or avoid alcohol.   Fill one half of your plate with vegetables and green salads.   Limit fruit to two servings a day. Choose fruit instead of juice.   Make one fourth of your plate whole grains. Look for the word "whole" as the first word in the ingredient list.  Fill one fourth of your plate with lean protein foods.  Enjoy fatty fish (such as salmon, mackerel, sardines, and tuna) three times a week.   Choose healthy fats.   Limit foods high in starch and sugar.  Eat more home-cooked food and less restaurant, buffet, and fast food.  Limit fried foods.  Cook foods using methods other than frying.  Limit saturated fats.  Check ingredient lists to avoid foods with partially hydrogenated oils (trans fats) in them. WHAT FOODS CAN I EAT?  Grains Whole grains, such as whole wheat or whole grain breads, crackers, cereals, and pasta. Unsweetened oatmeal, bulgur, barley, quinoa, or brown rice. Corn or whole wheat flour tortillas.  Vegetables Fresh or frozen vegetables (raw, steamed, roasted, or grilled). Green salads. Fruits All fresh, canned (in natural juice), or  frozen fruits. Meat and Other Protein Products Ground beef (85% or leaner), grass-fed beef, or beef trimmed of fat. Skinless chicken or [place]. Ground chicken or [place]. Pork trimmed of fat. All fish and seafood. Eggs. Dried beans, peas, or lentils. Unsalted nuts or seeds. Unsalted canned or dry beans. Dairy Low-fat dairy products, such as skim or 1% milk, 2% or reduced-fat cheeses, low-fat ricotta or cottage cheese, or plain low-fat yogurt. Fats and Oils Tub margarines without trans fats. Light or reduced-fat mayonnaise and salad dressings. Avocado. Safflower, olive, or canola oils. Natural peanut or almond butter. The items listed above may not be a complete list of recommended foods or beverages. Contact your dietitian for more options. WHAT FOODS ARE NOT RECOMMENDED?  Grains White bread. White pasta. White rice. Cornbread. Bagels, pastries, and croissants. Crackers that contain trans fat. Vegetables White potatoes. Corn. Creamed or fried vegetables. Vegetables in a cheese sauce. Fruits Dried fruits. Canned fruit in light or heavy syrup. Fruit juice. Meat and Other Protein Products Fatty cuts of meat. Ribs, chicken wings, bacon, sausage, bologna, salami, chitterlings, fatback, hot dogs, bratwurst, and packaged luncheon meats. Dairy Whole or 2% milk, cream, half-and-half, and cream cheese. Whole-fat or sweetened yogurt. Full-fat cheeses. Nondairy creamers and whipped toppings. Processed cheese, cheese spreads, or cheese curds. Sweets and Desserts Corn syrup, sugars, honey, and molasses. Candy. Jam and jelly. Syrup. Sweetened cereals. Cookies, pies, cakes, donuts, muffins, and ice cream. Fats and Oils Butter, stick margarine, lard, shortening, ghee, or bacon fat. Coconut,  palm kernel, or palm oils. Beverages Alcohol. Sweetened drinks (such as sodas, lemonade, and fruit drinks or punches). The items listed above may not be a complete list of foods and beverages to avoid. Contact your  dietitian for more information.   This information is not intended to replace advice given to you by your health care provider. Make sure you discuss any questions you have with your health care provider.   Document Released: 09/21/2004 Document Revised: 12/25/2014 Document Reviewed: 10/08/2013 Elsevier Interactive Patient Education 2016 [place] Oil, Omega-3 Fatty Acids capsules (OTC) What is this medicine? FISH OIL, OMEGA-3 FATTY ACIDS (Fish Oil, oh MAY [place] - 3 fatty AS ids) are essential fats. It is promoted to help support a healthy heart. This dietary supplement is used to add to a healthy diet. The FDA has not approved this supplement for any medical use. This supplement may be used for other purposes; ask your health care provider or pharmacist if you have questions. This medicine may be used for other purposes; ask your health care provider or pharmacist if you have questions. What should I tell my health care provider before I take this medicine? They need to know if you have any of these conditions -bleeding problems -lung or breathing disease, like asthma -an unusual or allergic reaction to fish oil, omega-3 fatty acids, fish, other medicines, foods, dyes, or preservatives -pregnant or trying to get pregnant -breast-feeding How should I use this medicine? Take this medicine by mouth with a glass of water. Follow the directions on the package or prescription label. Take with food. Take your medicine at regular intervals. Do not take your medicine more often than directed. Talk to your pediatrician regarding the use of this medicine in children. Special care may be needed. This medicine should not be used in children without a doctor's advice. Overdosage: If you think you have taken too much of this medicine contact a poison control center or emergency room at once. NOTE: This medicine is only for you. Do not share this medicine with others. What if I miss a dose? If  you miss a dose, take it as soon as you can. If it is almost time for your next dose, take only that dose. Do not take double or extra doses. What may interact with this medicine? -aspirin and aspirin-like medicines -herbal products like danshen, dong quai, garlic pills, ginger, ginkgo biloba, horse chestnut, willow bark, and others -medicines that treat or prevent blood clots like enoxaparin, heparin, warfarin This list may not describe all possible interactions. Give your health care provider a list of all the medicines, herbs, non-prescription drugs, or dietary supplements you use. Also tell them if you smoke, drink alcohol, or use illegal drugs. Some items may interact with your medicine. What should I watch for while using this medicine? Follow a good diet and exercise plan. Taking a dietary supplement does not replace a healthy lifestyle. Some foods that have omega-3 fatty acids naturally are fatty fish like albacore tuna, halibut, herring, mackerel, lake trout, salmon, and sardines. Too much of this supplement can be unsafe. Talk to your doctor or health care provider about how much of this supplement is right for you. If you are scheduled for any medical or dental procedure, tell your healthcare provider that you are taking this medicine. You may need to stop taking this medicine before the procedure. Herbal or dietary supplements are not regulated like medicines. Rigid quality control standards are not required  for dietary supplements. The purity and strength of these products can vary. The safety and effect of this dietary supplement for a certain disease or illness is not well known. This product is not intended to diagnose, treat, cure or prevent any disease. The Food and Drug Administration suggests the following to help consumers protect themselves: -Always read product labels and follow directions. -Natural does not mean a product is safe for humans to take. -Look for products that  include USP after the ingredient name. This means that the manufacturer followed the standards of the [place] Pharmacopoeia. -Supplements made or sold by a nationally known food or drug company are more likely to be made under tight controls. You can write to the company for more information about how the product was made. What side effects may I notice from receiving this medicine? Side effects that you should report to your doctor or health care professional as soon as possible: -allergic reactions like skin rash, itching or hives, swelling of the face, lips, or tongue -breathing problems -changes in your moods or emotions -unusual bleeding or bruising Side effects that usually do not require medical attention (report to your doctor or health care professional if they continue or are bothersome): -bad or fishy breath -belching -diarrhea -nausea -stomach gas, upset -weight gain This list may not describe all possible side effects. Call your doctor for medical advice about side effects. You may report side effects to FDA at 1-800-FDA-1088. Where should I keep my medicine? Keep out of the reach of children. Store at room temperature or as directed on the package label. Protect from moisture. Do not freeze. Throw away any unused medicine after the expiration date. NOTE: This sheet is a summary. It may not cover all possible information. If you have questions about this medicine, talk to your doctor, pharmacist, or health care provider.    2016, Elsevier/Gold Standard. (2015-03-25 09:36:32)

## 2016-10-11 NOTE — Progress Notes (Signed)
Subjective:    Patient ID: [name], female    DOB: [birth year], 65 y.o.   MRN: [id number]  HPI Chief Complaint  Patient presents with  . diabetes    diabetes and go over labs.    She is here for follow-up on abnormal lab results. States she is having hearing issues in her left ear and would like it checked. This is ongoing, denies tinnitus, dizziness, fever, chills, headache.   Her TSH is elevated compared to 3 months ago, however, her T3 and free T4 values are normal. She is asymptomatic currently.   Hemoglobin A1c is 6.5. This has improved. She denies having any diabetic complications. Checks her feet daily.  States she had an eye exam last year. October 01, 2015. States it was normal. She uses reading glasses.   She is statin intolerant. Complains of leg cramps to RLE for 2 days last week after taking atorvastatin. States she has leg cramps every time she takes atorvastatin. She has stopped taking it and her cramps have resolved.   No concerns with blood pressure.  She is aware that she has gained weight. States she is not exercising and is eating "whatever I want"    She denies fever, chills, dizziness, headache, chest pain, palpitations, cough, DOE, PND, abdominal pain, N/V/D or constipation. Denies urinary symptoms. No leg pain with walking.    Review of Systems Pertinent positives and negatives in the history of present illness.     Objective:   Physical Exam BP 132/78   Pulse 82   Wt (!) 315 lb 3.2 oz (143 kg)   BMI 50.11 kg/m  Alert and in no distress. Tympanic membranes and canals are normal. Pharyngeal area is normal. Neck is supple without adenopathy or thyromegaly. Cardiac exam shows a regular sinus rhythm without murmurs or gallops. Lungs are clear to auscultation. LE without edema, normal pulses, capillary refill, sensation, ROM and strength. Diabetic foot exam performed with normal results, monofilament test shows sensation intact.      Assessment &  Plan:  Essential hypertension  Diabetes mellitus type 2 in obese ([place]) - Plan: Microalbumin / creatinine urine ratio  Hypertriglyceridemia  Subclinical hypothyroidism - Plan: TSH, T4, Free, T3  Decreased hearing of left ear  Morbid obesity (HCC)  Elevated serum creatinine - Plan: Basic metabolic panel  Blood pressure is in normal range. Continue on current medication.discussed low sodium diet and being more physically active.  Her last A1c is within goal range. No issues or complications. Recommend she continue on current medication. Reinforced lifestyle modifications. Counseled on being sedentary leading to increased weight gain and worsening health.   Discussed that the muscle cramp she had last week appears to be related to the atorvastatin. She has now failed 2 statin medications. Recommend she stop taking atorvastatin. Her LDL is in normal range. Discussed diet and exercise to lower triglycerides and possibly increase HDL. She will also start taking Fish oil as well. Encouraged her to start walking for exercise. Encouraged her to contact friends or family to do this with. Patient states she is up-to-date on her Tdap and Zostavax. She had this at [facility] however we do not have documentation. Discussed that her ear exam is normal and her hearing test is mildly abnormal. I am okay referring her for formal hearing test if she desires. She will call and let me know. Recommend that she return in 4-6 weeks for repeat TSH and serum creatinine due to elevated serum creatinine. Orders are  in the computer.  Plan to recheck fasting lipids in 3 months since she is stopping atorvastatin.

## 2016-10-12 LAB — MICROALBUMIN / CREATININE URINE RATIO
Creatinine, Urine: 119 mg/dL (ref 20–320)
Microalb Creat Ratio: 3 mcg/mg creat (ref <30)
Microalb, Ur: 0.4 mg/dL

## 2016-11-07 ENCOUNTER — Other Ambulatory Visit: Payer: Commercial Managed Care - HMO

## 2016-11-08 ENCOUNTER — Other Ambulatory Visit: Payer: Commercial Managed Care - HMO

## 2016-11-08 DIAGNOSIS — R7989 Other specified abnormal findings of blood chemistry: Secondary | ICD-10-CM

## 2016-11-08 DIAGNOSIS — E039 Hypothyroidism, unspecified: Secondary | ICD-10-CM

## 2016-11-08 DIAGNOSIS — E038 Other specified hypothyroidism: Secondary | ICD-10-CM

## 2016-11-08 LAB — TSH: TSH: 4.05 mIU/L

## 2016-11-08 LAB — T4, FREE: Free T4: 1.3 ng/dL (ref 0.8–1.8)

## 2016-11-09 LAB — BASIC METABOLIC PANEL
BUN: 19 mg/dL (ref 7–25)
CO2: 25 mmol/L (ref 20–31)
Calcium: 9.2 mg/dL (ref 8.6–10.4)
Chloride: 104 mmol/L (ref 98–110)
Creat: 1.17 mg/dL — ABNORMAL HIGH (ref 0.50–0.99)
Glucose, Bld: 126 mg/dL — ABNORMAL HIGH (ref 65–99)
Potassium: 4.8 mmol/L (ref 3.5–5.3)
Sodium: 140 mmol/L (ref 135–146)

## 2016-11-09 LAB — T3: T3, Total: 119 ng/dL (ref 76–181)

## 2016-11-22 ENCOUNTER — Ambulatory Visit (INDEPENDENT_AMBULATORY_CARE_PROVIDER_SITE_OTHER): Payer: Commercial Managed Care - HMO | Admitting: Orthopaedic Surgery

## 2016-12-14 ENCOUNTER — Other Ambulatory Visit: Payer: Self-pay | Admitting: Family Medicine

## 2017-01-03 ENCOUNTER — Ambulatory Visit (INDEPENDENT_AMBULATORY_CARE_PROVIDER_SITE_OTHER): Payer: Commercial Managed Care - HMO | Admitting: Orthopaedic Surgery

## 2017-01-10 ENCOUNTER — Ambulatory Visit (INDEPENDENT_AMBULATORY_CARE_PROVIDER_SITE_OTHER): Payer: Medicare Other | Admitting: Orthopaedic Surgery

## 2017-01-10 ENCOUNTER — Encounter (INDEPENDENT_AMBULATORY_CARE_PROVIDER_SITE_OTHER): Payer: Self-pay

## 2017-01-10 ENCOUNTER — Ambulatory Visit (INDEPENDENT_AMBULATORY_CARE_PROVIDER_SITE_OTHER): Payer: Medicare Other

## 2017-01-10 DIAGNOSIS — M25561 Pain in right knee: Secondary | ICD-10-CM

## 2017-01-10 DIAGNOSIS — G8929 Other chronic pain: Secondary | ICD-10-CM | POA: Diagnosis not present

## 2017-01-10 DIAGNOSIS — M1712 Unilateral primary osteoarthritis, left knee: Secondary | ICD-10-CM | POA: Diagnosis not present

## 2017-01-10 DIAGNOSIS — M25562 Pain in left knee: Secondary | ICD-10-CM | POA: Diagnosis not present

## 2017-01-10 DIAGNOSIS — M1711 Unilateral primary osteoarthritis, right knee: Secondary | ICD-10-CM | POA: Insufficient documentation

## 2017-01-10 NOTE — Progress Notes (Signed)
Office Visit Note   Patient: [name]           Date of Birth: [birth year]           MRN: [id number] Visit Date: 01/10/2017              Requested by: Girtha Rm, NP [place], [place] [address] PCP: Harland Dingwall, NP   Assessment & Plan: Visit Diagnoses:  1. Chronic pain of left knee   2. Chronic pain of right knee   3. Unilateral primary osteoarthritis, left knee   4. Unilateral primary osteoarthritis, right knee     Plan: She has severe end-stage arthritis of both her knees. We spent a long period time discussing knee replacement surgery I showed her knee models and her x-rays and explained in detail with the surgery involves. At this point it is definitely medically and clinically warranted given the detrimental effects this we will work on getting this having her surgery scheduled and we'll see her back in 2 weeks postoperative but no x-rays of be needed.  Follow-Up Instructions: Return for 2 weeks post-op.   Orders:  Orders Placed This Encounter  Procedures  . XR Knee 1-2 Views Right  . XR Knee 1-2 Views Left   No orders of the defined types were placed in this encounter.     Procedures: No procedures performed   Clinical Data: No additional findings.   Subjective: No chief complaint on file. Her chief complaint is severe bilateral knee pain. She walks with significant limp. Her pain is 10 out of 10 and is daily. It's detrimentally affected her activities daily living, her quality of life, and her mobility. This is been getting worse for 10 years now. She's tried and failed all forms conservative treatment including rest, ice, heat, anti-inflammatories, activity modification, weight loss, injections and walking with assistive device.  HPI  Review of Systems She currently denies any chest pain, headache, short of breath, fever, chills, nausea, vomiting.  Objective: Vital Signs: There were no vitals taken for this visit.  Physical  Exam She is alert and oriented 3 in no acute distress Ortho Exam Admission both her knee show mild effusion. There is mild varus deformity of both knees. They're significant patellofemoral rotation and severe joint line tenderness of both knees. Specialty Comments:  No specialty comments available.  Imaging: Xr Knee 1-2 Views Left  Result Date: 01/10/2017 An AP and lateral of her left knee shows profound end-stage arthritis. There is complete loss of the entire joint space and a varus deformity.  Xr Knee 1-2 Views Right  Result Date: 01/10/2017 An AP and lateral of the right knee show severe end-stage arthritis and degenerative joint disease. There is loss of joint space and significant varus deformity.    PMFS History: Patient Active Problem List   Diagnosis Date Noted  . Unilateral primary osteoarthritis, right knee 01/10/2017  . Unilateral primary osteoarthritis, left knee 01/10/2017  . Subclinical hypothyroidism 10/11/2016  . Decreased hearing of left ear 10/11/2016  . Hypertension   . Diabetes mellitus type 2 in obese ([place]) 12/31/2015  . Hypertriglyceridemia 08/30/2015  . Dyspnea 04/02/2013  . Cough 02/12/2013  . Abdominal pain, left lateral 04/03/2012  . Edema 08/22/2011  . Tachycardia 08/22/2011  . Diabetes ([place]) 10/16/2007  . OBESITY, MORBID 10/16/2007  . Essential hypertension 10/16/2007  . Osteoarthrosis, unspecified whether generalized or localized, involving lower leg 10/16/2007   Past Medical History:  Diagnosis Date  . Abdominal hernia   .  Arthritis    knees  . Diabetes mellitus   . Edema    legs  . HOH (hard of hearing)   . Hypertension   . Obesity     Family History  Problem Relation Age of Onset  . Hypertension Mother   . Kidney disease Mother   . Diabetes Father   . Hypertension Father   . Diabetes Sister   . Hypertension Sister   . Diabetes Brother   . Hypertension Brother   . Heart disease Brother 67    heart attack  . Hypertension  Sister     Past Surgical History:  Procedure Laterality Date  . [place]?   right  . CARPAL TUNNEL RELEASE Left 07/10/2014   Procedure: LEFT ENDOSCOPIC CARPAL TUNNEL RELEASE ;  Surgeon: Jolyn Nap, MD;  Location: [facility];  Service: Orthopedics;  Laterality: Left;  . COLONOSCOPY    . HERNIA REPAIR  07/03/2011   abd hernia  . NOSE SURGERY  2008   sinus   Social History   Occupational History  . UNEMPLOYED Unemployed   Social History Main Topics  . Smoking status: Never Smoker  . Smokeless tobacco: Never Used  . Alcohol use No  . Drug use: No  . Sexual activity: No

## 2017-01-15 ENCOUNTER — Telehealth: Payer: Self-pay

## 2017-01-15 MED ORDER — METFORMIN HCL 1000 MG PO TABS: 1000.0000 mg | ORAL_TABLET | Freq: Two times a day (BID) | ORAL | 0 refills | Status: DC

## 2017-01-15 MED ORDER — GLIMEPIRIDE 4 MG PO TABS: ORAL_TABLET | ORAL | 0 refills | Status: DC

## 2017-01-15 MED ORDER — RANITIDINE HCL 150 MG PO TABS: 150.0000 mg | ORAL_TABLET | Freq: Two times a day (BID) | ORAL | 0 refills | Status: DC

## 2017-01-15 MED ORDER — LOSARTAN POTASSIUM-HCTZ 100-12.5 MG PO TABS: 1.0000 | ORAL_TABLET | Freq: Every day | ORAL | 0 refills | Status: DC

## 2017-01-15 MED ORDER — TRAMADOL HCL 50 MG PO TABS: 50.0000 mg | ORAL_TABLET | Freq: Two times a day (BID) | ORAL | 0 refills | Status: DC

## 2017-01-15 NOTE — Telephone Encounter (Signed)
Sent in med and called in tramdol to [facility]. Called and left message for pt to call back to schedule appt

## 2017-01-15 NOTE — Telephone Encounter (Signed)
Pt needs refill of glimipiride, losartan/HCTZ, metformin, rantinidine, and tramadol to [facility]

## 2017-01-15 NOTE — Telephone Encounter (Signed)
Ok to refill medications for 3 more months and then she will need a visit for diabetes, med check and fasting cholesterol.

## 2017-01-25 ENCOUNTER — Other Ambulatory Visit (INDEPENDENT_AMBULATORY_CARE_PROVIDER_SITE_OTHER): Payer: Self-pay | Admitting: Physician Assistant

## 2017-01-29 NOTE — Pre-Procedure Instructions (Addendum)
[place]  01/29/2017      [place], [place] - [address] [place] [address] Phone: [phone] Fax: [phone]  [facility] [place], [place] [address] Phone: [phone] Fax: [phone]  [place], [place] [place] [address] Phone: [phone] Fax: [phone]  [facility] [place], [address] [place] [address] Phone: [phone] Fax: [phone]    Your procedure is scheduled on February 20  Report to [facility] at 1300 P.M.  Call this number if you have problems the morning of surgery:  [phone]   Remember:  Do not eat food or drink liquids after midnight.   Take these medicines the morning of surgery with A SIP OF WATER ranitidine (ZANTAC), traMADol (ULTRAM) if needed  7 days prior to surgery STOP taking any Aspirin, Aleve, Naproxen, Ibuprofen, Motrin, Advil, Goody's, BC's, all herbal medications, fish oil, and all vitamins   WHAT DO I DO ABOUT MY DIABETES MEDICATION?   Marland Kitchen Do not take oral diabetes medicines (pills) the morning of surgery. glimepiride (AMARYL) or metFORMIN (GLUCOPHAGE)   How to Manage Your Diabetes Before and After Surgery  Why is it important to control my blood sugar before and after surgery? . Improving blood sugar levels before and after surgery helps healing and can limit problems. . A way of improving blood sugar control is eating a healthy diet by: o  Eating less sugar and carbohydrates o  Increasing activity/exercise o  Talking with your doctor about reaching your blood sugar goals . High blood sugars (greater than 180 mg/dL) can raise your risk of infections and slow your recovery, so you  will need to focus on controlling your diabetes during the weeks before surgery. . Make sure that the doctor who takes care of your diabetes knows about your planned surgery including the date and location.  How do I manage my blood sugar before surgery? . Check your blood sugar at least 4 times a day, starting 2 days before surgery, to make sure that the level is not too high or low. o Check your blood sugar the morning of your surgery when you wake up and every 2 hours until you get to the Short Stay unit. . If your blood sugar is less than 70 mg/dL, you will need to treat for low blood sugar: o Do not take insulin. o Treat a low blood sugar (less than 70 mg/dL) with  cup of clear juice (cranberry or apple), 4 glucose tablets, OR glucose gel. o Recheck blood sugar in 15 minutes after treatment (to make sure it is greater than 70 mg/dL). If your blood sugar is not greater than 70 mg/dL on recheck, call [phone] for further instructions. . Report your blood sugar to the short stay nurse when you get to Short Stay.  . If you are admitted to the hospital after surgery: o Your blood sugar will be checked by the staff and you will probably be given insulin after surgery (instead of oral diabetes medicines) to make sure you have good blood sugar levels. o The goal for blood sugar control after surgery is 80-180 mg/dL.    Do not wear jewelry, make-up or nail  polish.  Do not wear lotions, powders, or perfumes, or deoderant.  Do not shave 48 hours prior to surgery.    Do not bring valuables to the hospital.  [facility] is not responsible for any belongings or valuables.  Contacts, dentures or bridgework may not be worn into surgery.  Leave your suitcase in the car.  After surgery it may be brought to your room.  For patients admitted to the hospital, discharge time will be determined by your treatment team.  Patients discharged the day of surgery will not be allowed to drive home.    Special instructions:   - Preparing For Surgery  Before surgery, you can play an important role. Because skin is not sterile, your skin needs to be as free of germs as possible. You can reduce the number of germs on your skin by washing with CHG (chlorahexidine gluconate) Soap before surgery.  CHG is an antiseptic cleaner which kills germs and bonds with the skin to continue killing germs even after washing.  Please do not use if you have an allergy to CHG or antibacterial soaps. If your skin becomes reddened/irritated stop using the CHG.  Do not shave (including legs and underarms) for at least 48 hours prior to first CHG shower. It is OK to shave your face.  Please follow these instructions carefully.   1. Shower the NIGHT BEFORE SURGERY and the MORNING OF SURGERY with CHG.   2. If you chose to wash your hair, wash your hair first as usual with your normal shampoo.  3. After you shampoo, rinse your hair and body thoroughly to remove the shampoo.  4. Use CHG as you would any other liquid soap. You can apply CHG directly to the skin and wash gently with a scrungie or a clean washcloth.   5. Apply the CHG Soap to your body ONLY FROM THE NECK DOWN.  Do not use on open wounds or open sores. Avoid contact with your eyes, ears, mouth and genitals (private parts). Wash genitals (private parts) with your normal soap.  6. Wash thoroughly, paying special attention to the area where your surgery will be performed.  7. Thoroughly rinse your body with warm water from the neck down.  8. DO NOT shower/wash with your normal soap after using and rinsing off the CHG Soap.  9. Pat yourself dry with a CLEAN TOWEL.   10. Wear CLEAN PAJAMAS   11. Place CLEAN SHEETS on your bed the night of your first shower and DO NOT SLEEP WITH PETS.    Day of Surgery: Do not apply any deodorants/lotions. Please wear clean clothes to the hospital/surgery center.      Please read over the following  fact sheets that you were given.

## 2017-01-30 ENCOUNTER — Encounter (HOSPITAL_COMMUNITY): Payer: Self-pay

## 2017-01-30 ENCOUNTER — Encounter (HOSPITAL_COMMUNITY)
Admission: RE | Admit: 2017-01-30 | Discharge: 2017-01-30 | Disposition: A | Discharge status: Home / Self Care | Payer: Medicare Other | Source: Ambulatory Visit | Attending: Orthopaedic Surgery | Admitting: Orthopaedic Surgery

## 2017-01-30 DIAGNOSIS — M1712 Unilateral primary osteoarthritis, left knee: Secondary | ICD-10-CM | POA: Diagnosis not present

## 2017-01-30 DIAGNOSIS — Z01812 Encounter for preprocedural laboratory examination: Secondary | ICD-10-CM | POA: Diagnosis present

## 2017-01-30 DIAGNOSIS — R000 Tachycardia, unspecified: Secondary | ICD-10-CM | POA: Diagnosis not present

## 2017-01-30 DIAGNOSIS — Z01810 Encounter for preprocedural cardiovascular examination: Secondary | ICD-10-CM | POA: Diagnosis not present

## 2017-01-30 HISTORY — DX: Gastro-esophageal reflux disease without esophagitis: K21.9

## 2017-01-30 LAB — CBC
HCT: 36.7 % (ref 36.0–46.0)
Hemoglobin: 11.3 g/dL — ABNORMAL LOW (ref 12.0–15.0)
MCH: 29 pg (ref 26.0–34.0)
MCHC: 30.8 g/dL (ref 30.0–36.0)
MCV: 94.3 fL (ref 78.0–100.0)
Platelets: 334 10*3/uL (ref 150–400)
RBC: 3.89 MIL/uL (ref 3.87–5.11)
RDW: 14.1 % (ref 11.5–15.5)
WBC: 7.7 10*3/uL (ref 4.0–10.5)

## 2017-01-30 LAB — BASIC METABOLIC PANEL
Anion gap: 11 (ref 5–15)
BUN: 22 mg/dL — ABNORMAL HIGH (ref 6–20)
CO2: 22 mmol/L (ref 22–32)
Calcium: 9.5 mg/dL (ref 8.9–10.3)
Chloride: 106 mmol/L (ref 101–111)
Creatinine, Ser: 1.16 mg/dL — ABNORMAL HIGH (ref 0.44–1.00)
GFR calc Af Amer: 57 mL/min — ABNORMAL LOW (ref >60)
GFR calc non Af Amer: 49 mL/min — ABNORMAL LOW (ref >60)
Glucose, Bld: 161 mg/dL — ABNORMAL HIGH (ref 65–99)
Potassium: 4.6 mmol/L (ref 3.5–5.1)
Sodium: 139 mmol/L (ref 135–145)

## 2017-01-30 LAB — SURGICAL PCR SCREEN
MRSA, PCR: NEGATIVE
Staphylococcus aureus: NEGATIVE

## 2017-01-30 LAB — NO BLOOD PRODUCTS

## 2017-01-30 LAB — GLUCOSE, CAPILLARY: Glucose-Capillary: 178 mg/dL — ABNORMAL HIGH (ref 65–99)

## 2017-01-30 NOTE — Progress Notes (Signed)
Pt. Denies chest concerns. Pt. Reports that Dr. Redmond School follows her for PCP, reports that she was seen last in 09/2016.  Pt. Is Jehovah Witness, refuses blood, signed refusal, pt. Will bring legal document DOS for refusal. Pt. Denies recent cardiac studies, last ECHO- 2012

## 2017-01-31 LAB — HEMOGLOBIN A1C
Hgb A1c MFr Bld: 6.6 % — ABNORMAL HIGH (ref 4.8–5.6)
Mean Plasma Glucose: 143 mg/dL

## 2017-02-05 ENCOUNTER — Telehealth (INDEPENDENT_AMBULATORY_CARE_PROVIDER_SITE_OTHER): Payer: Self-pay | Admitting: Orthopaedic Surgery

## 2017-02-05 MED ORDER — TRANEXAMIC ACID 1000 MG/10ML IV SOLN
1000.0000 mg | INTRAVENOUS | Status: AC
  Administered 2017-02-06: 1000 mg via INTRAVENOUS
  Filled 2017-02-05: qty 10

## 2017-02-05 MED ORDER — DEXTROSE 5 % IV SOLN
3.0000 g | INTRAVENOUS | Status: AC
  Administered 2017-02-06: 3 g via INTRAVENOUS
  Filled 2017-02-05 (×2): qty 3000

## 2017-02-05 NOTE — Telephone Encounter (Signed)
See below

## 2017-02-05 NOTE — Telephone Encounter (Signed)
Patient is requesting a referral to a rehabilitation facility after surgery TOMORROW !  She wants to go to [facility], says she has [employer]. Patient cb#: [phone]

## 2017-02-06 ENCOUNTER — Ambulatory Visit (HOSPITAL_COMMUNITY): Payer: Medicare Other | Admitting: Vascular Surgery

## 2017-02-06 ENCOUNTER — Encounter (HOSPITAL_COMMUNITY)
Admission: RE | Disposition: A | Discharge status: Skilled Nursing Facility | Payer: Self-pay | Source: Ambulatory Visit | Attending: Orthopaedic Surgery

## 2017-02-06 ENCOUNTER — Inpatient Hospital Stay (HOSPITAL_COMMUNITY)
Admission: RE | Admit: 2017-02-06 | Discharge: 2017-02-09 | DRG: 470 | Disposition: A | Discharge status: Skilled Nursing Facility | Payer: Medicare Other | Source: Ambulatory Visit | Attending: Orthopaedic Surgery | Admitting: Orthopaedic Surgery

## 2017-02-06 ENCOUNTER — Ambulatory Visit (HOSPITAL_COMMUNITY): Payer: Medicare Other | Admitting: Certified Registered Nurse Anesthetist

## 2017-02-06 ENCOUNTER — Inpatient Hospital Stay (HOSPITAL_COMMUNITY): Payer: Medicare Other

## 2017-02-06 ENCOUNTER — Encounter (HOSPITAL_COMMUNITY): Payer: Self-pay | Admitting: Certified Registered Nurse Anesthetist

## 2017-02-06 DIAGNOSIS — R488 Other symbolic dysfunctions: Secondary | ICD-10-CM | POA: Diagnosis not present

## 2017-02-06 DIAGNOSIS — Z791 Long term (current) use of non-steroidal anti-inflammatories (NSAID): Secondary | ICD-10-CM | POA: Diagnosis not present

## 2017-02-06 DIAGNOSIS — K219 Gastro-esophageal reflux disease without esophagitis: Secondary | ICD-10-CM | POA: Diagnosis not present

## 2017-02-06 DIAGNOSIS — I10 Essential (primary) hypertension: Secondary | ICD-10-CM | POA: Diagnosis not present

## 2017-02-06 DIAGNOSIS — M21162 Varus deformity, not elsewhere classified, left knee: Secondary | ICD-10-CM | POA: Diagnosis present

## 2017-02-06 DIAGNOSIS — M2540 Effusion, unspecified joint: Secondary | ICD-10-CM | POA: Diagnosis not present

## 2017-02-06 DIAGNOSIS — R600 Localized edema: Secondary | ICD-10-CM | POA: Diagnosis not present

## 2017-02-06 DIAGNOSIS — R262 Difficulty in walking, not elsewhere classified: Secondary | ICD-10-CM | POA: Diagnosis not present

## 2017-02-06 DIAGNOSIS — M6281 Muscle weakness (generalized): Secondary | ICD-10-CM | POA: Diagnosis not present

## 2017-02-06 DIAGNOSIS — Z6841 Body Mass Index (BMI) 40.0 and over, adult: Secondary | ICD-10-CM

## 2017-02-06 DIAGNOSIS — M25569 Pain in unspecified knee: Secondary | ICD-10-CM | POA: Diagnosis not present

## 2017-02-06 DIAGNOSIS — R05 Cough: Secondary | ICD-10-CM | POA: Diagnosis not present

## 2017-02-06 DIAGNOSIS — R000 Tachycardia, unspecified: Secondary | ICD-10-CM | POA: Diagnosis not present

## 2017-02-06 DIAGNOSIS — Z79899 Other long term (current) drug therapy: Secondary | ICD-10-CM | POA: Diagnosis not present

## 2017-02-06 DIAGNOSIS — H9192 Unspecified hearing loss, left ear: Secondary | ICD-10-CM | POA: Diagnosis not present

## 2017-02-06 DIAGNOSIS — Z96652 Presence of left artificial knee joint: Secondary | ICD-10-CM

## 2017-02-06 DIAGNOSIS — E119 Type 2 diabetes mellitus without complications: Secondary | ICD-10-CM | POA: Diagnosis present

## 2017-02-06 DIAGNOSIS — D62 Acute posthemorrhagic anemia: Secondary | ICD-10-CM | POA: Diagnosis not present

## 2017-02-06 DIAGNOSIS — M1990 Unspecified osteoarthritis, unspecified site: Secondary | ICD-10-CM | POA: Diagnosis not present

## 2017-02-06 DIAGNOSIS — Z882 Allergy status to sulfonamides status: Secondary | ICD-10-CM

## 2017-02-06 DIAGNOSIS — G8918 Other acute postprocedural pain: Secondary | ICD-10-CM | POA: Diagnosis not present

## 2017-02-06 DIAGNOSIS — M1712 Unilateral primary osteoarthritis, left knee: Secondary | ICD-10-CM

## 2017-02-06 DIAGNOSIS — M1711 Unilateral primary osteoarthritis, right knee: Secondary | ICD-10-CM | POA: Diagnosis present

## 2017-02-06 DIAGNOSIS — Z7984 Long term (current) use of oral hypoglycemic drugs: Secondary | ICD-10-CM

## 2017-02-06 DIAGNOSIS — R531 Weakness: Secondary | ICD-10-CM | POA: Diagnosis not present

## 2017-02-06 DIAGNOSIS — Z471 Aftercare following joint replacement surgery: Secondary | ICD-10-CM | POA: Diagnosis not present

## 2017-02-06 HISTORY — DX: Presence of left artificial knee joint: Z96.652

## 2017-02-06 HISTORY — PX: TOTAL KNEE ARTHROPLASTY: SHX125

## 2017-02-06 LAB — GLUCOSE, CAPILLARY
Glucose-Capillary: 132 mg/dL — ABNORMAL HIGH (ref 65–99)
Glucose-Capillary: 181 mg/dL — ABNORMAL HIGH (ref 65–99)
Glucose-Capillary: 191 mg/dL — ABNORMAL HIGH (ref 65–99)

## 2017-02-06 SURGERY — ARTHROPLASTY, KNEE, TOTAL
Anesthesia: Regional | Site: Knee | Laterality: Left

## 2017-02-06 MED ORDER — RIVAROXABAN 10 MG PO TABS
10.0000 mg | ORAL_TABLET | Freq: Every day | ORAL | Status: DC
  Administered 2017-02-07 – 2017-02-09 (×3): 10 mg via ORAL
  Filled 2017-02-06 (×3): qty 1

## 2017-02-06 MED ORDER — LOSARTAN POTASSIUM-HCTZ 100-12.5 MG PO TABS: 1.0000 | ORAL_TABLET | Freq: Every day | ORAL | Status: DC

## 2017-02-06 MED ORDER — ROCURONIUM BROMIDE 100 MG/10ML IV SOLN
INTRAVENOUS | Status: DC | PRN
  Administered 2017-02-06: 50 mg via INTRAVENOUS

## 2017-02-06 MED ORDER — FENTANYL CITRATE (PF) 100 MCG/2ML IJ SOLN
INTRAMUSCULAR | Status: AC
  Filled 2017-02-06: qty 2

## 2017-02-06 MED ORDER — MIDAZOLAM HCL 2 MG/2ML IJ SOLN
INTRAMUSCULAR | Status: AC
  Administered 2017-02-06: 1 mg via INTRAVENOUS
  Filled 2017-02-06: qty 2

## 2017-02-06 MED ORDER — BUPIVACAINE LIPOSOME 1.3 % IJ SUSP
20.0000 mL | INTRAMUSCULAR | Status: DC
  Filled 2017-02-06: qty 20

## 2017-02-06 MED ORDER — FENTANYL CITRATE (PF) 100 MCG/2ML IJ SOLN
INTRAMUSCULAR | Status: DC | PRN
  Administered 2017-02-06 (×3): 25 ug via INTRAVENOUS
  Administered 2017-02-06: 50 ug via INTRAVENOUS
  Administered 2017-02-06 (×3): 25 ug via INTRAVENOUS

## 2017-02-06 MED ORDER — PHENOL 1.4 % MT LIQD: 1.0000 | OROMUCOSAL | Status: DC | PRN

## 2017-02-06 MED ORDER — METOCLOPRAMIDE HCL 5 MG PO TABS: 5.0000 mg | ORAL_TABLET | Freq: Three times a day (TID) | ORAL | Status: DC | PRN

## 2017-02-06 MED ORDER — FAMOTIDINE 20 MG PO TABS
20.0000 mg | ORAL_TABLET | Freq: Two times a day (BID) | ORAL | Status: DC
  Administered 2017-02-06 – 2017-02-09 (×6): 20 mg via ORAL
  Filled 2017-02-06 (×6): qty 1

## 2017-02-06 MED ORDER — CHLORHEXIDINE GLUCONATE 4 % EX LIQD: 60.0000 mL | Freq: Once | CUTANEOUS | Status: DC

## 2017-02-06 MED ORDER — VITAMIN D 1000 UNITS PO TABS
1000.0000 [IU] | ORAL_TABLET | Freq: Every day | ORAL | Status: DC
  Administered 2017-02-07 – 2017-02-09 (×3): 1000 [IU] via ORAL
  Filled 2017-02-06 (×3): qty 1

## 2017-02-06 MED ORDER — 0.9 % SODIUM CHLORIDE (POUR BTL) OPTIME
TOPICAL | Status: DC | PRN
  Administered 2017-02-06: 1000 mL

## 2017-02-06 MED ORDER — BUPIVACAINE HCL (PF) 0.25 % IJ SOLN
INTRAMUSCULAR | Status: AC
  Filled 2017-02-06: qty 30

## 2017-02-06 MED ORDER — FENTANYL CITRATE (PF) 100 MCG/2ML IJ SOLN
INTRAMUSCULAR | Status: AC
  Administered 2017-02-06: 100 ug via INTRAVENOUS
  Filled 2017-02-06: qty 2

## 2017-02-06 MED ORDER — HYDROMORPHONE HCL 1 MG/ML IJ SOLN
0.2500 mg | INTRAMUSCULAR | Status: DC | PRN
  Administered 2017-02-06 (×2): 0.5 mg via INTRAVENOUS
  Administered 2017-02-06: 0.25 mg via INTRAVENOUS

## 2017-02-06 MED ORDER — LOSARTAN POTASSIUM 50 MG PO TABS
100.0000 mg | ORAL_TABLET | Freq: Every day | ORAL | Status: DC
  Administered 2017-02-06 – 2017-02-08 (×3): 100 mg via ORAL
  Filled 2017-02-06 (×4): qty 2

## 2017-02-06 MED ORDER — MENTHOL 3 MG MT LOZG: 1.0000 | LOZENGE | OROMUCOSAL | Status: DC | PRN

## 2017-02-06 MED ORDER — METFORMIN HCL 500 MG PO TABS
1000.0000 mg | ORAL_TABLET | Freq: Two times a day (BID) | ORAL | Status: DC
  Administered 2017-02-07 – 2017-02-09 (×5): 1000 mg via ORAL
  Filled 2017-02-06 (×5): qty 2

## 2017-02-06 MED ORDER — BUPIVACAINE-EPINEPHRINE (PF) 0.5% -1:200000 IJ SOLN
INTRAMUSCULAR | Status: DC | PRN
  Administered 2017-02-06: 30 mL via PERINEURAL

## 2017-02-06 MED ORDER — OXYCODONE HCL 5 MG PO TABS
5.0000 mg | ORAL_TABLET | ORAL | Status: DC | PRN
  Administered 2017-02-06 – 2017-02-09 (×12): 10 mg via ORAL
  Filled 2017-02-06 (×14): qty 2

## 2017-02-06 MED ORDER — HYDROMORPHONE HCL 1 MG/ML IJ SOLN
INTRAMUSCULAR | Status: AC
Start: 2017-02-06 — End: 2017-02-07
  Filled 2017-02-06: qty 1.5

## 2017-02-06 MED ORDER — ONDANSETRON HCL 4 MG PO TABS: 4.0000 mg | ORAL_TABLET | Freq: Four times a day (QID) | ORAL | Status: DC | PRN

## 2017-02-06 MED ORDER — SODIUM CHLORIDE 0.9 % IR SOLN
Status: DC | PRN
  Administered 2017-02-06: 3000 mL

## 2017-02-06 MED ORDER — SODIUM CHLORIDE 0.9 % IV SOLN
INTRAVENOUS | Status: DC
  Administered 2017-02-07: 01:00:00 via INTRAVENOUS

## 2017-02-06 MED ORDER — CEFAZOLIN SODIUM-DEXTROSE 2-4 GM/100ML-% IV SOLN
2.0000 g | Freq: Four times a day (QID) | INTRAVENOUS | Status: AC
  Administered 2017-02-07: 2 g via INTRAVENOUS
  Filled 2017-02-06 (×2): qty 100

## 2017-02-06 MED ORDER — DOCUSATE SODIUM 100 MG PO CAPS
100.0000 mg | ORAL_CAPSULE | Freq: Two times a day (BID) | ORAL | Status: DC
  Administered 2017-02-06 – 2017-02-09 (×6): 100 mg via ORAL
  Filled 2017-02-06 (×6): qty 1

## 2017-02-06 MED ORDER — ACETAMINOPHEN 650 MG RE SUPP: 650.0000 mg | Freq: Four times a day (QID) | RECTAL | Status: DC | PRN

## 2017-02-06 MED ORDER — DIPHENHYDRAMINE HCL 12.5 MG/5ML PO ELIX: 12.5000 mg | ORAL_SOLUTION | ORAL | Status: DC | PRN

## 2017-02-06 MED ORDER — ACETAMINOPHEN 325 MG PO TABS
650.0000 mg | ORAL_TABLET | Freq: Four times a day (QID) | ORAL | Status: DC | PRN
  Filled 2017-02-06 (×2): qty 2

## 2017-02-06 MED ORDER — OXYCODONE HCL 5 MG/5ML PO SOLN: 5.0000 mg | Freq: Once | ORAL | Status: AC | PRN

## 2017-02-06 MED ORDER — LACTATED RINGERS IV SOLN
INTRAVENOUS | Status: DC
  Administered 2017-02-06 (×2): via INTRAVENOUS

## 2017-02-06 MED ORDER — METHOCARBAMOL 500 MG PO TABS
ORAL_TABLET | ORAL | Status: AC
  Filled 2017-02-06: qty 1

## 2017-02-06 MED ORDER — HYDROCHLOROTHIAZIDE 12.5 MG PO CAPS
12.5000 mg | ORAL_CAPSULE | Freq: Every day | ORAL | Status: DC
  Administered 2017-02-07: 12.5 mg via ORAL
  Filled 2017-02-06 (×4): qty 1

## 2017-02-06 MED ORDER — METOCLOPRAMIDE HCL 5 MG/ML IJ SOLN: 5.0000 mg | Freq: Three times a day (TID) | INTRAMUSCULAR | Status: DC | PRN

## 2017-02-06 MED ORDER — ALUM & MAG HYDROXIDE-SIMETH 200-200-20 MG/5ML PO SUSP: 30.0000 mL | ORAL | Status: DC | PRN

## 2017-02-06 MED ORDER — KETOROLAC TROMETHAMINE 15 MG/ML IJ SOLN
INTRAMUSCULAR | Status: AC
  Filled 2017-02-06: qty 1

## 2017-02-06 MED ORDER — OXYCODONE HCL 5 MG PO TABS
5.0000 mg | ORAL_TABLET | Freq: Once | ORAL | Status: AC | PRN
  Administered 2017-02-06: 5 mg via ORAL

## 2017-02-06 MED ORDER — METHOCARBAMOL 500 MG PO TABS
500.0000 mg | ORAL_TABLET | Freq: Four times a day (QID) | ORAL | Status: DC | PRN
  Administered 2017-02-06 – 2017-02-08 (×3): 500 mg via ORAL
  Filled 2017-02-06 (×5): qty 1

## 2017-02-06 MED ORDER — PHENYLEPHRINE HCL 10 MG/ML IJ SOLN
INTRAMUSCULAR | Status: DC | PRN
  Administered 2017-02-06: 160 ug via INTRAVENOUS
  Administered 2017-02-06: 80 ug via INTRAVENOUS
  Administered 2017-02-06 (×4): 120 ug via INTRAVENOUS

## 2017-02-06 MED ORDER — PROPOFOL 10 MG/ML IV BOLUS
INTRAVENOUS | Status: DC | PRN
  Administered 2017-02-06: 200 mg via INTRAVENOUS
  Administered 2017-02-06 (×2): 50 mg via INTRAVENOUS

## 2017-02-06 MED ORDER — MIDAZOLAM HCL 2 MG/2ML IJ SOLN
1.0000 mg | Freq: Once | INTRAMUSCULAR | Status: AC
  Administered 2017-02-06: 1 mg via INTRAVENOUS

## 2017-02-06 MED ORDER — BUPIVACAINE HCL 0.25 % IJ SOLN
INTRAMUSCULAR | Status: DC | PRN
  Administered 2017-02-06: 20 mL

## 2017-02-06 MED ORDER — ONDANSETRON HCL 4 MG/2ML IJ SOLN: 4.0000 mg | Freq: Four times a day (QID) | INTRAMUSCULAR | Status: DC | PRN

## 2017-02-06 MED ORDER — SODIUM CHLORIDE 0.9 % IV SOLN
INTRAVENOUS | Status: DC | PRN
  Administered 2017-02-06: 16:00:00

## 2017-02-06 MED ORDER — METHOCARBAMOL 1000 MG/10ML IJ SOLN
500.0000 mg | Freq: Four times a day (QID) | INTRAVENOUS | Status: DC | PRN
  Filled 2017-02-06: qty 5

## 2017-02-06 MED ORDER — SODIUM CHLORIDE 0.9 % IJ SOLN
INTRAMUSCULAR | Status: DC | PRN
  Administered 2017-02-06: 10 mL

## 2017-02-06 MED ORDER — POLYETHYLENE GLYCOL 3350 17 G PO PACK: 17.0000 g | PACK | Freq: Every day | ORAL | Status: DC | PRN

## 2017-02-06 MED ORDER — GLIMEPIRIDE 4 MG PO TABS
4.0000 mg | ORAL_TABLET | Freq: Every day | ORAL | Status: DC
  Administered 2017-02-07 – 2017-02-09 (×3): 4 mg via ORAL
  Filled 2017-02-06 (×3): qty 1

## 2017-02-06 MED ORDER — ZOLPIDEM TARTRATE 5 MG PO TABS: 5.0000 mg | ORAL_TABLET | Freq: Every evening | ORAL | Status: DC | PRN

## 2017-02-06 MED ORDER — ADULT MULTIVITAMIN W/MINERALS CH
1.0000 | ORAL_TABLET | Freq: Every day | ORAL | Status: DC
  Administered 2017-02-07 – 2017-02-09 (×3): 1 via ORAL
  Filled 2017-02-06 (×3): qty 1

## 2017-02-06 MED ORDER — ONDANSETRON HCL 4 MG/2ML IJ SOLN: 4.0000 mg | Freq: Once | INTRAMUSCULAR | Status: DC | PRN

## 2017-02-06 MED ORDER — EPINEPHRINE PF 1 MG/ML IJ SOLN
INTRAMUSCULAR | Status: AC
  Filled 2017-02-06: qty 1

## 2017-02-06 MED ORDER — FENTANYL CITRATE (PF) 100 MCG/2ML IJ SOLN
100.0000 ug | Freq: Once | INTRAMUSCULAR | Status: AC
  Administered 2017-02-06: 100 ug via INTRAVENOUS

## 2017-02-06 MED ORDER — LIDOCAINE HCL (CARDIAC) 20 MG/ML IV SOLN
INTRAVENOUS | Status: DC | PRN
  Administered 2017-02-06: 100 mg via INTRAVENOUS

## 2017-02-06 MED ORDER — KETOROLAC TROMETHAMINE 15 MG/ML IJ SOLN
7.5000 mg | Freq: Four times a day (QID) | INTRAMUSCULAR | Status: AC
  Administered 2017-02-06 – 2017-02-07 (×4): 7.5 mg via INTRAVENOUS
  Filled 2017-02-06 (×3): qty 1

## 2017-02-06 MED ORDER — OXYCODONE HCL 5 MG PO TABS
ORAL_TABLET | ORAL | Status: AC
Start: 2017-02-06 — End: 2017-02-07
  Filled 2017-02-06: qty 1

## 2017-02-06 MED ORDER — HYDROMORPHONE HCL 2 MG/ML IJ SOLN
1.0000 mg | INTRAMUSCULAR | Status: DC | PRN
  Filled 2017-02-06 (×2): qty 1

## 2017-02-06 SURGICAL SUPPLY — 58 items
BANDAGE ACE 6X5 VEL STRL LF (GAUZE/BANDAGES/DRESSINGS) ×4 IMPLANT
BANDAGE ESMARK 6X9 LF (GAUZE/BANDAGES/DRESSINGS) ×1 IMPLANT
BLADE SAG 18X100X1.27 (BLADE) ×2 IMPLANT
BNDG ESMARK 6X9 LF (GAUZE/BANDAGES/DRESSINGS) ×2
BOWL SMART MIX CTS (DISPOSABLE) ×2 IMPLANT
CAPT KNEE TOTAL 3 ×2 IMPLANT
CEMENT BONE SIMPLEX SPEEDSET (Cement) ×4 IMPLANT
COVER SURGICAL LIGHT HANDLE (MISCELLANEOUS) ×2 IMPLANT
CUFF TOURNIQUET SINGLE 34IN LL (TOURNIQUET CUFF) IMPLANT
CUFF TOURNIQUET SINGLE 44IN (TOURNIQUET CUFF) ×2 IMPLANT
DRAPE EXTREMITY T 121X128X90 (DRAPE) ×2 IMPLANT
DRAPE PROXIMA HALF (DRAPES) ×2 IMPLANT
DRAPE U-SHAPE 47X51 STRL (DRAPES) ×2 IMPLANT
DRSG PAD ABDOMINAL 8X10 ST (GAUZE/BANDAGES/DRESSINGS) ×2 IMPLANT
DURAPREP 26ML APPLICATOR (WOUND CARE) ×2 IMPLANT
ELECT CAUTERY BLADE 6.4 (BLADE) ×2 IMPLANT
ELECT REM PT RETURN 9FT ADLT (ELECTROSURGICAL) ×2
ELECTRODE REM PT RTRN 9FT ADLT (ELECTROSURGICAL) ×1 IMPLANT
FACESHIELD WRAPAROUND (MASK) ×4 IMPLANT
GAUZE SPONGE 4X4 12PLY STRL (GAUZE/BANDAGES/DRESSINGS) ×2 IMPLANT
GAUZE XEROFORM 1X8 LF (GAUZE/BANDAGES/DRESSINGS) ×2 IMPLANT
GLOVE BIOGEL PI IND STRL 8 (GLOVE) ×2 IMPLANT
GLOVE BIOGEL PI INDICATOR 8 (GLOVE) ×2
GLOVE ORTHO TXT STRL SZ7.5 (GLOVE) ×2 IMPLANT
GLOVE SURG ORTHO 8.0 STRL STRW (GLOVE) ×2 IMPLANT
GOWN STRL REUS W/ TWL LRG LVL3 (GOWN DISPOSABLE) IMPLANT
GOWN STRL REUS W/ TWL XL LVL3 (GOWN DISPOSABLE) ×2 IMPLANT
GOWN STRL REUS W/TWL LRG LVL3 (GOWN DISPOSABLE)
GOWN STRL REUS W/TWL XL LVL3 (GOWN DISPOSABLE) ×2
HANDPIECE INTERPULSE COAX TIP (DISPOSABLE) ×1
IMMOBILIZER KNEE 22 UNIV (SOFTGOODS) ×2 IMPLANT
KIT BASIN OR (CUSTOM PROCEDURE TRAY) ×2 IMPLANT
KIT ROOM TURNOVER OR (KITS) ×2 IMPLANT
MANIFOLD NEPTUNE II (INSTRUMENTS) ×2 IMPLANT
NDL SAFETY ECLIPSE 18X1.5 (NEEDLE) IMPLANT
NEEDLE HYPO 18GX1.5 SHARP (NEEDLE)
NS IRRIG 1000ML POUR BTL (IV SOLUTION) ×2 IMPLANT
PACK TOTAL JOINT (CUSTOM PROCEDURE TRAY) ×2 IMPLANT
PAD ARMBOARD 7.5X6 YLW CONV (MISCELLANEOUS) ×2 IMPLANT
PADDING CAST COTTON 6X4 STRL (CAST SUPPLIES) ×2 IMPLANT
SET HNDPC FAN SPRY TIP SCT (DISPOSABLE) ×1 IMPLANT
SET PAD KNEE POSITIONER (MISCELLANEOUS) ×2 IMPLANT
STAPLER VISISTAT 35W (STAPLE) IMPLANT
STRIP CLOSURE SKIN 1/2X4 (GAUZE/BANDAGES/DRESSINGS) IMPLANT
SUCTION FRAZIER HANDLE 10FR (MISCELLANEOUS) ×1
SUCTION TUBE FRAZIER 10FR DISP (MISCELLANEOUS) ×1 IMPLANT
SUT MNCRL AB 4-0 PS2 18 (SUTURE) IMPLANT
SUT VIC AB 0 CT1 27 (SUTURE) ×1
SUT VIC AB 0 CT1 27XBRD ANBCTR (SUTURE) ×1 IMPLANT
SUT VIC AB 1 CT1 27 (SUTURE) ×2
SUT VIC AB 1 CT1 27XBRD ANBCTR (SUTURE) ×2 IMPLANT
SUT VIC AB 2-0 CT1 27 (SUTURE) ×2
SUT VIC AB 2-0 CT1 TAPERPNT 27 (SUTURE) ×2 IMPLANT
SYR 50ML LL SCALE MARK (SYRINGE) IMPLANT
TOWEL OR 17X24 6PK STRL BLUE (TOWEL DISPOSABLE) ×2 IMPLANT
TOWEL OR 17X26 10 PK STRL BLUE (TOWEL DISPOSABLE) ×2 IMPLANT
TRAY CATH 16FR W/PLASTIC CATH (SET/KITS/TRAYS/PACK) IMPLANT
WRAP KNEE MAXI GEL POST OP (GAUZE/BANDAGES/DRESSINGS) ×2 IMPLANT

## 2017-02-06 NOTE — Op Note (Signed)
[name], [name]                ACCOUNT NO.:  [id number]  MEDICAL RECORD NO.:  [id number]  LOCATION:                               FACILITY:  [facility]  PHYSICIAN:  Lind Guest. Ninfa Linden, M.D.DATE OF BIRTH:  08-15-1951  DATE OF PROCEDURE:  02/06/2017 DATE OF DISCHARGE:                              OPERATIVE REPORT   PREOPERATIVE DIAGNOSIS:  Primary osteoarthritis and degenerative joint disease of the left knee.  POSTOPERATIVE DIAGNOSIS:  Primary osteoarthritis and degenerative joint disease of the left knee.  PROCEDURE:  Left total knee arthroplasty.  IMPLANTS:  Stryker Triathlon knee with size 3 femur, size 3 tibial tray, 11 mm fix-bearing polyethylene insert, size 29 patellar button.  SURGEON:  Lind Guest. Ninfa Linden, M.D.  ASSISTANT:  Erskine Emery, PA-C.  ANESTHESIA: 1. Left lower extremity adductor canal block. 2. Attempted spinal anesthesia. 3. General anesthesia. 4. Local infiltration of the joint with a combination of Exparel and     plain Marcaine.  ANTIBIOTICS:  3 g of IV Ancef.  TOURNIQUET TIME:  Just over 1 hour.  COMPLICATIONS:  None.  INDICATIONS:  [name] is a 66 year old patient well known to me.  She has bilateral knees severe osteoarthritis degenerative joint disease. She has tried and failed all forms of conservative treatment.  At this point, she does wish to proceed with a total knee replacement.  Her pain is daily and has detrimentally affected her activities of daily living, her quality of life, and her mobility.  She understands the risk of acute blood loss anemia, nerve and vessel injury, fracture, infection, and DVT.  She understands our goals are to decrease pain, improve mobility and overall improved quality of life.  PROCEDURE DESCRIPTION:  After informed consent was obtained, appropriate left leg was marked.  Anesthesia obtained through an adductor canal block.  She was then brought to the operating room and sat up on  the operating table, and spinal anesthesia was attempted.  This was unsuccessful.  She was laid back supine on the operating table.  General anesthesia was then obtained.  A Foley catheter was placed and a nonsterile tourniquet was placed on her upper left thigh.  The left leg was then prepped and draped from the thigh down the toes with DuraPrep and sterile drapes.  Time-out was called and she was identified as correct patient and correct left knee.  We then used an Esmarch to wrap out the leg and tourniquet was inflated to 300 mm of pressure.  We then made a direct midline incision over the patella and carried this proximally and distally.  We dissected down the knee joint and carried out a medial parapatellar arthrotomy finding a large joint effusion and significant periarticular osteophytes from throughout the knee.  We removed remnants of ACL, PCL, medial and lateral meniscus and other osteophytes from the knee.  With the knee in a flexed position we used extramedullary cutting guide setting this off the tibial crest in the tibia itself.  Taking 2 mm off the low side 9 mm off the high side correcting for varus and valgus in a neutral slope.  We made this cut without difficulty.  We then went to the  femur and used the intramedullary guide and drilled for the femur through the notch setting our distal femoral cutting guide for an 8 mm distal femoral cut, setting our distal femoral cutting guide at 5 degrees, externally rotated for the left knee.  We made this cut without difficulty and brought the knee back down in full extension and actually the 11 mm extension block to achieve full extension.  We removed all the pins and went back to the femur.  We put our femoral sizing guide based off the epicondylar axis and chose a size 3 femur.  We put our 4-in-1 cutting block for a size 3 femur, made our anterior and posterior cuts followed by our chamfer cuts.  We then made our femoral box  cut.  Attention was then turned back to the tibia.  We set our rotation the tibia off the tibial tubercle on the femur and chose a size 3 tibial tray.  We drilled for universal base plate, and made our keel punch for this as well.  We then placed our trial size 3 tibia followed by our 3 femur.  We placed an 11 mm fix- bearing polyethylene insert trial and put the knee through range of motion.  We were pleased with stability.  We then made our patellar cut and drilled 3 holes for the patellar button.  We then removed all instrumentation from the knee.  We irrigated the knee with normal saline solution using pulsatile lavage.  We then infiltrated the knee capsule and joint with a mixture of Marcaine, Exparel, and normal saline.  We then mixed our cement and cemented the real Stryker Triathlon tibial tray size 3, followed by the real size 3 femur, and we placed a real fix bearing size 11 polyethylene insert followed by a 28 patellar button. Once the cement had hardened, we removed cement debris from the knee and let the tourniquet down.  Hemostasis was obtained with electrocautery. We then irrigated the knee again with normal saline solution and then closed the arthrotomy with interrupted #1 Vicryl suture, followed by 0 Vicryl in the deep tissue, 2-0 Vicryl in the subcutaneous tissue, and interrupted staples on the skin.  Well-padded sterile dressing was applied.  She was awakened, extubated, and taken to the recovery room in stable condition.  All final counts were correct.  There were no complications noted.  Of note, Erskine Emery, PA-C assisted in the entire case.  His assistance was crucial for facilitating all aspects of this case.     Lind Guest. Ninfa Linden, M.D.   ______________________________ Lind Guest. Ninfa Linden, M.D.    CYB/MEDQ  D:  02/06/2017  T:  02/06/2017  Job:  [id number]

## 2017-02-06 NOTE — Anesthesia Postprocedure Evaluation (Addendum)
Anesthesia Post Note  Patient: Charnessa Metzner Lafontant  Procedure(s) Performed: Procedure(s) (LRB): LEFT TOTAL KNEE ARTHROPLASTY (Left)  Patient location during evaluation: PACU Anesthesia Type: Regional and General Level of consciousness: awake and alert Pain management: pain level controlled Vital Signs Assessment: post-procedure vital signs reviewed and stable Respiratory status: spontaneous breathing, nonlabored ventilation, respiratory function stable and patient connected to nasal cannula oxygen Cardiovascular status: blood pressure returned to baseline and stable Postop Assessment: no signs of nausea or vomiting Anesthetic complications: no       Last Vitals:  Vitals:   02/06/17 1932 02/06/17 2025  BP:  127/67  Pulse: 92 91  Resp: 18 18  Temp: 36.5 C 36.4 C    Last Pain:  Vitals:   02/06/17 2025  TempSrc: Oral  PainSc:                  FITZGERALD,W. EDMOND

## 2017-02-06 NOTE — Anesthesia Procedure Notes (Signed)
Procedure Name: Intubation Date/Time: 02/06/2017 3:37 PM Performed by: Shirlyn Goltz Pre-anesthesia Checklist: Patient identified, Emergency Drugs available, Suction available and Patient being monitored Patient Re-evaluated:Patient Re-evaluated prior to inductionOxygen Delivery Method: Circle system utilized Preoxygenation: Pre-oxygenation with 100% oxygen Intubation Type: IV induction Ventilation: Mask ventilation without difficulty and Oral airway inserted - appropriate to patient size Laryngoscope Size: Mac and 4 Grade View: Grade I Tube type: Oral Tube size: 7.5 mm Number of attempts: 1 Airway Equipment and Method: Stylet Placement Confirmation: ETT inserted through vocal cords under direct vision,  positive ETCO2 and breath sounds checked- equal and bilateral Secured at: 21 cm Tube secured with: Tape Dental Injury: Teeth and Oropharynx as per pre-operative assessment

## 2017-02-06 NOTE — Progress Notes (Signed)
Orthopedic Tech Progress Note Patient Details:  [name] [birth year] [id number]  CPM Left Knee CPM Left Knee: On Left Knee Flexion (Degrees): 60 Left Knee Extension (Degrees): 0   [name] 02/06/2017, 6:16 PM

## 2017-02-06 NOTE — Anesthesia Preprocedure Evaluation (Addendum)
Anesthesia Evaluation  Patient identified by MRN, date of birth, ID band Patient awake    Reviewed: Allergy & Precautions, H&P , NPO status , Patient's Chart, lab work & pertinent test results  History of Anesthesia Complications Negative for: history of anesthetic complications  Airway Mallampati: I   Neck ROM: Full    Dental  (+) Edentulous Upper   Pulmonary shortness of breath,    breath sounds clear to auscultation       Cardiovascular hypertension,  Rhythm:Regular Rate:Normal     Neuro/Psych    GI/Hepatic GERD  ,  Endo/Other  diabetesHypothyroidism   Renal/GU      Musculoskeletal   Abdominal   Peds  Hematology   Anesthesia Other Findings   Reproductive/Obstetrics                            Anesthesia Physical  Anesthesia Plan  ASA: III  Anesthesia Plan: Regional and General   Post-op Pain Management: GA combined w/ Regional for post-op pain   Induction: Intravenous  Airway Management Planned: Oral ETT  Additional Equipment:   Intra-op Plan:   Post-operative Plan: Extubation in OR  Informed Consent: I have reviewed the patients History and Physical, chart, labs and discussed the procedure including the risks, benefits and alternatives for the proposed anesthesia with the patient or authorized representative who has indicated his/her understanding and acceptance.   Dental advisory given  Plan Discussed with: CRNA  Anesthesia Plan Comments: (Attempted spinal, but unsuccessful )       Anesthesia Quick Evaluation

## 2017-02-06 NOTE — Transfer of Care (Signed)
Immediate Anesthesia Transfer of Care Note  Patient: [name]  Procedure(s) Performed: Procedure(s): LEFT TOTAL KNEE ARTHROPLASTY (Left)  Patient Location: PACU  Anesthesia Type:General and Regional  Level of Consciousness: awake, alert , oriented and patient cooperative  Airway & Oxygen Therapy: Patient Spontanous Breathing and Patient connected to face mask oxygen  Post-op Assessment: Report given to RN and Post -op Vital signs reviewed and stable  Post vital signs: Reviewed and stable  Last Vitals:  Vitals:   02/06/17 1338 02/06/17 1752  BP: (!) 116/52 (!) 113/58  Pulse: 94 88  Resp: 16 (!) 22  Temp: 36.9 C 36.2 C    Last Pain:  Vitals:   02/06/17 1338  TempSrc: Oral  PainSc:          Complications: No apparent anesthesia complications

## 2017-02-06 NOTE — Op Note (Deleted)
  The note originally documented on this encounter has been moved the the encounter in which it belongs.  

## 2017-02-06 NOTE — Brief Op Note (Signed)
02/06/2017  5:16 PM  PATIENT:  [name]  66 y.o. female  PRE-OPERATIVE DIAGNOSIS:  osteoarthritis left knee  POST-OPERATIVE DIAGNOSIS:  osteoarthritis left knee  PROCEDURE:  Procedure(s): LEFT TOTAL KNEE ARTHROPLASTY (Left)  SURGEON:  Surgeon(s) and Role:    * Mcarthur Rossetti, MD - Primary  PHYSICIAN ASSISTANT: Benita Stabile, PA-C  ANESTHESIA:   local, regional and general  COUNTS:  YES  TOURNIQUET:   Total Tourniquet Time Documented: Thigh (Left) - 62 minutes Total: Thigh (Left) - 62 minutes   DICTATION: .Other Dictation: Dictation Number [id number]  PLAN OF CARE: Admit to inpatient   PATIENT DISPOSITION:  PACU - hemodynamically stable.   Delay start of Pharmacological VTE agent (>24hrs) due to surgical blood loss or risk of bleeding: no

## 2017-02-06 NOTE — H&P (Signed)
TOTAL KNEE ADMISSION H&P  Patient is being admitted for left total knee arthroplasty.  Subjective:  Chief Complaint:left knee pain.  HPI: [name], 66 y.o. female, has a history of pain and functional disability in the left knee due to arthritis and has failed non-surgical conservative treatments for greater than 12 weeks to includeNSAID's and/or analgesics, corticosteriod injections, viscosupplementation injections, flexibility and strengthening excercises, use of assistive devices, weight reduction as appropriate and activity modification.  Onset of symptoms was gradual, starting 5 years ago with gradually worsening course since that time. The patient noted no past surgery on the left knee(s).  Patient currently rates pain in the left knee(s) at 10 out of 10 with activity. Patient has night pain, worsening of pain with activity and weight bearing, pain that interferes with activities of daily living, pain with passive range of motion, crepitus and joint swelling.  Patient has evidence of subchondral cysts, subchondral sclerosis, periarticular osteophytes, joint subluxation and joint space narrowing by imaging studies. There is no active infection.  Patient Active Problem List   Diagnosis Date Noted  . Unilateral primary osteoarthritis, right knee 01/10/2017  . Unilateral primary osteoarthritis, left knee 01/10/2017  . Subclinical hypothyroidism 10/11/2016  . Decreased hearing of left ear 10/11/2016  . Hypertension   . Diabetes mellitus type 2 in obese ([place]) 12/31/2015  . Hypertriglyceridemia 08/30/2015  . Dyspnea 04/02/2013  . Cough 02/12/2013  . Abdominal pain, left lateral 04/03/2012  . Edema 08/22/2011  . Tachycardia 08/22/2011  . Diabetes ([place]) 10/16/2007  . OBESITY, MORBID 10/16/2007  . Essential hypertension 10/16/2007  . Osteoarthrosis, unspecified whether generalized or localized, involving lower leg 10/16/2007   Past Medical History:  Diagnosis Date  . Abdominal hernia    . Arthritis    knees, back, R ankle   . Diabetes mellitus   . Edema    legs  . GERD (gastroesophageal reflux disease)   . HOH (hard of hearing)   . Hypertension   . Obesity    morbid obesity    Past Surgical History:  Procedure Laterality Date  . [place]?   right  . CARPAL TUNNEL RELEASE Left 07/10/2014   Procedure: LEFT ENDOSCOPIC CARPAL TUNNEL RELEASE ;  Surgeon: Jolyn Nap, MD;  Location: [facility];  Service: Orthopedics;  Laterality: Left;  . COLONOSCOPY    . HERNIA REPAIR  07/03/2011   abd hernia  . NOSE SURGERY  2008   sinus  . TUBAL LIGATION      No prescriptions prior to admission.   Allergies  Allergen Reactions  . Sulfa Antibiotics Rash    Social History  Substance Use Topics  . Smoking status: Never Smoker  . Smokeless tobacco: Never Used  . Alcohol use No    Family History  Problem Relation Age of Onset  . Hypertension Mother   . Kidney disease Mother   . Diabetes Father   . Hypertension Father   . Diabetes Sister   . Hypertension Sister   . Diabetes Brother   . Hypertension Brother   . Heart disease Brother 66    heart attack  . Hypertension Sister      Review of Systems  Musculoskeletal: Positive for joint pain.  All other systems reviewed and are negative.   Objective:  Physical Exam  Constitutional: She is oriented to person, place, and time. She appears well-developed and well-nourished.  HENT:  Head: Normocephalic and atraumatic.  Eyes: EOM are normal. Pupils are equal,  round, and reactive to light.  Neck: Normal range of motion.  Cardiovascular: Normal rate and regular rhythm.   Respiratory: Effort normal and breath sounds normal.  GI: Soft. Bowel sounds are normal.  Musculoskeletal:       Left knee: She exhibits decreased range of motion, swelling, effusion and abnormal alignment. Tenderness found. Medial joint line and lateral joint line tenderness noted.  Neurological: She is alert  and oriented to person, place, and time.  Skin: Skin is warm and dry.  Psychiatric: She has a normal mood and affect.    Vital signs in last 24 hours:    Labs:   Estimated body mass index is 51.1 kg/m (pended) as calculated from the following:   Height as of 01/30/17: (P) 5\' 6"  (1.676 m).   Weight as of 01/30/17: (P) 316 lb 9.6 oz (143.6 kg).   Imaging Review Plain radiographs demonstrate severe degenerative joint disease of the left knee(s). The overall alignment ismild varus. The bone quality appears to be good for age and reported activity level.  Assessment/Plan:  End stage arthritis, left knee   The patient history, physical examination, clinical judgment of the provider and imaging studies are consistent with end stage degenerative joint disease of the left knee(s) and total knee arthroplasty is deemed medically necessary. The treatment options including medical management, injection therapy arthroscopy and arthroplasty were discussed at length. The risks and benefits of total knee arthroplasty were presented and reviewed. The risks due to aseptic loosening, infection, stiffness, patella tracking problems, thromboembolic complications and other imponderables were discussed. The patient acknowledged the explanation, agreed to proceed with the plan and consent was signed. Patient is being admitted for inpatient treatment for surgery, pain control, PT, OT, prophylactic antibiotics, VTE prophylaxis, progressive ambulation and ADL's and discharge planning. The patient is planning to be discharged to skilled nursing facility

## 2017-02-06 NOTE — Anesthesia Procedure Notes (Signed)
Anesthesia Regional Block: Adductor canal block   Pre-Anesthetic Checklist: ,, timeout performed, Correct Patient, Correct Site, Correct Laterality, Correct Procedure, Correct Position, site marked, Risks and benefits discussed,  Surgical consent,  Pre-op evaluation,  At surgeon's request and post-op pain management  Laterality: Left  Prep: chloraprep       Needles:  Injection technique: Single-shot  Needle Type: Stimiplex     Needle Length: 9cm  Needle Gauge: 21     Additional Needles:   Procedures: ultrasound guided,,,,,,,,  Narrative:  Start time: 02/06/2017 2:48 PM End time: 02/06/2017 2:53 PM Injection made incrementally with aspirations every 5 mL.  Performed by: Personally  Anesthesiologist: Nolon Nations  Additional Notes: BP cuff, EKG monitors applied. Sedation begun. Artery and nerve location verified with U/S and anesthetic injected incrementally, slowly, and after negative aspirations under direct u/s guidance. Good fascial /perineural spread. Tolerated well.

## 2017-02-07 ENCOUNTER — Encounter (HOSPITAL_COMMUNITY): Payer: Self-pay | Admitting: Orthopaedic Surgery

## 2017-02-07 DIAGNOSIS — D62 Acute posthemorrhagic anemia: Secondary | ICD-10-CM | POA: Diagnosis not present

## 2017-02-07 DIAGNOSIS — Z791 Long term (current) use of non-steroidal anti-inflammatories (NSAID): Secondary | ICD-10-CM | POA: Diagnosis not present

## 2017-02-07 DIAGNOSIS — Z882 Allergy status to sulfonamides status: Secondary | ICD-10-CM | POA: Diagnosis not present

## 2017-02-07 DIAGNOSIS — I10 Essential (primary) hypertension: Secondary | ICD-10-CM | POA: Diagnosis not present

## 2017-02-07 DIAGNOSIS — M2540 Effusion, unspecified joint: Secondary | ICD-10-CM | POA: Diagnosis not present

## 2017-02-07 DIAGNOSIS — Z79899 Other long term (current) drug therapy: Secondary | ICD-10-CM | POA: Diagnosis not present

## 2017-02-07 DIAGNOSIS — M1712 Unilateral primary osteoarthritis, left knee: Secondary | ICD-10-CM | POA: Diagnosis not present

## 2017-02-07 DIAGNOSIS — E119 Type 2 diabetes mellitus without complications: Secondary | ICD-10-CM | POA: Diagnosis not present

## 2017-02-07 DIAGNOSIS — H9192 Unspecified hearing loss, left ear: Secondary | ICD-10-CM | POA: Diagnosis not present

## 2017-02-07 DIAGNOSIS — Z7984 Long term (current) use of oral hypoglycemic drugs: Secondary | ICD-10-CM | POA: Diagnosis not present

## 2017-02-07 DIAGNOSIS — K219 Gastro-esophageal reflux disease without esophagitis: Secondary | ICD-10-CM | POA: Diagnosis not present

## 2017-02-07 DIAGNOSIS — M1711 Unilateral primary osteoarthritis, right knee: Secondary | ICD-10-CM | POA: Diagnosis not present

## 2017-02-07 DIAGNOSIS — M21162 Varus deformity, not elsewhere classified, left knee: Secondary | ICD-10-CM | POA: Diagnosis not present

## 2017-02-07 LAB — CBC AND DIFFERENTIAL
HCT: 30 % — AB (ref 36–46)
Hemoglobin: 9.3 g/dL — AB (ref 12.0–16.0)
Platelets: 262 10*3/uL (ref 150–399)
WBC: 7.7 10^3/mL

## 2017-02-07 LAB — BASIC METABOLIC PANEL
Anion gap: 9 (ref 5–15)
BUN: 24 mg/dL — AB (ref 4–21)
BUN: 24 mg/dL — ABNORMAL HIGH (ref 6–20)
CO2: 26 mmol/L (ref 22–32)
Calcium: 8.4 mg/dL — ABNORMAL LOW (ref 8.9–10.3)
Chloride: 102 mmol/L (ref 101–111)
Creatinine, Ser: 1.2 mg/dL — ABNORMAL HIGH (ref 0.44–1.00)
Creatinine: 1.2 mg/dL — AB (ref 0.5–1.1)
GFR calc Af Amer: 55 mL/min — ABNORMAL LOW (ref >60)
GFR calc non Af Amer: 47 mL/min — ABNORMAL LOW (ref >60)
Glucose, Bld: 146 mg/dL — ABNORMAL HIGH (ref 65–99)
Glucose: 146 mg/dL
Potassium: 4.3 mmol/L (ref 3.5–5.1)
Sodium: 137 mmol/L (ref 135–145)
Sodium: 137 mmol/L (ref 137–147)

## 2017-02-07 LAB — CBC
HCT: 30.4 % — ABNORMAL LOW (ref 36.0–46.0)
Hemoglobin: 9.3 g/dL — ABNORMAL LOW (ref 12.0–15.0)
MCH: 29.2 pg (ref 26.0–34.0)
MCHC: 30.6 g/dL (ref 30.0–36.0)
MCV: 95.3 fL (ref 78.0–100.0)
Platelets: 262 10*3/uL (ref 150–400)
RBC: 3.19 MIL/uL — ABNORMAL LOW (ref 3.87–5.11)
RDW: 14.5 % (ref 11.5–15.5)
WBC: 7.7 10*3/uL (ref 4.0–10.5)

## 2017-02-07 LAB — GLUCOSE, CAPILLARY
Glucose-Capillary: 154 mg/dL — ABNORMAL HIGH (ref 65–99)
Glucose-Capillary: 393 mg/dL — ABNORMAL HIGH (ref 65–99)
Glucose-Capillary: 153 mg/dL — ABNORMAL HIGH (ref 65–99)

## 2017-02-07 NOTE — Progress Notes (Signed)
Subjective: 1 Day Post-Op Procedure(s) (LRB): LEFT TOTAL KNEE ARTHROPLASTY (Left) Patient reports pain as moderate.  Acute blood loss anemia from surgery, but tolerating well thus far.  Objective: Vital signs in last 24 hours: Temp:  [97.2 F (36.2 C)-99.9 F (37.7 C)] 99.9 F (37.7 C) (02/21 0625) Pulse Rate:  [84-112] 112 (02/21 0625) Resp:  [11-22] 18 (02/21 0625) BP: (113-154)/(52-76) 135/61 (02/21 0625) SpO2:  [93 %-100 %] 93 % (02/21 0625) Weight:  [316 lb (143.3 kg)] 316 lb (143.3 kg) (02/20 1338)  Intake/Output from previous day: 02/20 0701 - 02/21 0700 In: 1300 [P.O.:80; I.V.:1220] Out: 830 [Urine:730; Blood:100] Intake/Output this shift: No intake/output data recorded.   Recent Labs  02/07/17 0443  HGB 9.3*    Recent Labs  02/07/17 0443  WBC 7.7  RBC 3.19*  HCT 30.4*  PLT 262    Recent Labs  02/07/17 0443  NA 137  K 4.3  CL 102  CO2 26  BUN 24*  CREATININE 1.20*  GLUCOSE 146*  CALCIUM 8.4*   No results for input(s): LABPT, INR in the last 72 hours.  Sensation intact distally Intact pulses distally Dorsiflexion/Plantar flexion intact Incision: dressing C/D/I Compartment soft  Assessment/Plan: 1 Day Post-Op Procedure(s) (LRB): LEFT TOTAL KNEE ARTHROPLASTY (Left) Up with therapy Discharge to SNF by Friday.  Mcarthur Rossetti 02/07/2017, 7:41 AM

## 2017-02-07 NOTE — Discharge Instructions (Addendum)
Information on my medicine - XARELTO® (Rivaroxaban) ° ° °Why was Xarelto® prescribed for you? °Xarelto® was prescribed for you to reduce the risk of blood clots forming after orthopedic surgery. The medical term for these abnormal blood clots is venous thromboembolism (VTE). ° °What do you need to know about xarelto® ? °Take your Xarelto® ONCE DAILY at the same time every day. °You may take it either with or without food. ° °If you have difficulty swallowing the tablet whole, you may crush it and mix in applesauce just prior to taking your dose. ° °Take Xarelto® exactly as prescribed by your doctor and DO NOT stop taking Xarelto® without talking to the doctor who prescribed the medication.  Stopping without other VTE prevention medication to take the place of Xarelto® may increase your risk of developing a clot. ° °After discharge, you should have regular check-up appointments with your healthcare provider that is prescribing your Xarelto®.   ° °What do you do if you miss a dose? °If you miss a dose, take it as soon as you remember on the same day then continue your regularly scheduled once daily regimen the next day. Do not take two doses of Xarelto® on the same day.  ° °Important Safety Information °A possible side effect of Xarelto® is bleeding. You should call your healthcare provider right away if you experience any of the following: °? Bleeding from an injury or your nose that does not stop. °? Unusual colored urine (red or dark brown) or unusual colored stools (red or black). °? Unusual bruising for unknown reasons. °? A serious fall or if you hit your head (even if there is no bleeding). ° °Some medicines may interact with Xarelto® and might increase your risk of bleeding while on Xarelto®. To help avoid this, consult your healthcare provider or pharmacist prior to using any new prescription or non-prescription medications, including herbals, vitamins, non-steroidal anti-inflammatory drugs (NSAIDs) and  supplements. ° °This website has more information on Xarelto®: www.xarelto.com. ° °INSTRUCTIONS AFTER JOINT REPLACEMENT  ° °o Remove items at home which could result in a fall. This includes throw rugs or furniture in walking pathways °o ICE to the affected joint every three hours while awake for 30 minutes at a time, for at least the first 3-5 days, and then as needed for pain and swelling.  Continue to use ice for pain and swelling. You may notice swelling that will progress down to the foot and ankle.  This is normal after surgery.  Elevate your leg when you are not up walking on it.   °o Continue to use the breathing machine you got in the hospital (incentive spirometer) which will help keep your temperature down.  It is common for your temperature to cycle up and down following surgery, especially at night when you are not up moving around and exerting yourself.  The breathing machine keeps your lungs expanded and your temperature down. ° ° °DIET:  As you were doing prior to hospitalization, we recommend a well-balanced diet. ° °DRESSING / WOUND CARE / SHOWERING ° °Keep the surgical dressing until follow up.  The dressing is water proof, so you can shower without any extra covering.  IF THE DRESSING FALLS OFF or the wound gets wet inside, change the dressing with sterile gauze.  Please use good hand washing techniques before changing the dressing.  Do not use any lotions or creams on the incision until instructed by your surgeon.   ° °ACTIVITY ° °o Increase activity   slowly as tolerated, but follow the weight bearing instructions below.   °o No driving for 6 weeks or until further direction given by your physician.  You cannot drive while taking narcotics.  °o No lifting or carrying greater than 10 lbs. until further directed by your surgeon. °o Avoid periods of inactivity such as sitting longer than an hour when not asleep. This helps prevent blood clots.  °o You may return to work once you are authorized by  your doctor.  ° ° ° °WEIGHT BEARING  ° °Weight bearing as tolerated with assist device (walker, cane, etc) as directed, use it as long as suggested by your surgeon or therapist, typically at least 4-6 weeks. ° ° °EXERCISES ° °Results after joint replacement surgery are often greatly improved when you follow the exercise, range of motion and muscle strengthening exercises prescribed by your doctor. Safety measures are also important to protect the joint from further injury. Any time any of these exercises cause you to have increased pain or swelling, decrease what you are doing until you are comfortable again and then slowly increase them. If you have problems or questions, call your caregiver or physical therapist for advice.  ° °Rehabilitation is important following a joint replacement. After just a few days of immobilization, the muscles of the leg can become weakened and shrink (atrophy).  These exercises are designed to build up the tone and strength of the thigh and leg muscles and to improve motion. Often times heat used for twenty to thirty minutes before working out will loosen up your tissues and help with improving the range of motion but do not use heat for the first two weeks following surgery (sometimes heat can increase post-operative swelling).  ° °These exercises can be done on a training (exercise) mat, on the floor, on a table or on a bed. Use whatever works the best and is most comfortable for you.    Use music or television while you are exercising so that the exercises are a pleasant break in your day. This will make your life better with the exercises acting as a break in your routine that you can look forward to.   Perform all exercises about fifteen times, three times per day or as directed.  You should exercise both the operative leg and the other leg as well. ° °Exercises include: °  °• Quad Sets - Tighten up the muscle on the front of the thigh (Quad) and hold for 5-10 seconds.    °• Straight Leg Raises - With your knee straight (if you were given a brace, keep it on), lift the leg to 60 degrees, hold for 3 seconds, and slowly lower the leg.  Perform this exercise against resistance later as your leg gets stronger.  °• Leg Slides: Lying on your back, slowly slide your foot toward your buttocks, bending your knee up off the floor (only go as far as is comfortable). Then slowly slide your foot back down until your leg is flat on the floor again.  °• Angel Wings: Lying on your back spread your legs to the side as far apart as you can without causing discomfort.  °• Hamstring Strength:  Lying on your back, push your heel against the floor with your leg straight by tightening up the muscles of your buttocks.  Repeat, but this time bend your knee to a comfortable angle, and push your heel against the floor.  You may put a pillow under the heel to make   it more comfortable if necessary.  ° °A rehabilitation program following joint replacement surgery can speed recovery and prevent re-injury in the future due to weakened muscles. Contact your doctor or a physical therapist for more information on knee rehabilitation.  ° ° °CONSTIPATION ° °Constipation is defined medically as fewer than three stools per week and severe constipation as less than one stool per week.  Even if you have a regular bowel pattern at home, your normal regimen is likely to be disrupted due to multiple reasons following surgery.  Combination of anesthesia, postoperative narcotics, change in appetite and fluid intake all can affect your bowels.  ° °YOU MUST use at least one of the following options; they are listed in order of increasing strength to get the job done.  They are all available over the counter, and you may need to use some, POSSIBLY even all of these options:   ° °Drink plenty of fluids (prune juice may be helpful) and high fiber foods °Colace 100 mg by mouth twice a day  °Senokot for constipation as directed and as  needed Dulcolax (bisacodyl), take with full glass of water  °Miralax (polyethylene glycol) once or twice a day as needed. ° °If you have tried all these things and are unable to have a bowel movement in the first 3-4 days after surgery call either your surgeon or your primary doctor.   ° °If you experience loose stools or diarrhea, hold the medications until you stool forms back up.  If your symptoms do not get better within 1 week or if they get worse, check with your doctor.  If you experience "the worst abdominal pain ever" or develop nausea or vomiting, please contact the office immediately for further recommendations for treatment. ° ° °ITCHING:  If you experience itching with your medications, try taking only a single pain pill, or even half a pain pill at a time.  You can also use Benadryl over the counter for itching or also to help with sleep.  ° °TED HOSE STOCKINGS:  Use stockings on both legs until for at least 2 weeks or as directed by physician office. They may be removed at night for sleeping. ° °MEDICATIONS:  See your medication summary on the “After Visit Summary” that nursing will review with you.  You may have some home medications which will be placed on hold until you complete the course of blood thinner medication.  It is important for you to complete the blood thinner medication as prescribed. ° °PRECAUTIONS:  If you experience chest pain or shortness of breath - call 911 immediately for transfer to the hospital emergency department.  ° °If you develop a fever greater that 101 F, purulent drainage from wound, increased redness or drainage from wound, foul odor from the wound/dressing, or calf pain - CONTACT YOUR SURGEON.   °                                                °FOLLOW-UP APPOINTMENTS:  If you do not already have a post-op appointment, please call the office for an appointment to be seen by your surgeon.  Guidelines for how soon to be seen are listed in your “After Visit Summary”, but  are typically between 1-4 weeks after surgery. ° °OTHER INSTRUCTIONS:  ° °Knee Replacement:  Do not place pillow under knee, focus   on keeping the knee straight while resting. CPM instructions: 0-90 degrees, 2 hours in the morning, 2 hours in the afternoon, and 2 hours in the evening. Place foam block, curve side up under heel at all times except when in CPM or when walking.  DO NOT modify, tear, cut, or change the foam block in any way. ° °MAKE SURE YOU:  °• Understand these instructions.  °• Get help right away if you are not doing well or get worse.  ° ° °Thank you for letting us be a part of your medical care team.  It is a privilege we respect greatly.  We hope these instructions will help you stay on track for a fast and full recovery!  ° °

## 2017-02-07 NOTE — NC FL2 (Signed)
[place] MEDICAID FL2 LEVEL OF CARE SCREENING TOOL     IDENTIFICATION  Patient Name: [name] Birthdate: 03-22-1951 Sex: female Admission Date (Current Location): 02/06/2017  [facility] and [place] Number:  Herbalist and Address:  [facility] [place], [place] [address], [place],  [address]      Provider Number: [id number]  Attending Physician Name and Address:  Mcarthur Rossetti, *  Relative Name and Phone Number:       Current Level of Care: Hospital Recommended Level of Care: [place] Prior Approval Number:    Date Approved/Denied:   PASRR Number: [id number]  Discharge Plan: SNF    Current Diagnoses: Patient Active Problem List   Diagnosis Date Noted  . 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 Unilateral primary osteoarthritis, right knee 01/10/2017  . Unilateral primary osteoarthritis, left knee 01/10/2017  . Subclinical hypothyroidism 10/11/2016  . Decreased hearing of left ear 10/11/2016  . Hypertension   . Diabetes mellitus type 2 in obese ([place]) 12/31/2015  . Hypertriglyceridemia 08/30/2015  . Dyspnea 04/02/2013  . Cough 02/12/2013  . Abdominal pain, left lateral 04/03/2012  . Edema 08/22/2011  . Tachycardia 08/22/2011  . Diabetes ([place]) 10/16/2007  . OBESITY, MORBID 10/16/2007  . Essential hypertension 10/16/2007  . Osteoarthrosis, unspecified whether generalized or localized, involving lower leg 10/16/2007    Orientation RESPIRATION BLADDER Height & Weight     Self, Time, Situation  O2 (Nasal Cannula, 2L) Continent Weight: (!) 316 lb (143.3 kg) Height:  5\' 6"  (167.6 cm)  BEHAVIORAL SYMPTOMS/MOOD NEUROLOGICAL BOWEL NUTRITION STATUS      Continent  (Please see discharge summary)  AMBULATORY STATUS COMMUNICATION OF NEEDS Skin   Limited Assist Verbally Normal                       Personal Care Assistance Level of Assistance  Bathing, Feeding, Dressing Bathing Assistance:  Limited assistance Feeding assistance: Independent Dressing Assistance: Limited assistance     Functional Limitations Info  Sight, Hearing, Speech Sight Info: Adequate (Wears reading glasses) Hearing Info: Adequate Speech Info: Adequate    SPECIAL CARE FACTORS FREQUENCY  PT (By licensed PT), OT (By licensed OT)     PT Frequency: 2x week OT Frequency: 2x week            Contractures Contractures Info: Not present    Additional Factors Info  Code Status, Allergies Code Status Info: Full Code Allergies Info: Other, Sulfa Antibiotics           Current Medications (02/07/2017):  This is the current hospital active medication list Current Facility-Administered Medications  Medication Dose Route Frequency Provider Last Rate Last Dose  . 0.9 %  sodium chloride infusion   Intravenous Continuous Mcarthur Rossetti, MD 75 mL/hr at 02/07/17 0056    . acetaminophen (TYLENOL) tablet 650 mg  650 mg Oral Q6H PRN Mcarthur Rossetti, MD       Or  . acetaminophen (TYLENOL) suppository 650 mg  650 mg Rectal Q6H PRN Mcarthur Rossetti, MD      . alum & mag hydroxide-simeth (MAALOX/MYLANTA) 200-200-20 MG/5ML suspension 30 mL  30 mL Oral Q4H PRN Mcarthur Rossetti, MD      . cholecalciferol (VITAMIN D) tablet 1,000 Units  1,000 Units Oral Daily Mcarthur Rossetti, MD      . diphenhydrAMINE (BENADRYL) 12.5 MG/5ML elixir 12.5-25 mg  12.5-25 mg Oral Q4H PRN Mcarthur Rossetti, MD      .  docusate sodium (COLACE) capsule 100 mg  100 mg Oral BID Mcarthur Rossetti, MD   100 mg at 02/06/17 2244  . famotidine (PEPCID) tablet 20 mg  20 mg Oral BID Mcarthur Rossetti, MD   20 mg at 02/06/17 2244  . glimepiride (AMARYL) tablet 4 mg  4 mg Oral Q breakfast Mcarthur Rossetti, MD   4 mg at 02/07/17 [id number]  . hydrochlorothiazide (MICROZIDE) capsule 12.5 mg  12.5 mg Oral Daily Mcarthur Rossetti, MD      . HYDROmorphone (DILAUDID) injection 1 mg  1 mg Intravenous Q2H PRN  Mcarthur Rossetti, MD      . ketorolac (TORADOL) 15 MG/ML injection 7.5 mg  7.5 mg Intravenous Q6H Mcarthur Rossetti, MD   7.5 mg at 02/07/17 [phone]  . losartan (COZAAR) tablet 100 mg  100 mg Oral Daily Mcarthur Rossetti, MD   100 mg at 02/06/17 2249  . menthol-cetylpyridinium (CEPACOL) lozenge 3 mg  1 lozenge Oral PRN Mcarthur Rossetti, MD       Or  . phenol (CHLORASEPTIC) mouth spray 1 spray  1 spray Mouth/Throat PRN Mcarthur Rossetti, MD      . metFORMIN (GLUCOPHAGE) tablet 1,000 mg  1,000 mg Oral BID WC Mcarthur Rossetti, MD      . methocarbamol (ROBAXIN) tablet 500 mg  500 mg Oral Q6H PRN Mcarthur Rossetti, MD   500 mg at 02/06/17 1824   Or  . methocarbamol (ROBAXIN) 500 mg in dextrose 5 % 50 mL IVPB  500 mg Intravenous Q6H PRN Mcarthur Rossetti, MD      . metoCLOPramide (REGLAN) tablet 5-10 mg  5-10 mg Oral Q8H PRN Mcarthur Rossetti, MD       Or  . metoCLOPramide (REGLAN) injection 5-10 mg  5-10 mg Intravenous Q8H PRN Mcarthur Rossetti, MD      . multivitamin with minerals tablet 1 tablet  1 tablet Oral Daily Mcarthur Rossetti, MD      . ondansetron [facility]) tablet 4 mg  4 mg Oral Q6H PRN Mcarthur Rossetti, MD       Or  . ondansetron [facility]) injection 4 mg  4 mg Intravenous Q6H PRN Mcarthur Rossetti, MD      . oxyCODONE (Oxy IR/ROXICODONE) immediate release tablet 5-10 mg  5-10 mg Oral Q3H PRN Mcarthur Rossetti, MD   10 mg at 02/07/17 [id number]  . polyethylene glycol (MIRALAX / GLYCOLAX) packet 17 g  17 g Oral Daily PRN Mcarthur Rossetti, MD      . rivaroxaban [facility]) tablet 10 mg  10 mg Oral Q breakfast Mcarthur Rossetti, MD      . zolpidem [facility]) tablet 5 mg  5 mg Oral QHS PRN Mcarthur Rossetti, MD         Discharge Medications: Please see discharge summary for a list of discharge medications.  Relevant Imaging Results:  Relevant Lab Results:   Additional Information SSN: [id number]  Alla German, LCSW

## 2017-02-07 NOTE — Evaluation (Signed)
Physical Therapy Evaluation Patient Details Name: [name] MRN: [id number] DOB: 1951-06-24 Today's Date: 02/07/2017   History of Present Illness  66 y/o female s/p L TKA on 02/06/17. PMH: HTN, diabetes, obesity.   Clinical Impression  Pt is s/p TKA resulting in the deficits listed below (see PT Problem List). Pt able to ambulate 12 ft with rw and min assistance. Based upon the patient's current mobility level, anticipate D/C to SNF following acute stay. Pt will benefit from skilled PT to increase their independence and safety with mobility to allow discharge to the venue listed below.      Follow Up Recommendations SNF    Equipment Recommendations   (to be addressed at next venue)    Recommendations for Other Services       Precautions / Restrictions Precautions Precautions: Fall;Knee Precaution Booklet Issued: Yes (comment) Precaution Comments: HEP provided, reviewed knee extension precautions Required Braces or Orthoses: Knee Immobilizer - Left Restrictions Weight Bearing Restrictions: Yes LLE Weight Bearing: Weight bearing as tolerated      Mobility  Bed Mobility Overal bed mobility: Needs Assistance Bed Mobility: Supine to Sit     Supine to sit: Min assist     General bed mobility comments: sitting in chiar upon arrival  Transfers Overall transfer level: Needs assistance Equipment used: Rolling walker (2 wheeled) Transfers: Sit to/from Stand Sit to Stand: Mod assist Stand pivot transfers: Min assist       General transfer comment: cues for hand placement.   Ambulation/Gait Ambulation/Gait assistance: Min guard Ambulation Distance (Feet): 12 Feet Assistive device: Rolling walker (2 wheeled) Gait Pattern/deviations: Step-to pattern;Decreased weight shift to left;Decreased stance time - left Gait velocity: decreased   General Gait Details: slow pattern, reinforcing WBAT status  Stairs            Wheelchair Mobility    Modified Rankin  (Stroke Patients Only)       Balance Overall balance assessment: Needs assistance Sitting-balance support: No upper extremity supported Sitting balance-Leahy Scale: Good Sitting balance - Comments: Sitting EOB   Standing balance support: Bilateral upper extremity supported Standing balance-Leahy Scale: Poor Standing balance comment: using rw                             Pertinent Vitals/Pain Pain Assessment: 0-10 Pain Score: 7  Pain Location: Left Knee Pain Descriptors / Indicators: Throbbing Pain Intervention(s): Limited activity within patient's tolerance;Monitored during session;RN gave pain meds during session    [place] expects to be discharged to:: Skilled nursing facility Living Arrangements: Alone   Type of Home: Apartment Home Access: Level entry     Home Layout: One level Home Equipment: Cane - single point      Prior Function Level of Independence: Independent with assistive device(s)         Comments: reports using SPC     Hand Dominance       Extremity/Trunk Assessment   Upper Extremity Assessment Upper Extremity Assessment: Overall WFL for tasks assessed    Lower Extremity Assessment Lower Extremity Assessment: LLE deficits/detail LLE Deficits / Details: fair quad activation, unable to perform SLR       Communication   Communication: No difficulties  Cognition Arousal/Alertness: Awake/alert Behavior During Therapy: WFL for tasks assessed/performed Overall Cognitive Status: Within Functional Limits for tasks assessed                      General Comments  Exercises Total Joint Exercises Ankle Circles/Pumps: AROM;Both;15 reps Quad Sets: Strengthening;Left;10 reps Heel Slides: AAROM;Left;10 reps Goniometric ROM: 50 degrees Lt knee flexion   Assessment/Plan    PT Assessment Patient needs continued PT services  PT Problem List Decreased strength;Decreased range of motion;Decreased  activity tolerance;Decreased balance;Decreased mobility       PT Treatment Interventions DME instruction;Gait training;Stair training;Functional mobility training;Therapeutic activities;Therapeutic exercise;Patient/family education    PT Goals (Current goals can be found in the Care Plan section)  Acute Rehab PT Goals Patient Stated Goal: Go to SNF Rehab  PT Goal Formulation: With patient Time For Goal Achievement: 02/21/17 Potential to Achieve Goals: Good    Frequency 7X/week   Barriers to discharge        Co-evaluation               End of Session Equipment Utilized During Treatment: Gait belt Activity Tolerance: Patient tolerated treatment well Patient left: in chair;with call bell/phone within reach;with family/visitor present (in bone foam) Nurse Communication: Mobility status PT Visit Diagnosis: Unsteadiness on feet (R26.81);Muscle weakness (generalized) (M62.81);Difficulty in walking, not elsewhere classified (R26.2)         Time: [id number] PT Time Calculation (min) (ACUTE ONLY): 27 min   Charges:   PT Evaluation $PT Eval Moderate Complexity: 1 Procedure PT Treatments $Gait Training: 8-22 mins   PT G Codes:         Cassell Clement, PT, CSCS Pager [phone] Office [phone]  02/07/2017, 12:32 PM

## 2017-02-07 NOTE — Evaluation (Signed)
Occupational Therapy Evaluation Patient Details Name: [name] MRN: [id number] DOB: [birth year] Today's Date: 02/07/2017    History of Present Illness Pt is a pleasant 66 y/o female s/p L TKA on 02/06/17.    Clinical Impression   Pt admitted as above currently demonstrating deficits in her ability to perform ADL and self care tasks as well as functional mobility/transfers related to ADL's (see OT problem list below). She should benefit from acute OT to assist in maximizing independence w/ ADL's prior to anticipated d/c to SNF for ST Rehab. Will follow acutely for OT.    Follow Up Recommendations  SNF;Supervision/Assistance - 24 hour    Equipment Recommendations  Other (comment) (Defer to next venue)    Recommendations for Other Services       Precautions / Restrictions Precautions Precautions: Fall;Knee Required Braces or Orthoses: Knee Immobilizer - Left Restrictions Weight Bearing Restrictions: Yes LLE Weight Bearing: Weight bearing as tolerated      Mobility Bed Mobility Overal bed mobility: Needs Assistance Bed Mobility: Supine to Sit     Supine to sit: Min assist     General bed mobility comments: Min A LLE to lift off/OOB  Transfers Overall transfer level: Needs assistance Equipment used: Rolling walker (2 wheeled) Transfers: Sit to/from [facility] Sit to Stand: Mod assist Stand pivot transfers: Min assist       General transfer comment: VC's and tc's for RW safety and sequencing. Increased time for mobility and Mod A sit to stand     Balance Overall balance assessment: Needs assistance Sitting-balance support: Single extremity supported;Feet supported Sitting balance-Leahy Scale: Good Sitting balance - Comments: Sitting EOB   Standing balance support: Bilateral upper extremity supported Standing balance-Leahy Scale: Poor Standing balance comment: Relies heavily on UE's/RW during static standing, vc's to stand upright as pt tends  to lean forward.                            ADL Overall ADL's : Needs assistance/impaired Eating/Feeding: Independent;Sitting   Grooming: Wash/dry hands;Wash/dry face;Set up;Sitting   Upper Body Bathing: Set up;Sitting;Minimal assistance   Lower Body Bathing: Moderate assistance;Sit to/from stand;Sitting/lateral leans   Upper Body Dressing : Set up;Sitting   Lower Body Dressing: Maximal assistance;Sit to/from stand   Toilet Transfer: Moderate assistance;BSC;Ambulation;RW;Requires wide/bariatric (Simulated transfer from EOB to recliner chair today using RW) Toilet Transfer Details (indicate cue type and reason): Will need wide/bariatric 3:1 Toileting- Clothing Manipulation and Hygiene: Moderate assistance;Sitting/lateral lean;Sit to/from stand       Functional mobility during ADLs: Minimal assistance;Cueing for sequencing;Rolling walker General ADL Comments: Pt was assessed for OT followed by pt/family education on role of OT. Pt was then agreeable to ADL retraining session to consist of functional mobility/transfers, grooming while seated and simulated toileting transfer. Pt will need 24/7 assist and plans to d/c to SNF following this acute stay.     Vision Baseline Vision/History: Wears glasses Wears Glasses: Reading only Patient Visual Report: No change from baseline       Perception     Praxis      Pertinent Vitals/Pain Pain Assessment: 0-10 Pain Score: 7  Pain Location: Left Knee Pain Descriptors / Indicators: Sore;Aching;Stabbing Pain Intervention(s): Limited activity within patient's tolerance;Monitored during session;Premedicated before session;Repositioned;Ice applied     Hand Dominance Right   Extremity/Trunk Assessment Upper Extremity Assessment Upper Extremity Assessment: Generalized weakness (R & L CTR; pt also reports relaesa at bilateral elbows.)   Lower  Extremity Assessment Lower Extremity Assessment: Defer to PT evaluation        Communication Communication Communication: No difficulties   Cognition Arousal/Alertness: Awake/alert Behavior During Therapy: WFL for tasks assessed/performed Overall Cognitive Status: Within Functional Limits for tasks assessed                     General Comments       Exercises       Shoulder Instructions      Home Living Family/patient expects to be discharged to:: Skilled nursing facility (Private residence plans for Rehab) Living Arrangements: Alone   Type of Home: Apartment Home Access: Level entry     Home Layout: One level     Bathroom Shower/Tub: Tub/shower unit Shower/tub characteristics: Architectural technologist: Standard     Home Equipment: Cane - single point;Grab bars - tub/shower          Prior Functioning/Environment Level of Independence: Independent with assistive device(s)        Comments: Ambulated w/ SPC        OT Problem List: Decreased strength;Impaired balance (sitting and/or standing);Decreased knowledge of precautions;Pain;Decreased range of motion;Obesity;Decreased activity tolerance;Decreased knowledge of use of DME or AE;Impaired UE functional use (Balance will be addressed in functional context of ADL's)      OT Treatment/Interventions: Self-care/ADL training;DME and/or AE instruction;Therapeutic activities;Energy conservation;Patient/family education    OT Goals(Current goals can be found in the care plan section) Acute Rehab OT Goals Patient Stated Goal: Go to SNF Rehab  Time For Goal Achievement: 02/21/17 Potential to Achieve Goals: Good  OT Frequency: Min 2X/week   Barriers to D/C:            Co-evaluation              End of Session Equipment Utilized During Treatment: Gait belt;Rolling walker;Left knee immobilizer CPM Left Knee CPM Left Knee: On Left Knee Flexion (Degrees): 60 Left Knee Extension (Degrees): 0 Nurse Communication: Mobility status  Activity Tolerance: Patient tolerated treatment  well Patient left: in chair;with call bell/phone within reach;with nursing/sitter in room;with family/visitor present  OT Visit Diagnosis: Other abnormalities of gait and mobility (R26.89);Pain Pain - Right/Left: Left Pain - part of body: Knee                ADL either performed or assessed with clinical judgement  Time: 0911-0942 OT Time Calculation (min): 31 min Charges:  OT General Charges $OT Visit: 1 Procedure OT Evaluation $OT Eval Moderate Complexity: 1 Procedure OT Treatments $Self Care/Home Management : 8-22 mins G-Codes:       Almyra Deforest, OTR/L 02/07/2017, 9:54 AM

## 2017-02-07 NOTE — Progress Notes (Signed)
Inpatient Diabetes Program Recommendations  AACE/ADA: New Consensus Statement on Inpatient Glycemic Control (2015)  Target Ranges:  Prepandial:   less than 140 mg/dL      Peak postprandial:   less than 180 mg/dL (1-2 hours)      Critically ill patients:  140 - 180 mg/dL  Results for [name], [name] (MRN [id number]) as of 02/07/2017 13:46  Ref. Range 02/06/2017 13:50 02/06/2017 17:54 02/06/2017 21:24 02/07/2017 06:38 02/07/2017 12:07  Glucose-Capillary Latest Ref Range: 65 - 99 mg/dL 132 (H) 181 (H) 191 (H) 154 (H) 393 (H)   Results for SHAMERA, SPIEGELBERG (MRN [id number]) as of 02/07/2017 13:46  Ref. Range 01/30/2017 11:16  Hemoglobin A1C Latest Ref Range: 4.8 - 5.6 % 6.6 (H)   Review of Glycemic Control  Diabetes history: DM2 Outpatient Diabetes medications: Amaryl 4 mg QAM, Metformin 1000 mg BID Current orders for Inpatient glycemic control: Amaryl 4 mg QAM, Metformin 1000 mg BID  Inpatient Diabetes Program Recommendations: Correction (SSI): While inpatient, please consider ordering CBGs with Novolog correction scale ACHS.  Thanks, Barnie Alderman, RN, MSN, CDE Diabetes Coordinator Inpatient Diabetes Program [phone] (Team Pager from 8am to 5pm)

## 2017-02-07 NOTE — Clinical Social Work Note (Signed)
Clinical Social Work Assessment  Patient Details  Name: [name] MRN: [id number] Date of Birth: 03-18-1951  Date of referral:  02/07/17               Reason for consult:  Facility Placement                Permission sought to share information with:    Permission granted to share information::     Name::        Agency::     Relationship::     Contact Information:     Housing/Transportation Living arrangements for the past 2 months:  Single Family Home Source of Information:  Patient Patient Interpreter Needed:  None Criminal Activity/Legal Involvement Pertinent to Current Situation/Hospitalization:  No - Comment as needed Significant Relationships:  Adult Children Lives with:  Self Do you feel safe going back to the place where you live?    Need for family participation in patient care:  Yes (Comment)  Care giving concerns:  Pt's son present at bedside during initial assessment. Pt's son serves as a a support.   Social Worker assessment / plan:  CSW spoke with pt at bedside to complete initial assessment. Pt lives home alone. Pt is agreeable to SNF placement at this time. Pt prefers to go to [facility]. CSW will initiate bed search and follow up with pt once bed availability determined.  Employment status:  Retired Nurse, adult PT Recommendations:  [place] / Referral to community resources:  [place]  Patient/Family's Response to care:  Pt verbalized understanding of CSW role and expressed appreciation for support. Pt denies any concern regarding pt care at this time.  Patient/Family's Understanding of and Emotional Response to Diagnosis, Current Treatment, and Prognosis:  Pt understanding and realistic regarding physical limitations. Pt understands the need for SNF placement at d/c. Pt agreeable to SNF placement at d/c, at this time. Pt's responses emotionally appropriate during conversation with CSW.  Pt denies any concern regarding treatment plan at this time. CSW will continue to provide support and facilitate d/c needs.   Emotional Assessment Appearance:  Appears stated age Attitude/Demeanor/Rapport:   (Patient was appropriate) Affect (typically observed):  Accepting, Appropriate, Calm Orientation:  Oriented to Self, Oriented to Place, Oriented to  Time, Oriented to Situation Alcohol / Substance use:  Not Applicable Psych involvement (Current and /or in the community):  No (Comment)  Discharge Needs  Concerns to be addressed:  No discharge needs identified Readmission within the last 30 days:  No Current discharge risk:  Dependent with Mobility Barriers to Discharge:  Continued Medical Work up   [employer], LCSW 02/07/2017, 11:36 AM

## 2017-02-07 NOTE — Clinical Social Work Placement (Signed)
   CLINICAL SOCIAL WORK PLACEMENT  NOTE  Date:  02/07/2017  Patient Details  Name: [name] MRN: [id number] Date of Birth: May 31, 1951  Clinical Social Work is seeking post-discharge placement for this patient at the [place] level of care (*CSW will initial, date and re-position this form in  chart as items are completed):      Patient/family provided with [place] Work Department's list of facilities offering this level of care within the geographic area requested by the patient (or if unable, by the patient's family).      Patient/family informed of their freedom to choose among providers that offer the needed level of care, that participate in Medicare, Medicaid or managed care program needed by the patient, have an available bed and are willing to accept the patient.      Patient/family informed of [place]'s ownership interest in [facility] and [facility], as well as of the fact that they are under no obligation to receive care at these facilities.  PASRR submitted to EDS on       PASRR number received on 02/07/17     Existing PASRR number confirmed on       FL2 transmitted to all facilities in geographic area requested by pt/family on 02/07/17     FL2 transmitted to all facilities within larger geographic area on       Patient informed that his/her managed care company has contracts with or will negotiate with certain facilities, including the following:        Yes   Patient/family informed of bed offers received.  Patient chooses bed at [facility]     Physician recommends and patient chooses bed at      Patient to be transferred to [facility] on 02/09/17.  Patient to be transferred to facility by PTAR     Patient family notified on 02/09/17 of transfer.  Name of family member notified:        PHYSICIAN Please prepare priority discharge summary, including medications, Please  prepare prescriptions     Additional Comment:    _______________________________________________ Alla German, LCSW 02/07/2017, 11:39 AM

## 2017-02-08 DIAGNOSIS — M21162 Varus deformity, not elsewhere classified, left knee: Secondary | ICD-10-CM | POA: Diagnosis not present

## 2017-02-08 DIAGNOSIS — E119 Type 2 diabetes mellitus without complications: Secondary | ICD-10-CM | POA: Diagnosis not present

## 2017-02-08 DIAGNOSIS — D62 Acute posthemorrhagic anemia: Secondary | ICD-10-CM | POA: Diagnosis not present

## 2017-02-08 DIAGNOSIS — Z882 Allergy status to sulfonamides status: Secondary | ICD-10-CM | POA: Diagnosis not present

## 2017-02-08 DIAGNOSIS — Z7984 Long term (current) use of oral hypoglycemic drugs: Secondary | ICD-10-CM | POA: Diagnosis not present

## 2017-02-08 DIAGNOSIS — Z791 Long term (current) use of non-steroidal anti-inflammatories (NSAID): Secondary | ICD-10-CM | POA: Diagnosis not present

## 2017-02-08 DIAGNOSIS — Z79899 Other long term (current) drug therapy: Secondary | ICD-10-CM | POA: Diagnosis not present

## 2017-02-08 DIAGNOSIS — I10 Essential (primary) hypertension: Secondary | ICD-10-CM | POA: Diagnosis not present

## 2017-02-08 DIAGNOSIS — M1711 Unilateral primary osteoarthritis, right knee: Secondary | ICD-10-CM | POA: Diagnosis not present

## 2017-02-08 DIAGNOSIS — M1712 Unilateral primary osteoarthritis, left knee: Secondary | ICD-10-CM | POA: Diagnosis not present

## 2017-02-08 DIAGNOSIS — M2540 Effusion, unspecified joint: Secondary | ICD-10-CM | POA: Diagnosis not present

## 2017-02-08 DIAGNOSIS — H9192 Unspecified hearing loss, left ear: Secondary | ICD-10-CM | POA: Diagnosis not present

## 2017-02-08 DIAGNOSIS — K219 Gastro-esophageal reflux disease without esophagitis: Secondary | ICD-10-CM | POA: Diagnosis not present

## 2017-02-08 LAB — CBC
HCT: 29.9 % — ABNORMAL LOW (ref 36.0–46.0)
Hemoglobin: 9.1 g/dL — ABNORMAL LOW (ref 12.0–15.0)
MCH: 29 pg (ref 26.0–34.0)
MCHC: 30.4 g/dL (ref 30.0–36.0)
MCV: 95.2 fL (ref 78.0–100.0)
Platelets: 273 10*3/uL (ref 150–400)
RBC: 3.14 MIL/uL — ABNORMAL LOW (ref 3.87–5.11)
RDW: 14.7 % (ref 11.5–15.5)
WBC: 11.4 10*3/uL — ABNORMAL HIGH (ref 4.0–10.5)

## 2017-02-08 LAB — GLUCOSE, CAPILLARY
Glucose-Capillary: 197 mg/dL — ABNORMAL HIGH (ref 65–99)
Glucose-Capillary: 197 mg/dL — ABNORMAL HIGH (ref 65–99)
Glucose-Capillary: 220 mg/dL — ABNORMAL HIGH (ref 65–99)

## 2017-02-08 LAB — CBC AND DIFFERENTIAL
HCT: 30 % — AB (ref 36–46)
Hemoglobin: 9.1 g/dL — AB (ref 12.0–16.0)
Platelets: 273 10*3/uL (ref 150–399)
WBC: 11.4 10^3/mL

## 2017-02-08 NOTE — Progress Notes (Signed)
Occupational Therapy Treatment Patient Details Name: [name] MRN: [id number] DOB: 18-Feb-1951 Today's Date: 02/08/2017    History of present illness 66 y/o female s/p L TKA on 02/06/17. PMH: HTN, diabetes, obesity.    OT comments  Pt progressing towards acute OT goals. Focus of session was toilet transfer and LB ADL education. D/c plan remains appropriate.  Follow Up Recommendations  SNF;Supervision/Assistance - 24 hour    Equipment Recommendations  Other (comment)    Recommendations for Other Services      Precautions / Restrictions Precautions Precautions: Fall;Knee Precaution Comments: reviewed position of knee Restrictions Weight Bearing Restrictions: Yes LLE Weight Bearing: Weight bearing as tolerated       Mobility Bed Mobility               General bed mobility comments: sitting in chiar upon arrival  Transfers Overall transfer level: Needs assistance Equipment used: Rolling walker (2 wheeled) Transfers: Sit to/from Stand Sit to Stand: Min assist         General transfer comment: cues for hand placement. from recliner and 3n1    Balance Overall balance assessment: Needs assistance         Standing balance support: Bilateral upper extremity supported Standing balance-Leahy Scale: Poor Standing balance comment: using rw                   ADL Overall ADL's : Needs assistance/impaired                         Toilet Transfer: Minimal assistance;Ambulation;RW (3n1 over toilet) Toilet Transfer Details (indicate cue type and reason): cues for technique with rw         Functional mobility during ADLs: Min guard;Minimal assistance;Rolling walker        Vision                     Perception     Praxis      Cognition   Behavior During Therapy: WFL for tasks assessed/performed Overall Cognitive Status: Within Functional Limits for tasks assessed                         Exercises     Shoulder  Instructions       General Comments      Pertinent Vitals/ Pain       Pain Assessment: Faces Faces Pain Scale: Hurts little more Pain Location: Left Knee Pain Descriptors / Indicators: Throbbing Pain Intervention(s): Limited activity within patient's tolerance;Monitored during session;Repositioned  Home Living                                          Prior Functioning/Environment              Frequency  Min 2X/week        Progress Toward Goals  OT Goals(current goals can now be found in the care plan section)  Progress towards OT goals: Progressing toward goals  Acute Rehab OT Goals Patient Stated Goal: Go to SNF Rehab  Time For Goal Achievement: 02/21/17 Potential to Achieve Goals: Good ADL Goals Pt Will Perform Grooming: standing;with supervision Pt Will Perform Lower Body Dressing: with supervision;with adaptive equipment;sitting/lateral leans;sit to/from stand Pt Will Transfer to Toilet: with supervision;ambulating;bedside commode;grab bars Additional ADL Goal #1: Pt will I'ly state 2-3 energy  conservation techniques and apply during ADL's in preparation for increased independence in this area.  Plan Discharge plan remains appropriate    Co-evaluation                 End of Session Equipment Utilized During Treatment: Gait belt;Rolling walker  OT Visit Diagnosis: Other abnormalities of gait and mobility (R26.89);Pain Pain - Right/Left: Left Pain - part of body: Knee   Activity Tolerance Patient tolerated treatment well   Patient Left in chair;with call bell/phone within reach;with family/visitor present   Nurse Communication          Time: 1212-1229 OT Time Calculation (min): 17 min  Charges: OT General Charges $OT Visit: 1 Procedure OT Treatments $Self Care/Home Management : 8-22 mins     Hortencia Pilar 02/08/2017, 3:05 PM

## 2017-02-08 NOTE — Progress Notes (Signed)
Orthopedic Tech Progress Note Patient Details:  [name] 13-Jan-1951 [id number]  Patient ID: [name], female   DOB: 02-10-1951, 66 y.o.   MRN: [id number] Pt wants to wait till after breakfast to get in cpm will call.  Karolee Stamps 02/08/2017, 6:10 AM

## 2017-02-08 NOTE — Progress Notes (Signed)
Physical Therapy Treatment Patient Details Name: [name] MRN: [id number] DOB: Aug 23, 1951 Today's Date: 02/08/2017    History of Present Illness 66 y/o female s/p L TKA on 02/06/17. PMH: HTN, diabetes, obesity.     PT Comments    Pt making gradual progress with mobility. Able to ambulate 25 ft with rw with slow pattern and continued min guard assist. Based upon the patient's current mobility, recommending SNF for further rehabilitation following acute stay.    Follow Up Recommendations  SNF     Equipment Recommendations   (to be assessed at next venue)    Recommendations for Other Services       Precautions / Restrictions Precautions Precautions: Fall;Knee Precaution Comments: reviewed position of knee Restrictions Weight Bearing Restrictions: Yes LLE Weight Bearing: Weight bearing as tolerated    Mobility  Bed Mobility               General bed mobility comments: sitting in chiar upon arrival  Transfers Overall transfer level: Needs assistance Equipment used: Rolling walker (2 wheeled) Transfers: Sit to/from Stand Sit to Stand: Min assist         General transfer comment: cues for hand placement  Ambulation/Gait Ambulation/Gait assistance: Min guard Ambulation Distance (Feet): 25 Feet Assistive device: Rolling walker (2 wheeled) Gait Pattern/deviations: Step-to pattern;Decreased weight shift to left;Decreased stance time - left Gait velocity: decreased   General Gait Details: slow pattern, reinforcing WBAT status   Stairs            Wheelchair Mobility    Modified Rankin (Stroke Patients Only)       Balance Overall balance assessment: Needs assistance Sitting-balance support: No upper extremity supported Sitting balance-Leahy Scale: Good     Standing balance support: Bilateral upper extremity supported Standing balance-Leahy Scale: Poor Standing balance comment: using rw                    Cognition  Arousal/Alertness: Awake/alert Behavior During Therapy: WFL for tasks assessed/performed Overall Cognitive Status: Within Functional Limits for tasks assessed                      Exercises Total Joint Exercises Ankle Circles/Pumps: AROM;Both;15 reps Quad Sets: Strengthening;Left;10 reps Heel Slides: AAROM;Left;10 reps Hip ABduction/ADduction: Strengthening;Left;10 reps Straight Leg Raises: Strengthening;Left;10 reps (mod assist) Goniometric ROM: 55 degrees knee flexion    General Comments        Pertinent Vitals/Pain Pain Assessment: No/denies pain Pain Score: 5  Faces Pain Scale: Hurts little more Pain Location: Left Knee Pain Descriptors / Indicators: Aching Pain Intervention(s): Limited activity within patient's tolerance;Monitored during session;Ice applied    Home Living                      Prior Function            PT Goals (current goals can now be found in the care plan section) Acute Rehab PT Goals Patient Stated Goal: keep moving PT Goal Formulation: With patient Time For Goal Achievement: 02/21/17 Potential to Achieve Goals: Good Progress towards PT goals: Progressing toward goals    Frequency    7X/week      PT Plan Current plan remains appropriate    Co-evaluation             End of Session Equipment Utilized During Treatment: Gait belt Activity Tolerance: Patient tolerated treatment well Patient left: in bed;with call bell/phone within reach;in CPM;with family/visitor present Nurse Communication: Mobility  status PT Visit Diagnosis: Unsteadiness on feet (R26.81);Muscle weakness (generalized) (M62.81);Difficulty in walking, not elsewhere classified (R26.2)     Time: [id number] PT Time Calculation (min) (ACUTE ONLY): 32 min  Charges:  $Gait Training: 8-22 mins $Therapeutic Exercise: 8-22 mins                    G Codes:       Cassell Clement, PT, CSCS Pager [phone] Office [phone]  02/08/2017, 4:10  PM

## 2017-02-08 NOTE — Progress Notes (Signed)
Subjective: 2 Days Post-Op Procedure(s) (LRB): LEFT TOTAL KNEE ARTHROPLASTY (Left) Patient reports pain as moderate.  States had severe pain earlier today. Eating, drinking and voiding well.   Objective: Vital signs in last 24 hours: Temp:  [98.2 F (36.8 C)-99.6 F (37.6 C)] 99.2 F (37.3 C) (02/22 0722) Pulse Rate:  [105-121] 121 (02/22 0722) Resp:  [18] 18 (02/21 2004) BP: (101-103)/(58-65) 103/58 (02/22 0722) SpO2:  [92 %-100 %] 100 % (02/22 0722)  Intake/Output from previous day: 02/21 0701 - 02/22 0700 In: [id number] [P.O.:360; I.V.:1355] Out: 250 [Urine:250] Intake/Output this shift: Total I/O In: 240 [P.O.:240] Out: -    Recent Labs  02/07/17 0443 02/08/17 0708  HGB 9.3* 9.1*    Recent Labs  02/07/17 0443 02/08/17 0708  WBC 7.7 11.4*  RBC 3.19* 3.14*  HCT 30.4* 29.9*  PLT 262 273    Recent Labs  02/07/17 0443  NA 137  K 4.3  CL 102  CO2 26  BUN 24*  CREATININE 1.20*  GLUCOSE 146*  CALCIUM 8.4*   No results for input(s): LABPT, INR in the last 72 hours. PE: Left lower Extremity: Dorsi/plantar flexion left ankle intact Calf supple and non tender Dressing clean dry and intact    Assessment/Plan: 2 Days Post-Op Procedure(s) (LRB): LEFT TOTAL KNEE ARTHROPLASTY (Left) Up with therapy Plan for discharge tomorrow to home   [facility] 02/08/2017, 12:07 PM

## 2017-02-09 DIAGNOSIS — M25569 Pain in unspecified knee: Secondary | ICD-10-CM | POA: Diagnosis not present

## 2017-02-09 DIAGNOSIS — D62 Acute posthemorrhagic anemia: Secondary | ICD-10-CM | POA: Diagnosis not present

## 2017-02-09 DIAGNOSIS — M1711 Unilateral primary osteoarthritis, right knee: Secondary | ICD-10-CM | POA: Diagnosis not present

## 2017-02-09 DIAGNOSIS — R531 Weakness: Secondary | ICD-10-CM | POA: Diagnosis not present

## 2017-02-09 DIAGNOSIS — M1990 Unspecified osteoarthritis, unspecified site: Secondary | ICD-10-CM | POA: Diagnosis not present

## 2017-02-09 DIAGNOSIS — E119 Type 2 diabetes mellitus without complications: Secondary | ICD-10-CM | POA: Diagnosis not present

## 2017-02-09 DIAGNOSIS — Z7984 Long term (current) use of oral hypoglycemic drugs: Secondary | ICD-10-CM | POA: Diagnosis not present

## 2017-02-09 DIAGNOSIS — K219 Gastro-esophageal reflux disease without esophagitis: Secondary | ICD-10-CM | POA: Diagnosis not present

## 2017-02-09 DIAGNOSIS — Z79899 Other long term (current) drug therapy: Secondary | ICD-10-CM | POA: Diagnosis not present

## 2017-02-09 DIAGNOSIS — I10 Essential (primary) hypertension: Secondary | ICD-10-CM | POA: Diagnosis not present

## 2017-02-09 DIAGNOSIS — M21162 Varus deformity, not elsewhere classified, left knee: Secondary | ICD-10-CM | POA: Diagnosis not present

## 2017-02-09 DIAGNOSIS — M2540 Effusion, unspecified joint: Secondary | ICD-10-CM | POA: Diagnosis not present

## 2017-02-09 DIAGNOSIS — Z791 Long term (current) use of non-steroidal anti-inflammatories (NSAID): Secondary | ICD-10-CM | POA: Diagnosis not present

## 2017-02-09 DIAGNOSIS — M6281 Muscle weakness (generalized): Secondary | ICD-10-CM | POA: Diagnosis not present

## 2017-02-09 DIAGNOSIS — M1712 Unilateral primary osteoarthritis, left knee: Secondary | ICD-10-CM | POA: Diagnosis not present

## 2017-02-09 DIAGNOSIS — Z882 Allergy status to sulfonamides status: Secondary | ICD-10-CM | POA: Diagnosis not present

## 2017-02-09 DIAGNOSIS — M1710 Unilateral primary osteoarthritis, unspecified knee: Secondary | ICD-10-CM | POA: Diagnosis not present

## 2017-02-09 DIAGNOSIS — H9192 Unspecified hearing loss, left ear: Secondary | ICD-10-CM | POA: Diagnosis not present

## 2017-02-09 DIAGNOSIS — E1169 Type 2 diabetes mellitus with other specified complication: Secondary | ICD-10-CM | POA: Diagnosis not present

## 2017-02-09 DIAGNOSIS — B372 Candidiasis of skin and nail: Secondary | ICD-10-CM | POA: Diagnosis not present

## 2017-02-09 DIAGNOSIS — Z471 Aftercare following joint replacement surgery: Secondary | ICD-10-CM | POA: Diagnosis not present

## 2017-02-09 DIAGNOSIS — Z96652 Presence of left artificial knee joint: Secondary | ICD-10-CM | POA: Diagnosis not present

## 2017-02-09 DIAGNOSIS — R600 Localized edema: Secondary | ICD-10-CM | POA: Diagnosis not present

## 2017-02-09 DIAGNOSIS — R488 Other symbolic dysfunctions: Secondary | ICD-10-CM | POA: Diagnosis not present

## 2017-02-09 DIAGNOSIS — R262 Difficulty in walking, not elsewhere classified: Secondary | ICD-10-CM | POA: Diagnosis not present

## 2017-02-09 LAB — CBC
HCT: 26.9 % — ABNORMAL LOW (ref 36.0–46.0)
Hemoglobin: 8.3 g/dL — ABNORMAL LOW (ref 12.0–15.0)
MCH: 28.8 pg (ref 26.0–34.0)
MCHC: 30.9 g/dL (ref 30.0–36.0)
MCV: 93.4 fL (ref 78.0–100.0)
Platelets: 234 10*3/uL (ref 150–400)
RBC: 2.88 MIL/uL — ABNORMAL LOW (ref 3.87–5.11)
RDW: 14.3 % (ref 11.5–15.5)
WBC: 11.1 10*3/uL — ABNORMAL HIGH (ref 4.0–10.5)

## 2017-02-09 LAB — CBC AND DIFFERENTIAL: WBC: 11.1 10^3/mL

## 2017-02-09 LAB — GLUCOSE, CAPILLARY: Glucose-Capillary: 191 mg/dL — ABNORMAL HIGH (ref 65–99)

## 2017-02-09 MED ORDER — METHOCARBAMOL 500 MG PO TABS: 500.0000 mg | ORAL_TABLET | Freq: Four times a day (QID) | ORAL | 0 refills | Status: DC | PRN

## 2017-02-09 MED ORDER — OXYCODONE-ACETAMINOPHEN 5-325 MG PO TABS: 1.0000 | ORAL_TABLET | ORAL | 0 refills | Status: DC | PRN

## 2017-02-09 MED ORDER — RIVAROXABAN 10 MG PO TABS
10.0000 mg | ORAL_TABLET | Freq: Every day | ORAL | 0 refills | Status: DC
Start: 2017-02-09 — End: 2017-02-27

## 2017-02-09 NOTE — Discharge Summary (Signed)
Patient ID: [name] MRN: [id number] DOB/AGE: July 28, 1951 66 y.o.  Admit date: 02/06/2017 Discharge date: 02/09/2017  Admission Diagnoses:  Principal Problem:   Unilateral primary osteoarthritis, left knee Active Problems:   Status post total left knee replacement   Discharge Diagnoses:  Same  Past Medical History:  Diagnosis Date  . Abdominal hernia   . Arthritis    knees, back, R ankle   . Diabetes mellitus   . Edema    legs  . GERD (gastroesophageal reflux disease)   . HOH (hard of hearing)   . Hypertension   . Obesity    morbid obesity    Surgeries: Procedure(s): LEFT TOTAL KNEE ARTHROPLASTY on 02/06/2017   Consultants:   Discharged Condition: Improved  Hospital Course: [name] is an 66 y.o. female who was admitted 02/06/2017 for operative treatment ofUnilateral primary osteoarthritis, left knee. Patient has severe unremitting pain that affects sleep, daily activities, and work/hobbies. After pre-op clearance the patient was taken to the operating room on 02/06/2017 and underwent  Procedure(s): LEFT TOTAL KNEE ARTHROPLASTY.    Patient was given perioperative antibiotics: Anti-infectives    Start     Dose/Rate Route Frequency Ordered Stop   02/06/17 2100  ceFAZolin (ANCEF) IVPB 2g/100 mL premix     2 g 200 mL/hr over 30 Minutes Intravenous Every 6 hours 02/06/17 2022 02/07/17 0859   02/06/17 1430  ceFAZolin (ANCEF) 3 g in dextrose 5 % 50 mL IVPB     3 g 130 mL/hr over 30 Minutes Intravenous To [facility] 02/05/17 1042 02/06/17 1525       Patient was given sequential compression devices, early ambulation, and chemoprophylaxis to prevent DVT.  Patient benefited maximally from hospital stay and there were no complications.    Recent vital signs: Patient Vitals for the past 24 hrs:  BP Temp Temp src Pulse Resp SpO2  02/09/17 0427 (!) 115/59 99.4 F (37.4 C) Oral (!) 110 18 97 %  02/08/17 2157 (!) 98/56 100 F (37.8 C) Oral (!) 115 16 98 %   02/08/17 1320 (!) 100/50 98.1 F (36.7 C) Oral (!) 108 - 100 %  02/08/17 0722 (!) 103/58 99.2 F (37.3 C) Oral (!) 121 - 100 %     Recent laboratory studies:  Recent Labs  02/07/17 0443 02/08/17 0708 02/09/17 0320  WBC 7.7 11.4* 11.1*  HGB 9.3* 9.1* 8.3*  HCT 30.4* 29.9* 26.9*  PLT 262 273 234  NA 137  --   --   K 4.3  --   --   CL 102  --   --   CO2 26  --   --   BUN 24*  --   --   CREATININE 1.20*  --   --   GLUCOSE 146*  --   --   CALCIUM 8.4*  --   --      Discharge Medications:   Allergies as of 02/09/2017      Reactions   Other    NO Blood or Blood products   Sulfa Antibiotics Rash      Medication List    STOP taking these medications   aspirin 81 MG tablet   traMADol 50 MG tablet Commonly known as:  ULTRAM     TAKE these medications   cholecalciferol 1000 units tablet Commonly known as:  VITAMIN D Take 1 tablet by mouth daily.   E-400 PO Take 1 capsule by mouth daily.   EQL OMEGA 3 FISH OIL 1000  MG Caps Take 1 capsule by mouth daily.   glimepiride 4 MG tablet Commonly known as:  AMARYL TAKE 1 TABLET EVERY DAY WITH BREAKFAST   losartan-hydrochlorothiazide 100-12.5 MG tablet Commonly known as:  HYZAAR Take 1 tablet by mouth daily.   metFORMIN 1000 MG tablet Commonly known as:  GLUCOPHAGE Take 1 tablet (1,000 mg total) by mouth 2 (two) times daily with a meal.   methocarbamol 500 MG tablet Commonly known as:  ROBAXIN Take 1 tablet (500 mg total) by mouth every 6 (six) hours as needed for muscle spasms.   multivitamin tablet Take 1 tablet by mouth daily.   naproxen sodium 220 MG tablet Commonly known as:  ANAPROX Take 440 mg by mouth 2 (two) times daily with a meal.   oxyCODONE-acetaminophen 5-325 MG tablet Commonly known as:  ROXICET Take 1-2 tablets by mouth every 4 (four) hours as needed.   ranitidine 150 MG tablet Commonly known as:  ZANTAC Take 1 tablet (150 mg total) by mouth 2 (two) times daily.   rivaroxaban 10 MG Tabs  tablet Commonly known as:  XARELTO Take 1 tablet (10 mg total) by mouth daily with breakfast.            Durable Medical Equipment        Start     Ordered   02/06/17 2023  DME Walker rolling  Once    Question:  Patient needs a walker to treat with the following condition  Answer:  Status post total left knee replacement   02/06/17 2022   02/06/17 2023  DME 3 n 1  Once     02/06/17 2022      Diagnostic Studies: Dg Knee Left Port  Result Date: 02/06/2017 CLINICAL DATA:  Postoperative radiograph. Status post left total knee arthroplasty. Initial encounter. EXAM: PORTABLE LEFT KNEE - 1-2 VIEW COMPARISON:  Left knee radiographs performed 01/10/2017 FINDINGS: The patient's left total knee arthroplasty is grossly unremarkable in appearance, without evidence of loosening or new fracture. Postoperative soft tissue air is seen, tracking into the joint space. Postoperative lucency is noted at the remaining proximal medial tibia. Overlying skin staples and soft tissue swelling are noted. A Pellegrini-Stieda lesion is noted, reflecting prior medial collateral ligament injury. IMPRESSION: No evidence of loosening or new fracture. Left total knee arthroplasty is grossly unremarkable in appearance. Electronically Signed   By: Garald Balding M.D.   On: 02/06/2017 20:08   Xr Knee 1-2 Views Left  Result Date: 01/10/2017 An AP and lateral of her left knee shows profound end-stage arthritis. There is complete loss of the entire joint space and a varus deformity.  Xr Knee 1-2 Views Right  Result Date: 01/10/2017 An AP and lateral of the right knee show severe end-stage arthritis and degenerative joint disease. There is loss of joint space and significant varus deformity.   Disposition: to skilled nursing  Discharge Instructions    Discharge patient    Complete by:  As directed    Discharge disposition:  03-Skilled [place]   Discharge patient date:  02/09/2017       Contact information  for follow-up providers    Mcarthur Rossetti, MD Follow up in 2 week(s).   Specialty:  Orthopedic Surgery Contact information: [place] [address] [phone]            Contact information for after-discharge care    Destination    [facility] Follow up.   Specialty:  Skilled Nursing Facility Contact information: [address] [place] [phone]                   Signed: Mcarthur Rossetti 02/09/2017, 6:58 AM

## 2017-02-09 NOTE — Progress Notes (Signed)
Patient ID: [name], female   DOB: 1951-09-24, 66 y.o.   MRN: [id number] Doing well overall.  Can be discharged to skilled nursing today.

## 2017-02-09 NOTE — Clinical Social Work Note (Signed)
Clinical Social Worker facilitated patient discharge including contacting patient family and facility to confirm patient discharge plans.  Clinical information faxed to facility and family agreeable with plan.  CSW arranged ambulance transport via [facility] to [facility].  RN to call [phone] for report prior to discharge. Patient will go to room 104.  Clinical Social Worker will sign off for now as social work intervention is no longer needed. Please consult [place] again if new need arises.  [address] [place]

## 2017-02-09 NOTE — Progress Notes (Signed)
Discharge instructions printed and reviewed with patient and family, and copy given for them to take home. All questions addressed at this time. New prescriptions to be sent with her to [facility]. Room searched for patient belongings, and confirmed with patient that all valuables were accounted for. Called report to nurse [facility] at [facility], reviewed hospital course and past medical history. Informed them that we expect [facility] transportation to arrive around 2:30PM. Will continue to monitor patient.

## 2017-02-12 ENCOUNTER — Non-Acute Institutional Stay (SKILLED_NURSING_FACILITY): Payer: Medicare Other | Admitting: Internal Medicine

## 2017-02-12 ENCOUNTER — Encounter: Payer: Self-pay | Admitting: Internal Medicine

## 2017-02-12 DIAGNOSIS — I10 Essential (primary) hypertension: Secondary | ICD-10-CM

## 2017-02-12 DIAGNOSIS — D62 Acute posthemorrhagic anemia: Secondary | ICD-10-CM

## 2017-02-12 DIAGNOSIS — M1710 Unilateral primary osteoarthritis, unspecified knee: Secondary | ICD-10-CM

## 2017-02-12 DIAGNOSIS — Z96652 Presence of left artificial knee joint: Secondary | ICD-10-CM | POA: Diagnosis not present

## 2017-02-12 DIAGNOSIS — E669 Obesity, unspecified: Secondary | ICD-10-CM

## 2017-02-12 DIAGNOSIS — K219 Gastro-esophageal reflux disease without esophagitis: Secondary | ICD-10-CM | POA: Diagnosis not present

## 2017-02-12 DIAGNOSIS — E1169 Type 2 diabetes mellitus with other specified complication: Secondary | ICD-10-CM | POA: Diagnosis not present

## 2017-02-12 DIAGNOSIS — M1712 Unilateral primary osteoarthritis, left knee: Secondary | ICD-10-CM

## 2017-02-12 DIAGNOSIS — E559 Vitamin D deficiency, unspecified: Secondary | ICD-10-CM

## 2017-02-12 DIAGNOSIS — IMO0002 Reserved for concepts with insufficient information to code with codable children: Secondary | ICD-10-CM

## 2017-02-12 NOTE — Progress Notes (Signed)
: Provider:  Noah Delaine. Sheppard Coil, MD Location:  [place] Room Number: [id number] Place of Service:  SNF (31)  PCP: Harland Dingwall, NP Patient Care Team: Girtha Rm, NP as PCP - General (Nurse Practitioner) Burnell Blanks, MD as Consulting Physician (Cardiology) Burnell Blanks, MD as Consulting Physician (Cardiology) Kathyrn Lass, MD (Family Medicine)  Extended Emergency Contact Information Primary Emergency Contact: Diggs,Helen  Johnnette Litter of [place] Phone: [phone] Relation: Sister Secondary Emergency Contact: Nichola Sizer of [place] Phone: [phone] Relation: Sister     Allergies: Other and Sulfa antibiotics  Chief Complaint  Patient presents with  . New Admit To SNF    Admit to Facility    HPI: Patient is 66 y.o. female with HTN, DM2 and GERD who was admitted to [facility] form 2/20-23 for a L knee arthroplasty 2/2 OA with unremitting pain that affected rest waking, working and sleeping. Course was unremarkable except for acute post op anemia, that did not require a transfusion. Pt is admitted to SNF for OT/PT. While at SNF pt will be followed for DM2, tx with amaryl, and metformen, HTN, tx with Hyzaar and GERD, tc with zantac.  Past Medical History:  Diagnosis Date  . Abdominal hernia   . Arthritis    knees, back, R ankle   . Diabetes mellitus   . Edema    legs  . GERD (gastroesophageal reflux disease)   . HOH (hard of hearing)   . Hypertension   . Obesity    morbid obesity    Past Surgical History:  Procedure Laterality Date  . [place]?   right  . CARPAL TUNNEL RELEASE Left 07/10/2014   Procedure: LEFT ENDOSCOPIC CARPAL TUNNEL RELEASE ;  Surgeon: Jolyn Nap, MD;  Location: [place];  Service: Orthopedics;  Laterality: Left;  . COLONOSCOPY    . HERNIA REPAIR  07/03/2011   abd hernia  . NOSE SURGERY  2008   sinus  . TOTAL KNEE ARTHROPLASTY  Left 02/06/2017   Procedure: LEFT TOTAL KNEE ARTHROPLASTY;  Surgeon: Mcarthur Rossetti, MD;  Location: [place];  Service: Orthopedics;  Laterality: Left;  . TUBAL LIGATION      Allergies as of 02/12/2017      Reactions   Other    NO Blood or Blood products   Sulfa Antibiotics Rash      Medication List       Accurate as of 02/12/17  9:05 AM. Always use your most recent med list.          cholecalciferol 1000 units tablet Commonly known as:  VITAMIN D Take 1 tablet by mouth daily.   E-400 PO Take 1 capsule by mouth daily.   EQL OMEGA 3 FISH OIL 1000 MG Caps Take 1 capsule by mouth daily.   glimepiride 4 MG tablet Commonly known as:  AMARYL TAKE 1 TABLET EVERY DAY WITH BREAKFAST   losartan-hydrochlorothiazide 100-12.5 MG tablet Commonly known as:  HYZAAR Take 1 tablet by mouth daily.   metFORMIN 1000 MG tablet Commonly known as:  GLUCOPHAGE Take 1 tablet (1,000 mg total) by mouth 2 (two) times daily with a meal.   methocarbamol 500 MG tablet Commonly known as:  ROBAXIN Take 1 tablet (500 mg total) by mouth every 6 (six) hours as needed for muscle spasms.   multivitamin tablet Take 1 tablet by mouth daily.   naproxen sodium 220 MG tablet Commonly known as:  ANAPROX Take 440 mg by mouth 2 (two) times daily with a meal.   oxyCODONE 5 MG immediate release tablet Commonly known as:  Oxy IR/ROXICODONE Take 5-10 mg by mouth every 6 (six) hours as needed for moderate pain or severe pain. 1 - 2 tablets   ranitidine 150 MG tablet Commonly known as:  ZANTAC Take 1 tablet (150 mg total) by mouth 2 (two) times daily.   rivaroxaban 10 MG Tabs tablet Commonly known as:  XARELTO Take 1 tablet (10 mg total) by mouth daily with breakfast.       Meds ordered this encounter  Medications  . oxyCODONE (OXY IR/ROXICODONE) 5 MG immediate release tablet    Sig: Take 5-10 mg by mouth every 6 (six) hours as needed for moderate pain or severe pain. 1 - 2 tablets     Immunization History  Administered Date(s) Administered  . Influenza,inj,Quad PF,36+ Mos 09/21/2016  . PPD Test 02/09/2017  . Pneumococcal Polysaccharide-23 08/01/2006  . Td 12/21/2003    Social History  Substance Use Topics  . Smoking status: Never Smoker  . Smokeless tobacco: Never Used  . Alcohol use No    Family history is   Family History  Problem Relation Age of Onset  . Hypertension Mother   . Kidney disease Mother   . Diabetes Father   . Hypertension Father   . Diabetes Sister   . Hypertension Sister   . Diabetes Brother   . Hypertension Brother   . Heart disease Brother 30    heart attack  . Hypertension Sister       Review of Systems  DATA OBTAINED: from patient, nurse GENERAL:  no fevers, fatigue, appetite changes SKIN: No itching, or rash EYES: No eye pain, redness, discharge EARS: No earache, tinnitus, change in hearing NOSE: No congestion, drainage or bleeding  MOUTH/THROAT: No mouth or tooth pain, No sore throat RESPIRATORY: No cough, wheezing, SOB CARDIAC: No chest pain, palpitations, lower extremity edema  GI: No abdominal pain, No N/V/D or constipation, No heartburn or reflux  GU: No dysuria, frequency or urgency, or incontinence  MUSCULOSKELETAL: No unrelieved bone/joint pain NEUROLOGIC: No headache, dizziness or focal weakness PSYCHIATRIC: No c/o anxiety or sadness   Vitals:   02/12/17 0854  BP: 112/66  Pulse: 83  Resp: 18  Temp: 97.9 F (36.6 C)    SpO2 Readings from Last 1 Encounters:  02/12/17 98%   Body mass index is 51 kg/m.     Physical Exam  GENERAL APPEARANCE: Alert, conversant,  No acute distress, obese  SKIN: No diaphoresis rash;no unusual heat or swelling to incision area HEAD: Normocephalic, atraumatic  EYES: Conjunctiva/lids clear. Pupils round, reactive. EOMs intact.  EARS: External exam WNL, canals clear. Hearing grossly normal.  NOSE: No deformity or discharge.  MOUTH/THROAT: Lips w/o lesions   RESPIRATORY: Breathing is even, unlabored. Lung sounds are clear   CARDIOVASCULAR: Heart RRR no murmurs, rubs or gallops. No peripheral edema.   GASTROINTESTINAL: Abdomen is soft, non-tender, not distended w/ normal bowel sounds. GENITOURINARY: Bladder non tender, not distended  MUSCULOSKELETAL: no unusual swelling to L arthrooplasty NEUROLOGIC:  Cranial nerves 2-12 grossly intact. Moves all extremities  PSYCHIATRIC: Mood and affect appropriate to situation, no behavioral issues  Patient Active Problem List   Diagnosis Date Noted  . 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 Unilateral primary osteoarthritis, right knee 01/10/2017  . Unilateral primary osteoarthritis, left knee 01/10/2017  . Subclinical hypothyroidism 10/11/2016  . Decreased hearing of left  ear 10/11/2016  . Hypertension   . Diabetes mellitus type 2 in obese ([place]) 12/31/2015  . Hypertriglyceridemia 08/30/2015  . Dyspnea 04/02/2013  . Cough 02/12/2013  . Abdominal pain, left lateral 04/03/2012  . Edema 08/22/2011  . Tachycardia 08/22/2011  . Diabetes ([place]) 10/16/2007  . OBESITY, MORBID 10/16/2007  . Essential hypertension 10/16/2007  . Osteoarthrosis, unspecified whether generalized or localized, involving lower leg 10/16/2007      Labs reviewed: Basic Metabolic Panel:    Component Value Date/Time   NA 137 02/07/2017 0443   NA 137 02/07/2017   K 4.3 02/07/2017 0443   CL 102 02/07/2017 0443   CO2 26 02/07/2017 0443   GLUCOSE 146 (H) 02/07/2017 0443   BUN 24 (H) 02/07/2017 0443   BUN 24 (A) 02/07/2017   CREATININE 1.20 (H) 02/07/2017 0443   CREATININE 1.17 (H) 11/08/2016 0747   CALCIUM 8.4 (L) 02/07/2017 0443   PROT 7.0 09/29/2016 0811   ALBUMIN 3.8 09/29/2016 0811   AST 8 (L) 09/29/2016 0811   ALT 6 09/29/2016 0811   ALKPHOS 68 09/29/2016 0811   BILITOT 0.4 09/29/2016 0811   GFRNONAA 47 (L) 02/07/2017 0443   GFRNONAA 52 (L) 09/29/2016 0811   GFRAA 55 (L) 02/07/2017 0443   GFRAA 60  09/29/2016 0811     Recent Labs  11/08/16 0747 01/30/17 1117 02/07/17 02/07/17 0443  NA 140 139 137 137  K 4.8 4.6  --  4.3  CL 104 106  --  102  CO2 25 22  --  26  GLUCOSE 126* 161*  --  146*  BUN 19 22* 24* 24*  CREATININE 1.17* 1.16* 1.2* 1.20*  CALCIUM 9.2 9.5  --  8.4*   Liver Function Tests:  Recent Labs  07/05/16 0740 09/29/16 0811  AST 9* 8*  ALT 8 6  ALKPHOS 66 68  BILITOT 0.3 0.4  PROT 7.0 7.0  ALBUMIN 3.9 3.8   No results for input(s): LIPASE, AMYLASE in the last 8760 hours. No results for input(s): AMMONIA in the last 8760 hours. CBC:  Recent Labs  07/05/16 0740 09/29/16 0811  02/07/17 0443 02/08/17 02/08/17 0708 02/09/17 02/09/17 0320  WBC 6.7 7.1  < > 7.7 11.4 11.4* 11.1 11.1*  NEUTROABS 5,092 4,899  --   --   --   --   --   --   HGB 11.4* 11.4*  < > 9.3* 9.1* 9.1*  --  8.3*  HCT 36.3 36.1  < > 30.4* 30* 29.9*  --  26.9*  MCV 92.4 92.6  < > 95.3  --  95.2  --  93.4  PLT 310 293  < > 262 273 273  --  234  < > = values in this interval not displayed. Lipid  Recent Labs  07/05/16 0740 09/29/16 0811  CHOL 137 146  HDL 41* 30*  LDLCALC 64 69  TRIG 158* 237*    Cardiac Enzymes: No results for input(s): CKTOTAL, CKMB, CKMBINDEX, TROPONINI in the last 8760 hours. BNP: No results for input(s): BNP in the last 8760 hours. Lab Results  Component Value Date   MICROALBUR 0.4 10/11/2016   Lab Results  Component Value Date   HGBA1C 6.6 (H) 01/30/2017   Lab Results  Component Value Date   TSH 4.05 11/08/2016   No results found for: VITAMINB12 No results found for: FOLATE No results found for: IRON, TIBC, FERRITIN  Imaging and Procedures obtained prior to SNF admission: No results found.   Not all  labs, radiology exams or other studies done during hospitalization come through on my EPIC note; however they are reviewed by me.    Assessment and Plan  OA L KNEE; S/P L KNEE ARTHROPLASTY - no complications; prophylaxis with  xarelto SNF - admitted to SNF for OT/PT; cont xarelto for prophylaxis  POST-OP ACUTE BLOOD LOSS ANEMIA post-op HB 8.3 SNF - will f/u CBC  DM2 SNF - not stated as uncontrolled; plan to cont metformin 1000mg  BID and amaryl 4 mg daily ;   HTN SNF - controlled; cont hyzaar 100-12.5 mg daily  GERD SNF - not stated as uncontrolled; cont zantac 150 mg BID  VIT D DEF SNF - plan to cont replacement 1000 u daily   Time spent > 45 min;mg  Tramane Gorum D. Sheppard Coil, MD

## 2017-02-14 ENCOUNTER — Other Ambulatory Visit: Payer: Self-pay | Admitting: *Deleted

## 2017-02-14 LAB — BASIC METABOLIC PANEL
BUN: 21 mg/dL (ref 4–21)
Creatinine: 0.9 mg/dL (ref 0.5–1.1)
Glucose: 224 mg/dL
Potassium: 5.1 mmol/L (ref 3.4–5.3)
Sodium: 142 mmol/L (ref 137–147)

## 2017-02-14 LAB — CBC AND DIFFERENTIAL
HCT: 31 % — AB (ref 36–46)
Hemoglobin: 9.4 g/dL — AB (ref 12.0–16.0)
Platelets: 342 10*3/uL (ref 150–399)
WBC: 8.1 10^3/mL

## 2017-02-14 NOTE — Patient Outreach (Signed)
[facility]) Care Management  02/14/2017  [name] 09-05-1951 [id number]   Met with patient, daughter and grandson at facility.  Patient reports she is there for just a couple of weeks for rehab post TKR. She reports she will have her other TKR in June.   Patient reports she is independent and lives alone. She denies any [facility] care management needs at this time but did accept a brochure for future reference.   Plan to sign off, administrator and business office aware patient is eligible for [facility], still no SW at facility.   Royetta Crochet. Laymond Purser, RN, BSN, [place] [phone]) Business Cell  [phone]) Toll Free Office

## 2017-02-16 ENCOUNTER — Encounter: Payer: Self-pay | Admitting: Internal Medicine

## 2017-02-16 DIAGNOSIS — K219 Gastro-esophageal reflux disease without esophagitis: Secondary | ICD-10-CM | POA: Insufficient documentation

## 2017-02-16 DIAGNOSIS — D62 Acute posthemorrhagic anemia: Secondary | ICD-10-CM | POA: Insufficient documentation

## 2017-02-16 DIAGNOSIS — E559 Vitamin D deficiency, unspecified: Secondary | ICD-10-CM | POA: Insufficient documentation

## 2017-02-16 HISTORY — DX: Acute posthemorrhagic anemia: D62

## 2017-02-16 HISTORY — DX: Vitamin D deficiency, unspecified: E55.9

## 2017-02-19 ENCOUNTER — Encounter: Payer: Self-pay | Admitting: Internal Medicine

## 2017-02-19 ENCOUNTER — Non-Acute Institutional Stay (SKILLED_NURSING_FACILITY): Payer: Medicare Other | Admitting: Internal Medicine

## 2017-02-19 DIAGNOSIS — B372 Candidiasis of skin and nail: Secondary | ICD-10-CM

## 2017-02-19 NOTE — Progress Notes (Signed)
Location:  [place] Room Number: [id number] Place of Service:  SNF (31)  [name]. Sheppard Coil, MD  Patient Care Team: Girtha Rm, NP as PCP - General (Nurse Practitioner) Burnell Blanks, MD as Consulting Physician (Cardiology) Burnell Blanks, MD as Consulting Physician (Cardiology) Kathyrn Lass, MD (Family Medicine)  Extended Emergency Contact Information Primary Emergency Contact: Diggs,Helen  Johnnette Litter of [place] Phone: [phone] Relation: Sister Secondary Emergency Contact: Nichola Sizer of [place] Phone: [phone] Relation: Sister    Allergies: Other and Sulfa antibiotics  Chief Complaint  Patient presents with  . Acute Visit    Acute    HPI: Patient is 66 y.o. female who nursing asked me to see for yeast under both breasts. Pt says it has been there a while; admits itch and pain.  Past Medical History:  Diagnosis Date  . Abdominal hernia   . Arthritis    knees, back, R ankle   . Diabetes mellitus   . Edema    legs  . GERD (gastroesophageal reflux disease)   . HOH (hard of hearing)   . Hypertension   . Obesity    morbid obesity    Past Surgical History:  Procedure Laterality Date  . [place]?   right  . CARPAL TUNNEL RELEASE Left 07/10/2014   Procedure: LEFT ENDOSCOPIC CARPAL TUNNEL RELEASE ;  Surgeon: Jolyn Nap, MD;  Location: [place];  Service: Orthopedics;  Laterality: Left;  . COLONOSCOPY    . HERNIA REPAIR  07/03/2011   abd hernia  . NOSE SURGERY  2008   sinus  . TOTAL KNEE ARTHROPLASTY Left 02/06/2017   Procedure: LEFT TOTAL KNEE ARTHROPLASTY;  Surgeon: Mcarthur Rossetti, MD;  Location: [place];  Service: Orthopedics;  Laterality: Left;  . TUBAL LIGATION      Allergies as of 02/19/2017      Reactions   Other    NO Blood or Blood products   Sulfa Antibiotics Rash      Medication List       Accurate as of 02/19/17   3:31 PM. Always use your most recent med list.          cholecalciferol 1000 units tablet Commonly known as:  VITAMIN D Take 1 tablet by mouth daily.   E-400 PO Take 1 capsule by mouth daily.   EQL OMEGA 3 FISH OIL 1000 MG Caps Take 1 capsule by mouth daily.   glimepiride 4 MG tablet Commonly known as:  AMARYL TAKE 1 TABLET EVERY DAY WITH BREAKFAST   losartan-hydrochlorothiazide 100-12.5 MG tablet Commonly known as:  HYZAAR Take 1 tablet by mouth daily.   metFORMIN 1000 MG tablet Commonly known as:  GLUCOPHAGE Take 1 tablet (1,000 mg total) by mouth 2 (two) times daily with a meal.   methocarbamol 500 MG tablet Commonly known as:  ROBAXIN Take 1 tablet (500 mg total) by mouth every 6 (six) hours as needed for muscle spasms.   multivitamin tablet Take 1 tablet by mouth daily.   naproxen sodium 220 MG tablet Commonly known as:  ANAPROX Take 440 mg by mouth 2 (two) times daily with a meal.   oxyCODONE 5 MG immediate release tablet Commonly known as:  Oxy IR/ROXICODONE Take 5-10 mg by mouth every 6 (six) hours as needed for moderate pain or severe pain. 1 for mild to moderate pain  2 tablets for severe pain   ranitidine 150 MG  tablet Commonly known as:  ZANTAC Take 1 tablet (150 mg total) by mouth 2 (two) times daily.   rivaroxaban 10 MG Tabs tablet Commonly known as:  XARELTO Take 1 tablet (10 mg total) by mouth daily with breakfast.       No orders of the defined types were placed in this encounter.   Immunization History  Administered Date(s) Administered  . Influenza,inj,Quad PF,36+ Mos 09/21/2016  . PPD Test 02/09/2017  . Pneumococcal Polysaccharide-23 08/01/2006  . Td 12/21/2003    Social History  Substance Use Topics  . Smoking status: Never Smoker  . Smokeless tobacco: Never Used  . Alcohol use No    Review of Systems  DATA OBTAINED: from patient, nurse GENERAL:  no fevers, fatigue, appetite changes SKIN: No itching, rash as per  HPI HEENT: No complaint RESPIRATORY: No cough, wheezing, SOB CARDIAC: No chest pain, palpitations, lower extremity edema  GI: No abdominal pain, No N/V/D or constipation, No heartburn or reflux  GU: No dysuria, frequency or urgency, or incontinence  MUSCULOSKELETAL: No unrelieved bone/joint pain NEUROLOGIC: No headache, dizziness  PSYCHIATRIC: No overt anxiety or sadness  Vitals:   02/19/17 1459  BP: 140/77  Pulse: 93  Resp: 19  Temp: 97.9 F (36.6 C)   Body mass index is 51.65 kg/m. Physical Exam  GENERAL APPEARANCE: Alert, conversant, No acute distress obese BF SKIN:obvious yeast with intertrigenous area inder breast with skin fissures about 5 cm long both sides; no signs of infection HEENT: Unremarkable RESPIRATORY: Breathing is even, unlabored. Lung sounds are clear   CARDIOVASCULAR: Heart RRR no murmurs, rubs or gallops. No peripheral edema  GASTROINTESTINAL: Abdomen is soft, non-tender, not distended w/ normal bowel sounds.  GENITOURINARY: Bladder non tender, not distended  MUSCULOSKELETAL: No abnormal joints or musculature NEUROLOGIC: Cranial nerves 2-12 grossly intact. Moves all extremities PSYCHIATRIC: Mood and affect appropriate to situation, no behavioral issues  Patient Active Problem List   Diagnosis Date Noted  . Acute blood loss as cause of postoperative anemia 02/16/2017  . GERD (gastroesophageal reflux disease) 02/16/2017  . Vitamin D deficiency 02/16/2017  . 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 Unilateral primary osteoarthritis, right knee 01/10/2017  . Unilateral primary osteoarthritis, left knee 01/10/2017  . Subclinical hypothyroidism 10/11/2016  . Decreased hearing of left ear 10/11/2016  . Hypertension   . Diabetes mellitus type 2 in obese ([place]) 12/31/2015  . Hypertriglyceridemia 08/30/2015  . Dyspnea 04/02/2013  . Cough 02/12/2013  . Abdominal pain, left lateral 04/03/2012  . Edema 08/22/2011  . Tachycardia 08/22/2011  .  Diabetes ([place]) 10/16/2007  . OBESITY, MORBID 10/16/2007  . Essential hypertension 10/16/2007  . Osteoarthrosis involving lower leg 10/16/2007    CMP     Component Value Date/Time   NA 137 02/07/2017 0443   NA 137 02/07/2017   K 4.3 02/07/2017 0443   CL 102 02/07/2017 0443   CO2 26 02/07/2017 0443   GLUCOSE 146 (H) 02/07/2017 0443   BUN 24 (H) 02/07/2017 0443   BUN 24 (A) 02/07/2017   CREATININE 1.20 (H) 02/07/2017 0443   CREATININE 1.17 (H) 11/08/2016 0747   CALCIUM 8.4 (L) 02/07/2017 0443   PROT 7.0 09/29/2016 0811   ALBUMIN 3.8 09/29/2016 0811   AST 8 (L) 09/29/2016 0811   ALT 6 09/29/2016 0811   ALKPHOS 68 09/29/2016 0811   BILITOT 0.4 09/29/2016 0811   GFRNONAA 47 (L) 02/07/2017 0443   GFRNONAA 52 (L) 09/29/2016 0811   GFRAA 55 (L) 02/07/2017 [id number]  GFRAA 60 09/29/2016 0811    Recent Labs  11/08/16 0747 01/30/17 1117 02/07/17 02/07/17 0443  NA 140 139 137 137  K 4.8 4.6  --  4.3  CL 104 106  --  102  CO2 25 22  --  26  GLUCOSE 126* 161*  --  146*  BUN 19 22* 24* 24*  CREATININE 1.17* 1.16* 1.2* 1.20*  CALCIUM 9.2 9.5  --  8.4*    Recent Labs  07/05/16 0740 09/29/16 0811  AST 9* 8*  ALT 8 6  ALKPHOS 66 68  BILITOT 0.3 0.4  PROT 7.0 7.0  ALBUMIN 3.9 3.8    Recent Labs  07/05/16 0740 09/29/16 0811  02/07/17 0443 02/08/17 02/08/17 0708 02/09/17 02/09/17 0320  WBC 6.7 7.1  < > 7.7 11.4 11.4* 11.1 11.1*  NEUTROABS 5,092 4,899  --   --   --   --   --   --   HGB 11.4* 11.4*  < > 9.3* 9.1* 9.1*  --  8.3*  HCT 36.3 36.1  < > 30.4* 30* 29.9*  --  26.9*  MCV 92.4 92.6  < > 95.3  --  95.2  --  93.4  PLT 310 293  < > 262 273 273  --  234  < > = values in this interval not displayed.  Recent Labs  07/05/16 0740 09/29/16 0811  CHOL 137 146  LDLCALC 64 69  TRIG 158* 237*   Lab Results  Component Value Date   MICROALBUR 0.4 10/11/2016   Lab Results  Component Value Date   TSH 4.05 11/08/2016   Lab Results  Component Value Date   HGBA1C 6.6  (H) 01/30/2017   Lab Results  Component Value Date   CHOL 146 09/29/2016   HDL 30 (L) 09/29/2016   LDLCALC 69 09/29/2016   TRIG 237 (H) 09/29/2016   CHOLHDL 4.9 09/29/2016    Significant Diagnostic Results in last 30 days:  Dg Knee Left Port  Result Date: 02/06/2017 CLINICAL DATA:  Postoperative radiograph. Status post left total knee arthroplasty. Initial encounter. EXAM: PORTABLE LEFT KNEE - 1-2 VIEW COMPARISON:  Left knee radiographs performed 01/10/2017 FINDINGS: The patient's left total knee arthroplasty is grossly unremarkable in appearance, without evidence of loosening or new fracture. Postoperative soft tissue air is seen, tracking into the joint space. Postoperative lucency is noted at the remaining proximal medial tibia. Overlying skin staples and soft tissue swelling are noted. A Pellegrini-Stieda lesion is noted, reflecting prior medial collateral ligament injury. IMPRESSION: No evidence of loosening or new fracture. Left total knee arthroplasty is grossly unremarkable in appearance. Electronically Signed   By: Garald Balding M.D.   On: 02/06/2017 20:08    Assessment and Plan  INTERTRIGINOUS YEAST- since pt has fissures will use diflucan 100 mg daily for 14 days; will also use nizoral powder BID     [name]. Sheppard Coil, MD

## 2017-02-21 ENCOUNTER — Encounter (INDEPENDENT_AMBULATORY_CARE_PROVIDER_SITE_OTHER): Payer: Self-pay

## 2017-02-21 ENCOUNTER — Ambulatory Visit (INDEPENDENT_AMBULATORY_CARE_PROVIDER_SITE_OTHER): Payer: Medicare Other | Admitting: Orthopaedic Surgery

## 2017-02-21 DIAGNOSIS — Z96652 Presence of left artificial knee joint: Secondary | ICD-10-CM

## 2017-02-21 NOTE — Progress Notes (Signed)
[name] is following up in 2 weeks status post a left total knee replacement. She staying at a nursing facility and doing very well.  On examination of her left lower extremity her calf is soft. Her incisions well-healed. I removed all staples and placed Steri-Strips. She has full extension to already 95 of flexion.  She'll continue increase her activities as comfort allows. She'll transition from the skilled nursing center to home health therapy hopefully soon. She'll stop any blood thinning medication from my standpoint. We see her back in 4 weeks no x-rays will be needed.

## 2017-02-25 ENCOUNTER — Encounter: Payer: Self-pay | Admitting: Internal Medicine

## 2017-02-27 ENCOUNTER — Non-Acute Institutional Stay (SKILLED_NURSING_FACILITY): Payer: Medicare Other | Admitting: Internal Medicine

## 2017-02-27 ENCOUNTER — Encounter: Payer: Self-pay | Admitting: Internal Medicine

## 2017-02-27 DIAGNOSIS — E559 Vitamin D deficiency, unspecified: Secondary | ICD-10-CM | POA: Diagnosis not present

## 2017-02-27 DIAGNOSIS — I10 Essential (primary) hypertension: Secondary | ICD-10-CM | POA: Diagnosis not present

## 2017-02-27 DIAGNOSIS — M1710 Unilateral primary osteoarthritis, unspecified knee: Secondary | ICD-10-CM

## 2017-02-27 DIAGNOSIS — Z96652 Presence of left artificial knee joint: Secondary | ICD-10-CM | POA: Diagnosis not present

## 2017-02-27 DIAGNOSIS — E669 Obesity, unspecified: Secondary | ICD-10-CM | POA: Diagnosis not present

## 2017-02-27 DIAGNOSIS — K219 Gastro-esophageal reflux disease without esophagitis: Secondary | ICD-10-CM | POA: Diagnosis not present

## 2017-02-27 DIAGNOSIS — E1169 Type 2 diabetes mellitus with other specified complication: Secondary | ICD-10-CM | POA: Diagnosis not present

## 2017-02-27 DIAGNOSIS — D62 Acute posthemorrhagic anemia: Secondary | ICD-10-CM

## 2017-02-27 DIAGNOSIS — IMO0002 Reserved for concepts with insufficient information to code with codable children: Secondary | ICD-10-CM

## 2017-02-27 NOTE — Progress Notes (Addendum)
Location:  [place] Room Number: 326Z Place of Service:  SNF (31) Noah Delaine. Sheppard Coil, MD  PCP: Harland Dingwall, NP-C Patient Care Team: Girtha Rm, NP-C as PCP - General (Nurse Practitioner) Burnell Blanks, MD as Consulting Physician (Cardiology) Burnell Blanks, MD as Consulting Physician (Cardiology) Kathyrn Lass, MD (Family Medicine)  Extended Emergency Contact Information Primary Emergency Contact: Diggs,Helen  [place] Phone: [phone] Relation: Sister Secondary Emergency Contact: Nichola Sizer of [place] Phone: [phone] Relation: Sister  Allergies  Allergen Reactions  . Other     NO Blood or Blood products  . Sulfa Antibiotics Rash    Chief Complaint  Patient presents with  . Discharge Note    Discharged from SNF    HPI:  66 y.o. female with HTN, DM2 and GERD who was admitted to [facility] form 2/20-23 for a L knee arthroplasty 2/2 OA with unremitting pain that affected rest waking, working and sleeping. Course was unremarkable except for acute post op anemia, that did not require a transfusion. Pt is admitted to SNF for OT/PTand is now ready to be d/c to home.     Past Medical History:  Diagnosis Date  . Abdominal hernia   . Arthritis    knees, back, R ankle   . Diabetes mellitus   . Edema    legs  . GERD (gastroesophageal reflux disease)   . HOH (hard of hearing)   . Hypertension   . Obesity    morbid obesity    Past Surgical History:  Procedure Laterality Date  . [place]?   right  . CARPAL TUNNEL RELEASE Left 07/10/2014   Procedure: LEFT ENDOSCOPIC CARPAL TUNNEL RELEASE ;  Surgeon: Jolyn Nap, MD;  Location: [place];  Service: Orthopedics;  Laterality: Left;  . COLONOSCOPY    . HERNIA REPAIR  07/03/2011   abd hernia  . NOSE SURGERY  2008   sinus  . TOTAL KNEE ARTHROPLASTY Left 02/06/2017   Procedure: LEFT  TOTAL KNEE ARTHROPLASTY;  Surgeon: Mcarthur Rossetti, MD;  Location: [place];  Service: Orthopedics;  Laterality: Left;  . TUBAL LIGATION       reports that she has never smoked. She has never used smokeless tobacco. She reports that she does not drink alcohol or use drugs. Social History   Social History  . Marital status: Single    Spouse name: N/A  . Number of children: 2  . Years of education: N/A   Occupational History  . UNEMPLOYED Unemployed   Social History Main Topics  . Smoking status: Never Smoker  . Smokeless tobacco: Never Used  . Alcohol use No  . Drug use: No  . Sexual activity: No   Other Topics Concern  . Not on file   Social History Narrative  . No narrative on file    Pertinent  Health Maintenance Due  Topic Date Due  . COLONOSCOPY  09/15/2003  . OPHTHALMOLOGY EXAM  09/30/2016  . HEMOGLOBIN A1C  07/30/2017  . FOOT EXAM  10/11/2017  . MAMMOGRAM  10/03/2018  . PAP SMEAR  09/22/2019  . INFLUENZA VACCINE  Completed    Medications: Allergies as of 02/27/2017      Reactions   Other    NO Blood or Blood products   Sulfa Antibiotics Rash      Medication List       Accurate as of 02/27/17  5:50  PM. Always use your most recent med list.          cholecalciferol 1000 units tablet Commonly known as:  VITAMIN D Take 1 tablet by mouth daily.   E-400 PO Take 1 capsule by mouth daily.   EQL OMEGA 3 FISH OIL 1000 MG Caps Take 1 capsule by mouth daily.   glimepiride 4 MG tablet Commonly known as:  AMARYL TAKE 1 TABLET EVERY DAY WITH BREAKFAST   losartan-hydrochlorothiazide 100-12.5 MG tablet Commonly known as:  HYZAAR Take 1 tablet by mouth daily.   metFORMIN 1000 MG tablet Commonly known as:  GLUCOPHAGE Take 1 tablet (1,000 mg total) by mouth 2 (two) times daily with a meal.   methocarbamol 500 MG tablet Commonly known as:  ROBAXIN Take 1 tablet (500 mg total) by mouth every 6 (six) hours as needed for muscle spasms.   multivitamin  tablet Take 1 tablet by mouth daily.   naproxen sodium 220 MG tablet Commonly known as:  ANAPROX Take 440 mg by mouth 2 (two) times daily with a meal.   oxyCODONE 5 MG immediate release tablet Commonly known as:  Oxy IR/ROXICODONE Take 5-10 mg by mouth every 6 (six) hours as needed for moderate pain or severe pain. 1 for mild to moderate pain  2 tablets for severe pain   ranitidine 150 MG tablet Commonly known as:  ZANTAC Take 1 tablet (150 mg total) by mouth 2 (two) times daily.        Vitals:   02/27/17 1047  BP: 137/77  Pulse: 83  Resp: 20  Temp: 97.9 F (36.6 C)  SpO2: 93%  Weight: (!) 316 lb (143.3 kg)  Height: 5\' 6"  (1.676 m)   Body mass index is 51 kg/m.  Physical Exam  GENERAL APPEARANCE: Alert, conversant. No acute distress.  HEENT: Unremarkable. RESPIRATORY: Breathing is even, unlabored. Lung sounds are clear   CARDIOVASCULAR: Heart RRR no murmurs, rubs or gallops. No peripheral edema.  GASTROINTESTINAL: Abdomen is soft, non-tender, not distended w/ normal bowel sounds.  NEUROLOGIC: Cranial nerves 2-12 grossly intact. Moves all extremities   Labs reviewed: Basic Metabolic Panel:  Recent Labs  11/08/16 0747 01/30/17 1117 02/07/17 02/07/17 0443 02/14/17  NA 140 139 137 137 142  K 4.8 4.6  --  4.3 5.1  CL 104 106  --  102  --   CO2 25 22  --  26  --   GLUCOSE 126* 161*  --  146*  --   BUN 19 22* 24* 24* 21  CREATININE 1.17* 1.16* 1.2* 1.20* 0.9  CALCIUM 9.2 9.5  --  8.4*  --    Lab Results  Component Value Date   MICROALBUR 0.4 10/11/2016   Liver Function Tests:  Recent Labs  07/05/16 0740 09/29/16 0811  AST 9* 8*  ALT 8 6  ALKPHOS 66 68  BILITOT 0.3 0.4  PROT 7.0 7.0  ALBUMIN 3.9 3.8   No results for input(s): LIPASE, AMYLASE in the last 8760 hours. No results for input(s): AMMONIA in the last 8760 hours. CBC:  Recent Labs  07/05/16 0740 09/29/16 0811  02/07/17 0443  02/08/17 0708 02/09/17 02/09/17 0320 02/14/17  WBC 6.7  7.1  < > 7.7  < > 11.4* 11.1 11.1* 8.1  NEUTROABS 5,092 4,899  --   --   --   --   --   --   --   HGB 11.4* 11.4*  < > 9.3*  < > 9.1*  --  8.3*  9.4*  HCT 36.3 36.1  < > 30.4*  < > 29.9*  --  26.9* 31*  MCV 92.4 92.6  < > 95.3  --  95.2  --  93.4  --   PLT 310 293  < > 262  < > 273  --  234 342  < > = values in this interval not displayed. Lipid  Recent Labs  07/05/16 0740 09/29/16 0811  CHOL 137 146  HDL 41* 30*  LDLCALC 64 69  TRIG 158* 237*   Cardiac Enzymes: No results for input(s): CKTOTAL, CKMB, CKMBINDEX, TROPONINI in the last 8760 hours. BNP: No results for input(s): BNP in the last 8760 hours. CBG:  Recent Labs  02/08/17 1123 02/08/17 2156 02/09/17 0624  GLUCAP 197* 220* 191*    Procedures and Imaging Studies During Stay: Dg Knee Left Port  Result Date: 02/06/2017 CLINICAL DATA:  Postoperative radiograph. Status post left total knee arthroplasty. Initial encounter. EXAM: PORTABLE LEFT KNEE - 1-2 VIEW COMPARISON:  Left knee radiographs performed 01/10/2017 FINDINGS: The patient's left total knee arthroplasty is grossly unremarkable in appearance, without evidence of loosening or new fracture. Postoperative soft tissue air is seen, tracking into the joint space. Postoperative lucency is noted at the remaining proximal medial tibia. Overlying skin staples and soft tissue swelling are noted. A Pellegrini-Stieda lesion is noted, reflecting prior medial collateral ligament injury. IMPRESSION: No evidence of loosening or new fracture. Left total knee arthroplasty is grossly unremarkable in appearance. Electronically Signed   By: Garald Balding M.D.   On: 02/06/2017 20:08    Assessment/Plan:   Status post total left knee replacement  Osteoarthrosis involving lower leg  Acute blood loss as cause of postoperative anemia  Diabetes mellitus type 2 in obese [facility])  Essential hypertension  Gastroesophageal reflux disease without esophagitis  Vitamin D  deficiency   Patient is being discharged with the following home health services:  HH/OT/PT/Nursing  Patient is being discharged with the following durable medical equipment:  Rolling walker  Patient has been advised to f/u with their PCP in 1-2 weeks to bring them up to date on their rehab stay.  Social services at facility was responsible for arranging this appointment.  Pt was provided with a 30 day supply of prescriptions for medications and refills must be obtained from their PCP.  For controlled substances, a more limited supply may be provided adequate until PCP appointment only.     Time spent > 30 min;> 50% of time with patient was spent reviewing records, labs, tests and studies, counseling and developing plan of care  Noah Delaine. Sheppard Coil, MD

## 2017-03-02 DIAGNOSIS — E119 Type 2 diabetes mellitus without complications: Secondary | ICD-10-CM | POA: Diagnosis not present

## 2017-03-02 DIAGNOSIS — M1990 Unspecified osteoarthritis, unspecified site: Secondary | ICD-10-CM | POA: Diagnosis not present

## 2017-03-02 DIAGNOSIS — I10 Essential (primary) hypertension: Secondary | ICD-10-CM | POA: Diagnosis not present

## 2017-03-02 DIAGNOSIS — R262 Difficulty in walking, not elsewhere classified: Secondary | ICD-10-CM | POA: Diagnosis not present

## 2017-03-02 DIAGNOSIS — K219 Gastro-esophageal reflux disease without esophagitis: Secondary | ICD-10-CM | POA: Diagnosis not present

## 2017-03-02 DIAGNOSIS — Z96652 Presence of left artificial knee joint: Secondary | ICD-10-CM | POA: Diagnosis not present

## 2017-03-05 DIAGNOSIS — I10 Essential (primary) hypertension: Secondary | ICD-10-CM | POA: Diagnosis not present

## 2017-03-05 DIAGNOSIS — K219 Gastro-esophageal reflux disease without esophagitis: Secondary | ICD-10-CM | POA: Diagnosis not present

## 2017-03-05 DIAGNOSIS — E119 Type 2 diabetes mellitus without complications: Secondary | ICD-10-CM | POA: Diagnosis not present

## 2017-03-05 DIAGNOSIS — R262 Difficulty in walking, not elsewhere classified: Secondary | ICD-10-CM | POA: Diagnosis not present

## 2017-03-05 DIAGNOSIS — M1990 Unspecified osteoarthritis, unspecified site: Secondary | ICD-10-CM | POA: Diagnosis not present

## 2017-03-05 DIAGNOSIS — Z96652 Presence of left artificial knee joint: Secondary | ICD-10-CM | POA: Diagnosis not present

## 2017-03-07 DIAGNOSIS — Z96652 Presence of left artificial knee joint: Secondary | ICD-10-CM | POA: Diagnosis not present

## 2017-03-07 DIAGNOSIS — M1990 Unspecified osteoarthritis, unspecified site: Secondary | ICD-10-CM | POA: Diagnosis not present

## 2017-03-07 DIAGNOSIS — I10 Essential (primary) hypertension: Secondary | ICD-10-CM | POA: Diagnosis not present

## 2017-03-07 DIAGNOSIS — R262 Difficulty in walking, not elsewhere classified: Secondary | ICD-10-CM | POA: Diagnosis not present

## 2017-03-07 DIAGNOSIS — E119 Type 2 diabetes mellitus without complications: Secondary | ICD-10-CM | POA: Diagnosis not present

## 2017-03-07 DIAGNOSIS — K219 Gastro-esophageal reflux disease without esophagitis: Secondary | ICD-10-CM | POA: Diagnosis not present

## 2017-03-12 DIAGNOSIS — I10 Essential (primary) hypertension: Secondary | ICD-10-CM | POA: Diagnosis not present

## 2017-03-12 DIAGNOSIS — K219 Gastro-esophageal reflux disease without esophagitis: Secondary | ICD-10-CM | POA: Diagnosis not present

## 2017-03-12 DIAGNOSIS — E119 Type 2 diabetes mellitus without complications: Secondary | ICD-10-CM | POA: Diagnosis not present

## 2017-03-12 DIAGNOSIS — R262 Difficulty in walking, not elsewhere classified: Secondary | ICD-10-CM | POA: Diagnosis not present

## 2017-03-12 DIAGNOSIS — M1990 Unspecified osteoarthritis, unspecified site: Secondary | ICD-10-CM | POA: Diagnosis not present

## 2017-03-12 DIAGNOSIS — Z96652 Presence of left artificial knee joint: Secondary | ICD-10-CM | POA: Diagnosis not present

## 2017-03-14 DIAGNOSIS — K219 Gastro-esophageal reflux disease without esophagitis: Secondary | ICD-10-CM | POA: Diagnosis not present

## 2017-03-14 DIAGNOSIS — Z96652 Presence of left artificial knee joint: Secondary | ICD-10-CM | POA: Diagnosis not present

## 2017-03-14 DIAGNOSIS — M1990 Unspecified osteoarthritis, unspecified site: Secondary | ICD-10-CM | POA: Diagnosis not present

## 2017-03-14 DIAGNOSIS — E119 Type 2 diabetes mellitus without complications: Secondary | ICD-10-CM | POA: Diagnosis not present

## 2017-03-14 DIAGNOSIS — I10 Essential (primary) hypertension: Secondary | ICD-10-CM | POA: Diagnosis not present

## 2017-03-14 DIAGNOSIS — R262 Difficulty in walking, not elsewhere classified: Secondary | ICD-10-CM | POA: Diagnosis not present

## 2017-03-19 DIAGNOSIS — E119 Type 2 diabetes mellitus without complications: Secondary | ICD-10-CM | POA: Diagnosis not present

## 2017-03-19 DIAGNOSIS — M1990 Unspecified osteoarthritis, unspecified site: Secondary | ICD-10-CM | POA: Diagnosis not present

## 2017-03-19 DIAGNOSIS — Z96652 Presence of left artificial knee joint: Secondary | ICD-10-CM | POA: Diagnosis not present

## 2017-03-19 DIAGNOSIS — I10 Essential (primary) hypertension: Secondary | ICD-10-CM | POA: Diagnosis not present

## 2017-03-19 DIAGNOSIS — K219 Gastro-esophageal reflux disease without esophagitis: Secondary | ICD-10-CM | POA: Diagnosis not present

## 2017-03-19 DIAGNOSIS — R262 Difficulty in walking, not elsewhere classified: Secondary | ICD-10-CM | POA: Diagnosis not present

## 2017-03-23 NOTE — Progress Notes (Signed)
   02/07/17 1000  OT G-codes **NOT FOR INPATIENT CLASS**  Functional Assessment Tool Used Clinical judgement;Other (comment) (and chart review)  Functional Limitation Self care  Self Care Current Status [phone]) CM  Self Care Goal Status (E3662) CI   Late entry for Amy Stefanie Libel, OTR/L [phone] 03/23/2017

## 2017-03-26 DIAGNOSIS — E119 Type 2 diabetes mellitus without complications: Secondary | ICD-10-CM | POA: Diagnosis not present

## 2017-03-26 DIAGNOSIS — I10 Essential (primary) hypertension: Secondary | ICD-10-CM | POA: Diagnosis not present

## 2017-03-26 DIAGNOSIS — M1990 Unspecified osteoarthritis, unspecified site: Secondary | ICD-10-CM | POA: Diagnosis not present

## 2017-03-26 DIAGNOSIS — K219 Gastro-esophageal reflux disease without esophagitis: Secondary | ICD-10-CM | POA: Diagnosis not present

## 2017-03-26 DIAGNOSIS — Z96652 Presence of left artificial knee joint: Secondary | ICD-10-CM | POA: Diagnosis not present

## 2017-03-26 DIAGNOSIS — R262 Difficulty in walking, not elsewhere classified: Secondary | ICD-10-CM | POA: Diagnosis not present

## 2017-03-28 ENCOUNTER — Ambulatory Visit (INDEPENDENT_AMBULATORY_CARE_PROVIDER_SITE_OTHER): Payer: Medicare Other | Admitting: Physician Assistant

## 2017-03-28 DIAGNOSIS — Z96652 Presence of left artificial knee joint: Secondary | ICD-10-CM

## 2017-03-28 NOTE — Progress Notes (Signed)
[name] returns today status post left total knee arthroplasty 01/19/2017. She is overall doing well she's working with physical therapy. She does ambulate with a walker when out of her home this not use any assistive device to ambulate in the home. She has no complaints. She is talked about scheduling a right total knee sometime in August. She has end-stage arthritis of the right knee.  Physical exam left knee she has full extension flexion to 110 calf supple nontender. No instability valgus varus stressing of the left. Surgical incisions well-healed no signs of infection.  Plan: Have her continue to work on range of motion strengthening knee. Also scar tissue mobilization. We'll see her back in early July to schedule her for a right total knee arthroplasty. No films needed at that time we had previous films on: System from January 2018. She can return sooner if there is any questions or concerns.

## 2017-03-29 DIAGNOSIS — I10 Essential (primary) hypertension: Secondary | ICD-10-CM | POA: Diagnosis not present

## 2017-03-29 DIAGNOSIS — M1990 Unspecified osteoarthritis, unspecified site: Secondary | ICD-10-CM | POA: Diagnosis not present

## 2017-03-29 DIAGNOSIS — E119 Type 2 diabetes mellitus without complications: Secondary | ICD-10-CM | POA: Diagnosis not present

## 2017-03-29 DIAGNOSIS — R262 Difficulty in walking, not elsewhere classified: Secondary | ICD-10-CM | POA: Diagnosis not present

## 2017-03-29 DIAGNOSIS — Z96652 Presence of left artificial knee joint: Secondary | ICD-10-CM | POA: Diagnosis not present

## 2017-03-29 DIAGNOSIS — K219 Gastro-esophageal reflux disease without esophagitis: Secondary | ICD-10-CM | POA: Diagnosis not present

## 2017-03-29 NOTE — Progress Notes (Signed)
Late entry for missing G-code.   03-04-17 1233  PT G-Codes **NOT FOR INPATIENT CLASS**  Functional Assessment Tool Used AM-PAC 6 Clicks Basic Mobility;Clinical judgement  Functional Limitation Mobility: Walking and moving around  Mobility: Walking and Moving Around Current Status (V7473) CL  Mobility: Walking and Moving Around Goal Status (U0370) CK  Cassell Clement, PT, CSCS Pager [phone] Office [phone]

## 2017-04-19 ENCOUNTER — Telehealth: Payer: Self-pay | Admitting: Internal Medicine

## 2017-04-19 DIAGNOSIS — I739 Peripheral vascular disease, unspecified: Secondary | ICD-10-CM

## 2017-04-19 NOTE — Telephone Encounter (Signed)
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   Normal range 1.0-1.4 Mild range 0.9-0.99 Moderate range 0.6-0.89 Significant range 0.3-0.59 Severe range 0-0.29

## 2017-04-22 NOTE — Telephone Encounter (Signed)
Please refer her to vascular for further evaluation

## 2017-04-23 NOTE — Telephone Encounter (Signed)
Pt was notified that we are sending her for further evaulation. They will contact her to schedule an appt

## 2017-05-12 ENCOUNTER — Emergency Department (HOSPITAL_COMMUNITY)
Admission: EM | Admit: 2017-05-12 | Discharge: 2017-05-12 | Disposition: A | Discharge status: Home / Self Care | Payer: Medicare Other | Attending: Emergency Medicine | Admitting: Emergency Medicine

## 2017-05-12 ENCOUNTER — Encounter (HOSPITAL_COMMUNITY): Payer: Self-pay | Admitting: *Deleted

## 2017-05-12 ENCOUNTER — Emergency Department (HOSPITAL_COMMUNITY): Payer: Medicare Other

## 2017-05-12 DIAGNOSIS — R55 Syncope and collapse: Secondary | ICD-10-CM | POA: Diagnosis not present

## 2017-05-12 DIAGNOSIS — Z5181 Encounter for therapeutic drug level monitoring: Secondary | ICD-10-CM | POA: Diagnosis not present

## 2017-05-12 DIAGNOSIS — Z7984 Long term (current) use of oral hypoglycemic drugs: Secondary | ICD-10-CM | POA: Insufficient documentation

## 2017-05-12 DIAGNOSIS — Z96652 Presence of left artificial knee joint: Secondary | ICD-10-CM | POA: Insufficient documentation

## 2017-05-12 DIAGNOSIS — I10 Essential (primary) hypertension: Secondary | ICD-10-CM | POA: Insufficient documentation

## 2017-05-12 DIAGNOSIS — R42 Dizziness and giddiness: Secondary | ICD-10-CM | POA: Insufficient documentation

## 2017-05-12 DIAGNOSIS — E119 Type 2 diabetes mellitus without complications: Secondary | ICD-10-CM | POA: Insufficient documentation

## 2017-05-12 DIAGNOSIS — Z79899 Other long term (current) drug therapy: Secondary | ICD-10-CM | POA: Diagnosis not present

## 2017-05-12 DIAGNOSIS — R112 Nausea with vomiting, unspecified: Secondary | ICD-10-CM | POA: Diagnosis present

## 2017-05-12 LAB — CBC
HCT: 33.9 % — ABNORMAL LOW (ref 36.0–46.0)
Hemoglobin: 10 g/dL — ABNORMAL LOW (ref 12.0–15.0)
MCH: 28.2 pg (ref 26.0–34.0)
MCHC: 29.5 g/dL — ABNORMAL LOW (ref 30.0–36.0)
MCV: 95.5 fL (ref 78.0–100.0)
Platelets: 254 10*3/uL (ref 150–400)
RBC: 3.55 MIL/uL — ABNORMAL LOW (ref 3.87–5.11)
RDW: 14.3 % (ref 11.5–15.5)
WBC: 8.4 10*3/uL (ref 4.0–10.5)

## 2017-05-12 LAB — I-STAT CHEM 8, ED
BUN: 23 mg/dL — ABNORMAL HIGH (ref 6–20)
Calcium, Ion: 1.13 mmol/L — ABNORMAL LOW (ref 1.15–1.40)
Chloride: 107 mmol/L (ref 101–111)
Creatinine, Ser: 1.1 mg/dL — ABNORMAL HIGH (ref 0.44–1.00)
Glucose, Bld: 191 mg/dL — ABNORMAL HIGH (ref 65–99)
HCT: 31 % — ABNORMAL LOW (ref 36.0–46.0)
Hemoglobin: 10.5 g/dL — ABNORMAL LOW (ref 12.0–15.0)
Potassium: 3.8 mmol/L (ref 3.5–5.1)
Sodium: 140 mmol/L (ref 135–145)
TCO2: 25 mmol/L (ref 0–100)

## 2017-05-12 LAB — COMPREHENSIVE METABOLIC PANEL
ALT: 11 U/L — ABNORMAL LOW (ref 14–54)
AST: 18 U/L (ref 15–41)
Albumin: 3.4 g/dL — ABNORMAL LOW (ref 3.5–5.0)
Alkaline Phosphatase: 66 U/L (ref 38–126)
Anion gap: 13 (ref 5–15)
BUN: 19 mg/dL (ref 6–20)
CO2: 21 mmol/L — ABNORMAL LOW (ref 22–32)
Calcium: 9.2 mg/dL (ref 8.9–10.3)
Chloride: 106 mmol/L (ref 101–111)
Creatinine, Ser: 1.11 mg/dL — ABNORMAL HIGH (ref 0.44–1.00)
GFR calc Af Amer: 60 mL/min — ABNORMAL LOW (ref >60)
GFR calc non Af Amer: 52 mL/min — ABNORMAL LOW (ref >60)
Glucose, Bld: 197 mg/dL — ABNORMAL HIGH (ref 65–99)
Potassium: 3.7 mmol/L (ref 3.5–5.1)
Sodium: 140 mmol/L (ref 135–145)
Total Bilirubin: 0.4 mg/dL (ref 0.3–1.2)
Total Protein: 6.7 g/dL (ref 6.5–8.1)

## 2017-05-12 LAB — CBG MONITORING, ED: Glucose-Capillary: 189 mg/dL — ABNORMAL HIGH (ref 65–99)

## 2017-05-12 LAB — DIFFERENTIAL
Basophils Absolute: 0 10*3/uL (ref 0.0–0.1)
Basophils Relative: 0 %
Eosinophils Absolute: 0.2 10*3/uL (ref 0.0–0.7)
Eosinophils Relative: 2 %
Lymphocytes Relative: 18 %
Lymphs Abs: 1.5 10*3/uL (ref 0.7–4.0)
Monocytes Absolute: 0.4 10*3/uL (ref 0.1–1.0)
Monocytes Relative: 5 %
Neutro Abs: 6 10*3/uL (ref 1.7–7.7)
Neutrophils Relative %: 75 %

## 2017-05-12 LAB — APTT: aPTT: 26 seconds (ref 24–36)

## 2017-05-12 LAB — PROTIME-INR
INR: 0.95
Prothrombin Time: 12.6 seconds (ref 11.4–15.2)

## 2017-05-12 LAB — LIPASE, BLOOD: Lipase: 21 U/L (ref 11–51)

## 2017-05-12 LAB — I-STAT TROPONIN, ED: Troponin i, poc: 0 ng/mL (ref 0.00–0.08)

## 2017-05-12 MED ORDER — MECLIZINE HCL 25 MG PO TABS
25.0000 mg | ORAL_TABLET | Freq: Once | ORAL | Status: AC
  Administered 2017-05-12: 25 mg via ORAL
  Filled 2017-05-12: qty 1

## 2017-05-12 MED ORDER — ONDANSETRON HCL 4 MG/2ML IJ SOLN
4.0000 mg | Freq: Once | INTRAMUSCULAR | Status: AC
Start: 2017-05-12 — End: 2017-05-12
  Administered 2017-05-12: 4 mg via INTRAVENOUS
  Filled 2017-05-12: qty 2

## 2017-05-12 MED ORDER — ONDANSETRON 4 MG PO TBDP
8.0000 mg | ORAL_TABLET | Freq: Once | ORAL | Status: AC
  Administered 2017-05-12: 8 mg via ORAL
  Filled 2017-05-12: qty 2

## 2017-05-12 MED ORDER — SODIUM CHLORIDE 0.9 % IV BOLUS (SEPSIS)
1000.0000 mL | Freq: Once | INTRAVENOUS | Status: AC
  Administered 2017-05-12: 1000 mL via INTRAVENOUS

## 2017-05-12 MED ORDER — MECLIZINE HCL 25 MG PO TABS: 25.0000 mg | ORAL_TABLET | Freq: Three times a day (TID) | ORAL | 0 refills | Status: DC | PRN

## 2017-05-12 MED ORDER — IOPAMIDOL (ISOVUE-370) INJECTION 76%
INTRAVENOUS | Status: AC
  Filled 2017-05-12: qty 50

## 2017-05-12 NOTE — ED Triage Notes (Signed)
The pt arrived by ptar from [place] where she lives.  She has had vertigo with nand v for one week   No pain  Hx diabetes cbg 142

## 2017-05-12 NOTE — Consult Note (Signed)
Referring Physician: Dr. Kathrynn Humble    Chief Complaint: Vertigo with nausea and vomiting  HPI: [name] is an 66 y.o. female who presents with a one week history of vertigo with nausea and vomiting. She states that she has no other symptoms with this, including no confusion, no difficulty with speech, no incoordination, no sensory numbness and no weakness. At baseline she walks with a walker. She has chronic right knee pain.   LSN: Symptoms began one week ago tPA Given: No: NIHSS = 0  Past Medical History:  Diagnosis Date  . Abdominal hernia   . Arthritis    knees, back, R ankle   . Diabetes mellitus   . Edema    legs  . GERD (gastroesophageal reflux disease)   . HOH (hard of hearing)   . Hypertension   . Obesity    morbid obesity    Past Surgical History:  Procedure Laterality Date  . [place]?   right  . CARPAL TUNNEL RELEASE Left 07/10/2014   Procedure: LEFT ENDOSCOPIC CARPAL TUNNEL RELEASE ;  Surgeon: Jolyn Nap, MD;  Location: [place];  Service: Orthopedics;  Laterality: Left;  . COLONOSCOPY    . HERNIA REPAIR  07/03/2011   abd hernia  . NOSE SURGERY  2008   sinus  . TOTAL KNEE ARTHROPLASTY Left 02/06/2017   Procedure: LEFT TOTAL KNEE ARTHROPLASTY;  Surgeon: Mcarthur Rossetti, MD;  Location: [place];  Service: Orthopedics;  Laterality: Left;  . TUBAL LIGATION      Family History  Problem Relation Age of Onset  . Hypertension Mother   . Kidney disease Mother   . Diabetes Father   . Hypertension Father   . Diabetes Sister   . Hypertension Sister   . Diabetes Brother   . Hypertension Brother   . Heart disease Brother 78       heart attack  . Hypertension Sister    Social History:  reports that she has never smoked. She has never used smokeless tobacco. She reports that she does not drink alcohol or use drugs.  Allergies:  Allergies  Allergen Reactions  . Other     NO Blood or Blood products  . Sulfa  Antibiotics Rash    Medications:  cholecalciferol (VITAMIN D) 1000 units tablet Take 1 tablet by mouth daily.  [provider] Needs Review  glimepiride (AMARYL) 4 MG tablet TAKE 1 TABLET EVERY DAY WITH BREAKFAST Henson, Vickie L, NP-C Needs Review  losartan-hydrochlorothiazide (HYZAAR) 100-12.5 MG tablet Take 1 tablet by mouth daily. Henson, Vickie L, NP-C Needs Review  metFORMIN (GLUCOPHAGE) 1000 MG tablet Take 1 tablet (1,000 mg total) by mouth 2 (two) times daily with a meal. Henson, Vickie L, NP-C Needs Review  methocarbamol (ROBAXIN) 500 MG tablet Take 1 tablet (500 mg total) by mouth every 6 (six) hours as needed for muscle spasms. Mcarthur Rossetti, MD Needs Review  Multiple Vitamin (MULTIVITAMIN) tablet Take 1 tablet by mouth daily.  [provider] Needs Review  naproxen sodium (ANAPROX) 220 MG tablet Take 440 mg by mouth 2 (two) times daily with a meal. [provider] Needs Review  Omega-3 Fatty Acids (EQL OMEGA 3 FISH OIL) 1000 MG CAPS Take 1 capsule by mouth daily. [provider] Needs Review  oxyCODONE (OXY IR/ROXICODONE) 5 MG immediate release tablet Take 5-10 mg by mouth every 6 (six) hours as needed for moderate pain or severe pain. 1 for mild to moderate pain  2 tablets for severe pain [provider] Needs Review  ranitidine (ZANTAC) 150 MG tablet Take 1 tablet (150 mg total) by mouth 2 (two) times daily. Henson, Vickie L, NP-C Needs Review  Vitamin E (E-400 PO) Take 1 capsule by mouth daily.  [provider] Needs Review   ROS: As per HPI  Physical Examination: Blood pressure 137/77, pulse 81, temperature 97.5 F (36.4 C), temperature source Oral, resp. rate 20, height '5\' 6"'$  (1.676 m), weight (!) 143.8 kg (317 lb), SpO2 99 %.  General: Morbidly obese HEENT: Edentulous. [place]/AT Lungs: Respirations unlabored.  Ext: Warm and well perfused. No edema  Neurologic Examination: Mental Status: Alert, oriented, thought  content appropriate.  Speech fluent without evidence of aphasia.  Able to follow all commands without difficulty. Cranial Nerves: II:  Visual fields intact. PERRL  III,IV, VI: ptosis not present, EOMI. No nystagmus at rest or with eye movement in any direction.  V,VII: smile symmetric, facial temperature sensation normal bilaterally VIII: hearing intact to conversation IX,X: no hypophonia or hoarseness XI: bilateral shoulder shrug is symmetric XII: midline tongue extension  Motor: Right : Upper extremity   5/5       Left:     Upper extremity   5/5  Lower extremity   4+/5 with some limitation secondary to pain  Lower extremity   5/5 Normal tone throughout; no atrophy noted Sensory: Temp and light touch intact x 4. No extinction.  Deep Tendon Reflexes:  2+ bilateral brachioradialis, biceps and patellae. 1+ achilles bilaterally. Plantars: Right: downgoing   Left: downgoing Cerebellar: No ataxia with FNF or foot-tapping bilaterally.  Gait: Deferred  Results for orders placed or performed during the hospital encounter of 05/12/17 (from the past 48 hour(s))  Lipase, blood     Status: None   Collection Time: 05/12/17  6:07 AM  Result Value Ref Range   Lipase 21 11 - 51 U/L  Comprehensive metabolic panel     Status: Abnormal   Collection Time: 05/12/17  6:07 AM  Result Value Ref Range   Sodium 140 135 - 145 mmol/L   Potassium 3.7 3.5 - 5.1 mmol/L   Chloride 106 101 - 111 mmol/L   CO2 21 (L) 22 - 32 mmol/L   Glucose, Bld 197 (H) 65 - 99 mg/dL   BUN 19 6 - 20 mg/dL   Creatinine, Ser 1.11 (H) 0.44 - 1.00 mg/dL   Calcium 9.2 8.9 - 10.3 mg/dL   Total Protein 6.7 6.5 - 8.1 g/dL   Albumin 3.4 (L) 3.5 - 5.0 g/dL   AST 18 15 - 41 U/L   ALT 11 (L) 14 - 54 U/L   Alkaline Phosphatase 66 38 - 126 U/L   Total Bilirubin 0.4 0.3 - 1.2 mg/dL   GFR calc non Af Amer 52 (L) >60 mL/min   GFR calc Af Amer 60 (L) >60 mL/min    Comment: (NOTE) The eGFR has been calculated using the CKD EPI  equation. This calculation has not been validated in all clinical situations. eGFR's persistently <60 mL/min signify possible Chronic Kidney Disease.    Anion gap 13 5 - 15  CBC     Status: Abnormal   Collection Time: 05/12/17  6:07 AM  Result Value Ref Range   WBC 8.4 4.0 - 10.5 K/uL   RBC 3.55 (L) 3.87 - 5.11 MIL/uL   Hemoglobin 10.0 (L) 12.0 - 15.0 g/dL   HCT 33.9 (L) 36.0 - 46.0 %   MCV 95.5 78.0 -  100.0 fL   MCH 28.2 26.0 - 34.0 pg   MCHC 29.5 (L) 30.0 - 36.0 g/dL   RDW 14.3 11.5 - 15.5 %   Platelets 254 150 - 400 K/uL  Protime-INR     Status: None   Collection Time: 05/12/17  6:07 AM  Result Value Ref Range   Prothrombin Time 12.6 11.4 - 15.2 seconds   INR 0.95   APTT     Status: None   Collection Time: 05/12/17  6:07 AM  Result Value Ref Range   aPTT 26 24 - 36 seconds  Differential     Status: None   Collection Time: 05/12/17  6:07 AM  Result Value Ref Range   Neutrophils Relative % 75 %   Neutro Abs 6.0 1.7 - 7.7 K/uL   Lymphocytes Relative 18 %   Lymphs Abs 1.5 0.7 - 4.0 K/uL   Monocytes Relative 5 %   Monocytes Absolute 0.4 0.1 - 1.0 K/uL   Eosinophils Relative 2 %   Eosinophils Absolute 0.2 0.0 - 0.7 K/uL   Basophils Relative 0 %   Basophils Absolute 0.0 0.0 - 0.1 K/uL  I-stat troponin, ED     Status: None   Collection Time: 05/12/17  6:24 AM  Result Value Ref Range   Troponin i, poc 0.00 0.00 - 0.08 ng/mL   Comment 3            Comment: Due to the release kinetics of cTnI, a negative result within the first hours of the onset of symptoms does not rule out myocardial infarction with certainty. If myocardial infarction is still suspected, repeat the test at appropriate intervals.   I-Stat Chem 8, ED  (not at [facility])     Status: Abnormal   Collection Time: 05/12/17  6:51 AM  Result Value Ref Range   Sodium 140 135 - 145 mmol/L   Potassium 3.8 3.5 - 5.1 mmol/L   Chloride 107 101 - 111 mmol/L   BUN 23 (H) 6 - 20 mg/dL   Creatinine, Ser 1.10 (H)  0.44 - 1.00 mg/dL   Glucose, Bld 191 (H) 65 - 99 mg/dL   Calcium, Ion 1.13 (L) 1.15 - 1.40 mmol/L   TCO2 25 0 - 100 mmol/L   Hemoglobin 10.5 (L) 12.0 - 15.0 g/dL   HCT 31.0 (L) 36.0 - 46.0 %  CBG monitoring, ED     Status: Abnormal   Collection Time: 05/12/17  6:54 AM  Result Value Ref Range   Glucose-Capillary 189 (H) 65 - 99 mg/dL   Comment 1 Notify RN    Ct Head Code Stroke W/o Cm  Result Date: 05/12/2017 CLINICAL DATA:  Code stroke.  Nausea, vomiting, and dizziness. EXAM: CT HEAD WITHOUT CONTRAST TECHNIQUE: Contiguous axial images were obtained from the base of the skull through the vertex without intravenous contrast. COMPARISON:  None. FINDINGS: Brain: There is no evidence of acute infarct, intracranial hemorrhage, mass, midline shift, or extra-axial fluid collection. The ventricles and sulci are normal. A 4 mm chronic infarct is questioned in the superior left cerebellar hemisphere. Subcortical cerebral white matter hypodensities are nonspecific but compatible with mild chronic small vessel ischemic disease. Vascular: Mild calcified atherosclerosis at the skullbase. No hyperdense vessel. Skull: No fracture focal osseous lesion. Sinuses/Orbits: The visualized paranasal sinuses and mastoid air cells are clear. Orbits are unremarkable. Other: None. ASPECTS [facility] Stroke Program Early CT Score) - Ganglionic level infarction (caudate, lentiform nuclei, internal capsule, insula, M1-M3 cortex): 7 - Supraganglionic infarction (M4-M6  cortex): 3 Total score (0-10 with 10 being normal): 10 IMPRESSION: 1. No evidence of acute intracranial abnormality. 2. ASPECTS is 10. 3. Mild chronic small vessel ischemic disease. Possible chronic lacunar infarct in the left cerebellum. Results sent via text page to Dr. Cheral Marker on 05/12/2017 at 7:19 a.m. Electronically Signed   By: Logan Bores M.D.   On: 05/12/2017 07:20    Assessment: 66 y.o. female with a one week history of vertigo with nausea and vomiting. 1.  Neurological exam is nonfocal. NIHSS = 0. 2. CT head shows no acute intracranial abnormality. Mild chronic small vessel ischemic changes and a possible chronic lacunar infarct in the left cerebellum are noted.  3. Stroke Risk Factors - DM and HTN 4. Most likely etiology for her presentation is peripheral vestibulopathy. However, the possible left cerebellar lacunar infarct could be a late-subacute infarction, warranting full stroke work up.   Plan: 1. HgbA1c, fasting lipid panel 2. MRI, MRA of the brain without contrast 3. PT consult, OT consult, Speech consult 4. Echocardiogram 5. Carotid dopplers 6. Start ASA 81 mg po qd 7. Risk factor modification to include gradual weight loss and daily nonstrenuous exercise 8. Telemetry monitoring 9. Out of permissive HTN time window.  10. Trial of meclizine. If no effect, consider a trial of phenergan.    _0  signed: Dr. Kerney Elbe 05/12/2017, 7:32 AM

## 2017-05-12 NOTE — ED Notes (Signed)
Pt transported to MRI at this time 

## 2017-05-12 NOTE — Code Documentation (Signed)
66 year old female presents to [facility] via [facility] with sx dizziness, nausea and vomiting for one week.  Code stroke was called by EDP at [place].  Patient met in CT scan by stroke RN and stroke MD.  No focal sx noted.  NIHSS 0.  CT completed.  Patient states she has hx vertigo.  No focal neuro deficits noted.  Code stroke cancelled at [place] by Dr. Cheral Marker.  Handoff to [employer].

## 2017-05-12 NOTE — ED Notes (Signed)
Pt reports still feeling mildly dizzy, per edp vo for meclizine prior to dc

## 2017-05-12 NOTE — ED Notes (Signed)
Cancelled Code Stroke  

## 2017-05-12 NOTE — ED Notes (Signed)
Pt has returned from MRI. 

## 2017-05-12 NOTE — ED Provider Notes (Signed)
[facility] DEPT Provider Note   CSN: [id number] Arrival date & time: 05/12/17  5277     History   Chief Complaint Chief Complaint  Patient presents with  . Emesis    HPI [name] is a 66 y.o. female.  HPI Pt comes in with cc of dizziness and vomiting. Pt has hx of DM, HTN, vertigo. Pt reports that she started getting dizzy 4 days ago. Pt has had a daily episode of dizzy, and each episode lasts for several hours. Pt has hx of vertigo. She describes dizziness as off balance, until today when there is spinning. Pt has associated nausea. Pt hasnt had a vertigo attack in years, and her current episode is different. Patient also states that she has some noise in her L ear, and that started 1 day ago. Pt has no numbness, tingling, weakness, vision change.   Past Medical History:  Diagnosis Date  . Abdominal hernia   . Arthritis    knees, back, R ankle   . Diabetes mellitus   . Edema    legs  . GERD (gastroesophageal reflux disease)   . HOH (hard of hearing)   . Hypertension   . Obesity    morbid obesity    Patient Active Problem List   Diagnosis Date Noted  . Acute blood loss as cause of postoperative anemia 02/16/2017  . GERD (gastroesophageal reflux disease) 02/16/2017  . Vitamin D deficiency 02/16/2017  . 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 Unilateral primary osteoarthritis, right knee 01/10/2017  . Unilateral primary osteoarthritis, left knee 01/10/2017  . Subclinical hypothyroidism 10/11/2016  . Decreased hearing of left ear 10/11/2016  . Hypertension   . Diabetes mellitus type 2 in obese ([place]) 12/31/2015  . Hypertriglyceridemia 08/30/2015  . Dyspnea 04/02/2013  . Cough 02/12/2013  . Abdominal pain, left lateral 04/03/2012  . Edema 08/22/2011  . Tachycardia 08/22/2011  . Diabetes ([place]) 10/16/2007  . OBESITY, MORBID 10/16/2007  . Essential hypertension 10/16/2007  . Osteoarthrosis involving lower leg 10/16/2007    Past Surgical  History:  Procedure Laterality Date  . [place]?   right  . CARPAL TUNNEL RELEASE Left 07/10/2014   Procedure: LEFT ENDOSCOPIC CARPAL TUNNEL RELEASE ;  Surgeon: Jolyn Nap, MD;  Location: [facility];  Service: Orthopedics;  Laterality: Left;  . COLONOSCOPY    . HERNIA REPAIR  07/03/2011   abd hernia  . NOSE SURGERY  2008   sinus  . TOTAL KNEE ARTHROPLASTY Left 02/06/2017   Procedure: LEFT TOTAL KNEE ARTHROPLASTY;  Surgeon: Mcarthur Rossetti, MD;  Location: [facility];  Service: Orthopedics;  Laterality: Left;  . TUBAL LIGATION      OB History    No data available       Home Medications    Prior to Admission medications   Medication Sig Start Date End Date Taking? Authorizing Provider  cholecalciferol (VITAMIN D) 1000 units tablet Take 1,000 Units by mouth daily.    Yes [provider]  glimepiride (AMARYL) 4 MG tablet TAKE 1 TABLET EVERY DAY WITH BREAKFAST Patient taking differently: Take 4 mg by mouth daily with breakfast.  01/15/17  Yes Henson, Vickie L, NP-C  losartan-hydrochlorothiazide (HYZAAR) 100-12.5 MG tablet Take 1 tablet by mouth daily. 01/15/17  Yes Henson, Vickie L, NP-C  metFORMIN (GLUCOPHAGE) 1000 MG tablet Take 1 tablet (1,000 mg total) by mouth 2 (two) times daily with a meal. 01/15/17  Yes Henson, Vickie L, NP-C  Multiple Vitamin (MULTIVITAMIN) tablet Take 1 tablet by mouth daily.     Yes [provider]  naproxen sodium (ANAPROX) 220 MG tablet Take 440 mg by mouth 2 (two) times daily as needed (for pain).    Yes [provider]  Omega-3 Fatty Acids (EQL OMEGA 3 FISH OIL) 1000 MG CAPS Take 1,000 mg by mouth daily.    Yes [provider]  ranitidine (ZANTAC) 150 MG tablet Take 1 tablet (150 mg total) by mouth 2 (two) times daily. 01/15/17  Yes Henson, Vickie L, NP-C  Vitamin E (E-400 PO) Take 1 capsule by mouth daily.    Yes [provider]  meclizine (ANTIVERT) 25 MG tablet Take 1  tablet (25 mg total) by mouth 3 (three) times daily as needed for dizziness. 05/12/17   Virgel Manifold, MD  methocarbamol (ROBAXIN) 500 MG tablet Take 1 tablet (500 mg total) by mouth every 6 (six) hours as needed for muscle spasms. Patient not taking: Reported on 05/12/2017 02/09/17   Mcarthur Rossetti, MD    Family History Family History  Problem Relation Age of Onset  . Hypertension Mother   . Kidney disease Mother   . Diabetes Father   . Hypertension Father   . Diabetes Sister   . Hypertension Sister   . Diabetes Brother   . Hypertension Brother   . Heart disease Brother 76       heart attack  . Hypertension Sister     Social History Social History  Substance Use Topics  . Smoking status: Never Smoker  . Smokeless tobacco: Never Used  . Alcohol use No     Allergies   Other and Sulfa antibiotics   Review of Systems Review of Systems  Constitutional: Positive for activity change.  Cardiovascular: Negative for chest pain.  Gastrointestinal: Positive for vomiting.  Neurological: Positive for dizziness.  All other systems reviewed and are negative.    Physical Exam Updated Vital Signs BP 121/73   Pulse 85   Temp 97.5 F (36.4 C) (Oral)   Resp 18   Ht 5\' 6"  (1.676 m)   Wt (!) 143.8 kg (317 lb)   SpO2 100%   BMI 51.17 kg/m   Physical Exam  Constitutional: She is oriented to person, place, and time. She appears well-developed.  HENT:  Head: Normocephalic and atraumatic.  Eyes: EOM are normal.  Neck: Normal range of motion. Neck supple.  Cardiovascular: Normal rate.   Pulmonary/Chest: Effort normal.  Abdominal: Bowel sounds are normal.  Neurological: She is alert and oriented to person, place, and time. Coordination normal.  Pt has horizontal nystagmus Cerebellar exam reveals no dysmetria. NIHSS - 0  Skin: Skin is warm and dry.  Nursing note and vitals reviewed.    ED Treatments / Results  Labs (all labs ordered are listed, but only abnormal  results are displayed) Labs Reviewed  COMPREHENSIVE METABOLIC PANEL - Abnormal; Notable for the following:       Result Value   CO2 21 (*)    Glucose, Bld 197 (*)    Creatinine, Ser 1.11 (*)    Albumin 3.4 (*)    ALT 11 (*)    GFR calc non Af Amer 52 (*)    GFR calc Af Amer 60 (*)    All other components within normal limits  CBC - Abnormal; Notable for the following:    RBC 3.55 (*)    Hemoglobin 10.0 (*)    HCT 33.9 (*)    MCHC  29.5 (*)    All other components within normal limits  I-STAT CHEM 8, ED - Abnormal; Notable for the following:    BUN 23 (*)    Creatinine, Ser 1.10 (*)    Glucose, Bld 191 (*)    Calcium, Ion 1.13 (*)    Hemoglobin 10.5 (*)    HCT 31.0 (*)    All other components within normal limits  CBG MONITORING, ED - Abnormal; Notable for the following:    Glucose-Capillary 189 (*)    All other components within normal limits  LIPASE, BLOOD  PROTIME-INR  APTT  DIFFERENTIAL  I-STAT TROPOININ, ED  I-STAT TROPOININ, ED    EKG  EKG Interpretation  Date/Time:  Saturday May 12 2017 05:32:06 EDT Ventricular Rate:  80 PR Interval:    QRS Duration: 99 QT Interval:  397 QTC Calculation: 458 R Axis:   30 Text Interpretation:  Sinus rhythm Low voltage, precordial leads No acute changes No significant change since last tracing Confirmed by Varney Biles [phone]) on 05/12/2017 6:28:14 AM Also confirmed by Varney Biles [phone]), editor Drema Pry ([place])  on 05/12/2017 8:02:26 AM       Radiology No results found.  Procedures Procedures (including critical care time)  Medications Ordered in ED Medications  ondansetron (ZOFRAN-ODT) disintegrating tablet 8 mg (8 mg Oral Given 05/12/17 0541)  ondansetron (ZOFRAN) injection 4 mg (4 mg Intravenous Given 05/12/17 0807)  sodium chloride 0.9 % bolus 1,000 mL (0 mLs Intravenous Stopped 05/12/17 1113)  meclizine (ANTIVERT) tablet 25 mg (25 mg Oral Given 05/12/17 1129)     Initial Impression /  Assessment and Plan / ED Course  I have reviewed the triage vital signs and the nursing notes.  Pertinent labs & imaging results that were available during my care of the patient were reviewed by me and considered in my medical decision making (see chart for details).     Pt with vertigo and dizziness.  DDx includes: Central vertigo:  Tumor  Stroke  ICH  Vertebrobasilar TIA  Peripheral Vertigo:  BPPV  Vestibular neuritis  Meniere disease  Migrainous vertigo  Ear Infection    Based on patient having constant spinning, event when she is supine - we will get  Stroke workup going. If imaging neg, treat as peripheral vertigo.   Final Clinical Impressions(s) / ED Diagnoses   Final diagnoses:  Vertigo    New Prescriptions Discharge Medication List as of 05/12/2017 10:57 AM    START taking these medications   Details  meclizine (ANTIVERT) 25 MG tablet Take 1 tablet (25 mg total) by mouth 3 (three) times daily as needed for dizziness., Starting Sat 05/12/2017, Print         Varney Biles, MD 05/15/17 2212

## 2017-05-24 ENCOUNTER — Other Ambulatory Visit: Payer: Self-pay | Admitting: Family Medicine

## 2017-05-24 NOTE — Telephone Encounter (Signed)
Pt called back to schedule DM check. Please renew meds.

## 2017-05-24 NOTE — Telephone Encounter (Signed)
Left message for pt to call back and schedule an appt for med check. Once appt is schedule we can refill med for 30 days

## 2017-05-24 NOTE — Telephone Encounter (Signed)
Refilled med

## 2017-05-29 ENCOUNTER — Encounter: Payer: Self-pay | Admitting: Family Medicine

## 2017-05-29 ENCOUNTER — Ambulatory Visit (INDEPENDENT_AMBULATORY_CARE_PROVIDER_SITE_OTHER): Payer: Medicare Other | Admitting: Family Medicine

## 2017-05-29 VITALS — BP 120/72 | HR 88 | Wt 319.4 lb

## 2017-05-29 DIAGNOSIS — E781 Pure hyperglyceridemia: Secondary | ICD-10-CM

## 2017-05-29 DIAGNOSIS — E1169 Type 2 diabetes mellitus with other specified complication: Secondary | ICD-10-CM | POA: Diagnosis not present

## 2017-05-29 DIAGNOSIS — I10 Essential (primary) hypertension: Secondary | ICD-10-CM

## 2017-05-29 DIAGNOSIS — E669 Obesity, unspecified: Secondary | ICD-10-CM | POA: Diagnosis not present

## 2017-05-29 LAB — BASIC METABOLIC PANEL
BUN: 20 mg/dL (ref 7–25)
CO2: 26 mmol/L (ref 20–31)
Calcium: 9.4 mg/dL (ref 8.6–10.4)
Chloride: 100 mmol/L (ref 98–110)
Creat: 1.21 mg/dL — ABNORMAL HIGH (ref 0.50–0.99)
Glucose, Bld: 147 mg/dL — ABNORMAL HIGH (ref 65–99)
Potassium: 4.6 mmol/L (ref 3.5–5.3)
Sodium: 137 mmol/L (ref 135–146)

## 2017-05-29 LAB — POCT GLYCOSYLATED HEMOGLOBIN (HGB A1C): Hemoglobin A1C: 7

## 2017-05-29 LAB — LIPID PANEL
Cholesterol: 210 mg/dL — ABNORMAL HIGH (ref <200)
HDL: 29 mg/dL — ABNORMAL LOW (ref >50)
LDL Cholesterol: 123 mg/dL — ABNORMAL HIGH (ref <100)
Total CHOL/HDL Ratio: 7.2 Ratio — ABNORMAL HIGH (ref <5.0)
Triglycerides: 289 mg/dL — ABNORMAL HIGH (ref <150)
VLDL: 58 mg/dL — ABNORMAL HIGH (ref <30)

## 2017-05-29 NOTE — Patient Instructions (Addendum)
Your hemoglobin A1c today is 7.0%. This is up from your previous readings.   Start checking your blood sugars 3 times weekly. Goal fasting blood sugar is 90-130.   Continue on your current medications.   Call and schedule your diabetic eye exam.   Cut back on sugar intake (sweet tea, sweets, cookies, etc). Increase water intake.   Try to walk more for exercise as tolerated.   Call me in 4 weeks with your blood sugar readings please.   We will have you return in 3 months for diabetes and other chronic health conditions.    Carbohydrate Counting for Diabetes Mellitus, Adult Carbohydrate counting is a method for keeping track of how many carbohydrates you eat. Eating carbohydrates naturally increases the amount of sugar (glucose) in the blood. Counting how many carbohydrates you eat helps keep your blood glucose within normal limits, which helps you manage your diabetes (diabetes mellitus). It is important to know how many carbohydrates you can safely have in each meal. This is different for every person. A diet and nutrition specialist (registered dietitian) can help you make a meal plan and calculate how many carbohydrates you should have at each meal and snack. Carbohydrates are found in the following foods:  Grains, such as breads and cereals.  Dried beans and soy products.  Starchy vegetables, such as potatoes, peas, and corn.  Fruit and fruit juices.  Milk and yogurt.  Sweets and snack foods, such as cake, cookies, candy, chips, and soft drinks.  How do I count carbohydrates? There are two ways to count carbohydrates in food. You can use either of the methods or a combination of both. Reading "Nutrition Facts" on packaged food The "Nutrition Facts" list is included on the labels of almost all packaged foods and beverages in the [place] It includes:  The serving size.  Information about nutrients in each serving, including the grams (g) of carbohydrate per serving.  To use  the "Nutrition Facts":  Decide how many servings you will have.  Multiply the number of servings by the number of carbohydrates per serving.  The resulting number is the total amount of carbohydrates that you will be having.  Learning standard serving sizes of other foods When you eat foods containing carbohydrates that are not packaged or do not include "Nutrition Facts" on the label, you need to measure the servings in order to count the amount of carbohydrates:  Measure the foods that you will eat with a food scale or measuring cup, if needed.  Decide how many standard-size servings you will eat.  Multiply the number of servings by 15. Most carbohydrate-rich foods have about 15 g of carbohydrates per serving. ? For example, if you eat 8 oz (170 g) of strawberries, you will have eaten 2 servings and 30 g of carbohydrates (2 servings x 15 g = 30 g).  For foods that have more than one food mixed, such as soups and casseroles, you must count the carbohydrates in each food that is included.  The following list contains standard serving sizes of common carbohydrate-rich foods. Each of these servings has about 15 g of carbohydrates:   hamburger bun or  English muffin.   oz (15 mL) syrup.   oz (14 g) jelly.  1 slice of bread.  1 six-inch tortilla.  3 oz (85 g) cooked rice or pasta.  4 oz (113 g) cooked dried beans.  4 oz (113 g) starchy vegetable, such as peas, corn, or potatoes.  4 oz (  113 g) hot cereal.  4 oz (113 g) mashed potatoes or  of a large baked potato.  4 oz (113 g) canned or frozen fruit.  4 oz (120 mL) fruit juice.  4-6 crackers.  6 chicken nuggets.  6 oz (170 g) unsweetened dry cereal.  6 oz (170 g) plain fat-free yogurt or yogurt sweetened with artificial sweeteners.  8 oz (240 mL) milk.  8 oz (170 g) fresh fruit or one small piece of fruit.  24 oz (680 g) popped popcorn.  Example of carbohydrate counting Sample meal  3 oz (85 g) chicken  breast.  6 oz (170 g) brown rice.  4 oz (113 g) corn.  8 oz (240 mL) milk.  8 oz (170 g) strawberries with sugar-free whipped topping. Carbohydrate calculation 1. Identify the foods that contain carbohydrates: ? Rice. ? Corn. ? Milk. ? Strawberries. 2. Calculate how many servings you have of each food: ? 2 servings rice. ? 1 serving corn. ? 1 serving milk. ? 1 serving strawberries. 3. Multiply each number of servings by 15 g: ? 2 servings rice x 15 g = 30 g. ? 1 serving corn x 15 g = 15 g. ? 1 serving milk x 15 g = 15 g. ? 1 serving strawberries x 15 g = 15 g. 4. Add together all of the amounts to find the total grams of carbohydrates eaten: ? 30 g + 15 g + 15 g + 15 g = 75 g of carbohydrates total. This information is not intended to replace advice given to you by your health care provider. Make sure you discuss any questions you have with your health care provider. Document Released: 12/04/2005 Document Revised: 06/23/2016 Document Reviewed: 05/17/2016 Elsevier Interactive Patient Education  [employer].

## 2017-05-29 NOTE — Progress Notes (Signed)
Subjective:    Patient ID: [name], female    DOB: May 05, 1951, 66 y.o.   MRN: [id number] Chief Complaint  Patient presents with  . diabetes    [name] is a 66 y.o. female who presents for follow-up of Type 2 diabetes mellitus and other chronic health conditions:  She had a knee replacement in 01/2017 by Dr. Ninfa Linden. States she is doing well and is planning to have her other knee replaced in August. States she is going to a neurologist July 2 for "poor circulation" in her feet. Reports mild numbness in her toes that has been ongoing, not worsening.    Last A1c 6.6% in February 2018 and 6.5% in October 2017.   Patient is checking home blood sugars but not very often.  Home blood sugar records: did not bring in readings. last fasting one she recalls was 145 How often is blood sugars being checked: once weekly  Current symptoms include: none  Patient denies foot ulcerations, hyperglycemia, hypoglycemia , increased appetite, nausea, paresthesia of the feet, polydipsia, polyuria and visual disturbances.  Patient is checking their feet daily. Any Foot concerns (callous, ulcer, wound, thickened nails, toenail fungus, skin fungus, hammer toe): none Last dilated eye exam: 2 years ago. Will schedule this.   Current treatments: medications . Medication compliance: excellent  Current diet: in general, an "unhealthy" diet Current exercise: none Known diabetic complications: none  The following portions of the patient's history were reviewed and updated as appropriate: allergies, current medications, past medical history, past social history and problem list.  ROS as in subjective above.     Objective:    Physical Exam Alert and in no distress.  Pharyngeal area is normal. Neck is supple without adenopathy or thyromegaly. Cardiac exam shows a regular sinus rhythm without murmurs or gallops. Lungs are clear to auscultation. Diabetic foot exam done and normal.   Blood pressure  120/72, pulse 88, weight (!) 319 lb 6.4 oz (144.9 kg).  Lab Review Diabetic Labs Latest Ref Rng & Units 05/29/2017 05/12/2017 05/12/2017 02/14/2017 02/07/2017  HbA1c - 7.0 - - - -  Microalbumin Not estab mg/dL - - - - -  Micro/Creat Ratio <30 mcg/mg creat - - - - -  Chol 125 - 200 mg/dL - - - - -  HDL >=46 mg/dL - - - - -  Calc LDL <130 mg/dL - - - - -  Triglycerides <150 mg/dL - - - - -  Creatinine 0.44 - 1.00 mg/dL - 1.10(H) 1.11(H) 0.9 1.20(H)   BP/Weight 05/29/2017 05/12/2017 02/27/2017 02/19/2017 4/69/6295  Systolic BP 284 132 440 102 725  Diastolic BP 72 73 77 77 66  Wt. (Lbs) 319.4 317 316 320 316  BMI 51.55 51.17 51 51.65 51   Foot/eye exam completion dates Latest Ref Rng & Units 05/29/2017 10/11/2016  Eye Exam No Retinopathy - -  Foot Form Completion - Done Done    [name]  reports that she has never smoked. She has never used smokeless tobacco. She reports that she does not drink alcohol or use drugs.     Assessment & Plan:    Diabetes mellitus type 2 in obese ([place]) - Plan: HgB D6U, Basic metabolic panel  Essential hypertension - Plan: Basic metabolic panel  Hypertriglyceridemia - Plan: Lipid panel  1. Rx changes: none discussed that she is still at goal in regards to A1c but encouraged her to pay closer attention to diet and exercise.  2. Education: Reviewed 'ABCs' of diabetes management (  respective goals in parentheses):  A1C (<7), blood pressure (<130/80), and cholesterol (LDL <100). 3. Compliance at present is estimated to be fair. Efforts to improve compliance (if necessary) will be directed at dietary modifications: cut back on sugar and carbohydrates, even fruit, increased exercise and regular blood sugar monitoring: 3  times weekly. 4. BP is at goal. Continue on current medications.  5. She is statin intolerant. She is taking omega 3 fish oil. Will need to recheck fasting lipid panel.  6. Follow up: 3 months and bring in bs readings.

## 2017-06-04 ENCOUNTER — Other Ambulatory Visit: Payer: Self-pay | Admitting: Family Medicine

## 2017-06-04 MED ORDER — PITAVASTATIN CALCIUM 2 MG PO TABS: 2.0000 mg | ORAL_TABLET | Freq: Every day | ORAL | 0 refills | Status: DC

## 2017-06-06 ENCOUNTER — Other Ambulatory Visit: Payer: Self-pay

## 2017-06-06 MED ORDER — PITAVASTATIN CALCIUM 2 MG PO TABS: 2.0000 mg | ORAL_TABLET | Freq: Every day | ORAL | 0 refills | Status: DC

## 2017-06-06 NOTE — Progress Notes (Signed)
Pt requested rx sent to [facility] instead of [facility]. RX resent. [name] December

## 2017-06-07 ENCOUNTER — Encounter: Payer: Self-pay | Admitting: Surgery

## 2017-06-11 ENCOUNTER — Other Ambulatory Visit (INDEPENDENT_AMBULATORY_CARE_PROVIDER_SITE_OTHER): Payer: Self-pay

## 2017-06-11 ENCOUNTER — Telehealth (INDEPENDENT_AMBULATORY_CARE_PROVIDER_SITE_OTHER): Payer: Self-pay | Admitting: Orthopaedic Surgery

## 2017-06-11 MED ORDER — TRAMADOL HCL 50 MG PO TABS: ORAL_TABLET | ORAL | 0 refills | Status: DC

## 2017-06-11 NOTE — Telephone Encounter (Signed)
Patient called needing Rx refilled (Tramadol) Patient said she can not take the other pain medicine that was prescribed for her. The number to contact patient is [phone]

## 2017-06-11 NOTE — Telephone Encounter (Signed)
Ok to call in tramadol 5o mg, 1-2 every 8-12 hours as needed, #60

## 2017-06-11 NOTE — Telephone Encounter (Signed)
Called into pharmacy

## 2017-06-11 NOTE — Telephone Encounter (Signed)
Please advise 

## 2017-06-18 ENCOUNTER — Ambulatory Visit (HOSPITAL_COMMUNITY)
Admission: RE | Admit: 2017-06-18 | Discharge: 2017-06-18 | Disposition: A | Discharge status: Home / Self Care | Payer: Medicare Other | Source: Ambulatory Visit | Attending: Surgery | Admitting: Surgery

## 2017-06-18 ENCOUNTER — Other Ambulatory Visit: Payer: Self-pay | Admitting: *Deleted

## 2017-06-18 ENCOUNTER — Ambulatory Visit (INDEPENDENT_AMBULATORY_CARE_PROVIDER_SITE_OTHER): Payer: Medicare Other | Admitting: Surgery

## 2017-06-18 ENCOUNTER — Encounter: Payer: Self-pay | Admitting: Surgery

## 2017-06-18 VITALS — BP 144/97 | HR 101 | Temp 98.3°F | Resp 20 | Ht 66.0 in | Wt 318.0 lb

## 2017-06-18 DIAGNOSIS — I739 Peripheral vascular disease, unspecified: Secondary | ICD-10-CM | POA: Diagnosis not present

## 2017-06-18 NOTE — Progress Notes (Signed)
Vascular and Vein Specialist of [facility]  Patient name: [name] MRN: [id number] DOB: 09/18/1951 Sex: female   REQUESTING PROVIDER:2    Dr. Derl Barrow   REASON FOR CONSULT:    PAD  HISTORY OF PRESENT ILLNESS:   [name] is a 66 y.o. female, who is Referred today for evaluation of poor circulation.  She underwent a screening exam by her insurance, May which suggests that she had poor circulation.  She does not endorse cramping with walking.  She does not have any nonhealing wounds.  She does not have rest pain.    She suffers from hypercholesterolemia.  She is intolerant of atorvastatin secondary to leg cramps.  She is tolerating a new statin.  She suffers from type 2 diabetes.  Her most recent hemoglobin A1c was 6.5.  She is medically managed for hypertension with an ARB.  She underwent a left total knee replacement in February 2018 by Dr. Ninfa Linden.  She is planning on having her right leg done later this year.  PAST MEDICAL HISTORY    Past Medical History:  Diagnosis Date  . Abdominal hernia   . Arthritis    knees, back, R ankle   . Diabetes mellitus   . Edema    legs  . GERD (gastroesophageal reflux disease)   . HOH (hard of hearing)   . Hypertension   . Obesity    morbid obesity     FAMILY HISTORY   Family History  Problem Relation Age of Onset  . Hypertension Mother   . Kidney disease Mother   . Diabetes Father   . Hypertension Father   . Diabetes Sister   . Hypertension Sister   . Diabetes Brother   . Hypertension Brother   . Heart disease Brother 51       heart attack  . Hypertension Sister     SOCIAL HISTORY:   Social History   Social History  . Marital status: Single    Spouse name: N/A  . Number of children: 2  . Years of education: N/A   Occupational History  . UNEMPLOYED Unemployed   Social History Main Topics  . Smoking status: Never Smoker  . Smokeless tobacco: Never Used  . Alcohol use  No  . Drug use: No  . Sexual activity: No   Other Topics Concern  . Not on file   Social History Narrative  . No narrative on file    ALLERGIES:    Allergies  Allergen Reactions  . Other Other (See Comments)    NO Blood or Blood products  . Sulfa Antibiotics Rash    CURRENT MEDICATIONS:    Current Outpatient Prescriptions  Medication Sig Dispense Refill  . cholecalciferol (VITAMIN D) 1000 units tablet Take 1,000 Units by mouth daily.     Marland Kitchen glimepiride (AMARYL) 4 MG tablet TAKE 1 TABLET EVERY DAY WITH BREAKFAST (Patient taking differently: Take 4 mg by mouth daily with breakfast. ) 90 tablet 0  . losartan-hydrochlorothiazide (HYZAAR) 100-12.5 MG tablet TAKE 1 TABLET BY MOUTH  DAILY 90 tablet 0  . meclizine (ANTIVERT) 25 MG tablet Take 1 tablet (25 mg total) by mouth 3 (three) times daily as needed for dizziness. 21 tablet 0  . metFORMIN (GLUCOPHAGE) 1000 MG tablet TAKE 1 TABLET BY MOUTH TWO  TIMES DAILY WITH MEALS 180 tablet 0  . Multiple Vitamin (MULTIVITAMIN) tablet Take 1 tablet by mouth daily.      . Omega-3 Fatty Acids (EQL OMEGA 3 FISH  OIL) 1000 MG CAPS Take 1,000 mg by mouth daily.     . Pitavastatin Calcium 2 MG TABS Take 1 tablet (2 mg total) by mouth daily. 30 tablet 0  . ranitidine (ZANTAC) 150 MG tablet Take 1 tablet (150 mg total) by mouth 2 (two) times daily. 180 tablet 0  . traMADol (ULTRAM) 50 MG tablet 1-2 every 8-12 prn pain 60 tablet 0  . Vitamin E (E-400 PO) Take 1 capsule by mouth daily.      No current facility-administered medications for this visit.     REVIEW OF SYSTEMS:   [X]  denotes positive finding, [ ]  denotes negative finding Cardiac  Comments:  Chest pain or chest pressure:    Shortness of breath upon exertion:    Short of breath when lying flat:    Irregular heart rhythm:        Vascular    Pain in calf, thigh, or hip brought on by ambulation:    Pain in feet at night that wakes you up from your sleep:  x   Blood clot in your veins:     Leg swelling:         Pulmonary    Oxygen at home:    Productive cough:     Wheezing:         Neurologic    Sudden weakness in arms or legs:     Sudden numbness in arms or legs:     Sudden onset of difficulty speaking or slurred speech:    Temporary loss of vision in one eye:     Problems with dizziness:         Gastrointestinal    Blood in stool:      Vomited blood:         Genitourinary    Burning when urinating:     Blood in urine:        Psychiatric    Major depression:         Hematologic    Bleeding problems:    Problems with blood clotting too easily:        Skin    Rashes or ulcers:        Constitutional    Fever or chills:     PHYSICAL EXAM:   There were no vitals filed for this visit.  GENERAL: The patient is a well-nourished female, in no acute distress. The vital signs are documented above. CARDIAC: There is a regular rate and rhythm.  VASCULAR: Palpable dorsalis pedis pulse bilaterally.  1+ pitting edema bilaterally. PULMONARY: Nonlabored respirations ABDOMEN: Soft and non-tender with normal pitched bowel sounds.  MUSCULOSKELETAL: There are no major deformities or cyanosis. NEUROLOGIC: No focal weakness or paresthesias are detected. SKIN: There are no ulcers or rashes noted. PSYCHIATRIC: The patient has a normal affect.  STUDIES:   I have reviewed her ABIs. R= 1.11, triphasic.  Toe pressure is 133 L= 1.11, triphasic.  Toe pressure is 135  ASSESSMENT and PLAN   The patient has palpable pedal pulses and a normal ankle-brachial index with triphasic waveforms and normal toe pressures.  I have reassured her that she does not have any evidence of vascular insufficiency to either lower extremity.  She is concerned about a varix in her right leg.  She does have mild swelling.  I would like for her to recover from bilateral knee replacements before addressing her venous insufficiency.  She would benefit from compression stockings currently to help  with her mild edema.  She will contact me after she has recovered from her knee replacement if she wants to pursue evaluation of her varicose veins.   Annamarie Major, MD Vascular and Vein Specialists of [facility] [phone] Pager [phone]

## 2017-06-19 ENCOUNTER — Other Ambulatory Visit: Payer: Self-pay | Admitting: Family Medicine

## 2017-07-03 ENCOUNTER — Encounter (INDEPENDENT_AMBULATORY_CARE_PROVIDER_SITE_OTHER): Payer: Self-pay | Admitting: Orthopaedic Surgery

## 2017-07-03 ENCOUNTER — Ambulatory Visit (INDEPENDENT_AMBULATORY_CARE_PROVIDER_SITE_OTHER): Payer: Medicare Other | Admitting: Physician Assistant

## 2017-07-03 DIAGNOSIS — Z96652 Presence of left artificial knee joint: Secondary | ICD-10-CM

## 2017-07-03 DIAGNOSIS — M1711 Unilateral primary osteoarthritis, right knee: Secondary | ICD-10-CM | POA: Diagnosis not present

## 2017-07-03 NOTE — Progress Notes (Signed)
Office Visit Note   Patient: [name]           Date of Birth: Nov 23, 1951           MRN: [id number] Visit Date: 07/03/2017              Requested by: Kaylee Rm, NP-C [place], [name] [id number] PCP: Kaylee Rm, NP-C   Assessment & Plan: Visit Diagnoses:  1. Unilateral primary osteoarthritis, right knee   2. Status post total left knee replacement     Plan:  Continue to work on strengthening left knee. In regards to her right knee she has end-stage severe arthritis. She's failed conservative treatment. At this point in time she would like to proceed with a right total knee arthroplasty sometime in September. We'll see her back 2 weeks postop no x-rays at that time.  Follow-Up Instructions: Return in about 2 weeks (around 07/17/2017) for post op.   Orders:  No orders of the defined types were placed in this encounter.  No orders of the defined types were placed in this encounter.     Procedures: No procedures performed   Clinical Data: No additional findings.   Subjective: Chief Complaint  Patient presents with  . Follow-up    left TKA 02/06/17    HPI [name] returns today 5 months status post left total knee arthroplasty. She states left knee is doing very well she is having no pain in the knee. Her right knee however causes her significant pain. She noted she has a lot of a painful cracking/popping in the knee. She has known end-stage osteoarthritis of the right knee. She's like to set surgery up for sometime in September.   Review of Systems  Constitutional: Negative for chills and fever.  Respiratory: Negative for shortness of breath.   Cardiovascular: Negative for chest pain.  Gastrointestinal: Negative for nausea and vomiting.  Otherwise please see history of present illness   Objective: Vital Signs: There were no vitals taken for this visit.  Physical Exam  Constitutional: She is oriented to person, place, and time.  She appears well-developed and well-nourished. No distress.  Pulmonary/Chest: Effort normal.  Neurological: She is alert and oriented to person, place, and time.  Skin: She is not diaphoretic.  Psychiatric: She has a normal mood and affect. Her behavior is normal.    Ortho Exam Left knee she has full extension flexion to 110-115. No instability valgus varus stressing. Surgical incision is well-healed. Right knee significant crepitus with passive range of motion. Tenderness along the medial joint line. No effusion abnormal warmth erythema ecchymosis of either knee Specialty Comments:  No specialty comments available.  Imaging: No results found.   PMFS History: Patient Active Problem List   Diagnosis Date Noted  . Acute blood loss as cause of postoperative anemia 02/16/2017  . GERD (gastroesophageal reflux disease) 02/16/2017  . Vitamin D deficiency 02/16/2017  . 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 Unilateral primary osteoarthritis, right knee 01/10/2017  . Unilateral primary osteoarthritis, left knee 01/10/2017  . Subclinical hypothyroidism 10/11/2016  . Decreased hearing of left ear 10/11/2016  . Hypertension   . Diabetes mellitus type 2 in obese ([place]) 12/31/2015  . Hypertriglyceridemia 08/30/2015  . Dyspnea 04/02/2013  . Cough 02/12/2013  . Abdominal pain, left lateral 04/03/2012  . Edema 08/22/2011  . Tachycardia 08/22/2011  . Diabetes ([place]) 10/16/2007  . OBESITY, MORBID 10/16/2007  . Essential hypertension 10/16/2007  . Osteoarthrosis involving lower  leg 10/16/2007   Past Medical History:  Diagnosis Date  . Abdominal hernia   . Arthritis    knees, back, R ankle   . Diabetes mellitus   . Edema    legs  . GERD (gastroesophageal reflux disease)   . HOH (hard of hearing)   . Hypertension   . Obesity    morbid obesity    Family History  Problem Relation Age of Onset  . Hypertension Mother   . Kidney disease Mother   . Diabetes Father   .  Hypertension Father   . Diabetes Sister   . Hypertension Sister   . Diabetes Brother   . Hypertension Brother   . Heart disease Brother 9       heart attack  . Hypertension Sister     Past Surgical History:  Procedure Laterality Date  . [place]?   right  . CARPAL TUNNEL RELEASE Left 07/10/2014   Procedure: LEFT ENDOSCOPIC CARPAL TUNNEL RELEASE ;  Surgeon: Jolyn Nap, MD;  Location: [place];  Service: Orthopedics;  Laterality: Left;  . COLONOSCOPY    . HERNIA REPAIR  07/03/2011   abd hernia  . NOSE SURGERY  2008   sinus  . TOTAL KNEE ARTHROPLASTY Left 02/06/2017   Procedure: LEFT TOTAL KNEE ARTHROPLASTY;  Surgeon: Mcarthur Rossetti, MD;  Location: [place];  Service: Orthopedics;  Laterality: Left;  . TUBAL LIGATION     Social History   Occupational History  . UNEMPLOYED Unemployed   Social History Main Topics  . Smoking status: Never Smoker  . Smokeless tobacco: Never Used  . Alcohol use No  . Drug use: No  . Sexual activity: No

## 2017-07-17 ENCOUNTER — Telehealth (INDEPENDENT_AMBULATORY_CARE_PROVIDER_SITE_OTHER): Payer: Self-pay | Admitting: Orthopaedic Surgery

## 2017-07-17 NOTE — Telephone Encounter (Signed)
Please advise 

## 2017-07-17 NOTE — Telephone Encounter (Signed)
PT REQUESTS REFILL OF TRAMADOL PLEASE.  [phone]

## 2017-07-18 ENCOUNTER — Other Ambulatory Visit (INDEPENDENT_AMBULATORY_CARE_PROVIDER_SITE_OTHER): Payer: Self-pay

## 2017-07-18 DIAGNOSIS — E119 Type 2 diabetes mellitus without complications: Secondary | ICD-10-CM | POA: Diagnosis not present

## 2017-07-18 LAB — HM DIABETES EYE EXAM

## 2017-07-18 MED ORDER — TRAMADOL HCL 50 MG PO TABS: ORAL_TABLET | ORAL | 0 refills | Status: DC

## 2017-07-18 NOTE — Telephone Encounter (Signed)
Called into pharmacy

## 2017-07-18 NOTE — Telephone Encounter (Signed)
Ok to refill 

## 2017-07-20 ENCOUNTER — Telehealth: Payer: Self-pay | Admitting: Family Medicine

## 2017-07-20 ENCOUNTER — Other Ambulatory Visit: Payer: Self-pay

## 2017-07-20 MED ORDER — PITAVASTATIN CALCIUM 2 MG PO TABS: 2.0000 mg | ORAL_TABLET | Freq: Every day | ORAL | 0 refills | Status: DC

## 2017-07-20 NOTE — Telephone Encounter (Signed)
I have sent med in

## 2017-07-20 NOTE — Telephone Encounter (Signed)
Pt called for refills of pitavastatin. Please send to [facility]. Pt can be reached [phone]

## 2017-07-23 ENCOUNTER — Encounter: Payer: Self-pay | Admitting: Family Medicine

## 2017-08-08 ENCOUNTER — Other Ambulatory Visit (INDEPENDENT_AMBULATORY_CARE_PROVIDER_SITE_OTHER): Payer: Self-pay | Admitting: Orthopaedic Surgery

## 2017-08-09 ENCOUNTER — Other Ambulatory Visit (INDEPENDENT_AMBULATORY_CARE_PROVIDER_SITE_OTHER): Payer: Self-pay | Admitting: Physician Assistant

## 2017-08-15 ENCOUNTER — Encounter (HOSPITAL_COMMUNITY): Payer: Self-pay

## 2017-08-15 ENCOUNTER — Encounter (HOSPITAL_COMMUNITY)
Admission: RE | Admit: 2017-08-15 | Discharge: 2017-08-15 | Disposition: A | Discharge status: Home / Self Care | Payer: Medicare Other | Source: Ambulatory Visit | Attending: Orthopaedic Surgery | Admitting: Orthopaedic Surgery

## 2017-08-15 DIAGNOSIS — M1711 Unilateral primary osteoarthritis, right knee: Secondary | ICD-10-CM | POA: Insufficient documentation

## 2017-08-15 HISTORY — DX: Dizziness and giddiness: R42

## 2017-08-15 LAB — BASIC METABOLIC PANEL
Anion gap: 10 (ref 5–15)
BUN: 24 mg/dL — ABNORMAL HIGH (ref 6–20)
CO2: 25 mmol/L (ref 22–32)
Calcium: 9.5 mg/dL (ref 8.9–10.3)
Chloride: 103 mmol/L (ref 101–111)
Creatinine, Ser: 1.29 mg/dL — ABNORMAL HIGH (ref 0.44–1.00)
GFR calc Af Amer: 50 mL/min — ABNORMAL LOW (ref >60)
GFR calc non Af Amer: 43 mL/min — ABNORMAL LOW (ref >60)
Glucose, Bld: 156 mg/dL — ABNORMAL HIGH (ref 65–99)
Potassium: 4.2 mmol/L (ref 3.5–5.1)
Sodium: 138 mmol/L (ref 135–145)

## 2017-08-15 LAB — SURGICAL PCR SCREEN
MRSA, PCR: NEGATIVE
Staphylococcus aureus: NEGATIVE

## 2017-08-15 LAB — CBC
HCT: 36.8 % (ref 36.0–46.0)
Hemoglobin: 10.9 g/dL — ABNORMAL LOW (ref 12.0–15.0)
MCH: 28.4 pg (ref 26.0–34.0)
MCHC: 29.6 g/dL — ABNORMAL LOW (ref 30.0–36.0)
MCV: 95.8 fL (ref 78.0–100.0)
Platelets: 291 10*3/uL (ref 150–400)
RBC: 3.84 MIL/uL — ABNORMAL LOW (ref 3.87–5.11)
RDW: 14.7 % (ref 11.5–15.5)
WBC: 8.1 10*3/uL (ref 4.0–10.5)

## 2017-08-15 LAB — GLUCOSE, CAPILLARY: Glucose-Capillary: 184 mg/dL — ABNORMAL HIGH (ref 65–99)

## 2017-08-15 LAB — HEMOGLOBIN A1C
Hgb A1c MFr Bld: 6.6 % — ABNORMAL HIGH (ref 4.8–5.6)
Mean Plasma Glucose: 142.72 mg/dL

## 2017-08-15 LAB — NO BLOOD PRODUCTS

## 2017-08-15 NOTE — Progress Notes (Signed)
PCP - Harland Dingwall, NP Cardiologist - patient denies  Chest x-ray - n/a EKG - 05/13/2017 Stress Test - patient denies ECHO - 2012 Cardiac Cath - patient denies  Sleep Study - patient denies CPAP - patient denies   Fasting Blood Sugar - 170's Checks Blood Sugar 1 time a day    Patient denies shortness of breath, fever, cough and chest pain at PAT appointment   Patient verbalized understanding of instructions that were given to them at the PAT appointment. Patient was also instructed that they will need to review over the PAT instructions again at home before surgery.

## 2017-08-15 NOTE — Pre-Procedure Instructions (Addendum)
Ronna Herskowitz Koob  08/15/2017      [facility] [address], [place] [address] Phone: [phone] Fax: [phone]  [place], [facility] [address] [place] [address] [place] [address] Phone: [phone] Fax: [phone]    Your procedure is scheduled on Tuesday, August 21, 2017.  Report to [facility] Admitting at 10:15 A.M.  Call this number if you have problems the morning of surgery:  [phone]   Remember:  Do not eat food or drink liquids after midnight.  Continue all other medications as directed by your physician except follow these instructions about your medications   Take these medicines the morning of surgery with A SIP OF WATER: Meclizine (Antivert) - if needed Ranitidine (Zantac) Tramadol (Ultram)  7 days prior to surgery STOP taking any Aspirin, Aleve, Naproxen, Ibuprofen, Motrin, Advil, Goody's, BC's, all herbal medications, fish oil, and all vitamins  WHAT DO I DO ABOUT MY DIABETES MEDICATION?  Marland Kitchen Do not take oral diabetes medicines (Metformin, Glimepiride) the morning of surgery.    How to Manage Your Diabetes Before and After Surgery  Why is it important to control my blood sugar before and after surgery? . Improving blood sugar levels before and after surgery helps healing and can limit problems. . A way of improving blood sugar control is eating a healthy diet by: o  Eating less sugar and carbohydrates o  Increasing activity/exercise o  Talking with your doctor about reaching your blood sugar goals . High blood sugars (greater than 180 mg/dL) can raise your risk of infections and slow your recovery, so you will need to focus on controlling your diabetes during the weeks before surgery. . Make sure that the doctor who takes care of your diabetes knows about your planned surgery  including the date and location.  How do I manage my blood sugar before surgery? . Check your blood sugar at least 4 times a day, starting 2 days before surgery, to make sure that the level is not too high or low. o Check your blood sugar the morning of your surgery when you wake up and every 2 hours until you get to the Short Stay unit. . If your blood sugar is less than 70 mg/dL, you will need to treat for low blood sugar: o Do not take insulin. o Treat a low blood sugar (less than 70 mg/dL) with  cup of clear juice (cranberry or apple), 4 glucose tablets, OR glucose gel. o Recheck blood sugar in 15 minutes after treatment (to make sure it is greater than 70 mg/dL). If your blood sugar is not greater than 70 mg/dL on recheck, call [phone] for further instructions. . Report your blood sugar to the short stay nurse when you get to Short Stay.  . If you are admitted to the hospital after surgery: o Your blood sugar will be checked by the staff and you will probably be given insulin after surgery (instead of oral diabetes medicines) to make sure you have good blood sugar levels. o The goal for blood sugar control after surgery is 80-180 mg/dL.     Do not wear jewelry, make-up or nail polish.  Do not wear lotions, powders, or perfumes, or deoderant.  Do not shave 48 hours prior to surgery.  Men may shave face and neck.  Do  not bring valuables to the hospital.  [facility] is not responsible for any belongings or valuables.  Contacts, dentures or bridgework may not be worn into surgery.  Leave your suitcase in the car.  After surgery it may be brought to your room.  For patients admitted to the hospital, discharge time will be determined by your treatment team.  Patients discharged the day of surgery will not be allowed to drive home.   Name and phone number of your driver:    Special instructions:   [place]- Preparing For Surgery  Before surgery, you can play an important  role. Because skin is not sterile, your skin needs to be as free of germs as possible. You can reduce the number of germs on your skin by washing with CHG (chlorahexidine gluconate) Soap before surgery.  CHG is an antiseptic cleaner which kills germs and bonds with the skin to continue killing germs even after washing.  Please do not use if you have an allergy to CHG or antibacterial soaps. If your skin becomes reddened/irritated stop using the CHG.  Do not shave (including legs and underarms) for at least 48 hours prior to first CHG shower. It is OK to shave your face.  Please follow these instructions carefully.   1. Shower the NIGHT BEFORE SURGERY and the MORNING OF SURGERY with CHG.   2. If you chose to wash your hair, wash your hair first as usual with your normal shampoo.  3. After you shampoo, rinse your hair and body thoroughly to remove the shampoo.  4. Use CHG as you would any other liquid soap. You can apply CHG directly to the skin and wash gently with a scrungie or a clean washcloth.   5. Apply the CHG Soap to your body ONLY FROM THE NECK DOWN.  Do not use on open wounds or open sores. Avoid contact with your eyes, ears, mouth and genitals (private parts). Wash genitals (private parts) with your normal soap.  6. Wash thoroughly, paying special attention to the area where your surgery will be performed.  7. Thoroughly rinse your body with warm water from the neck down.  8. DO NOT shower/wash with your normal soap after using and rinsing off the CHG Soap.  9. Pat yourself dry with a CLEAN TOWEL.   10. Wear CLEAN PAJAMAS   11. Place CLEAN SHEETS on your bed the night of your first shower and DO NOT SLEEP WITH PETS.    Day of Surgery: Shower as stated above. Do not apply any deodorants/lotions. Please wear clean clothes to the hospital/surgery center.      Please read over the following fact sheets that you were given. Pain Booklet, Coughing and Deep Breathing, Total  Joint Packet, MRSA Information and Surgical Site Infection Prevention

## 2017-08-19 MED ORDER — SODIUM CHLORIDE 0.9 % IV SOLN
1000.0000 mg | INTRAVENOUS | Status: AC
  Administered 2017-08-21: 1000 mg via INTRAVENOUS
  Filled 2017-08-19: qty 1100

## 2017-08-19 MED ORDER — DEXTROSE 5 % IV SOLN
3.0000 g | INTRAVENOUS | Status: AC
  Administered 2017-08-21: 3 g via INTRAVENOUS
  Filled 2017-08-19: qty 3000

## 2017-08-21 ENCOUNTER — Inpatient Hospital Stay (HOSPITAL_COMMUNITY)
Admission: RE | Admit: 2017-08-21 | Discharge: 2017-08-24 | DRG: 470 | Disposition: A | Discharge status: Skilled Nursing Facility | Payer: Medicare Other | Source: Ambulatory Visit | Attending: Orthopaedic Surgery | Admitting: Orthopaedic Surgery

## 2017-08-21 ENCOUNTER — Inpatient Hospital Stay (HOSPITAL_COMMUNITY): Payer: Medicare Other

## 2017-08-21 ENCOUNTER — Inpatient Hospital Stay (HOSPITAL_COMMUNITY): Payer: Medicare Other | Admitting: Certified Registered Nurse Anesthetist

## 2017-08-21 ENCOUNTER — Encounter (HOSPITAL_COMMUNITY): Payer: Self-pay | Admitting: Urology

## 2017-08-21 ENCOUNTER — Other Ambulatory Visit: Payer: Self-pay | Admitting: Family Medicine

## 2017-08-21 ENCOUNTER — Encounter (HOSPITAL_COMMUNITY)
Admission: RE | Disposition: A | Discharge status: Skilled Nursing Facility | Payer: Self-pay | Source: Ambulatory Visit | Attending: Orthopaedic Surgery

## 2017-08-21 DIAGNOSIS — Z6841 Body Mass Index (BMI) 40.0 and over, adult: Secondary | ICD-10-CM | POA: Diagnosis not present

## 2017-08-21 DIAGNOSIS — M25561 Pain in right knee: Secondary | ICD-10-CM | POA: Diagnosis present

## 2017-08-21 DIAGNOSIS — Z96651 Presence of right artificial knee joint: Secondary | ICD-10-CM | POA: Diagnosis not present

## 2017-08-21 DIAGNOSIS — K219 Gastro-esophageal reflux disease without esophagitis: Secondary | ICD-10-CM | POA: Diagnosis present

## 2017-08-21 DIAGNOSIS — E039 Hypothyroidism, unspecified: Secondary | ICD-10-CM | POA: Diagnosis present

## 2017-08-21 DIAGNOSIS — Z7982 Long term (current) use of aspirin: Secondary | ICD-10-CM | POA: Diagnosis not present

## 2017-08-21 DIAGNOSIS — G8918 Other acute postprocedural pain: Secondary | ICD-10-CM | POA: Diagnosis not present

## 2017-08-21 DIAGNOSIS — E559 Vitamin D deficiency, unspecified: Secondary | ICD-10-CM | POA: Diagnosis present

## 2017-08-21 DIAGNOSIS — Z96652 Presence of left artificial knee joint: Secondary | ICD-10-CM | POA: Diagnosis present

## 2017-08-21 DIAGNOSIS — E781 Pure hyperglyceridemia: Secondary | ICD-10-CM | POA: Diagnosis present

## 2017-08-21 DIAGNOSIS — Z841 Family history of disorders of kidney and ureter: Secondary | ICD-10-CM | POA: Diagnosis not present

## 2017-08-21 DIAGNOSIS — I10 Essential (primary) hypertension: Secondary | ICD-10-CM | POA: Diagnosis not present

## 2017-08-21 DIAGNOSIS — M1711 Unilateral primary osteoarthritis, right knee: Secondary | ICD-10-CM | POA: Diagnosis present

## 2017-08-21 DIAGNOSIS — Z8249 Family history of ischemic heart disease and other diseases of the circulatory system: Secondary | ICD-10-CM | POA: Diagnosis not present

## 2017-08-21 DIAGNOSIS — Z882 Allergy status to sulfonamides status: Secondary | ICD-10-CM

## 2017-08-21 DIAGNOSIS — M6281 Muscle weakness (generalized): Secondary | ICD-10-CM | POA: Diagnosis not present

## 2017-08-21 DIAGNOSIS — E119 Type 2 diabetes mellitus without complications: Secondary | ICD-10-CM | POA: Diagnosis present

## 2017-08-21 DIAGNOSIS — R278 Other lack of coordination: Secondary | ICD-10-CM | POA: Diagnosis not present

## 2017-08-21 DIAGNOSIS — H9192 Unspecified hearing loss, left ear: Secondary | ICD-10-CM | POA: Diagnosis present

## 2017-08-21 DIAGNOSIS — Z833 Family history of diabetes mellitus: Secondary | ICD-10-CM

## 2017-08-21 DIAGNOSIS — Z7984 Long term (current) use of oral hypoglycemic drugs: Secondary | ICD-10-CM

## 2017-08-21 DIAGNOSIS — M1990 Unspecified osteoarthritis, unspecified site: Secondary | ICD-10-CM | POA: Diagnosis not present

## 2017-08-21 DIAGNOSIS — M1712 Unilateral primary osteoarthritis, left knee: Secondary | ICD-10-CM | POA: Diagnosis not present

## 2017-08-21 DIAGNOSIS — D62 Acute posthemorrhagic anemia: Secondary | ICD-10-CM | POA: Diagnosis not present

## 2017-08-21 DIAGNOSIS — R600 Localized edema: Secondary | ICD-10-CM | POA: Diagnosis not present

## 2017-08-21 DIAGNOSIS — Z471 Aftercare following joint replacement surgery: Secondary | ICD-10-CM | POA: Diagnosis not present

## 2017-08-21 DIAGNOSIS — S8002XA Contusion of left knee, initial encounter: Secondary | ICD-10-CM | POA: Diagnosis not present

## 2017-08-21 HISTORY — DX: Presence of right artificial knee joint: Z96.651

## 2017-08-21 HISTORY — PX: TOTAL KNEE ARTHROPLASTY: SHX125

## 2017-08-21 LAB — GLUCOSE, CAPILLARY
Glucose-Capillary: 156 mg/dL — ABNORMAL HIGH (ref 65–99)
Glucose-Capillary: 176 mg/dL — ABNORMAL HIGH (ref 65–99)
Glucose-Capillary: 205 mg/dL — ABNORMAL HIGH (ref 65–99)
Glucose-Capillary: 219 mg/dL — ABNORMAL HIGH (ref 65–99)

## 2017-08-21 SURGERY — ARTHROPLASTY, KNEE, TOTAL
Anesthesia: Regional | Site: Knee | Laterality: Right

## 2017-08-21 MED ORDER — SODIUM CHLORIDE 0.9 % IR SOLN
Status: DC | PRN
  Administered 2017-08-21: 3000 mL

## 2017-08-21 MED ORDER — LACTATED RINGERS IV SOLN
INTRAVENOUS | Status: DC | PRN
  Administered 2017-08-21 (×2): via INTRAVENOUS

## 2017-08-21 MED ORDER — SUGAMMADEX SODIUM 200 MG/2ML IV SOLN
INTRAVENOUS | Status: DC | PRN
  Administered 2017-08-21: 200 mg via INTRAVENOUS

## 2017-08-21 MED ORDER — FENTANYL CITRATE (PF) 100 MCG/2ML IJ SOLN
INTRAMUSCULAR | Status: DC | PRN
  Administered 2017-08-21 (×5): 50 ug via INTRAVENOUS

## 2017-08-21 MED ORDER — MENTHOL 3 MG MT LOZG: 1.0000 | LOZENGE | OROMUCOSAL | Status: DC | PRN

## 2017-08-21 MED ORDER — LOSARTAN POTASSIUM 50 MG PO TABS
100.0000 mg | ORAL_TABLET | Freq: Every day | ORAL | Status: DC
  Administered 2017-08-21 – 2017-08-24 (×4): 100 mg via ORAL
  Filled 2017-08-21 (×4): qty 2

## 2017-08-21 MED ORDER — ONDANSETRON HCL 4 MG/2ML IJ SOLN
INTRAMUSCULAR | Status: DC | PRN
  Administered 2017-08-21: 4 mg via INTRAVENOUS

## 2017-08-21 MED ORDER — FENTANYL CITRATE (PF) 100 MCG/2ML IJ SOLN
50.0000 ug | Freq: Once | INTRAMUSCULAR | Status: AC
  Administered 2017-08-21: 50 ug via INTRAVENOUS

## 2017-08-21 MED ORDER — ROCURONIUM BROMIDE 100 MG/10ML IV SOLN
INTRAVENOUS | Status: DC | PRN
  Administered 2017-08-21: 50 mg via INTRAVENOUS

## 2017-08-21 MED ORDER — MECLIZINE HCL 25 MG PO TABS
25.0000 mg | ORAL_TABLET | Freq: Three times a day (TID) | ORAL | Status: DC | PRN
  Filled 2017-08-21: qty 1

## 2017-08-21 MED ORDER — OXYCODONE HCL 5 MG PO TABS
5.0000 mg | ORAL_TABLET | ORAL | Status: DC | PRN
  Administered 2017-08-21 – 2017-08-22 (×2): 15 mg via ORAL
  Administered 2017-08-22: 5 mg via ORAL
  Administered 2017-08-23 – 2017-08-24 (×6): 15 mg via ORAL
  Filled 2017-08-21 (×9): qty 3

## 2017-08-21 MED ORDER — FENTANYL CITRATE (PF) 100 MCG/2ML IJ SOLN
INTRAMUSCULAR | Status: AC
  Administered 2017-08-21: 50 ug via INTRAVENOUS
  Filled 2017-08-21: qty 2

## 2017-08-21 MED ORDER — DEXAMETHASONE SODIUM PHOSPHATE 10 MG/ML IJ SOLN
INTRAMUSCULAR | Status: DC | PRN
  Administered 2017-08-21: 5 mg via INTRAVENOUS

## 2017-08-21 MED ORDER — ACETAMINOPHEN 325 MG PO TABS
650.0000 mg | ORAL_TABLET | Freq: Four times a day (QID) | ORAL | Status: DC | PRN
  Administered 2017-08-23: 650 mg via ORAL
  Filled 2017-08-21: qty 2

## 2017-08-21 MED ORDER — ONDANSETRON HCL 4 MG PO TABS: 4.0000 mg | ORAL_TABLET | Freq: Four times a day (QID) | ORAL | Status: DC | PRN

## 2017-08-21 MED ORDER — DIPHENHYDRAMINE HCL 12.5 MG/5ML PO ELIX: 12.5000 mg | ORAL_SOLUTION | ORAL | Status: DC | PRN

## 2017-08-21 MED ORDER — METHOCARBAMOL 500 MG PO TABS
500.0000 mg | ORAL_TABLET | Freq: Four times a day (QID) | ORAL | Status: DC | PRN
  Administered 2017-08-21 – 2017-08-24 (×3): 500 mg via ORAL
  Filled 2017-08-21 (×2): qty 1

## 2017-08-21 MED ORDER — OXYCODONE HCL 5 MG PO TABS
5.0000 mg | ORAL_TABLET | Freq: Once | ORAL | Status: AC | PRN
  Administered 2017-08-21: 5 mg via ORAL

## 2017-08-21 MED ORDER — FENTANYL CITRATE (PF) 250 MCG/5ML IJ SOLN
INTRAMUSCULAR | Status: AC
  Filled 2017-08-21: qty 5

## 2017-08-21 MED ORDER — MIDAZOLAM HCL 2 MG/2ML IJ SOLN
1.0000 mg | Freq: Once | INTRAMUSCULAR | Status: AC
  Administered 2017-08-21: 1 mg via INTRAVENOUS

## 2017-08-21 MED ORDER — CHLORHEXIDINE GLUCONATE 4 % EX LIQD: 60.0000 mL | Freq: Once | CUTANEOUS | Status: DC

## 2017-08-21 MED ORDER — SODIUM CHLORIDE 0.9 % IV SOLN
INTRAVENOUS | Status: DC
  Administered 2017-08-21: 19:00:00 via INTRAVENOUS

## 2017-08-21 MED ORDER — PROPOFOL 10 MG/ML IV BOLUS
INTRAVENOUS | Status: AC
  Filled 2017-08-21: qty 20

## 2017-08-21 MED ORDER — HYDROMORPHONE HCL 1 MG/ML IJ SOLN
INTRAMUSCULAR | Status: AC
Start: 2017-08-21 — End: 2017-08-22
  Filled 2017-08-21: qty 1

## 2017-08-21 MED ORDER — MIDAZOLAM HCL 2 MG/2ML IJ SOLN
INTRAMUSCULAR | Status: AC
  Filled 2017-08-21: qty 2

## 2017-08-21 MED ORDER — RIVAROXABAN 10 MG PO TABS
10.0000 mg | ORAL_TABLET | Freq: Every day | ORAL | Status: DC
  Administered 2017-08-22 – 2017-08-24 (×3): 10 mg via ORAL
  Filled 2017-08-21 (×3): qty 1

## 2017-08-21 MED ORDER — HYDROMORPHONE HCL 1 MG/ML IJ SOLN
1.0000 mg | INTRAMUSCULAR | Status: DC | PRN
  Administered 2017-08-21 – 2017-08-22 (×5): 1 mg via INTRAVENOUS
  Filled 2017-08-21 (×5): qty 1

## 2017-08-21 MED ORDER — METOCLOPRAMIDE HCL 5 MG/ML IJ SOLN: 5.0000 mg | Freq: Three times a day (TID) | INTRAMUSCULAR | Status: DC | PRN

## 2017-08-21 MED ORDER — LACTATED RINGERS IV SOLN
INTRAVENOUS | Status: DC
  Administered 2017-08-21: 11:00:00 via INTRAVENOUS

## 2017-08-21 MED ORDER — INSULIN ASPART 100 UNIT/ML ~~LOC~~ SOLN
0.0000 [IU] | Freq: Three times a day (TID) | SUBCUTANEOUS | Status: DC
Start: 2017-08-21 — End: 2017-08-24
  Administered 2017-08-21: 7 [IU] via SUBCUTANEOUS
  Administered 2017-08-22: 3 [IU] via SUBCUTANEOUS
  Administered 2017-08-22: 4 [IU] via SUBCUTANEOUS
  Administered 2017-08-22: 7 [IU] via SUBCUTANEOUS
  Administered 2017-08-23 (×2): 4 [IU] via SUBCUTANEOUS
  Administered 2017-08-23: 3 [IU] via SUBCUTANEOUS
  Administered 2017-08-24 (×2): 4 [IU] via SUBCUTANEOUS

## 2017-08-21 MED ORDER — FENTANYL CITRATE (PF) 100 MCG/2ML IJ SOLN
INTRAMUSCULAR | Status: AC
  Filled 2017-08-21: qty 2

## 2017-08-21 MED ORDER — ARTIFICIAL TEARS OPHTHALMIC OINT
TOPICAL_OINTMENT | OPHTHALMIC | Status: DC | PRN
  Administered 2017-08-21: 1 via OPHTHALMIC

## 2017-08-21 MED ORDER — ONDANSETRON HCL 4 MG/2ML IJ SOLN: 4.0000 mg | Freq: Once | INTRAMUSCULAR | Status: DC | PRN

## 2017-08-21 MED ORDER — GLIMEPIRIDE 4 MG PO TABS
4.0000 mg | ORAL_TABLET | Freq: Every day | ORAL | Status: DC
  Administered 2017-08-22 – 2017-08-24 (×3): 4 mg via ORAL
  Filled 2017-08-21 (×3): qty 1

## 2017-08-21 MED ORDER — ACETAMINOPHEN 650 MG RE SUPP: 650.0000 mg | Freq: Four times a day (QID) | RECTAL | Status: DC | PRN

## 2017-08-21 MED ORDER — METHOCARBAMOL 1000 MG/10ML IJ SOLN
500.0000 mg | Freq: Four times a day (QID) | INTRAVENOUS | Status: DC | PRN
  Filled 2017-08-21: qty 5

## 2017-08-21 MED ORDER — LIDOCAINE HCL (CARDIAC) 20 MG/ML IV SOLN
INTRAVENOUS | Status: DC | PRN
  Administered 2017-08-21: 40 mg via INTRAVENOUS

## 2017-08-21 MED ORDER — CO Q-10 50 MG PO CAPS: 50.0000 mg | ORAL_CAPSULE | ORAL | Status: DC

## 2017-08-21 MED ORDER — METHOCARBAMOL 500 MG PO TABS
ORAL_TABLET | ORAL | Status: AC
  Filled 2017-08-21: qty 1

## 2017-08-21 MED ORDER — ONDANSETRON HCL 4 MG/2ML IJ SOLN: 4.0000 mg | Freq: Four times a day (QID) | INTRAMUSCULAR | Status: DC | PRN

## 2017-08-21 MED ORDER — ALUM & MAG HYDROXIDE-SIMETH 200-200-20 MG/5ML PO SUSP: 30.0000 mL | ORAL | Status: DC | PRN

## 2017-08-21 MED ORDER — POLYETHYLENE GLYCOL 3350 17 G PO PACK: 17.0000 g | PACK | Freq: Every day | ORAL | Status: DC | PRN

## 2017-08-21 MED ORDER — ADULT MULTIVITAMIN W/MINERALS CH
1.0000 | ORAL_TABLET | ORAL | Status: DC
  Administered 2017-08-21 – 2017-08-23 (×2): 1 via ORAL
  Filled 2017-08-21 (×3): qty 1

## 2017-08-21 MED ORDER — VITAMIN E 180 MG (400 UNIT) PO CAPS
400.0000 [IU] | ORAL_CAPSULE | ORAL | Status: DC
  Administered 2017-08-22 – 2017-08-24 (×2): 400 [IU] via ORAL
  Filled 2017-08-21 (×2): qty 1

## 2017-08-21 MED ORDER — VITAMIN D 1000 UNITS PO TABS
1000.0000 [IU] | ORAL_TABLET | ORAL | Status: DC
  Administered 2017-08-21 – 2017-08-23 (×2): 1000 [IU] via ORAL
  Filled 2017-08-21 (×3): qty 1

## 2017-08-21 MED ORDER — HYDROCHLOROTHIAZIDE 12.5 MG PO CAPS
12.5000 mg | ORAL_CAPSULE | Freq: Every day | ORAL | Status: DC
  Administered 2017-08-21 – 2017-08-24 (×4): 12.5 mg via ORAL
  Filled 2017-08-21 (×4): qty 1

## 2017-08-21 MED ORDER — FAMOTIDINE 20 MG PO TABS
20.0000 mg | ORAL_TABLET | Freq: Two times a day (BID) | ORAL | Status: DC
  Administered 2017-08-21 – 2017-08-24 (×6): 20 mg via ORAL
  Filled 2017-08-21 (×6): qty 1

## 2017-08-21 MED ORDER — 0.9 % SODIUM CHLORIDE (POUR BTL) OPTIME
TOPICAL | Status: DC | PRN
  Administered 2017-08-21: 1000 mL

## 2017-08-21 MED ORDER — PRAVASTATIN SODIUM 40 MG PO TABS
40.0000 mg | ORAL_TABLET | Freq: Every day | ORAL | Status: DC
  Administered 2017-08-21 – 2017-08-23 (×3): 40 mg via ORAL
  Filled 2017-08-21 (×3): qty 1

## 2017-08-21 MED ORDER — MIDAZOLAM HCL 5 MG/5ML IJ SOLN
INTRAMUSCULAR | Status: DC | PRN
  Administered 2017-08-21 (×2): 1 mg via INTRAVENOUS

## 2017-08-21 MED ORDER — PHENOL 1.4 % MT LIQD: 1.0000 | OROMUCOSAL | Status: DC | PRN

## 2017-08-21 MED ORDER — CEFAZOLIN SODIUM-DEXTROSE 2-4 GM/100ML-% IV SOLN
2.0000 g | Freq: Four times a day (QID) | INTRAVENOUS | Status: AC
  Administered 2017-08-21 – 2017-08-22 (×2): 2 g via INTRAVENOUS
  Filled 2017-08-21 (×3): qty 100

## 2017-08-21 MED ORDER — LOSARTAN POTASSIUM-HCTZ 100-12.5 MG PO TABS: 1.0000 | ORAL_TABLET | Freq: Every day | ORAL | Status: DC

## 2017-08-21 MED ORDER — METFORMIN HCL 500 MG PO TABS
1000.0000 mg | ORAL_TABLET | Freq: Two times a day (BID) | ORAL | Status: DC
  Administered 2017-08-21 – 2017-08-24 (×6): 1000 mg via ORAL
  Filled 2017-08-21 (×6): qty 2

## 2017-08-21 MED ORDER — OXYCODONE HCL 5 MG PO TABS
ORAL_TABLET | ORAL | Status: AC
  Filled 2017-08-21: qty 1

## 2017-08-21 MED ORDER — OXYCODONE HCL 5 MG/5ML PO SOLN: 5.0000 mg | Freq: Once | ORAL | Status: AC | PRN

## 2017-08-21 MED ORDER — DOCUSATE SODIUM 100 MG PO CAPS
100.0000 mg | ORAL_CAPSULE | Freq: Two times a day (BID) | ORAL | Status: DC
  Administered 2017-08-21 – 2017-08-24 (×6): 100 mg via ORAL
  Filled 2017-08-21 (×6): qty 1

## 2017-08-21 MED ORDER — FENTANYL CITRATE (PF) 100 MCG/2ML IJ SOLN
25.0000 ug | INTRAMUSCULAR | Status: DC | PRN
  Administered 2017-08-21 (×3): 50 ug via INTRAVENOUS

## 2017-08-21 MED ORDER — PROPOFOL 10 MG/ML IV BOLUS
INTRAVENOUS | Status: DC | PRN
  Administered 2017-08-21: 30 mg via INTRAVENOUS
  Administered 2017-08-21: 130 mg via INTRAVENOUS

## 2017-08-21 MED ORDER — METOCLOPRAMIDE HCL 5 MG PO TABS: 5.0000 mg | ORAL_TABLET | Freq: Three times a day (TID) | ORAL | Status: DC | PRN

## 2017-08-21 MED ORDER — INSULIN ASPART 100 UNIT/ML ~~LOC~~ SOLN
0.0000 [IU] | Freq: Every day | SUBCUTANEOUS | Status: DC
  Administered 2017-08-21 – 2017-08-22 (×2): 2 [IU] via SUBCUTANEOUS

## 2017-08-21 MED ORDER — MIDAZOLAM HCL 2 MG/2ML IJ SOLN
INTRAMUSCULAR | Status: AC
  Administered 2017-08-21: 1 mg via INTRAVENOUS
  Filled 2017-08-21: qty 2

## 2017-08-21 SURGICAL SUPPLY — 64 items
BANDAGE ACE 6X5 VEL STRL LF (GAUZE/BANDAGES/DRESSINGS) ×4 IMPLANT
BANDAGE ESMARK 6X9 LF (GAUZE/BANDAGES/DRESSINGS) ×1 IMPLANT
BLADE SAG 18X100X1.27 (BLADE) ×2 IMPLANT
BNDG ESMARK 6X9 LF (GAUZE/BANDAGES/DRESSINGS) ×2
BOWL SMART MIX CTS (DISPOSABLE) ×2 IMPLANT
CAPT KNEE TOTAL 3 ×2 IMPLANT
CEMENT BONE SIMPLEX SPEEDSET (Cement) ×4 IMPLANT
COVER SURGICAL LIGHT HANDLE (MISCELLANEOUS) ×2 IMPLANT
CUFF TOURNIQUET SINGLE 34IN LL (TOURNIQUET CUFF) ×2 IMPLANT
CUFF TOURNIQUET SINGLE 44IN (TOURNIQUET CUFF) IMPLANT
DRAPE EXTREMITY T 121X128X90 (DRAPE) ×2 IMPLANT
DRAPE HALF SHEET 40X57 (DRAPES) ×2 IMPLANT
DRAPE U-SHAPE 47X51 STRL (DRAPES) ×2 IMPLANT
DRSG PAD ABDOMINAL 8X10 ST (GAUZE/BANDAGES/DRESSINGS) ×4 IMPLANT
DURAPREP 26ML APPLICATOR (WOUND CARE) ×2 IMPLANT
ELECT CAUTERY BLADE 6.4 (BLADE) ×2 IMPLANT
ELECT REM PT RETURN 9FT ADLT (ELECTROSURGICAL) ×2
ELECTRODE REM PT RTRN 9FT ADLT (ELECTROSURGICAL) ×1 IMPLANT
FACESHIELD WRAPAROUND (MASK) ×4 IMPLANT
GAUZE SPONGE 4X4 12PLY STRL (GAUZE/BANDAGES/DRESSINGS) ×2 IMPLANT
GAUZE XEROFORM 1X8 LF (GAUZE/BANDAGES/DRESSINGS) ×2 IMPLANT
GLOVE BIOGEL PI IND STRL 6.5 (GLOVE) ×1 IMPLANT
GLOVE BIOGEL PI IND STRL 7.5 (GLOVE) ×1 IMPLANT
GLOVE BIOGEL PI IND STRL 8 (GLOVE) ×2 IMPLANT
GLOVE BIOGEL PI INDICATOR 6.5 (GLOVE) ×1
GLOVE BIOGEL PI INDICATOR 7.5 (GLOVE) ×1
GLOVE BIOGEL PI INDICATOR 8 (GLOVE) ×2
GLOVE ORTHO TXT STRL SZ7.5 (GLOVE) ×2 IMPLANT
GLOVE SURG ORTHO 8.0 STRL STRW (GLOVE) ×2 IMPLANT
GLOVE SURG SS PI 6.0 STRL IVOR (GLOVE) ×4 IMPLANT
GOWN STRL REUS W/ TWL LRG LVL3 (GOWN DISPOSABLE) ×1 IMPLANT
GOWN STRL REUS W/ TWL XL LVL3 (GOWN DISPOSABLE) ×2 IMPLANT
GOWN STRL REUS W/TWL LRG LVL3 (GOWN DISPOSABLE) ×1
GOWN STRL REUS W/TWL XL LVL3 (GOWN DISPOSABLE) ×2
HANDPIECE INTERPULSE COAX TIP (DISPOSABLE) ×1
IMMOBILIZER KNEE 22 (SOFTGOODS) ×2 IMPLANT
IMMOBILIZER KNEE 22 UNIV (SOFTGOODS) ×2 IMPLANT
KIT BASIN OR (CUSTOM PROCEDURE TRAY) ×2 IMPLANT
KIT ROOM TURNOVER OR (KITS) ×2 IMPLANT
MANIFOLD NEPTUNE II (INSTRUMENTS) ×2 IMPLANT
NDL SAFETY ECLIPSE 18X1.5 (NEEDLE) IMPLANT
NEEDLE HYPO 18GX1.5 SHARP (NEEDLE)
NS IRRIG 1000ML POUR BTL (IV SOLUTION) ×2 IMPLANT
PACK TOTAL JOINT (CUSTOM PROCEDURE TRAY) ×2 IMPLANT
PAD ARMBOARD 7.5X6 YLW CONV (MISCELLANEOUS) ×2 IMPLANT
PADDING CAST COTTON 6X4 STRL (CAST SUPPLIES) ×4 IMPLANT
SET HNDPC FAN SPRY TIP SCT (DISPOSABLE) ×1 IMPLANT
SET PAD KNEE POSITIONER (MISCELLANEOUS) ×2 IMPLANT
STAPLER VISISTAT 35W (STAPLE) ×2 IMPLANT
STRIP CLOSURE SKIN 1/2X4 (GAUZE/BANDAGES/DRESSINGS) ×2 IMPLANT
SUCTION FRAZIER HANDLE 10FR (MISCELLANEOUS) ×1
SUCTION TUBE FRAZIER 10FR DISP (MISCELLANEOUS) ×1 IMPLANT
SUT MNCRL AB 4-0 PS2 18 (SUTURE) IMPLANT
SUT VIC AB 0 CT1 27 (SUTURE) ×2
SUT VIC AB 0 CT1 27XBRD ANBCTR (SUTURE) ×2 IMPLANT
SUT VIC AB 1 CT1 27 (SUTURE) ×3
SUT VIC AB 1 CT1 27XBRD ANBCTR (SUTURE) ×3 IMPLANT
SUT VIC AB 2-0 CT1 27 (SUTURE) ×3
SUT VIC AB 2-0 CT1 TAPERPNT 27 (SUTURE) ×3 IMPLANT
SYR 50ML LL SCALE MARK (SYRINGE) IMPLANT
TOWEL OR 17X24 6PK STRL BLUE (TOWEL DISPOSABLE) ×2 IMPLANT
TOWEL OR 17X26 10 PK STRL BLUE (TOWEL DISPOSABLE) ×2 IMPLANT
TRAY CATH 16FR W/PLASTIC CATH (SET/KITS/TRAYS/PACK) IMPLANT
WRAP KNEE MAXI GEL POST OP (GAUZE/BANDAGES/DRESSINGS) ×2 IMPLANT

## 2017-08-21 NOTE — Progress Notes (Signed)
Spoke with Dr Linna Caprice re : no relief from pain even with meds

## 2017-08-21 NOTE — Anesthesia Preprocedure Evaluation (Signed)
Anesthesia Evaluation  Patient identified by MRN, date of birth, ID band Patient awake    Reviewed: Allergy & Precautions, NPO status , Patient's Chart, lab work & pertinent test results  Airway Mallampati: III  TM Distance: >3 FB Neck ROM: Full    Dental  (+) Edentulous Upper, Partial Lower, Dental Advisory Given   Pulmonary    breath sounds clear to auscultation       Cardiovascular hypertension,  Rhythm:Regular Rate:Normal     Neuro/Psych    GI/Hepatic   Endo/Other  diabetes  Renal/GU      Musculoskeletal   Abdominal (+) + obese,   Peds  Hematology   Anesthesia Other Findings   Reproductive/Obstetrics                             Anesthesia Physical Anesthesia Plan  ASA: III  Anesthesia Plan: General and Regional   Post-op Pain Management:    Induction: Intravenous  PONV Risk Score and Plan: Ondansetron and Metaclopromide  Airway Management Planned: Oral ETT  Additional Equipment:   Intra-op Plan:   Post-operative Plan: Extubation in OR  Informed Consent: I have reviewed the patients History and Physical, chart, labs and discussed the procedure including the risks, benefits and alternatives for the proposed anesthesia with the patient or authorized representative who has indicated his/her understanding and acceptance.   Dental advisory given  Plan Discussed with: CRNA and Anesthesiologist  Anesthesia Plan Comments:         Anesthesia Quick Evaluation

## 2017-08-21 NOTE — Anesthesia Postprocedure Evaluation (Signed)
Anesthesia Post Note  Patient: [name]  Procedure(s) Performed: Procedure(s) (LRB): RIGHT TOTAL KNEE ARTHROPLASTY (Right)     Patient location during evaluation: PACU Anesthesia Type: Regional Level of consciousness: awake, awake and alert and oriented Pain management: pain level controlled Vital Signs Assessment: post-procedure vital signs reviewed and stable Respiratory status: spontaneous breathing, nonlabored ventilation and respiratory function stable Cardiovascular status: blood pressure returned to baseline Anesthetic complications: no    Last Vitals:  Vitals:   08/21/17 1615 08/21/17 1649  BP: 132/68 (!) 149/75  Pulse: 83 87  Resp: 16 17  Temp: (!) 36.1 C 36.7 C  SpO2: (!) 89% 95%    Last Pain:  Vitals:   08/21/17 1649  TempSrc:   PainSc: 10-Worst pain ever                 Brentley Horrell COKER

## 2017-08-21 NOTE — Anesthesia Procedure Notes (Signed)
Anesthesia Regional Block: Adductor canal block   Pre-Anesthetic Checklist: ,, timeout performed, Correct Patient, Correct Site, Correct Laterality, Correct Procedure, Correct Position, site marked, Risks and benefits discussed,  Surgical consent,  Pre-op evaluation,  At surgeon's request and post-op pain management  Laterality: Right  Prep: chloraprep       Needles:  Injection technique: Single-shot  Needle Type: Echogenic Stimulator Needle     Needle Length: 9cm  Needle Gauge: 22     Additional Needles:   Procedures: ultrasound guided,,,,,,,,  Narrative:  Start time: 08/21/2017 11:38 AM End time: 08/21/2017 11:43 AM Injection made incrementally with aspirations every 5 mL.  Performed by: Personally   Additional Notes:  Cc 0.5% Bupivacaine with 1:200 epi injected easily

## 2017-08-21 NOTE — Progress Notes (Signed)
Orthopedic Tech Progress Note Patient Details:  [name] [birth year] [id number]  CPM Right Knee CPM Right Knee: On Right Knee Flexion (Degrees): 60 Right Knee Extension (Degrees): 0   [name] 08/21/2017, 3:56 PM

## 2017-08-21 NOTE — Brief Op Note (Signed)
08/21/2017  2:24 PM  PATIENT:  [name]  66 y.o. female  PRE-OPERATIVE DIAGNOSIS:  right knee osteoarthritis  POST-OPERATIVE DIAGNOSIS:  right knee osteoarthritis  PROCEDURE:  Procedure(s): RIGHT TOTAL KNEE ARTHROPLASTY (Right)  SURGEON:  Surgeon(s) and Role:    Mcarthur Rossetti, MD - Primary  PHYSICIAN ASSISTANT: Benita Stabile, PA-C  ANESTHESIA:   regional and general  EBL:  Total I/O In: 1000 [I.V.:1000] Out: -   COUNTS:  YES  TOURNIQUET:   Total Tourniquet Time Documented: Thigh (Right) - 56 minutes Total: Thigh (Right) - 56 minutes   DICTATION: .Other Dictation: Dictation Number [id number]  PLAN OF CARE: Admit to inpatient   PATIENT DISPOSITION:  PACU - hemodynamically stable.   Delay start of Pharmacological VTE agent (>24hrs) due to surgical blood loss or risk of bleeding: no

## 2017-08-21 NOTE — Transfer of Care (Signed)
Immediate Anesthesia Transfer of Care Note  Patient: [name]  Procedure(s) Performed: Procedure(s): RIGHT TOTAL KNEE ARTHROPLASTY (Right)  Patient Location: PACU  Anesthesia Type:GA combined with regional for post-op pain  Level of Consciousness: awake and alert   Airway & Oxygen Therapy: Patient Spontanous Breathing and Patient connected to nasal cannula oxygen  Post-op Assessment: Report given to RN and Post -op Vital signs reviewed and stable  Post vital signs: Reviewed and stable  Last Vitals:  Vitals:   08/21/17 1140 08/21/17 1145  BP:  (!) 142/71  Pulse: 81 80  Resp: 19 14  Temp:    SpO2: 100% 100%    Last Pain:  Vitals:   08/21/17 1040  TempSrc: Oral      Patients Stated Pain Goal: 5 (46/56/81 2751)  Complications: No apparent anesthesia complications

## 2017-08-21 NOTE — Anesthesia Procedure Notes (Signed)
Procedure Name: Intubation Date/Time: 08/21/2017 12:52 PM Performed by: Linna Caprice, DAVID Pre-anesthesia Checklist: Patient identified, Emergency Drugs available, Suction available and Patient being monitored Patient Re-evaluated:Patient Re-evaluated prior to induction Oxygen Delivery Method: Circle System Utilized Preoxygenation: Pre-oxygenation with 100% oxygen Induction Type: IV induction Ventilation: Mask ventilation without difficulty Laryngoscope Size: Mac and 4 Grade View: Grade I Tube type: Oral Tube size: 7.5 mm Number of attempts: 1 Airway Equipment and Method: Stylet and Oral airway Placement Confirmation: ETT inserted through vocal cords under direct vision,  positive ETCO2 and breath sounds checked- equal and bilateral Secured at: 21 cm Tube secured with: Tape Dental Injury: Teeth and Oropharynx as per pre-operative assessment  Comments: Vickii Penna, SRNA

## 2017-08-21 NOTE — H&P (Signed)
TOTAL KNEE ADMISSION H&P  Patient is being admitted for right total knee arthroplasty.  Subjective:  Chief Complaint:right knee pain.  HPI: [name], 66 y.o. female, has a history of pain and functional disability in the right knee due to arthritis and has failed non-surgical conservative treatments for greater than 12 weeks to includeNSAID's and/or analgesics, corticosteriod injections, viscosupplementation injections, flexibility and strengthening excercises, supervised PT with diminished ADL's post treatment, use of assistive devices, weight reduction as appropriate and activity modification.  Onset of symptoms was gradual, starting 5 years ago with gradually worsening course since that time. The patient noted no past surgery on the right knee(s).  Patient currently rates pain in the right knee(s) at 10 out of 10 with activity. Patient has night pain, worsening of pain with activity and weight bearing, pain that interferes with activities of daily living, pain with passive range of motion, crepitus and joint swelling.  Patient has evidence of subchondral sclerosis, periarticular osteophytes and joint space narrowing by imaging studies. There is no active infection.  Patient Active Problem List   Diagnosis Date Noted  . Acute blood loss as cause of postoperative anemia 02/16/2017  . GERD (gastroesophageal reflux disease) 02/16/2017  . Vitamin D deficiency 02/16/2017  . 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 Unilateral primary osteoarthritis, right knee 01/10/2017  . Unilateral primary osteoarthritis, left knee 01/10/2017  . Subclinical hypothyroidism 10/11/2016  . Decreased hearing of left ear 10/11/2016  . Hypertension   . Diabetes mellitus type 2 in obese ([place]) 12/31/2015  . Hypertriglyceridemia 08/30/2015  . Dyspnea 04/02/2013  . Cough 02/12/2013  . Abdominal pain, left lateral 04/03/2012  . Edema 08/22/2011  . Tachycardia 08/22/2011  . Diabetes ([place]) 10/16/2007   . OBESITY, MORBID 10/16/2007  . Essential hypertension 10/16/2007  . Osteoarthrosis involving lower leg 10/16/2007   Past Medical History:  Diagnosis Date  . Abdominal hernia   . Arthritis    knees, back, R ankle   . Diabetes mellitus   . Edema    legs  . GERD (gastroesophageal reflux disease)   . HOH (hard of hearing)   . Hypertension   . Obesity    morbid obesity  . Vertigo     Past Surgical History:  Procedure Laterality Date  . [place]?   right  . CARPAL TUNNEL RELEASE Left 07/10/2014   Procedure: LEFT ENDOSCOPIC CARPAL TUNNEL RELEASE ;  Surgeon: Jolyn Nap, MD;  Location: [facility];  Service: Orthopedics;  Laterality: Left;  . CARPAL TUNNEL RELEASE Right   . COLONOSCOPY    . HERNIA REPAIR  07/03/2011   abd hernia  . NOSE SURGERY  2008   sinus  . TOTAL KNEE ARTHROPLASTY Left 02/06/2017   Procedure: LEFT TOTAL KNEE ARTHROPLASTY;  Surgeon: Mcarthur Rossetti, MD;  Location: [facility];  Service: Orthopedics;  Laterality: Left;  . TUBAL LIGATION      Prescriptions Prior to Admission  Medication Sig Dispense Refill Last Dose  . aspirin EC 81 MG tablet Take 81 mg by mouth daily.   08/14/2017 at Unknown time  . cholecalciferol (VITAMIN D) 1000 units tablet Take 1,000 Units by mouth every other day.    08/14/2017 at Unknown time  . glimepiride (AMARYL) 4 MG tablet TAKE 1 TABLET BY MOUTH  EVERY DAY WITH BREAKFAST 90 tablet 1 08/20/2017 at Unknown time  . losartan-hydrochlorothiazide (HYZAAR) 100-12.5 MG tablet TAKE 1 TABLET BY MOUTH  DAILY 90 tablet 0 08/20/2017  at Unknown time  . metFORMIN (GLUCOPHAGE) 1000 MG tablet TAKE 1 TABLET BY MOUTH TWO  TIMES DAILY WITH MEALS 180 tablet 0 08/20/2017 at Unknown time  . Multiple Vitamin (MULTIVITAMIN) tablet Take 1 tablet by mouth every other day.    08/14/2017  . Pitavastatin Calcium 2 MG TABS Take 1 tablet (2 mg total) by mouth daily. 30 tablet 0 08/20/2017 at Unknown time  . ranitidine (ZANTAC) 150 MG  tablet TAKE 1 TABLET BY MOUTH TWO  TIMES DAILY 180 tablet 1 08/21/2017 at 0830  . traMADol (ULTRAM) 50 MG tablet 1-2 every 8-12 prn pain (Patient taking differently: Take 50 mg by mouth 2 (two) times daily as needed for moderate pain. ) 60 tablet 0 08/21/2017 at 0830  . Vitamin E (E-400 PO) Take 400 Units by mouth every other day.    08/14/2017  . Coenzyme Q10 (CO Q-10) 50 MG CAPS Take 50 mg by mouth every other day.   Unknown at Unknown time  . meclizine (ANTIVERT) 25 MG tablet Take 1 tablet (25 mg total) by mouth 3 (three) times daily as needed for dizziness. 21 tablet 0 Unknown at Unknown time   Allergies  Allergen Reactions  . Other Other (See Comments)    NO Blood or Blood products  . Sulfa Antibiotics Rash    Social History  Substance Use Topics  . Smoking status: Never Smoker  . Smokeless tobacco: Never Used  . Alcohol use No    Family History  Problem Relation Age of Onset  . Hypertension Mother   . Kidney disease Mother   . Diabetes Father   . Hypertension Father   . Diabetes Sister   . Hypertension Sister   . Diabetes Brother   . Hypertension Brother   . Heart disease Brother 34       heart attack  . Hypertension Sister      Review of Systems  Musculoskeletal: Positive for joint pain.  All other systems reviewed and are negative.   Objective:  Physical Exam  Constitutional: She is oriented to person, place, and time. She appears well-developed and well-nourished.  HENT:  Head: Normocephalic and atraumatic.  Eyes: Pupils are equal, round, and reactive to light. EOM are normal.  Neck: Normal range of motion. Neck supple.  Cardiovascular: Normal rate and regular rhythm.   Respiratory: Effort normal and breath sounds normal.  GI: Soft.  Musculoskeletal:       Right knee: She exhibits decreased range of motion, swelling and abnormal alignment. Tenderness found. Medial joint line and lateral joint line tenderness noted.  Neurological: She is alert and oriented to  person, place, and time.  Skin: Skin is warm and dry.  Psychiatric: She has a normal mood and affect.    Vital signs in last 24 hours: Temp:  [98 F (36.7 C)] 98 F (36.7 C) (09/04 1040) Pulse Rate:  [76-86] 80 (09/04 1145) Resp:  [14-19] 14 (09/04 1145) BP: (121-142)/(71-74) 142/71 (09/04 1145) SpO2:  [99 %-100 %] 100 % (09/04 1145)  Labs:   Estimated body mass index is 50.36 kg/m as calculated from the following:   Height as of 08/15/17: 5\' 6"  (1.676 m).   Weight as of 08/15/17: 312 lb (141.5 kg).   Imaging Review Plain radiographs demonstrate severe degenerative joint disease of the right knee(s). The overall alignment ismild valgus. The bone quality appears to be good for age and reported activity level.  Assessment/Plan:  End stage arthritis, right knee   The patient history,  physical examination, clinical judgment of the provider and imaging studies are consistent with end stage degenerative joint disease of the right knee(s) and total knee arthroplasty is deemed medically necessary. The treatment options including medical management, injection therapy arthroscopy and arthroplasty were discussed at length. The risks and benefits of total knee arthroplasty were presented and reviewed. The risks due to aseptic loosening, infection, stiffness, patella tracking problems, thromboembolic complications and other imponderables were discussed. The patient acknowledged the explanation, agreed to proceed with the plan and consent was signed. Patient is being admitted for inpatient treatment for surgery, pain control, PT, OT, prophylactic antibiotics, VTE prophylaxis, progressive ambulation and ADL's and discharge planning. The patient is planning to be discharged to skilled nursing facility

## 2017-08-22 ENCOUNTER — Encounter (HOSPITAL_COMMUNITY): Payer: Self-pay | Admitting: General Practice

## 2017-08-22 LAB — CBC
HCT: 31.9 % — ABNORMAL LOW (ref 36.0–46.0)
Hemoglobin: 9.5 g/dL — ABNORMAL LOW (ref 12.0–15.0)
MCH: 28.5 pg (ref 26.0–34.0)
MCHC: 29.8 g/dL — ABNORMAL LOW (ref 30.0–36.0)
MCV: 95.8 fL (ref 78.0–100.0)
Platelets: 286 10*3/uL (ref 150–400)
RBC: 3.33 MIL/uL — ABNORMAL LOW (ref 3.87–5.11)
RDW: 14.7 % (ref 11.5–15.5)
WBC: 10.5 10*3/uL (ref 4.0–10.5)

## 2017-08-22 LAB — BASIC METABOLIC PANEL
Anion gap: 8 (ref 5–15)
BUN: 20 mg/dL (ref 6–20)
CO2: 25 mmol/L (ref 22–32)
Calcium: 8.5 mg/dL — ABNORMAL LOW (ref 8.9–10.3)
Chloride: 104 mmol/L (ref 101–111)
Creatinine, Ser: 1.19 mg/dL — ABNORMAL HIGH (ref 0.44–1.00)
GFR calc Af Amer: 55 mL/min — ABNORMAL LOW (ref >60)
GFR calc non Af Amer: 48 mL/min — ABNORMAL LOW (ref >60)
Glucose, Bld: 174 mg/dL — ABNORMAL HIGH (ref 65–99)
Potassium: 4.2 mmol/L (ref 3.5–5.1)
Sodium: 137 mmol/L (ref 135–145)

## 2017-08-22 LAB — GLUCOSE, CAPILLARY
Glucose-Capillary: 207 mg/dL — ABNORMAL HIGH (ref 65–99)
Glucose-Capillary: 186 mg/dL — ABNORMAL HIGH (ref 65–99)
Glucose-Capillary: 250 mg/dL — ABNORMAL HIGH (ref 65–99)

## 2017-08-22 NOTE — Discharge Instructions (Addendum)
Information on my medicine - XARELTO® (Rivaroxaban) ° °This medication education was reviewed with me or my healthcare representative as part of my discharge preparation.   °Why was Xarelto® prescribed for you? °Xarelto® was prescribed for you to reduce the risk of blood clots forming after orthopedic surgery. The medical term for these abnormal blood clots is venous thromboembolism (VTE). ° °What do you need to know about xarelto® ? °Take your Xarelto® ONCE DAILY at the same time every day. °You may take it either with or without food. ° °If you have difficulty swallowing the tablet whole, you may crush it and mix in applesauce just prior to taking your dose. ° °Take Xarelto® exactly as prescribed by your doctor and DO NOT stop taking Xarelto® without talking to the doctor who prescribed the medication.  Stopping without other VTE prevention medication to take the place of Xarelto® may increase your risk of developing a clot. ° °After discharge, you should have regular check-up appointments with your healthcare provider that is prescribing your Xarelto®.   ° °What do you do if you miss a dose? °If you miss a dose, take it as soon as you remember on the same day then continue your regularly scheduled once daily regimen the next day. Do not take two doses of Xarelto® on the same day.  ° °Important Safety Information °A possible side effect of Xarelto® is bleeding. You should call your healthcare provider right away if you experience any of the following: °? Bleeding from an injury or your nose that does not stop. °? Unusual colored urine (red or dark brown) or unusual colored stools (red or black). °? Unusual bruising for unknown reasons. °? A serious fall or if you hit your head (even if there is no bleeding). ° °Some medicines may interact with Xarelto® and might increase your risk of bleeding while on Xarelto®. To help avoid this, consult your healthcare provider or pharmacist prior to using any new prescription  or non-prescription medications, including herbals, vitamins, non-steroidal anti-inflammatory drugs (NSAIDs) and supplements. ° °This website has more information on Xarelto®: www.xarelto.com. ° °INSTRUCTIONS AFTER JOINT REPLACEMENT  ° °o Remove items at home which could result in a fall. This includes throw rugs or furniture in walking pathways °o ICE to the affected joint every three hours while awake for 30 minutes at a time, for at least the first 3-5 days, and then as needed for pain and swelling.  Continue to use ice for pain and swelling. You may notice swelling that will progress down to the foot and ankle.  This is normal after surgery.  Elevate your leg when you are not up walking on it.   °o Continue to use the breathing machine you got in the hospital (incentive spirometer) which will help keep your temperature down.  It is common for your temperature to cycle up and down following surgery, especially at night when you are not up moving around and exerting yourself.  The breathing machine keeps your lungs expanded and your temperature down. ° ° °DIET:  As you were doing prior to hospitalization, we recommend a well-balanced diet. ° °DRESSING / WOUND CARE / SHOWERING ° °Keep the surgical dressing until follow up.  The dressing is water proof, so you can shower without any extra covering.  IF THE DRESSING FALLS OFF or the wound gets wet inside, change the dressing with sterile gauze.  Please use good hand washing techniques before changing the dressing.  Do not use any lotions   or creams on the incision until instructed by your surgeon.   ° °ACTIVITY ° °o Increase activity slowly as tolerated, but follow the weight bearing instructions below.   °o No driving for 6 weeks or until further direction given by your physician.  You cannot drive while taking narcotics.  °o No lifting or carrying greater than 10 lbs. until further directed by your surgeon. °o Avoid periods of inactivity such as sitting longer than  an hour when not asleep. This helps prevent blood clots.  °o You may return to work once you are authorized by your doctor.  ° ° ° °WEIGHT BEARING  ° °Weight bearing as tolerated with assist device (walker, cane, etc) as directed, use it as long as suggested by your surgeon or therapist, typically at least 4-6 weeks. ° ° °EXERCISES ° °Results after joint replacement surgery are often greatly improved when you follow the exercise, range of motion and muscle strengthening exercises prescribed by your doctor. Safety measures are also important to protect the joint from further injury. Any time any of these exercises cause you to have increased pain or swelling, decrease what you are doing until you are comfortable again and then slowly increase them. If you have problems or questions, call your caregiver or physical therapist for advice.  ° °Rehabilitation is important following a joint replacement. After just a few days of immobilization, the muscles of the leg can become weakened and shrink (atrophy).  These exercises are designed to build up the tone and strength of the thigh and leg muscles and to improve motion. Often times heat used for twenty to thirty minutes before working out will loosen up your tissues and help with improving the range of motion but do not use heat for the first two weeks following surgery (sometimes heat can increase post-operative swelling).  ° °These exercises can be done on a training (exercise) mat, on the floor, on a table or on a bed. Use whatever works the best and is most comfortable for you.    Use music or television while you are exercising so that the exercises are a pleasant break in your day. This will make your life better with the exercises acting as a break in your routine that you can look forward to.   Perform all exercises about fifteen times, three times per day or as directed.  You should exercise both the operative leg and the other leg as well. ° °Exercises  include: °  °• Quad Sets - Tighten up the muscle on the front of the thigh (Quad) and hold for 5-10 seconds.   °• Straight Leg Raises - With your knee straight (if you were given a brace, keep it on), lift the leg to 60 degrees, hold for 3 seconds, and slowly lower the leg.  Perform this exercise against resistance later as your leg gets stronger.  °• Leg Slides: Lying on your back, slowly slide your foot toward your buttocks, bending your knee up off the floor (only go as far as is comfortable). Then slowly slide your foot back down until your leg is flat on the floor again.  °• Angel Wings: Lying on your back spread your legs to the side as far apart as you can without causing discomfort.  °• Hamstring Strength:  Lying on your back, push your heel against the floor with your leg straight by tightening up the muscles of your buttocks.  Repeat, but this time bend your knee to a comfortable angle,   and push your heel against the floor.  You may put a pillow under the heel to make it more comfortable if necessary.  ° °A rehabilitation program following joint replacement surgery can speed recovery and prevent re-injury in the future due to weakened muscles. Contact your doctor or a physical therapist for more information on knee rehabilitation.  ° ° °CONSTIPATION ° °Constipation is defined medically as fewer than three stools per week and severe constipation as less than one stool per week.  Even if you have a regular bowel pattern at home, your normal regimen is likely to be disrupted due to multiple reasons following surgery.  Combination of anesthesia, postoperative narcotics, change in appetite and fluid intake all can affect your bowels.  ° °YOU MUST use at least one of the following options; they are listed in order of increasing strength to get the job done.  They are all available over the counter, and you may need to use some, POSSIBLY even all of these options:   ° °Drink plenty of fluids (prune juice may be  helpful) and high fiber foods °Colace 100 mg by mouth twice a day  °Senokot for constipation as directed and as needed Dulcolax (bisacodyl), take with full glass of water  °Miralax (polyethylene glycol) once or twice a day as needed. ° °If you have tried all these things and are unable to have a bowel movement in the first 3-4 days after surgery call either your surgeon or your primary doctor.   ° °If you experience loose stools or diarrhea, hold the medications until you stool forms back up.  If your symptoms do not get better within 1 week or if they get worse, check with your doctor.  If you experience "the worst abdominal pain ever" or develop nausea or vomiting, please contact the office immediately for further recommendations for treatment. ° ° °ITCHING:  If you experience itching with your medications, try taking only a single pain pill, or even half a pain pill at a time.  You can also use Benadryl over the counter for itching or also to help with sleep.  ° °TED HOSE STOCKINGS:  Use stockings on both legs until for at least 2 weeks or as directed by physician office. They may be removed at night for sleeping. ° °MEDICATIONS:  See your medication summary on the “After Visit Summary” that nursing will review with you.  You may have some home medications which will be placed on hold until you complete the course of blood thinner medication.  It is important for you to complete the blood thinner medication as prescribed. ° °PRECAUTIONS:  If you experience chest pain or shortness of breath - call 911 immediately for transfer to the hospital emergency department.  ° °If you develop a fever greater that 101 F, purulent drainage from wound, increased redness or drainage from wound, foul odor from the wound/dressing, or calf pain - CONTACT YOUR SURGEON.   °                                                °FOLLOW-UP APPOINTMENTS:  If you do not already have a post-op appointment, please call the office for an  appointment to be seen by your surgeon.  Guidelines for how soon to be seen are listed in your “After Visit Summary”, but are typically between 1-4   weeks after surgery. ° °OTHER INSTRUCTIONS:  ° °Knee Replacement:  Do not place pillow under knee, focus on keeping the knee straight while resting. CPM instructions: 0-90 degrees, 2 hours in the morning, 2 hours in the afternoon, and 2 hours in the evening. Place foam block, curve side up under heel at all times except when in CPM or when walking.  DO NOT modify, tear, cut, or change the foam block in any way. ° °MAKE SURE YOU:  °• Understand these instructions.  °• Get help right away if you are not doing well or get worse.  ° ° °Thank you for letting us be a part of your medical care team.  It is a privilege we respect greatly.  We hope these instructions will help you stay on track for a fast and full recovery!  ° °

## 2017-08-22 NOTE — Clinical Social Work Note (Signed)
Clinical Social Work Assessment  Patient Details  Name: [name] MRN: [id number] Date of Birth: [birth year]  Date of referral:  08/22/17               Reason for consult:  Facility Placement, Discharge Planning                Permission sought to share information with:  Case Manager, Customer service manager, Family Supports Permission granted to share information::     Name::        Agency::  [facility]  Relationship::  none reported at this time  Contact Information:     Housing/Transportation Living arrangements for the past 2 months:  Single Family Home Source of Information:  Patient, Medical Team Patient Interpreter Needed:  None Criminal Activity/Legal Involvement Pertinent to Current Situation/Hospitalization:  No - Comment as needed Significant Relationships:  Adult Children, Other Family Members Lives with:  Self Do you feel safe going back to the place where you live?  No Need for family participation in patient care:  No (Coment) (son is involved)  Care giving concerns:  Patient admitted for  Primary osteoarthritis and degenerative joint disease, right knee and completed surgery.  Reports she just had her left knee repaired 6 months ago and completed rehab at [facility].  Reports she would like to return to [facility] post discharge and has made this clear to MD. Reports she lives alone, but does have son involvement in care, but will require more care than he can provide at discharge.    Social Worker assessment / plan:  LCSW completed assessment and consult for new placement. SNF work up completed. LCSW (unit) will follow up with [facility] in effort to ensure she can return for SNF rehab as she reports. Patient prior to admission was home alone. Patient requesting EMS at discharge unless son is available to pick her up.  Will follow up with post discharge plans. Plan currently : SNF Employment status:  Retired Office manager PT Recommendations:  [facility] / Referral to community resources:  [place]  Patient/Family's Response to care:  Agreeable  Patient/Family's Understanding of and Emotional Response to Diagnosis, Current Treatment, and Prognosis:  Patient reports she just took some pain medication due to being in pain from surgery. Overall she reports feel good and is ready to begin therapy in effort to get back home. She is appreciative of care and optimistic about her recovery.  Emotional Assessment Appearance:  Appears stated age Attitude/Demeanor/Rapport:    Affect (typically observed):  Accepting, Adaptable, Pleasant Orientation:  Oriented to Self, Oriented to Place, Oriented to  Time, Oriented to Situation Alcohol / Substance use:  Not Applicable Psych involvement (Current and /or in the community):  No (Comment)  Discharge Needs  Concerns to be addressed:  No discharge needs identified Readmission within the last 30 days:  No Current discharge risk:  None Barriers to Discharge:  No Barriers Identified   Kaylee Cove, LCSW 08/22/2017, 6:26 PM

## 2017-08-22 NOTE — Clinical Social Work Placement (Signed)
   CLINICAL SOCIAL WORK PLACEMENT  NOTE  Date:  08/22/2017  Patient Details  Name: [name] MRN: [id number] Date of Birth: October 22, 1951  Clinical Social Work is seeking post-discharge placement for this patient at the [place] level of care (*CSW will initial, date and re-position this form in  chart as items are completed):  Yes   Patient/family provided with [facility]'s list of facilities offering this level of care within the geographic area requested by the patient (or if unable, by the patient's family).  Yes   Patient/family informed of their freedom to choose among providers that offer the needed level of care, that participate in Medicare, Medicaid or managed care program needed by the patient, have an available bed and are willing to accept the patient.  Yes   Patient/family informed of [facility]'s ownership interest in [facility] and [facility], as well as of the fact that they are under no obligation to receive care at these facilities.  PASRR submitted to EDS on       PASRR number received on       Existing PASRR number confirmed on 08/22/17     FL2 transmitted to all facilities in geographic area requested by pt/family on 08/22/17     FL2 transmitted to all facilities within larger geographic area on       Patient informed that his/her managed care company has contracts with or will negotiate with certain facilities, including the following:            Patient/family informed of bed offers received.  Patient chooses bed at       Physician recommends and patient chooses bed at      Patient to be transferred to   on  .  Patient to be transferred to facility by       Patient family notified on   of transfer.  Name of family member notified:        PHYSICIAN Please sign FL2     Additional Comment:    _______________________________________________ Lilly Cove, LCSW 08/22/2017, 6:24 PM

## 2017-08-22 NOTE — Progress Notes (Signed)
Orthopedic Tech Progress Note Patient Details:  [name] Apr 23, 1951 [id number]  Patient ID: [name], female   DOB: 1951/02/03, 66 y.o.   MRN: [id number] Pt will call when ready for cpm  Karolee Stamps 08/22/2017, 6:06 AM

## 2017-08-22 NOTE — Evaluation (Signed)
Physical Therapy Evaluation Patient Details Name: [name] MRN: [id number] DOB: Nov 18, 1951 Today's Date: 08/22/2017   History of Present Illness  Pt is a 66 y.o. female s/p R TKA. PMH consists of L TKA (Feb 2018), HTN, DM, and obesity.  Clinical Impression  Pt is s/p TKA resulting in the deficits listed below (see PT Problem List). On eval, pt required mod assist bed mobility, mod assist transfers and min assist gait with RW 5 feet. Gait distance limited by pain.  Pt will benefit from skilled PT to increase their independence and safety with mobility to allow discharge to the venue listed below. Pt has arranged for rehab at [facility]. She lives alone and will have no assist at home.     Follow Up Recommendations SNF;Supervision/Assistance - 24 hour    Equipment Recommendations  None recommended by PT    Recommendations for Other Services       Precautions / Restrictions Precautions Precautions: Knee Precaution Comments: Reviewed no pillow under knee. Restrictions Weight Bearing Restrictions: Yes RLE Weight Bearing: Weight bearing as tolerated      Mobility  Bed Mobility Overal bed mobility: Needs Assistance Bed Mobility: Supine to Sit     Supine to sit: Mod assist     General bed mobility comments: Verbal cues for sequencing. Assist with RLE and to elevate trunk. Increased time to complete.  Transfers Overall transfer level: Needs assistance Equipment used: Rolling walker (2 wheeled) Transfers: Sit to/from Stand Sit to Stand: Mod assist         General transfer comment: verbal cues for hand placement. Assist to power up. Increased time to stabilize initial standing balance.  Ambulation/Gait Ambulation/Gait assistance: Min assist Ambulation Distance (Feet): 5 Feet Assistive device: Rolling walker (2 wheeled) Gait Pattern/deviations: Step-to pattern;Decreased stride length;Antalgic;Decreased weight shift to right;Decreased stance time - right Gait  velocity: decreased Gait velocity interpretation: Below normal speed for age/gender General Gait Details: distance limited by pain  Stairs            Wheelchair Mobility    Modified Rankin (Stroke Patients Only)       Balance                                             Pertinent Vitals/Pain Pain Assessment: 0-10 Pain Score: 9  Pain Location: R knee Pain Descriptors / Indicators: Operative site guarding;Grimacing;Guarding;Sharp Pain Intervention(s): Limited activity within patient's tolerance;Repositioned;Patient requesting pain meds-RN notified;Monitored during session    Home Living Family/patient expects to be discharged to:: Other (Comment) (Rehab at [facility]) Living Arrangements: Alone               Additional Comments: Pt resides in a senior citizen's apt complex.  She has all needed DME from prior surgery.    Prior Function Level of Independence: Independent               Hand Dominance   Dominant Hand: Right    Extremity/Trunk Assessment   Upper Extremity Assessment Upper Extremity Assessment: Overall WFL for tasks assessed         Cervical / Trunk Assessment Cervical / Trunk Assessment: Kyphotic  Communication   Communication: No difficulties  Cognition Arousal/Alertness: Awake/alert Behavior During Therapy: WFL for tasks assessed/performed Overall Cognitive Status: Within Functional Limits for tasks assessed  General Comments      Exercises Total Joint Exercises Ankle Circles/Pumps: AROM;Both;10 reps Quad Sets: AROM;Right;5 reps   Assessment/Plan    PT Assessment Patient needs continued PT services  PT Problem List Decreased activity tolerance;Decreased range of motion;Decreased strength;Decreased mobility;Decreased balance;Pain       PT Treatment Interventions DME instruction;Gait training;Functional mobility training;Balance  training;Therapeutic exercise;Therapeutic activities;Patient/family education    PT Goals (Current goals can be found in the Care Plan section)  Acute Rehab PT Goals Patient Stated Goal: rehab then home PT Goal Formulation: With patient Time For Goal Achievement: 08/29/17 Potential to Achieve Goals: Good    Frequency 7X/week   Barriers to discharge        Co-evaluation               AM-PAC PT "6 Clicks" Daily Activity  Outcome Measure Difficulty turning over in bed (including adjusting bedclothes, sheets and blankets)?: Unable Difficulty moving from lying on back to sitting on the side of the bed? : Unable Difficulty sitting down on and standing up from a chair with arms (e.g., wheelchair, bedside commode, etc,.)?: Unable Help needed moving to and from a bed to chair (including a wheelchair)?: A Lot Help needed walking in hospital room?: A Lot Help needed climbing 3-5 steps with a railing? : Total 6 Click Score: 8    End of Session Equipment Utilized During Treatment: Gait belt;Oxygen Activity Tolerance: Patient limited by pain Patient left: in chair;with call bell/phone within reach Nurse Communication: Mobility status;Patient requests pain meds PT Visit Diagnosis: Pain;Other abnormalities of gait and mobility (R26.89);Difficulty in walking, not elsewhere classified (R26.2) Pain - Right/Left: Right Pain - part of body: Knee    Time: 0930-1001 PT Time Calculation (min) (ACUTE ONLY): 31 min   Charges:   PT Evaluation $PT Eval Moderate Complexity: 1 Mod PT Treatments $Therapeutic Activity: 8-22 mins   PT G Codes:        Lorrin Goodell, PT  Office # [phone] Pager [phone]   [name] 08/22/2017, 10:17 AM

## 2017-08-22 NOTE — Evaluation (Signed)
Occupational Therapy Evaluation Patient Details Name: [name] MRN: [id number] DOB: 08/29/1951 Today's Date: 08/22/2017    History of Present Illness Pt is a 66 y.o. female s/p R TKA. PMH consists of L TKA (Feb 2018), HTN, DM, and obesity.   Clinical Impression   Pt reports she was independent with ADL PTA. Currently pt requires min guard assist for functional mobility, min assist for UB ADL, and max assist for LB ADL. Recommending SNF for continued rehab to maximize independence and safety with ADL and functional mobility prior to return home alone. Pt would benefit from continued skilled OT to address established goals.    Follow Up Recommendations  SNF    Equipment Recommendations  None recommended by OT    Recommendations for Other Services       Precautions / Restrictions Precautions Precautions: Knee Restrictions Weight Bearing Restrictions: Yes RLE Weight Bearing: Weight bearing as tolerated      Mobility Bed Mobility               General bed mobility comments: Pt OOB in chair upon arrival  Transfers Overall transfer level: Needs assistance Equipment used: Rolling walker (2 wheeled) Transfers: Sit to/from Stand Sit to Stand: Min assist         General transfer comment: min assist to boost up from chair x1, BSC x1. Good hand placement and technique. Increased time and effort    Balance Overall balance assessment: Needs assistance Sitting-balance support: Feet supported;No upper extremity supported Sitting balance-Leahy Scale: Good     Standing balance support: Bilateral upper extremity supported Standing balance-Leahy Scale: Poor Standing balance comment: RW for support                           ADL either performed or assessed with clinical judgement   ADL Overall ADL's : Needs assistance/impaired Eating/Feeding: Set up;Sitting   Grooming: Set up;Supervision/safety;Sitting   Upper Body Bathing: Minimal assistance;Sitting    Lower Body Bathing: Maximal assistance;Sit to/from stand   Upper Body Dressing : Minimal assistance;Sitting   Lower Body Dressing: Maximal assistance;Sit to/from stand   Toilet Transfer: Min guard;Ambulation;BSC;RW   Toileting- Clothing Manipulation and Hygiene: Total assistance;Sit to/from stand       Functional mobility during ADLs: Surveyor, minerals     Praxis      Pertinent Vitals/Pain Pain Assessment: 0-10 Pain Score: 6  Pain Location: R knee Pain Descriptors / Indicators: Burning;Aching Pain Intervention(s): Monitored during session;Patient requesting pain meds-RN notified;Ice applied     Hand Dominance Right   Extremity/Trunk Assessment Upper Extremity Assessment Upper Extremity Assessment: Overall WFL for tasks assessed   Lower Extremity Assessment Lower Extremity Assessment: Defer to PT evaluation       Communication Communication Communication: No difficulties   Cognition Arousal/Alertness: Awake/alert Behavior During Therapy: WFL for tasks assessed/performed Overall Cognitive Status: Within Functional Limits for tasks assessed                                     General Comments       Exercises    Shoulder Instructions      Home Living Family/patient expects to be discharged to:: Skilled nursing facility Living Arrangements: Alone  Additional Comments: Plan for re[facility] at [facility]      Prior Functioning/Environment Level of Independence: Independent                 OT Problem List: Decreased strength;Decreased activity tolerance;Impaired balance (sitting and/or standing);Decreased knowledge of use of DME or AE;Decreased knowledge of precautions;Pain;Obesity;Increased edema      OT Treatment/Interventions: Self-care/ADL training;Energy conservation;DME and/or AE instruction;Therapeutic activities;Patient/family education;Balance  training    OT Goals(Current goals can be found in the care plan section) Acute Rehab OT Goals Patient Stated Goal: rehab then home OT Goal Formulation: With patient Time For Goal Achievement: 09/05/17 Potential to Achieve Goals: Good ADL Goals Pt Will Perform Lower Body Bathing: with supervision;sit to/from stand Pt Will Perform Lower Body Dressing: with supervision;sit to/from stand Pt Will Transfer to Toilet: with supervision;ambulating;bedside commode Pt Will Perform Toileting - Clothing Manipulation and hygiene: with supervision;sit to/from stand  OT Frequency: Min 2X/week   Barriers to D/C: Decreased caregiver support  pt lives alone       Co-evaluation              AM-PAC PT "6 Clicks" Daily Activity     Outcome Measure Help from another person eating meals?: None Help from another person taking care of personal grooming?: A Little Help from another person toileting, which includes using toliet, bedpan, or urinal?: A Lot Help from another person bathing (including washing, rinsing, drying)?: A Lot Help from another person to put on and taking off regular upper body clothing?: A Little Help from another person to put on and taking off regular lower body clothing?: A Lot 6 Click Score: 16   End of Session Equipment Utilized During Treatment: Rolling walker CPM Right Knee CPM Right Knee: Off Nurse Communication: Mobility status;Patient requests pain meds  Activity Tolerance: Patient tolerated treatment well Patient left: in chair;with call bell/phone within reach  OT Visit Diagnosis: Unsteadiness on feet (R26.81);Other abnormalities of gait and mobility (R26.89);Muscle weakness (generalized) (M62.81);Pain Pain - Right/Left: Right Pain - part of body: Knee                Time: 1514-1530 OT Time Calculation (min): 16 min Charges:  OT General Charges $OT Visit: 1 Visit OT Evaluation $OT Eval Moderate Complexity: 1 Mod G-Codes:     Jamonica Schoff A. Ulice Brilliant, M.S.,  OTR/L Pager: [place] 08/22/2017, 3:50 PM

## 2017-08-22 NOTE — Progress Notes (Signed)
Subjective: 1 Day Post-Op Procedure(s) (LRB): RIGHT TOTAL KNEE ARTHROPLASTY (Right) Patient reports pain as moderate.  Asymptomatic acute blood loss anemia.  Objective: Vital signs in last 24 hours: Temp:  [97 F (36.1 C)-98.4 F (36.9 C)] 98.4 F (36.9 C) (09/05 0446) Pulse Rate:  [76-99] 81 (09/05 0446) Resp:  [12-30] 17 (09/05 0446) BP: (121-163)/(63-119) 127/63 (09/05 0446) SpO2:  [89 %-100 %] 96 % (09/05 0446)  Intake/Output from previous day: 09/04 0701 - 09/05 0700 In: 1300 [I.V.:1300] Out: 550 [Urine:500; Blood:50] Intake/Output this shift: No intake/output data recorded.   Recent Labs  08/22/17 0321  HGB 9.5*    Recent Labs  08/22/17 0321  WBC 10.5  RBC 3.33*  HCT 31.9*  PLT 286    Recent Labs  08/22/17 0321  NA 137  K 4.2  CL 104  CO2 25  BUN 20  CREATININE 1.19*  GLUCOSE 174*  CALCIUM 8.5*   No results for input(s): LABPT, INR in the last 72 hours.  Sensation intact distally Intact pulses distally Dorsiflexion/Plantar flexion intact Incision: dressing C/D/I Compartment soft  Assessment/Plan: 1 Day Post-Op Procedure(s) (LRB): RIGHT TOTAL KNEE ARTHROPLASTY (Right) Up with therapy  Mcarthur Rossetti 08/22/2017, 7:59 AM

## 2017-08-22 NOTE — Op Note (Signed)
NAME:  Barnett, Kaylee                     ACCOUNT NO.:  MEDICAL RECORD NO.:  [id number]  LOCATION:                                 FACILITY:  PHYSICIAN:  Lind Guest. Ninfa Linden, M.D.DATE OF BIRTH:  1951/08/15  DATE OF PROCEDURE:  08/21/2017 DATE OF DISCHARGE:                              OPERATIVE REPORT   PREOPERATIVE DIAGNOSES:  Primary osteoarthritis and degenerative joint disease, right knee.  POSTOPERATIVE DIAGNOSES:  Primary osteoarthritis and degenerative joint disease, right knee.  PROCEDURE:  Right total knee arthroplasty.  IMPLANTS:  Stryker Triathlon knee with size 3 femur, size 3 universal tibial base plate, size 11 mm fix-bearing polyethylene insert, size 29 patellar button.  SURGEON:  Lind Guest. Ninfa Linden, M.D.  ASSISTANT:  Erskine Emery, PA-C.  ANESTHESIA: 1. Right lower extremity adductor canal block. 2. General.  TOURNIQUET TIME:  Under 1 hour.  ESTIMATED BLOOD LOSS:  Less than 100 mL.  COMPLICATIONS:  None.  INDICATIONS:  [name] is a 66 year old female, well known to me.  She is morbidly obese individual, who has severe osteoarthritis of both her knees.  Six months ago, she underwent a successful left total knee arthroplasty.  Now wishes to have this done on the right side.  Her pain is daily and it has detrimentally affected her activities of daily living, her quality of life, and her mobility.  Her x-ray showed complete loss of the joint space.  At this point, given failure of conservative treatment, which she has tried, we have recommended total knee replacement on the right side given her success on the left side. A thorough discussion of risk of nerve and vessel injury, fracture, infection, DVT were discussed as well as the goals of decreased pain, improved mobility, and overall improved quality of life.  DESCRIPTION OF PROCEDURE:  After informed consent was obtained, appropriate right knee was marked.  Anesthesia obtained an  adductor canal block in the holding room.  She was then brought to the operating room and placed supine on the operating table.  General anesthesia was then obtained.  Of note, she did get 3 g of IV Ancef prior to making incision.  A nonsterile tourniquet was placed around her upper right thigh and her right leg was prepped and draped from the thigh down to the ankle with DuraPrep and sterile drapes including a sterile stockinette.  Time-out was called.  She was identified as correct patient and correct right knee.  I then made incision over the patella and carried this proximally and distally.  We dissected down the knee joint, carried out a medial parapatellar arthrotomy, finding a very large joint effusion and significant arthritis throughout the knee. With the knee in a flexed position, we removed remnants of ACL, PCL, medial and lateral meniscus.  We then set our extramedullary cutting guide for the tibia taking 9 mm off the high side correcting for varus, valgus, and neutral slope.  We made this cut without difficulty.  We then went to the femur and set our distal femoral cutting guide off the intramedullary guide, based on setting a right cutting block for 5 degrees, externally rotated, an 8 mm distal femoral  cut.  We made this cut without difficulty and brought the knee back down to full extension. With a 9 mm extension block, it achieved full extension.  We then removed remnants from the back of the knee, which was more of the remnants of the medial and lateral meniscus.  We put the knee back in a flexed position, and used our femoral sizing guide based off the epicondylar axis and chose a size 3 femur.  We put our 4-in-1 cutting block for a size 3 femur.  We made our anterior and posterior cuts, followed by our chamfer cuts.  We then made our femoral box cut.  We then went back to the tibia again and chose a size 3 tibial tray for coverage of the tibial plateau.  We set this for  appropriate rotation and did our drill cut for the universal base plate, followed by our keel cut.  We then kept the size 3 tibial tray in and placed a size 3 trial femur and trialed 11 mm polyethylene insert given her other side and her obesity and we were pleased with range of motion and stability of this. We then made our patellar cut and drilled 3 holes for patellar button size 29.  We removed all trial instrumentation and then irrigated the knee with normal saline solution.  We were able to then cement the real Stryker Triathlon tibial universal base plate size 3, followed by cementing the size 3 femur.  We placed the real 11 mm fix-bearing polyethylene insert, and removed cement debris from the knee and then cemented the patellar button.  Once the cement had hardened, we let the tourniquet down.  Hemostasis was obtained with electrocautery.  We then irrigated the knee with normal saline solution and closed the arthrotomy with interrupted #1 Vicryl suture, followed by 0 Vicryl in the deep tissue, 2-0 Vicryl in the subcutaneous tissue, and interrupted staples on the skin.  Xeroform and well-padded sterile dressing was applied. She was awakened, extubated, and taken to the recovery room in stable condition.  All final counts were correct.  There were no complications noted.  Of note, Erskine Emery, PA-C, assisted in the entire case.  His assistance was crucial for facilitating all aspects of this case.     Lind Guest. Ninfa Linden, M.D.     CYB/MEDQ  D:  08/21/2017  T:  08/21/2017  Job:  [id number]

## 2017-08-22 NOTE — Progress Notes (Signed)
Physical Therapy Treatment Patient Details Name: [name] MRN: [id number] DOB: 25-Feb-1951 Today's Date: 08/22/2017    History of Present Illness Pt is a 66 y.o. female s/p R TKA. PMH consists of L TKA (Feb 2018), HTN, DM, and obesity.    PT Comments    Pt progressing well. Increased gait distance noted this session.Continue per POC.   Follow Up Recommendations  SNF;Supervision/Assistance - 24 hour     Equipment Recommendations  None recommended by PT    Recommendations for Other Services       Precautions / Restrictions Precautions Precautions: Knee Precaution Comments: Reviewed no pillow under knee. Restrictions Weight Bearing Restrictions: Yes RLE Weight Bearing: Weight bearing as tolerated    Mobility  Bed Mobility Overal bed mobility: Needs Assistance Bed Mobility: Supine to Sit     Supine to sit: Mod assist     General bed mobility comments: Verbal cues for sequencing. Assist with RLE and to elevate trunk. Increased time to complete.  Transfers Overall transfer level: Needs assistance Equipment used: Rolling walker (2 wheeled) Transfers: Sit to/from Stand Sit to Stand: Min assist         General transfer comment: verbal cues for hand placement. Assist to power up. Increased time to stabilize initial standing balance.  Ambulation/Gait Ambulation/Gait assistance: Min assist Ambulation Distance (Feet): 20 Feet Assistive device: Rolling walker (2 wheeled) Gait Pattern/deviations: Step-to pattern;Decreased stride length;Antalgic;Decreased weight shift to right;Decreased stance time - right Gait velocity: decreased Gait velocity interpretation: Below normal speed for age/gender General Gait Details: verbal cues for sequencing   Stairs            Wheelchair Mobility    Modified Rankin (Stroke Patients Only)       Balance                                            Cognition Arousal/Alertness: Awake/alert Behavior  During Therapy: WFL for tasks assessed/performed Overall Cognitive Status: Within Functional Limits for tasks assessed                                        Exercises Total Joint Exercises Ankle Circles/Pumps: AROM;10 reps;Both Quad Sets: AROM;Right;10 reps Short Arc Quad: AROM;Right;10 reps Heel Slides: AAROM;Right;10 reps Straight Leg Raises: AAROM;Right;5 reps Goniometric ROM: 0-55 R knee    General Comments        Pertinent Vitals/Pain Pain Assessment: 0-10 Pain Score: 7  Pain Location: R knee Pain Descriptors / Indicators: Operative site guarding;Grimacing;Guarding;Sharp Pain Intervention(s): Monitored during session;Ice applied    Home Living Family/patient expects to be discharged to:: Other (Comment) (Rehab at [facility]) Living Arrangements: Alone             Additional Comments: Pt resides in a senior citizen's apt complex.  She has all needed DME from prior surgery.    Prior Function Level of Independence: Independent          PT Goals (current goals can now be found in the care plan section) Acute Rehab PT Goals Patient Stated Goal: rehab then home PT Goal Formulation: With patient Time For Goal Achievement: 08/29/17 Potential to Achieve Goals: Good Progress towards PT goals: Progressing toward goals    Frequency    7X/week      PT Plan Current  plan remains appropriate    Co-evaluation              AM-PAC PT "6 Clicks" Daily Activity  Outcome Measure  Difficulty turning over in bed (including adjusting bedclothes, sheets and blankets)?: Unable Difficulty moving from lying on back to sitting on the side of the bed? : Unable Difficulty sitting down on and standing up from a chair with arms (e.g., wheelchair, bedside commode, etc,.)?: Unable Help needed moving to and from a bed to chair (including a wheelchair)?: A Little Help needed walking in hospital room?: A Little Help needed climbing 3-5 steps with a railing? :  A Lot 6 Click Score: 11    End of Session Equipment Utilized During Treatment: Gait belt Activity Tolerance: Patient tolerated treatment well Patient left: in chair;with call bell/phone within reach Nurse Communication: Mobility status PT Visit Diagnosis: Pain;Other abnormalities of gait and mobility (R26.89);Difficulty in walking, not elsewhere classified (R26.2) Pain - Right/Left: Right Pain - part of body: Knee     Time: 1219-1250 PT Time Calculation (min) (ACUTE ONLY): 31 min  Charges:  $Gait Training: 8-22 mins $Therapeutic Exercise: 8-22 mins $Therapeutic Activity: 8-22 mins                    G Codes:       [name], PT  Office # [phone] Pager [phone]    [name] 08/22/2017, 1:00 PM

## 2017-08-23 LAB — GLUCOSE, CAPILLARY
Glucose-Capillary: 231 mg/dL — ABNORMAL HIGH (ref 65–99)
Glucose-Capillary: 194 mg/dL — ABNORMAL HIGH (ref 65–99)
Glucose-Capillary: 191 mg/dL — ABNORMAL HIGH (ref 65–99)
Glucose-Capillary: 136 mg/dL — ABNORMAL HIGH (ref 65–99)
Glucose-Capillary: 144 mg/dL — ABNORMAL HIGH (ref 65–99)

## 2017-08-23 MED ORDER — RIVAROXABAN 10 MG PO TABS: 10.0000 mg | ORAL_TABLET | Freq: Every day | ORAL | 0 refills | Status: DC

## 2017-08-23 MED ORDER — OXYCODONE-ACETAMINOPHEN 5-325 MG PO TABS: 1.0000 | ORAL_TABLET | ORAL | 0 refills | Status: DC | PRN

## 2017-08-23 MED ORDER — METHOCARBAMOL 500 MG PO TABS: 500.0000 mg | ORAL_TABLET | Freq: Four times a day (QID) | ORAL | 0 refills | Status: DC | PRN

## 2017-08-23 NOTE — Progress Notes (Addendum)
Physical Therapy Treatment Patient Details Name: [name] MRN: [id number] DOB: 11/18/1951 Today's Date: 08/23/2017    History of Present Illness Pt is a 66 y.o. female s/p R TKA. PMH consists of L TKA (Feb 2018), HTN, DM, and obesity.    PT Comments    Pt performed limited gait and activity secondary to urine incontinence and need for toilet transfer.  Plan next session to review HEP and advance gait distance.  Pt tolerated tx well.     Follow Up Recommendations  SNF;Supervision/Assistance - 24 hour     Equipment Recommendations  None recommended by PT    Recommendations for Other Services       Precautions / Restrictions Precautions Precautions: Knee Precaution Comments: Reviewed no pillow under knee. Restrictions Weight Bearing Restrictions: Yes RLE Weight Bearing: Weight bearing as tolerated    Mobility  Bed Mobility Overal bed mobility: Needs Assistance Bed Mobility: Sit to Supine       Sit to supine: Min assist   General bed mobility comments: Pt OOB in chair upon arrival, required min assist to advance LEs into bed for placement in the CPM.    Transfers Overall transfer level: Needs assistance Equipment used: Rolling walker (2 wheeled) Transfers: Sit to/from Stand Sit to Stand: Supervision         General transfer comment: min assist from chair and commode seat.  LOB during anterior translation.    Ambulation/Gait Ambulation/Gait assistance: Min assist Ambulation Distance (Feet): 35 Feet (required termination after urinary incontinence.  ) Assistive device: Rolling walker (2 wheeled) Gait Pattern/deviations: Step-to pattern;Decreased stride length;Antalgic;Decreased weight shift to right;Decreased stance time - right     General Gait Details: verbal cues for sequencing   Stairs            Wheelchair Mobility    Modified Rankin (Stroke Patients Only)       Balance Overall balance assessment: Needs assistance   Sitting  balance-Leahy Scale: Good       Standing balance-Leahy Scale: Poor                              Cognition Arousal/Alertness: Awake/alert Behavior During Therapy: WFL for tasks assessed/performed Overall Cognitive Status: Within Functional Limits for tasks assessed                                        Exercises Total Joint Exercises Goniometric ROM: grossly 0-70 degrees in R knee.      General Comments        Pertinent Vitals/Pain Pain Assessment: 0-10 Pain Score: 6  Pain Location: R knee Pain Descriptors / Indicators: Burning;Aching Pain Intervention(s): Monitored during session;Repositioned    Home Living                      Prior Function            PT Goals (current goals can now be found in the care plan section) Acute Rehab PT Goals Patient Stated Goal: rehab then home Potential to Achieve Goals: Good Progress towards PT goals: Progressing toward goals    Frequency    7X/week      PT Plan Current plan remains appropriate    Co-evaluation              AM-PAC PT "6 Clicks" Daily Activity  Outcome  Measure  Difficulty turning over in bed (including adjusting bedclothes, sheets and blankets)?: Unable Difficulty moving from lying on back to sitting on the side of the bed? : Unable Difficulty sitting down on and standing up from a chair with arms (e.g., wheelchair, bedside commode, etc,.)?: Unable Help needed moving to and from a bed to chair (including a wheelchair)?: A Little Help needed walking in hospital room?: A Little Help needed climbing 3-5 steps with a railing? : A Lot 6 Click Score: 11    End of Session Equipment Utilized During Treatment: Gait belt Activity Tolerance: Patient tolerated treatment well Patient left: in chair;with call bell/phone within reach Nurse Communication: Mobility status PT Visit Diagnosis: Pain;Other abnormalities of gait and mobility (R26.89);Difficulty in walking, not  elsewhere classified (R26.2) Pain - Right/Left: Right Pain - part of body: Knee     Time: 1341-1416 PT Time Calculation (min) (ACUTE ONLY): 35 min  Charges:  $Gait Training: 8-22 mins $Therapeutic Activity: 8-22 mins                    G Codes:       Governor Rooks, PTA pager [place] 08/23/2017, 5:03 PM

## 2017-08-23 NOTE — Social Work (Signed)
CSW f/u with patient and confirmed Soil scientist.  CSW discussed with admission and they will have semi private room. Pt agred to semi private room.  CSW will continue to follow to disposition.  Elissa Hefty, LCSW Clinical Social Worker [phone]

## 2017-08-23 NOTE — Progress Notes (Signed)
Subjective: 2 Days Post-Op Procedure(s) (LRB): RIGHT TOTAL KNEE ARTHROPLASTY (Right) Patient reports pain as moderate.    Objective: Vital signs in last 24 hours: Temp:  [99.7 F (37.6 C)-100 F (37.8 C)] 99.8 F (37.7 C) (09/06 0358) Pulse Rate:  [100-123] 100 (09/06 0358) Resp:  [20] 20 (09/06 0358) BP: (128-150)/(65-73) 136/73 (09/06 0358) SpO2:  [86 %-96 %] 94 % (09/06 0358)  Intake/Output from previous day: 09/05 0701 - 09/06 0700 In: 1851.3 [P.O.:360; I.V.:1491.3] Out: 300 [Urine:300] Intake/Output this shift: No intake/output data recorded.   Recent Labs  08/22/17 0321  HGB 9.5*    Recent Labs  08/22/17 0321  WBC 10.5  RBC 3.33*  HCT 31.9*  PLT 286    Recent Labs  08/22/17 0321  NA 137  K 4.2  CL 104  CO2 25  BUN 20  CREATININE 1.19*  GLUCOSE 174*  CALCIUM 8.5*   No results for input(s): LABPT, INR in the last 72 hours.  Sensation intact distally Intact pulses distally Dorsiflexion/Plantar flexion intact Incision: scant drainage No cellulitis present Compartment soft  Assessment/Plan: 2 Days Post-Op Procedure(s) (LRB): RIGHT TOTAL KNEE ARTHROPLASTY (Right) Up with therapy Plan for discharge tomorrow Discharge to SNF  [name] 08/23/2017, 7:32 AM

## 2017-08-24 DIAGNOSIS — K219 Gastro-esophageal reflux disease without esophagitis: Secondary | ICD-10-CM | POA: Diagnosis not present

## 2017-08-24 DIAGNOSIS — M1990 Unspecified osteoarthritis, unspecified site: Secondary | ICD-10-CM | POA: Diagnosis not present

## 2017-08-24 DIAGNOSIS — M6281 Muscle weakness (generalized): Secondary | ICD-10-CM | POA: Diagnosis not present

## 2017-08-24 DIAGNOSIS — Z96651 Presence of right artificial knee joint: Secondary | ICD-10-CM | POA: Diagnosis not present

## 2017-08-24 DIAGNOSIS — I10 Essential (primary) hypertension: Secondary | ICD-10-CM | POA: Diagnosis not present

## 2017-08-24 DIAGNOSIS — Z471 Aftercare following joint replacement surgery: Secondary | ICD-10-CM | POA: Diagnosis not present

## 2017-08-24 DIAGNOSIS — D62 Acute posthemorrhagic anemia: Secondary | ICD-10-CM | POA: Diagnosis not present

## 2017-08-24 DIAGNOSIS — R278 Other lack of coordination: Secondary | ICD-10-CM | POA: Diagnosis not present

## 2017-08-24 DIAGNOSIS — R600 Localized edema: Secondary | ICD-10-CM | POA: Diagnosis not present

## 2017-08-24 DIAGNOSIS — M1711 Unilateral primary osteoarthritis, right knee: Secondary | ICD-10-CM | POA: Diagnosis not present

## 2017-08-24 DIAGNOSIS — E119 Type 2 diabetes mellitus without complications: Secondary | ICD-10-CM | POA: Diagnosis not present

## 2017-08-24 DIAGNOSIS — S8002XA Contusion of left knee, initial encounter: Secondary | ICD-10-CM | POA: Diagnosis not present

## 2017-08-24 DIAGNOSIS — E1169 Type 2 diabetes mellitus with other specified complication: Secondary | ICD-10-CM | POA: Diagnosis not present

## 2017-08-24 LAB — GLUCOSE, CAPILLARY
Glucose-Capillary: 163 mg/dL — ABNORMAL HIGH (ref 65–99)
Glucose-Capillary: 178 mg/dL — ABNORMAL HIGH (ref 65–99)
Glucose-Capillary: 165 mg/dL — ABNORMAL HIGH (ref 65–99)

## 2017-08-24 NOTE — Discharge Summary (Signed)
Patient ID: [name] MRN: [id number] DOB/AGE: 64-09-1951 64 y.o.  Admit date: 08/21/2017 Discharge date: 08/24/2017  Admission Diagnoses:  Principal Problem:   Unilateral primary osteoarthritis, right knee Active Problems:   Status post total right knee replacement   Discharge Diagnoses:  Same  Past Medical History:  Diagnosis Date  . Abdominal hernia   . Arthritis    knees, back, R ankle   . Diabetes mellitus   . Edema    legs  . GERD (gastroesophageal reflux disease)   . HOH (hard of hearing)   . Hypertension   . Obesity    morbid obesity  . Vertigo     Surgeries: Procedure(s): RIGHT TOTAL KNEE ARTHROPLASTY on 08/21/2017   Consultants:   Discharged Condition: Improved  Hospital Course: [name] is an 66 y.o. female who was admitted 08/21/2017 for operative treatment ofUnilateral primary osteoarthritis, right knee. Patient has severe unremitting pain that affects sleep, daily activities, and work/hobbies. After pre-op clearance the patient was taken to the operating room on 08/21/2017 and underwent  Procedure(s): RIGHT TOTAL KNEE ARTHROPLASTY.    Patient was given perioperative antibiotics: Anti-infectives    Start     Dose/Rate Route Frequency Ordered Stop   08/21/17 1900  ceFAZolin (ANCEF) IVPB 2g/100 mL premix     2 g 200 mL/hr over 30 Minutes Intravenous Every 6 hours 08/21/17 1649 08/22/17 0410   08/21/17 0600  ceFAZolin (ANCEF) 3 g in dextrose 5 % 50 mL IVPB     3 g 130 mL/hr over 30 Minutes Intravenous On call to O.R. 08/19/17 1454 08/21/17 1309       Patient was given sequential compression devices, early ambulation, and chemoprophylaxis to prevent DVT.  Patient benefited maximally from hospital stay and there were no complications.    Recent vital signs: Patient Vitals for the past 24 hrs:  BP Temp Temp src Pulse Resp SpO2  08/24/17 0448 (!) 100/54 99.4 F (37.4 C) Oral 100 20 93 %  08/23/17 2157 110/68 98.7 F (37.1 C) Oral 74 18 96 %   08/23/17 1427 112/61 98.4 F (36.9 C) Oral (!) 112 18 94 %  08/23/17 0912 (!) 118/58 98.5 F (36.9 C) Oral (!) 114 20 96 %     Recent laboratory studies:  Recent Labs  08/22/17 0321  WBC 10.5  HGB 9.5*  HCT 31.9*  PLT 286  NA 137  K 4.2  CL 104  CO2 25  BUN 20  CREATININE 1.19*  GLUCOSE 174*  CALCIUM 8.5*     Discharge Medications:   Allergies as of 08/24/2017      Reactions   Other Other (See Comments)   NO Blood or Blood products   Sulfa Antibiotics Rash      Medication List    STOP taking these medications   traMADol 50 MG tablet Commonly known as:  ULTRAM     TAKE these medications   aspirin EC 81 MG tablet Take 81 mg by mouth daily.   cholecalciferol 1000 units tablet Commonly known as:  VITAMIN D Take 1,000 Units by mouth every other day.   Co Q-10 50 MG Caps Take 50 mg by mouth every other day.   E-400 PO Take 400 Units by mouth every other day.   glimepiride 4 MG tablet Commonly known as:  AMARYL TAKE 1 TABLET BY MOUTH  EVERY DAY WITH BREAKFAST   losartan-hydrochlorothiazide 100-12.5 MG tablet Commonly known as:  HYZAAR TAKE 1 TABLET BY MOUTH  DAILY  meclizine 25 MG tablet Commonly known as:  ANTIVERT Take 1 tablet (25 mg total) by mouth 3 (three) times daily as needed for dizziness.   metFORMIN 1000 MG tablet Commonly known as:  GLUCOPHAGE TAKE 1 TABLET BY MOUTH TWO  TIMES DAILY WITH MEALS   methocarbamol 500 MG tablet Commonly known as:  ROBAXIN Take 1 tablet (500 mg total) by mouth every 6 (six) hours as needed for muscle spasms.   multivitamin tablet Take 1 tablet by mouth every other day.   oxyCODONE-acetaminophen 5-325 MG tablet Commonly known as:  ROXICET Take 1-2 tablets by mouth every 4 (four) hours as needed.   Pitavastatin Calcium 2 MG Tabs Take 1 tablet (2 mg total) by mouth daily.   ranitidine 150 MG tablet Commonly known as:  ZANTAC TAKE 1 TABLET BY MOUTH TWO  TIMES DAILY   rivaroxaban 10 MG Tabs  tablet Commonly known as:  XARELTO Take 1 tablet (10 mg total) by mouth daily with breakfast.            Durable Medical Equipment        Start     Ordered   08/21/17 1649  DME Walker rolling  Once    Question:  Patient needs a walker to treat with the following condition  Answer:  Status post total right knee replacement   08/21/17 1649   08/21/17 1649  DME 3 n 1  Once     08/21/17 1649       Discharge Care Instructions        Start     Ordered   08/24/17 0000  Discharge patient    Question Answer Comment  Discharge disposition 03-Skilled [place]   Discharge patient date 08/24/2017      08/24/17 0701   08/23/17 0000  methocarbamol (ROBAXIN) 500 MG tablet  Every 6 hours PRN     08/23/17 0734   08/23/17 0000  rivaroxaban (XARELTO) 10 MG TABS tablet  Daily with breakfast     08/23/17 0734   08/23/17 0000  oxyCODONE-acetaminophen (ROXICET) 5-325 MG tablet  Every 4 hours PRN     08/23/17 0734      Diagnostic Studies: Dg Knee Right Port  Result Date: 08/21/2017 CLINICAL DATA:  Status post total knee replacement EXAM: PORTABLE RIGHT KNEE - 1-2 VIEW COMPARISON:  None. FINDINGS: Frontal and lateral views were obtained. Patient is status post total knee replacement with femoral and tibial prosthetic components well-seated. No acute fracture or dislocation. No erosive change. Air within the joint is an expected postoperative finding. IMPRESSION: Total knee replacement with femoral and tibial prosthetic components well-seated. No fracture or dislocation evident. Electronically Signed   By: Lowella Grip III M.D.   On: 08/21/2017 15:44    Disposition: 01-Home or Self Care  Discharge Instructions    Discharge patient    Complete by:  As directed    Discharge disposition:  03-Skilled [place]   Discharge patient date:  08/24/2017       Contact information for follow-up providers    Mcarthur Rossetti, MD Follow up in 2 week(s).   Specialty:   Orthopedic Surgery Contact information: [place] [address] [phone]            Contact information for after-discharge care    Destination    [facility] .   Specialty:  [place] information: [address] [place] [phone]  Signed: Mcarthur Rossetti 08/24/2017, 7:01 AM

## 2017-08-24 NOTE — NC FL2 (Signed)
[place] MEDICAID FL2 LEVEL OF CARE SCREENING TOOL     IDENTIFICATION  Patient Name: [name] Birthdate: [birth year] Sex: female Admission Date (Current Location): 08/21/2017  [facility] and [place] Number:  Herbalist and Address:  [facility] [place], [place] [address], [place], [place] [address]      Provider Number: [id number]  Attending Physician Name and Address:  Mcarthur Rossetti  Relative Name and Phone Number:       Current Level of Care: Hospital Recommended Level of Care: [place] Prior Approval Number:    Date Approved/Denied:   PASRR Number: [id number]  Discharge Plan: SNF    Current Diagnoses: Patient Active Problem List   Diagnosis Date Noted  . Status post total right knee replacement 08/21/2017  . Acute blood loss as cause of postoperative anemia 02/16/2017  . GERD (gastroesophageal reflux disease) 02/16/2017  . Vitamin D deficiency 02/16/2017  . 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 Unilateral primary osteoarthritis, right knee 01/10/2017  . Unilateral primary osteoarthritis, left knee 01/10/2017  . Subclinical hypothyroidism 10/11/2016  . Decreased hearing of left ear 10/11/2016  . Hypertension   . Diabetes mellitus type 2 in obese ([place]) 12/31/2015  . Hypertriglyceridemia 08/30/2015  . Dyspnea 04/02/2013  . Cough 02/12/2013  . Abdominal pain, left lateral 04/03/2012  . Edema 08/22/2011  . Tachycardia 08/22/2011  . Diabetes ([place]) 10/16/2007  . OBESITY, MORBID 10/16/2007  . Essential hypertension 10/16/2007  . Osteoarthrosis involving lower leg 10/16/2007    Orientation RESPIRATION BLADDER Height & Weight     Self, Time, Situation, Place  Normal Continent Weight:   Height:     BEHAVIORAL SYMPTOMS/MOOD NEUROLOGICAL BOWEL NUTRITION STATUS      Continent Diet (regular diet)  AMBULATORY STATUS COMMUNICATION OF NEEDS Skin   Extensive Assist Verbally Surgical wounds, Skin  abrasions, Bruising                       Personal Care Assistance Level of Assistance  Bathing, Feeding, Dressing Bathing Assistance: Limited assistance Feeding assistance: Independent Dressing Assistance: Limited assistance     Functional Limitations Info  Sight, Hearing, Speech Sight Info: Adequate Hearing Info: Adequate Speech Info: Adequate    SPECIAL CARE FACTORS FREQUENCY  PT (By licensed PT), OT (By licensed OT)     PT Frequency: 5x a week OT Frequency: 5x a week            Contractures Contractures Info: Not present    Additional Factors Info  Code Status, Allergies Code Status Info: Full Code Allergies Info: Other, Sulfa Antibiotics           Current Medications (08/24/2017):  This is the current hospital active medication list Current Facility-Administered Medications  Medication Dose Route Frequency Provider Last Rate Last Dose  . 0.9 %  sodium chloride infusion   Intravenous Continuous Mcarthur Rossetti, MD   Stopped at 08/23/17 [phone]  . acetaminophen (TYLENOL) tablet 650 mg  650 mg Oral Q6H PRN Mcarthur Rossetti, MD   650 mg at 08/23/17 2142   Or  . acetaminophen (TYLENOL) suppository 650 mg  650 mg Rectal Q6H PRN Mcarthur Rossetti, MD      . alum & mag hydroxide-simeth (MAALOX/MYLANTA) 200-200-20 MG/5ML suspension 30 mL  30 mL Oral Q4H PRN Mcarthur Rossetti, MD      . cholecalciferol (VITAMIN D) tablet 1,000 Units  1,000 Units Oral Angelica Chessman, MD  1,000 Units at 08/23/17 0909  . diphenhydrAMINE (BENADRYL) 12.5 MG/5ML elixir 12.5-25 mg  12.5-25 mg Oral Q4H PRN Mcarthur Rossetti, MD      . docusate sodium (COLACE) capsule 100 mg  100 mg Oral BID Mcarthur Rossetti, MD   100 mg at 08/23/17 2142  . famotidine (PEPCID) tablet 20 mg  20 mg Oral BID Mcarthur Rossetti, MD   20 mg at 08/23/17 2142  . glimepiride (AMARYL) tablet 4 mg  4 mg Oral Q breakfast Mcarthur Rossetti, MD   4 mg at  08/23/17 0905  . hydrochlorothiazide (MICROZIDE) capsule 12.5 mg  12.5 mg Oral Daily Mcarthur Rossetti, MD   12.5 mg at 08/23/17 0909  . HYDROmorphone (DILAUDID) injection 1 mg  1 mg Intravenous Q2H PRN Mcarthur Rossetti, MD   1 mg at 08/22/17 1550  . insulin aspart (novoLOG) injection 0-20 Units  0-20 Units Subcutaneous TID WC Mcarthur Rossetti, MD   3 Units at 08/23/17 1729  . insulin aspart (novoLOG) injection 0-5 Units  0-5 Units Subcutaneous QHS Mcarthur Rossetti, MD   2 Units at 08/22/17 2103  . losartan (COZAAR) tablet 100 mg  100 mg Oral Daily Mcarthur Rossetti, MD   100 mg at 08/23/17 0909  . meclizine (ANTIVERT) tablet 25 mg  25 mg Oral TID PRN Mcarthur Rossetti, MD      . menthol-cetylpyridinium (CEPACOL) lozenge 3 mg  1 lozenge Oral PRN Mcarthur Rossetti, MD       Or  . phenol (CHLORASEPTIC) mouth spray 1 spray  1 spray Mouth/Throat PRN Mcarthur Rossetti, MD      . metFORMIN (GLUCOPHAGE) tablet 1,000 mg  1,000 mg Oral BID WC Mcarthur Rossetti, MD   1,000 mg at 08/23/17 1729  . methocarbamol (ROBAXIN) tablet 500 mg  500 mg Oral Q6H PRN Mcarthur Rossetti, MD   500 mg at 08/23/17 2142   Or  . methocarbamol (ROBAXIN) 500 mg in dextrose 5 % 50 mL IVPB  500 mg Intravenous Q6H PRN Mcarthur Rossetti, MD      . metoCLOPramide (REGLAN) tablet 5-10 mg  5-10 mg Oral Q8H PRN Mcarthur Rossetti, MD       Or  . metoCLOPramide (REGLAN) injection 5-10 mg  5-10 mg Intravenous Q8H PRN Mcarthur Rossetti, MD      . multivitamin with minerals tablet 1 tablet  1 tablet Oral Beverly Gust Mcarthur Rossetti, MD   1 tablet at 08/23/17 [phone]  . ondansetron (ZOFRAN) tablet 4 mg  4 mg Oral Q6H PRN Mcarthur Rossetti, MD       Or  . ondansetron [facility]) injection 4 mg  4 mg Intravenous Q6H PRN Mcarthur Rossetti, MD      . oxyCODONE (Oxy IR/ROXICODONE) immediate release tablet 5-15 mg  5-15 mg Oral Q3H PRN Mcarthur Rossetti,  MD   15 mg at 08/23/17 2142  . polyethylene glycol (MIRALAX / GLYCOLAX) packet 17 g  17 g Oral Daily PRN Mcarthur Rossetti, MD      . pravastatin (PRAVACHOL) tablet 40 mg  40 mg Oral q1800 Mcarthur Rossetti, MD   40 mg at 08/23/17 1729  . rivaroxaban (XARELTO) tablet 10 mg  10 mg Oral Q breakfast Mcarthur Rossetti, MD   10 mg at 08/23/17 0905  . vitamin E capsule 400 Units  400 Units Oral Angelica Chessman, MD   400 Units at 08/22/17 1012  Discharge Medications: Please see discharge summary for a list of discharge medications.  Relevant Imaging Results:  Relevant Lab Results:   Additional Information SSN: [id number]  Normajean Baxter, LCSW

## 2017-08-24 NOTE — Progress Notes (Signed)
Report called to nurse at [facility]. Packet and scripts ready for transporters. VSS.

## 2017-08-24 NOTE — Progress Notes (Signed)
Patient ID: [name], female   DOB: Apr 17, 1951, 66 y.o.   MRN: [id number] Doing well overall.  Right knee stable.  Can be discharged to skilled nursing today.

## 2017-08-24 NOTE — Progress Notes (Signed)
Physical Therapy Treatment Patient Details Name: [name] MRN: [id number] DOB: Jul 07, 1951 Today's Date: 08/24/2017    History of Present Illness Pt is a 66 y.o. female s/p R TKA. PMH consists of L TKA (Feb 2018), HTN, DM, and obesity.    PT Comments    Pt presents with increased difficulty when ascending from edge of bed in standard seat height.  Pt positioned in bed remains with RLE ER and cues and education provided to be mindful to keep RLE in neutral position.  Pt educated on frequency of HEP and advance gait during session.  Plan for SNF remains appropriate during session as she remains to require physical assistance for functional mobility.     Follow Up Recommendations  SNF;Supervision/Assistance - 24 hour     Equipment Recommendations  None recommended by PT    Recommendations for Other Services       Precautions / Restrictions Precautions Precautions: Knee Restrictions Weight Bearing Restrictions: Yes RLE Weight Bearing: Weight bearing as tolerated    Mobility  Bed Mobility Overal bed mobility: Needs Assistance Bed Mobility: Supine to Sit     Supine to sit: Min assist;HOB elevated     General bed mobility comments: Pt required assist to advance RLE to edge of bed.  Pt with heavy use of rail to elevate trunk into sitting.    Transfers Overall transfer level: Needs assistance Equipment used: Rolling walker (2 wheeled) Transfers: Sit to/from Stand Sit to Stand: Mod assist;Min guard         General transfer comment: mod assist from edge of bed with assist to boost into standing and cues for hand placement.  min guard from [facility], improve ability when arm rests are present to push from.  Pt remains slow and guarded.    Ambulation/Gait Ambulation/Gait assistance: Min guard Ambulation Distance (Feet): 45 Feet Assistive device: Rolling walker (2 wheeled) Gait Pattern/deviations: Step-to pattern;Decreased stride length;Antalgic;Decreased weight shift to  right;Decreased stance time - right Gait velocity: decreased Gait velocity interpretation: Below normal speed for age/gender General Gait Details: Cues for upper trunk control and increasing stride length.     Stairs            Wheelchair Mobility    Modified Rankin (Stroke Patients Only)       Balance Overall balance assessment: Needs assistance Sitting-balance support: Feet supported;No upper extremity supported Sitting balance-Leahy Scale: Good       Standing balance-Leahy Scale: Poor Standing balance comment: RW for support                            Cognition Arousal/Alertness: Awake/alert Behavior During Therapy: WFL for tasks assessed/performed Overall Cognitive Status: Within Functional Limits for tasks assessed                                        Exercises Total Joint Exercises Ankle Circles/Pumps: AROM;10 reps;Both Quad Sets: AROM;Right;10 reps Short Arc Quad: AROM;Right;10 reps Heel Slides: AAROM;Right;10 reps Hip ABduction/ADduction: Right;10 reps;Supine;AAROM Straight Leg Raises: AAROM;Right;10 reps;Supine Goniometric ROM: grossly 0-75 degrees R knee flexion.      General Comments        Pertinent Vitals/Pain Pain Assessment: 0-10 Pain Score: 6  Pain Location: R knee Pain Descriptors / Indicators: Burning;Aching Pain Intervention(s): Monitored during session;Repositioned (refused ice pac)    Home Living  Prior Function            PT Goals (current goals can now be found in the care plan section) Acute Rehab PT Goals Patient Stated Goal: rehab then home Potential to Achieve Goals: Good Progress towards PT goals: Progressing toward goals    Frequency    7X/week      PT Plan Current plan remains appropriate    Co-evaluation              AM-PAC PT "6 Clicks" Daily Activity  Outcome Measure  Difficulty turning over in bed (including adjusting bedclothes,  sheets and blankets)?: Unable Difficulty moving from lying on back to sitting on the side of the bed? : Unable Difficulty sitting down on and standing up from a chair with arms (e.g., wheelchair, bedside commode, etc,.)?: Unable Help needed moving to and from a bed to chair (including a wheelchair)?: A Lot Help needed walking in hospital room?: A Little Help needed climbing 3-5 steps with a railing? : A Lot 6 Click Score: 10    End of Session Equipment Utilized During Treatment: Gait belt Activity Tolerance: Patient tolerated treatment well Patient left: in chair;with call bell/phone within reach Nurse Communication: Mobility status PT Visit Diagnosis: Pain;Other abnormalities of gait and mobility (R26.89);Difficulty in walking, not elsewhere classified (R26.2) Pain - Right/Left: Right Pain - part of body: Knee     Time: 5449-2010 PT Time Calculation (min) (ACUTE ONLY): 29 min  Charges:  $Gait Training: 8-22 mins $Therapeutic Exercise: 8-22 mins                    G Codes:       Governor Rooks, PTA pager [place] 08/24/2017, 10:35 AM

## 2017-08-24 NOTE — Clinical Social Work Placement (Signed)
   CLINICAL SOCIAL WORK PLACEMENT  NOTE  Date:  08/24/2017  Patient Details  Name: [name] MRN: [id number] Date of Birth: [birth year]  Clinical Social Work is seeking post-discharge placement for this patient at the [place] level of care (*CSW will initial, date and re-position this form in  chart as items are completed):  Yes   Patient/family provided with [facility] Work Department's list of facilities offering this level of care within the geographic area requested by the patient (or if unable, by the patient's family).  Yes   Patient/family informed of their freedom to choose among providers that offer the needed level of care, that participate in Medicare, Medicaid or managed care program needed by the patient, have an available bed and are willing to accept the patient.  Yes   Patient/family informed of [facility]'s ownership interest in [facility] and [facility], as well as of the fact that they are under no obligation to receive care at these facilities.  PASRR submitted to EDS on       PASRR number received on       Existing PASRR number confirmed on 08/22/17     FL2 transmitted to all facilities in geographic area requested by pt/family on 08/22/17     FL2 transmitted to all facilities within larger geographic area on       Patient informed that his/her managed care company has contracts with or will negotiate with certain facilities, including the following:        Yes   Patient/family informed of bed offers received.  Patient chooses bed at [facility]     Physician recommends and patient chooses bed at      Patient to be transferred to [facility] on 08/24/17.  Patient to be transferred to facility by [facility]tar     Patient family notified on 08/24/17 of transfer.  Name of family member notified:  Patient responsible for self     PHYSICIAN Please sign FL2, Please prepare priority  discharge summary, including medications, Please prepare prescriptions     Additional Comment:    _______________________________________________ Normajean Baxter, LCSW 08/24/2017, 11:06 AM

## 2017-08-24 NOTE — Social Work (Signed)
Clinical Social Worker facilitated patient discharge including contacting patient family and facility to confirm patient discharge plans.  Clinical information faxed to facility and family agreeable with plan.    CSW arranged ambulance transport via [facility] to [place].    RN to call [phone] to give report prior to discharge.  Clinical Social Worker will sign off for now as social work intervention is no longer needed. Please consult us again if new need arises.  Hani Patnode, LCSW Clinical Social Worker [phone]    

## 2017-08-24 NOTE — Care Management Important Message (Signed)
Important Message  Patient Details  Name: RHODIE CIENFUEGOS MRN: [id number] Date of Birth: 1951-11-08   Medicare Important Message Given:  Yes    Oniel Meleski Montine Circle 08/24/2017, 11:38 AM

## 2017-08-27 ENCOUNTER — Telehealth (INDEPENDENT_AMBULATORY_CARE_PROVIDER_SITE_OTHER): Payer: Self-pay | Admitting: *Deleted

## 2017-08-27 ENCOUNTER — Non-Acute Institutional Stay (SKILLED_NURSING_FACILITY): Payer: Medicare Other | Admitting: Internal Medicine

## 2017-08-27 ENCOUNTER — Encounter: Payer: Self-pay | Admitting: Internal Medicine

## 2017-08-27 DIAGNOSIS — H8113 Benign paroxysmal vertigo, bilateral: Secondary | ICD-10-CM

## 2017-08-27 DIAGNOSIS — E669 Obesity, unspecified: Secondary | ICD-10-CM

## 2017-08-27 DIAGNOSIS — Z96651 Presence of right artificial knee joint: Secondary | ICD-10-CM | POA: Diagnosis not present

## 2017-08-27 DIAGNOSIS — E785 Hyperlipidemia, unspecified: Secondary | ICD-10-CM

## 2017-08-27 DIAGNOSIS — I10 Essential (primary) hypertension: Secondary | ICD-10-CM | POA: Diagnosis not present

## 2017-08-27 DIAGNOSIS — M1711 Unilateral primary osteoarthritis, right knee: Secondary | ICD-10-CM | POA: Diagnosis not present

## 2017-08-27 DIAGNOSIS — D62 Acute posthemorrhagic anemia: Secondary | ICD-10-CM

## 2017-08-27 DIAGNOSIS — M109 Gout, unspecified: Secondary | ICD-10-CM

## 2017-08-27 DIAGNOSIS — E1169 Type 2 diabetes mellitus with other specified complication: Secondary | ICD-10-CM | POA: Diagnosis not present

## 2017-08-27 NOTE — Progress Notes (Addendum)
: Provider:  Noah Delaine. Sheppard Coil, MD Location:  [place] Room Number: [phone] Place of Service:  SNF (31)  PCP: Girtha Rm, NP-C Patient Care Team: Girtha Rm, NP-C as PCP - General (Nurse Practitioner)  Extended Emergency Contact Information Primary Emergency Contact: Diggs,Helen  Johnnette Litter of [place] Phone: [phone] Relation: Sister Secondary Emergency Contact: Donata Duff [place] of [place] Phone: [phone] Relation: Sister     Allergies: Other and Sulfa antibiotics  Chief Complaint  Patient presents with  . New Admit To SNF    following hospitalization 08/21/17 to 08/24/17 Right Total Knee Arthroplasty, Dr. Jean Rosenthal    HPI: Patient is 66 y.o. female with morbid obesity, hypertension, GERD, diabetes mellitus type 2, who was admitted to [facility] on 9/4-7 for a planned total knee arthroplasty plasty for end-stage osteoarthritis of the right knee. There were no complications and patient is admitted to skilled nursing facility for OT/PT. While at skilled nursing facility patient will be followed for diabetes mellitus treated with me per my metformin, hypertension treated with Hyzaar, and vitamin D deficiency treated with replacement.  Past Medical History:  Diagnosis Date  . Abdominal hernia   . Acute blood loss as cause of postoperative anemia 02/16/2017  . Arthritis    knees, back, R ankle   . Bilateral carpal tunnel syndrome 04/29/2012  . Diabetes mellitus   . Diabetes mellitus type 2 in obese ([place]) 12/31/2015  . Edema    legs  . GERD (gastroesophageal reflux disease)   . HOH (hard of hearing)   . Hypertension   . Lipoma 09/30/2012  . Obesity    morbid obesity  . OBESITY, MORBID 10/16/2007   Qualifier: Diagnosis of  By: Enrique Sack    . Osteoarthrosis involving lower leg 10/16/2007   Qualifier: Diagnosis of  By: Enrique Sack    . Status post total left knee replacement  02/06/2017  . Status post total right knee replacement 08/21/2017  . Subclinical hypothyroidism 10/11/2016  . Tachycardia 08/22/2011  . Vertigo   . Vitamin D deficiency 02/16/2017    Past Surgical History:  Procedure Laterality Date  . [place]?   right  . CARPAL TUNNEL RELEASE Left 07/10/2014   Procedure: LEFT ENDOSCOPIC CARPAL TUNNEL RELEASE ;  Surgeon: Jolyn Nap, MD;  Location: [facility];  Service: Orthopedics;  Laterality: Left;  . CARPAL TUNNEL RELEASE Right   . COLONOSCOPY    . HERNIA REPAIR  07/03/2011   abd hernia  . NOSE SURGERY  2008   sinus  . TOTAL KNEE ARTHROPLASTY Left 02/06/2017   Procedure: LEFT TOTAL KNEE ARTHROPLASTY;  Surgeon: Mcarthur Rossetti, MD;  Location: [facility];  Service: Orthopedics;  Laterality: Left;  . TOTAL KNEE ARTHROPLASTY Right 08/21/2017  . TOTAL KNEE ARTHROPLASTY Right 08/21/2017   Procedure: RIGHT TOTAL KNEE ARTHROPLASTY;  Surgeon: Mcarthur Rossetti, MD;  Location: [facility];  Service: Orthopedics;  Laterality: Right;  . TUBAL LIGATION      Allergies as of 08/27/2017      Reactions   Other Other (See Comments)   NO Blood or Blood products   Sulfa Antibiotics Rash      Medication List       Accurate as of 08/27/17 11:59 PM. Always use your most recent med list.          aspirin EC 81 MG tablet Take 81 mg by mouth daily.  cholecalciferol 1000 units tablet Commonly known as:  VITAMIN D Take 1,000 Units by mouth every other day.   Co Q-10 50 MG Caps Take 50 mg by mouth every other day.   glimepiride 4 MG tablet Commonly known as:  AMARYL TAKE 1 TABLET BY MOUTH  EVERY DAY WITH BREAKFAST   losartan-hydrochlorothiazide 100-12.5 MG tablet Commonly known as:  HYZAAR TAKE 1 TABLET BY MOUTH  DAILY   meclizine 25 MG tablet Commonly known as:  ANTIVERT Take 1 tablet (25 mg total) by mouth 3 (three) times daily as needed for dizziness.   metFORMIN 1000 MG tablet Commonly known as:   GLUCOPHAGE TAKE 1 TABLET BY MOUTH TWO  TIMES DAILY WITH MEALS   methocarbamol 500 MG tablet Commonly known as:  ROBAXIN Take 1 tablet (500 mg total) by mouth every 6 (six) hours as needed for muscle spasms.   multivitamin tablet Take 1 tablet by mouth every other day.   oxycodone 5 MG capsule Commonly known as:  OXY-IR Take 5 mg by mouth. Take one capsule every 4 hours as needed for pain   Pitavastatin Calcium 2 MG Tabs Take 1 tablet (2 mg total) by mouth daily.   ranitidine 150 MG tablet Commonly known as:  ZANTAC TAKE 1 TABLET BY MOUTH TWO  TIMES DAILY   rivaroxaban 10 MG Tabs tablet Commonly known as:  XARELTO Take 1 tablet (10 mg total) by mouth daily with breakfast.       Meds ordered this encounter  Medications  . oxycodone (OXY-IR) 5 MG capsule    Sig: Take 5 mg by mouth. Take one capsule every 4 hours as needed for pain    Immunization History  Administered Date(s) Administered  . Influenza,inj,Quad PF,6+ Mos 09/21/2016  . PPD Test 02/09/2017  . Pneumococcal Polysaccharide-23 08/01/2006  . Td 12/21/2003    Social History  Substance Use Topics  . Smoking status: Never Smoker  . Smokeless tobacco: Never Used  . Alcohol use No    Family history is   Family History  Problem Relation Age of Onset  . Hypertension Mother   . Kidney disease Mother   . Diabetes Father   . Hypertension Father   . Diabetes Sister   . Hypertension Sister   . Diabetes Brother   . Hypertension Brother   . Heart disease Brother 36       heart attack  . Hypertension Sister       Review of Systems  DATA OBTAINED: from patient- Complaint of pain and swelling in her MCP of her right great toe onset yesterday; no history of gout, no family history of gout GENERAL:  no fevers, fatigue, appetite changes SKIN: No itching, or rash EYES: No eye pain, redness, discharge EARS: No earache, tinnitus, change in hearing NOSE: No congestion, drainage or bleeding  MOUTH/THROAT: No  mouth or tooth pain, No sore throat RESPIRATORY: No cough, wheezing, SOB CARDIAC: No chest pain, palpitations, lower extremity edema  GI: No abdominal pain, No N/V/D or constipation, No heartburn or reflux  GU: No dysuria, frequency or urgency, or incontinence  MUSCULOSKELETAL: No unrelieved bone/joint pain NEUROLOGIC: No headache, dizziness or focal weakness PSYCHIATRIC: No c/o anxiety or sadness   Vitals:   08/27/17 0943  BP: 130/60  Pulse: 70  Resp: 18  Temp: 97.9 F (36.6 C)  SpO2: 95%    SpO2 Readings from Last 1 Encounters:  08/27/17 95%   Body mass index is 51.65 kg/m.     Physical Exam  GENERAL APPEARANCE: Alert, conversant,  No acute distress.  SKIN: No diaphoresis rash; Incision area without unusual warmth swelling or redness HEAD: Normocephalic, atraumatic  EYES: Conjunctiva/lids clear. Pupils round, reactive. EOMs intact.  EARS: External exam WNL, canals clear. Hearing grossly normal.  NOSE: No deformity or discharge.  MOUTH/THROAT: Lips w/o lesions  RESPIRATORY: Breathing is even, unlabored. Lung sounds are clear   CARDIOVASCULAR: Heart RRR no murmurs, rubs or gallops. No peripheral edema.   GASTROINTESTINAL: Abdomen is soft, non-tender, not distended w/ normal bowel sounds. GENITOURINARY: Bladder non tender, not distended  MUSCULOSKELETAL: Moderate redness swelling in extreme tenderness to right great toe MCP NEUROLOGIC:  Cranial nerves 2-12 grossly intact. Moves all extremities  PSYCHIATRIC: Mood and affect appropriate to situation, no behavioral issues  Patient Active Problem List   Diagnosis Date Noted  . Benign positional vertigo 08/30/2017  . Hyperlipidemia associated with type 2 diabetes mellitus ([place]) 08/30/2017  . Acute gouty arthritis 08/30/2017  . Status post total right knee replacement 08/21/2017  . Acute blood loss as cause of postoperative anemia 02/16/2017  . GERD (gastroesophageal reflux disease) 02/16/2017  . Vitamin D deficiency  02/16/2017  . 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 Unilateral primary osteoarthritis, right knee 01/10/2017  . Unilateral primary osteoarthritis, left knee 01/10/2017  . Subclinical hypothyroidism 10/11/2016  . Decreased hearing of left ear 10/11/2016  . Hypertension   . Diabetes mellitus type 2 in obese ([place]) 12/31/2015  . Hypertriglyceridemia 08/30/2015  . Dyspnea 04/02/2013  . Cough 02/12/2013  . Lipoma 09/30/2012  . Bilateral carpal tunnel syndrome 04/29/2012  . Abdominal pain, left lateral 04/03/2012  . Edema 08/22/2011  . Tachycardia 08/22/2011  . Diabetes ([place]) 10/16/2007  . OBESITY, MORBID 10/16/2007  . Essential hypertension 10/16/2007  . Osteoarthrosis involving lower leg 10/16/2007      Labs reviewed: Basic Metabolic Panel:    Component Value Date/Time   NA 137 08/22/2017 0321   NA 142 02/14/2017   K 4.2 08/22/2017 0321   CL 104 08/22/2017 0321   CO2 25 08/22/2017 0321   GLUCOSE 174 (H) 08/22/2017 0321   BUN 20 08/22/2017 0321   BUN 21 02/14/2017   CREATININE 1.19 (H) 08/22/2017 0321   CREATININE 1.21 (H) 05/29/2017 0851   CALCIUM 8.5 (L) 08/22/2017 0321   PROT 6.7 05/12/2017 0607   ALBUMIN 3.4 (L) 05/12/2017 0607   AST 18 05/12/2017 0607   ALT 11 (L) 05/12/2017 0607   ALKPHOS 66 05/12/2017 0607   BILITOT 0.4 05/12/2017 0607   GFRNONAA 48 (L) 08/22/2017 0321   GFRNONAA 52 (L) 09/29/2016 0811   GFRAA 55 (L) 08/22/2017 0321   GFRAA 60 09/29/2016 0811     Recent Labs  05/29/17 0851 08/15/17 1559 08/22/17 0321  NA 137 138 137  K 4.6 4.2 4.2  CL 100 103 104  CO2 26 25 25   GLUCOSE 147* 156* 174*  BUN 20 24* 20  CREATININE 1.21* 1.29* 1.19*  CALCIUM 9.4 9.5 8.5*   Liver Function Tests:  Recent Labs  09/29/16 0811 05/12/17 0607  AST 8* 18  ALT 6 11*  ALKPHOS 68 66  BILITOT 0.4 0.4  PROT 7.0 6.7  ALBUMIN 3.8 3.4*    Recent Labs  05/12/17 0607  LIPASE 21   No results for input(s): AMMONIA in the last 8760  hours. CBC:  Recent Labs  09/29/16 0811  05/12/17 0607 05/12/17 0651 08/15/17 1559 08/22/17 0321  WBC 7.1  < > 8.4  --  8.1 10.5  NEUTROABS 4,899  --  6.0  --   --   --   HGB 11.4*  < > 10.0* 10.5* 10.9* 9.5*  HCT 36.1  < > 33.9* 31.0* 36.8 31.9*  MCV 92.6  < > 95.5  --  95.8 95.8  PLT 293  < > 254  --  291 286  < > = values in this interval not displayed. Lipid  Recent Labs  09/29/16 0811 05/29/17 0851  CHOL 146 210*  HDL 30* 29*  LDLCALC 69 123*  TRIG 237* 289*    Cardiac Enzymes: No results for input(s): CKTOTAL, CKMB, CKMBINDEX, TROPONINI in the last 8760 hours. BNP: No results for input(s): BNP in the last 8760 hours. Lab Results  Component Value Date   MICROALBUR 0.4 10/11/2016   Lab Results  Component Value Date   HGBA1C 6.6 (H) 08/15/2017   Lab Results  Component Value Date   TSH 4.05 11/08/2016   No results found for: VITAMINB12 No results found for: FOLATE No results found for: IRON, TIBC, FERRITIN  Imaging and Procedures obtained prior to SNF admission: No results found.   Not all labs, radiology exams or other studies done during hospitalization come through on my EPIC note; however they are reviewed by me.    Assessment and Plan  RIGHT KNEE ARTHROPLASTY-no complications; patient is being prophylaxed with xarelto to 10 mg daily SNF -patient admitted for OT/PT; plan to continue Xarelto 10 mg by mouth daily we will do this for 3 weeks it is not stated in the discharge summary how long the surgery and wishes for patient to be on chemical prophylaxis  ACUTE BLOOD LOSS ANEMIA POST OP-postop hemoglobin is 9.5 SNF - will follow-up CBC  DM 2-controlled. Hemoglobin A1c 6.6 SNF - plan to continue prehospital drugs of metformin 1000 mg twice a day and gloomy per minute 4 mg by mouth daily; patient is on ARB and statin  HYPERTENSION SNF - controlled; continue Hyzaar 10-12 0.5 one by mouth daily  VITAMIN D DEFICIENCY SNF- plan to continue  replacement 1000 units by mouth daily  BPPV SNF - stable continue meclizine 25 mg by mouth 3 times a day when necessary  ACUTE GOUTY ARTHRITIS-acute new diagnosis SNF - diagnosis once at skilled nursing facility because of its location being the MCP joint of the great toe and because of his extreme tenderness it seems very typical for acute gout so will treat with colchicine 1.2 mg by mouth now and 0.6 mg in 1 hour   Time spent greater than 45 minutes;> 50% of time with patient was spent reviewing records, labs, tests and studies, counseling and developing plan of care  Webb Silversmith D. Sheppard Coil, MD

## 2017-08-27 NOTE — Telephone Encounter (Signed)
Pt called left message on vm stating she was at [facility] and her great toe was swollen and could not put pressure on it. I C pt back left message so I can get more information from her and try to work her in. Left message stating to make appt with one of our providers here to evaluate her toe.

## 2017-08-29 LAB — BASIC METABOLIC PANEL
BUN: 22 — AB (ref 4–21)
Creatinine: 0.9 (ref 0.5–1.1)
Glucose: 158
Potassium: 5.2 (ref 3.4–5.3)
Sodium: 139 (ref 137–147)

## 2017-08-29 LAB — CBC AND DIFFERENTIAL
HCT: 27 — AB (ref 36–46)
Hemoglobin: 8.2 — AB (ref 12.0–16.0)
Platelets: 404 — AB (ref 150–399)
WBC: 8.7

## 2017-08-30 ENCOUNTER — Encounter: Payer: Self-pay | Admitting: Internal Medicine

## 2017-08-30 DIAGNOSIS — M109 Gout, unspecified: Secondary | ICD-10-CM | POA: Insufficient documentation

## 2017-08-30 DIAGNOSIS — E785 Hyperlipidemia, unspecified: Secondary | ICD-10-CM

## 2017-08-30 DIAGNOSIS — E1169 Type 2 diabetes mellitus with other specified complication: Secondary | ICD-10-CM | POA: Insufficient documentation

## 2017-08-30 DIAGNOSIS — H8110 Benign paroxysmal vertigo, unspecified ear: Secondary | ICD-10-CM | POA: Insufficient documentation

## 2017-08-30 NOTE — Addendum Note (Signed)
Addended by: Inocencio Homes D on: 08/30/2017 11:44 AM   Modules accepted: Level of Service

## 2017-08-31 ENCOUNTER — Other Ambulatory Visit: Payer: Self-pay

## 2017-09-05 ENCOUNTER — Inpatient Hospital Stay (INDEPENDENT_AMBULATORY_CARE_PROVIDER_SITE_OTHER): Payer: Medicare Other | Admitting: Physician Assistant

## 2017-09-05 ENCOUNTER — Ambulatory Visit (INDEPENDENT_AMBULATORY_CARE_PROVIDER_SITE_OTHER): Payer: Medicare Other | Admitting: Orthopaedic Surgery

## 2017-09-05 DIAGNOSIS — Z96651 Presence of right artificial knee joint: Secondary | ICD-10-CM

## 2017-09-05 NOTE — Progress Notes (Signed)
[name] is now 2 weeks status post a right total knee arthroplasty. She is doing excellent. She says that the skilled nursing facilities done a good job of rehabilitating her to physical therapy is done well with her. She is planning to stay there at least another week. She lives home alone. She stated there before when she had her left total knee replacement. She said she's been able to flex her knee to 98 thus far. Her hip on exam her calf is soft on the right side. Incisions well-healed and tables a been removed and Steri-Strips placed. She does have good range of motion of her knee thus far.  At this point she will continue therapy for the skilled nursing facility and then transition to home therapy. I'll see her back in a month see how she doing overall. No x-rays of be needed unless there is any issues. I gave notes to the nursing care facility to continue her therapy and stop the Xarelto.

## 2017-09-11 ENCOUNTER — Encounter: Payer: Self-pay | Admitting: Internal Medicine

## 2017-09-11 ENCOUNTER — Non-Acute Institutional Stay (SKILLED_NURSING_FACILITY): Payer: Medicare Other | Admitting: Internal Medicine

## 2017-09-11 DIAGNOSIS — E669 Obesity, unspecified: Secondary | ICD-10-CM | POA: Diagnosis not present

## 2017-09-11 DIAGNOSIS — M109 Gout, unspecified: Secondary | ICD-10-CM

## 2017-09-11 DIAGNOSIS — H8113 Benign paroxysmal vertigo, bilateral: Secondary | ICD-10-CM | POA: Diagnosis not present

## 2017-09-11 DIAGNOSIS — E559 Vitamin D deficiency, unspecified: Secondary | ICD-10-CM

## 2017-09-11 DIAGNOSIS — E1169 Type 2 diabetes mellitus with other specified complication: Secondary | ICD-10-CM

## 2017-09-11 DIAGNOSIS — M1711 Unilateral primary osteoarthritis, right knee: Secondary | ICD-10-CM | POA: Diagnosis not present

## 2017-09-11 DIAGNOSIS — D62 Acute posthemorrhagic anemia: Secondary | ICD-10-CM

## 2017-09-11 DIAGNOSIS — I10 Essential (primary) hypertension: Secondary | ICD-10-CM | POA: Diagnosis not present

## 2017-09-11 DIAGNOSIS — Z96651 Presence of right artificial knee joint: Secondary | ICD-10-CM | POA: Diagnosis not present

## 2017-09-11 NOTE — Progress Notes (Signed)
Location:  [place] Room Number: [place]:  SNF (31)  Kaylee Delaine. Sheppard Coil, MD  PCP: Girtha Rm, NP-C Patient Care Team: Girtha Rm, NP-C as PCP - General (Nurse Practitioner)  Extended Emergency Contact Information Primary Emergency Contact: [place] Phone: [phone] Relation: Sister Secondary Emergency Contact: Donata Duff [place] Phone: [phone] Relation: Sister  Allergies  Allergen Reactions  . Other Other (See Comments)    NO Blood or Blood products  . Sulfa Antibiotics Rash    Chief Complaint  Patient presents with  . Discharge Note    discharge from SNF to home    HPI:  66 y.o. female with morbid obesity, hypertension, GERD, diabetes mellitus type 2, who was admitted to [facility] from 9/4-7 for a planned total knee arthroplasty for end-stage osteoarthritis of the right knee. There were no medications and patient was admitted to skilled nursing facility for OT PT and patient is now ready to be discharged to home.     Past Medical History:  Diagnosis Date  . Abdominal hernia   . Acute blood loss as cause of postoperative anemia 02/16/2017  . Arthritis    knees, back, R ankle   . Bilateral carpal tunnel syndrome 04/29/2012  . Diabetes mellitus   . Diabetes mellitus type 2 in obese ([place]) 12/31/2015  . Edema    legs  . GERD (gastroesophageal reflux disease)   . HOH (hard of hearing)   . Hypertension   . Lipoma 09/30/2012  . Obesity    morbid obesity  . OBESITY, MORBID 10/16/2007   Qualifier: Diagnosis of  By: Enrique Sack    . Osteoarthrosis involving lower leg 10/16/2007   Qualifier: Diagnosis of  By: Enrique Sack    . 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 Status post total right knee replacement 08/21/2017  . Subclinical hypothyroidism 10/11/2016  . Tachycardia 08/22/2011  . Vertigo   . Vitamin D  deficiency 02/16/2017    Past Surgical History:  Procedure Laterality Date  . [place]?   right  . CARPAL TUNNEL RELEASE Left 07/10/2014   Procedure: LEFT ENDOSCOPIC CARPAL TUNNEL RELEASE ;  Surgeon: Jolyn Nap, MD;  Location: [place];  Service: Orthopedics;  Laterality: Left;  . CARPAL TUNNEL RELEASE Right   . COLONOSCOPY    . HERNIA REPAIR  07/03/2011   abd hernia  . NOSE SURGERY  2008   sinus  . TOTAL KNEE ARTHROPLASTY Left 02/06/2017   Procedure: LEFT TOTAL KNEE ARTHROPLASTY;  Surgeon: Mcarthur Rossetti, MD;  Location: [place];  Service: Orthopedics;  Laterality: Left;  . TOTAL KNEE ARTHROPLASTY Right 08/21/2017  . TOTAL KNEE ARTHROPLASTY Right 08/21/2017   Procedure: RIGHT TOTAL KNEE ARTHROPLASTY;  Surgeon: Mcarthur Rossetti, MD;  Location: [facility];  Service: Orthopedics;  Laterality: Right;  . TUBAL LIGATION       reports that she has never smoked. She has never used smokeless tobacco. She reports that she does not drink alcohol or use drugs. Social History   Social History  . Marital status: Single    Spouse name: N/A  . Number of children: 2  . Years of education: N/A   Occupational History  . UNEMPLOYED textile Unemployed   Social History Main Topics  . Smoking status: Never Smoker  . Smokeless tobacco: Never Used  . Alcohol use No  .  Drug use: No  . Sexual activity: No   Other Topics Concern  . Not on file   Social History Narrative   Admitted to [facility] 08/24/17   Divorced   [place] Witness    Never smoked   Alcohol none   Full Code    Pertinent  Health Maintenance Due  Topic Date Due  . COLONOSCOPY  09/15/2003  . INFLUENZA VACCINE  07/18/2017  . HEMOGLOBIN A1C  02/14/2018  . FOOT EXAM  05/29/2018  . OPHTHALMOLOGY EXAM  07/18/2018  . MAMMOGRAM  10/03/2018  . PAP SMEAR  09/22/2019    Medications: Allergies as of 09/11/2017      Reactions   Other Other (See Comments)   NO Blood  or Blood products   Sulfa Antibiotics Rash      Medication List       Accurate as of 09/11/17 12:17 PM. Always use your most recent med list.          aspirin EC 81 MG tablet Take 81 mg by mouth daily.   cholecalciferol 1000 units tablet Commonly known as:  VITAMIN D Take 1,000 Units by mouth every other day.   Co Q-10 50 MG Caps Take 50 mg by mouth every other day.   glimepiride 4 MG tablet Commonly known as:  AMARYL TAKE 1 TABLET BY MOUTH  EVERY DAY WITH BREAKFAST   losartan-hydrochlorothiazide 100-12.5 MG tablet Commonly known as:  HYZAAR TAKE 1 TABLET BY MOUTH  DAILY   meclizine 25 MG tablet Commonly known as:  ANTIVERT Take 1 tablet (25 mg total) by mouth 3 (three) times daily as needed for dizziness.   metFORMIN 1000 MG tablet Commonly known as:  GLUCOPHAGE TAKE 1 TABLET BY MOUTH TWO  TIMES DAILY WITH MEALS   methocarbamol 500 MG tablet Commonly known as:  ROBAXIN Take 1 tablet (500 mg total) by mouth every 6 (six) hours as needed for muscle spasms.   multivitamin tablet Take 1 tablet by mouth every other day.   oxycodone 5 MG capsule Commonly known as:  OXY-IR Take 5 mg by mouth. Take one capsule every 4 hours as needed for mild pain. Take 2 tablets every 6 hours as needed for severe pain   Pitavastatin Calcium 2 MG Tabs Take 1 tablet (2 mg total) by mouth daily.   ranitidine 150 MG tablet Commonly known as:  ZANTAC TAKE 1 TABLET BY MOUTH TWO  TIMES DAILY   rivaroxaban 10 MG Tabs tablet Commonly known as:  XARELTO Take 1 tablet (10 mg total) by mouth daily with breakfast.   vitamin E 400 UNIT capsule Generic drug:  vitamin E Take 400 Units by mouth. Take one vitamin E every other day        Vitals:   09/11/17 1004  BP: 115/60  Pulse: 80  Resp: 18  Temp: 97.6 F (36.4 C)  SpO2: 94%  Weight: (!) 315 lb (142.9 kg)  Height: 5\' 6"  (1.676 m)   Body mass index is 50.84 kg/m.  Physical Exam  GENERAL APPEARANCE: Alert, conversant. No  acute distress; Morbidly obese.  HEENT: Unremarkable. RESPIRATORY: Breathing is even, unlabored. Lung sounds are clear   CARDIOVASCULAR: Heart RRR no murmurs, rubs or gallops. No peripheral edema.  GASTROINTESTINAL: Abdomen is soft, non-tender, not distended w/ normal bowel sounds.  NEUROLOGIC: Cranial nerves 2-12 grossly intact. Moves all extremities   Labs reviewed: Basic Metabolic Panel:  Recent Labs  05/29/17 0851 08/15/17 1559 08/22/17 0321 08/29/17  NA  137 138 137 139  K 4.6 4.2 4.2 5.2  CL 100 103 104  --   CO2 26 25 25   --   GLUCOSE 147* 156* 174*  --   BUN 20 24* 20 22*  CREATININE 1.21* 1.29* 1.19* 0.9  CALCIUM 9.4 9.5 8.5*  --    Lab Results  Component Value Date   MICROALBUR 0.4 10/11/2016   Liver Function Tests:  Recent Labs  09/29/16 0811 05/12/17 0607  AST 8* 18  ALT 6 11*  ALKPHOS 68 66  BILITOT 0.4 0.4  PROT 7.0 6.7  ALBUMIN 3.8 3.4*    Recent Labs  05/12/17 0607  LIPASE 21   No results for input(s): AMMONIA in the last 8760 hours. CBC:  Recent Labs  09/29/16 0811  05/12/17 0607  08/15/17 1559 08/22/17 0321 08/29/17  WBC 7.1  < > 8.4  --  8.1 10.5 8.7  NEUTROABS 4,899  --  6.0  --   --   --   --   HGB 11.4*  < > 10.0*  < > 10.9* 9.5* 8.2*  HCT 36.1  < > 33.9*  < > 36.8 31.9* 27*  MCV 92.6  < > 95.5  --  95.8 95.8  --   PLT 293  < > 254  --  291 286 404*  < > = values in this interval not displayed. Lipid  Recent Labs  09/29/16 0811 05/29/17 0851  CHOL 146 210*  HDL 30* 29*  LDLCALC 69 123*  TRIG 237* 289*   Cardiac Enzymes: No results for input(s): CKTOTAL, CKMB, CKMBINDEX, TROPONINI in the last 8760 hours. BNP: No results for input(s): BNP in the last 8760 hours. CBG:  Recent Labs  08/24/17 0622 08/24/17 0839 08/24/17 1215  GLUCAP 163* 178* 165*    Procedures and Imaging Studies During Stay: Dg Knee Right Port  Result Date: 08/21/2017 CLINICAL DATA:  Status post total knee replacement EXAM: PORTABLE RIGHT  KNEE - 1-2 VIEW COMPARISON:  None. FINDINGS: Frontal and lateral views were obtained. Patient is status post total knee replacement with femoral and tibial prosthetic components well-seated. No acute fracture or dislocation. No erosive change. Air within the joint is an expected postoperative finding. IMPRESSION: Total knee replacement with femoral and tibial prosthetic components well-seated. No fracture or dislocation evident. Electronically Signed   By: Lowella Grip III M.D.   On: 08/21/2017 15:44    Assessment/Plan:   Unilateral primary osteoarthritis, right knee  Status post total right knee replacement  Acute blood loss as cause of postoperative anemia  Diabetes mellitus type 2 in obese [facility])  Essential hypertension  Benign paroxysmal positional vertigo due to bilateral vestibular disorder  Acute gouty arthritis  Vitamin D deficiency   Patient is being discharged with the following home health services:  OT/PT/nursing  Patient is being discharged with the following durable medical equipment:  None  Patient has been advised to f/u with their PCP in 1-2 weeks to bring them up to date on their rehab stay.  Social services at facility was responsible for arranging this appointment.  Pt was provided with a 30 day supply of prescriptions for medications and refills must be obtained from their PCP.  For controlled substances, a more limited supply may be provided adequate until PCP appointment only.  Medications have been reconciled.   Time spent greater than 30 minutes;> 50% of time with patient was spent reviewing records, labs, tests and studies, counseling and developing plan of care  Kaylee Delaine. Sheppard Coil, MD

## 2017-09-14 DIAGNOSIS — Z9181 History of falling: Secondary | ICD-10-CM | POA: Diagnosis not present

## 2017-09-14 DIAGNOSIS — Z471 Aftercare following joint replacement surgery: Secondary | ICD-10-CM | POA: Diagnosis not present

## 2017-09-14 DIAGNOSIS — Z79891 Long term (current) use of opiate analgesic: Secondary | ICD-10-CM | POA: Diagnosis not present

## 2017-09-14 DIAGNOSIS — Z7984 Long term (current) use of oral hypoglycemic drugs: Secondary | ICD-10-CM | POA: Diagnosis not present

## 2017-09-14 DIAGNOSIS — E559 Vitamin D deficiency, unspecified: Secondary | ICD-10-CM | POA: Diagnosis not present

## 2017-09-14 DIAGNOSIS — Z7982 Long term (current) use of aspirin: Secondary | ICD-10-CM | POA: Diagnosis not present

## 2017-09-14 DIAGNOSIS — Z96653 Presence of artificial knee joint, bilateral: Secondary | ICD-10-CM | POA: Diagnosis not present

## 2017-09-14 DIAGNOSIS — I10 Essential (primary) hypertension: Secondary | ICD-10-CM | POA: Diagnosis not present

## 2017-09-14 DIAGNOSIS — E119 Type 2 diabetes mellitus without complications: Secondary | ICD-10-CM | POA: Diagnosis not present

## 2017-09-14 DIAGNOSIS — K219 Gastro-esophageal reflux disease without esophagitis: Secondary | ICD-10-CM | POA: Diagnosis not present

## 2017-09-14 DIAGNOSIS — H9190 Unspecified hearing loss, unspecified ear: Secondary | ICD-10-CM | POA: Diagnosis not present

## 2017-09-17 DIAGNOSIS — I10 Essential (primary) hypertension: Secondary | ICD-10-CM | POA: Diagnosis not present

## 2017-09-17 DIAGNOSIS — E119 Type 2 diabetes mellitus without complications: Secondary | ICD-10-CM | POA: Diagnosis not present

## 2017-09-17 DIAGNOSIS — Z7982 Long term (current) use of aspirin: Secondary | ICD-10-CM | POA: Diagnosis not present

## 2017-09-17 DIAGNOSIS — E559 Vitamin D deficiency, unspecified: Secondary | ICD-10-CM | POA: Diagnosis not present

## 2017-09-17 DIAGNOSIS — Z79891 Long term (current) use of opiate analgesic: Secondary | ICD-10-CM | POA: Diagnosis not present

## 2017-09-17 DIAGNOSIS — Z9181 History of falling: Secondary | ICD-10-CM | POA: Diagnosis not present

## 2017-09-17 DIAGNOSIS — H9190 Unspecified hearing loss, unspecified ear: Secondary | ICD-10-CM | POA: Diagnosis not present

## 2017-09-17 DIAGNOSIS — Z7984 Long term (current) use of oral hypoglycemic drugs: Secondary | ICD-10-CM | POA: Diagnosis not present

## 2017-09-17 DIAGNOSIS — K219 Gastro-esophageal reflux disease without esophagitis: Secondary | ICD-10-CM | POA: Diagnosis not present

## 2017-09-17 DIAGNOSIS — Z471 Aftercare following joint replacement surgery: Secondary | ICD-10-CM | POA: Diagnosis not present

## 2017-09-17 DIAGNOSIS — Z96653 Presence of artificial knee joint, bilateral: Secondary | ICD-10-CM | POA: Diagnosis not present

## 2017-09-19 ENCOUNTER — Other Ambulatory Visit (INDEPENDENT_AMBULATORY_CARE_PROVIDER_SITE_OTHER): Payer: Self-pay

## 2017-09-19 DIAGNOSIS — H9190 Unspecified hearing loss, unspecified ear: Secondary | ICD-10-CM | POA: Diagnosis not present

## 2017-09-19 DIAGNOSIS — Z96653 Presence of artificial knee joint, bilateral: Secondary | ICD-10-CM | POA: Diagnosis not present

## 2017-09-19 DIAGNOSIS — Z7982 Long term (current) use of aspirin: Secondary | ICD-10-CM | POA: Diagnosis not present

## 2017-09-19 DIAGNOSIS — Z9181 History of falling: Secondary | ICD-10-CM | POA: Diagnosis not present

## 2017-09-19 DIAGNOSIS — Z7984 Long term (current) use of oral hypoglycemic drugs: Secondary | ICD-10-CM | POA: Diagnosis not present

## 2017-09-19 DIAGNOSIS — E119 Type 2 diabetes mellitus without complications: Secondary | ICD-10-CM | POA: Diagnosis not present

## 2017-09-19 DIAGNOSIS — I10 Essential (primary) hypertension: Secondary | ICD-10-CM | POA: Diagnosis not present

## 2017-09-19 DIAGNOSIS — E559 Vitamin D deficiency, unspecified: Secondary | ICD-10-CM | POA: Diagnosis not present

## 2017-09-19 DIAGNOSIS — Z471 Aftercare following joint replacement surgery: Secondary | ICD-10-CM | POA: Diagnosis not present

## 2017-09-19 DIAGNOSIS — K219 Gastro-esophageal reflux disease without esophagitis: Secondary | ICD-10-CM | POA: Diagnosis not present

## 2017-09-19 DIAGNOSIS — Z79891 Long term (current) use of opiate analgesic: Secondary | ICD-10-CM | POA: Diagnosis not present

## 2017-09-19 MED ORDER — METHOCARBAMOL 750 MG PO TABS: 750.0000 mg | ORAL_TABLET | Freq: Three times a day (TID) | ORAL | 0 refills | Status: DC | PRN

## 2017-09-20 ENCOUNTER — Inpatient Hospital Stay: Payer: Medicare Other | Admitting: Family Medicine

## 2017-09-20 ENCOUNTER — Other Ambulatory Visit (INDEPENDENT_AMBULATORY_CARE_PROVIDER_SITE_OTHER): Payer: Self-pay

## 2017-09-20 MED ORDER — TIZANIDINE HCL 4 MG PO TABS: 4.0000 mg | ORAL_TABLET | Freq: Four times a day (QID) | ORAL | 0 refills | Status: DC | PRN

## 2017-09-21 ENCOUNTER — Telehealth (INDEPENDENT_AMBULATORY_CARE_PROVIDER_SITE_OTHER): Payer: Self-pay | Admitting: Orthopaedic Surgery

## 2017-09-21 MED ORDER — METHOCARBAMOL 750 MG PO TABS: 750.0000 mg | ORAL_TABLET | Freq: Three times a day (TID) | ORAL | 0 refills | Status: DC | PRN

## 2017-09-21 NOTE — Telephone Encounter (Signed)
Patients insurance will not pay for robaxin or zanaflex. Wanting to know if there is a different muscle relaxer she can have. She is status post right total knee arthroplasty. Surgery on 08/21/17 with Dr. Ninfa Linden, she is about 4 weeks post op. Can Dr.Yates advise?

## 2017-09-21 NOTE — Telephone Encounter (Signed)
Please see below. Can you advise since Dr. Ninfa Linden is out of the office? Thanks.

## 2017-09-21 NOTE — Telephone Encounter (Signed)
Patient called back concerning her Rx. Advised message was forwarded to another provider for assistance since Dr Ninfa Linden is out of the office in surgery. The number to contact patient [phone]

## 2017-09-21 NOTE — Telephone Encounter (Signed)
I spoke to patient and she advised pharmacy did not have Robaxin 500mg  but her insurance will cover the Robaxin 750mg . Verbal order from Dr. Lorin Mercy for that to be filled. Sent to pharmacy. Patient aware.

## 2017-09-21 NOTE — Telephone Encounter (Signed)
SEE IF they will cover skelaxin. If not then baclofen.

## 2017-09-21 NOTE — Telephone Encounter (Signed)
Patient called advised her insurance company will not pay for the muscle relaxer that was called in. Patient asked if there is another medication Dr Ninfa Linden can prescribe for her. The number to contact patient is [phone]

## 2017-09-24 DIAGNOSIS — E119 Type 2 diabetes mellitus without complications: Secondary | ICD-10-CM | POA: Diagnosis not present

## 2017-09-24 DIAGNOSIS — Z96653 Presence of artificial knee joint, bilateral: Secondary | ICD-10-CM | POA: Diagnosis not present

## 2017-09-24 DIAGNOSIS — E559 Vitamin D deficiency, unspecified: Secondary | ICD-10-CM | POA: Diagnosis not present

## 2017-09-24 DIAGNOSIS — Z7984 Long term (current) use of oral hypoglycemic drugs: Secondary | ICD-10-CM | POA: Diagnosis not present

## 2017-09-24 DIAGNOSIS — Z471 Aftercare following joint replacement surgery: Secondary | ICD-10-CM | POA: Diagnosis not present

## 2017-09-24 DIAGNOSIS — Z7982 Long term (current) use of aspirin: Secondary | ICD-10-CM | POA: Diagnosis not present

## 2017-09-24 DIAGNOSIS — Z79891 Long term (current) use of opiate analgesic: Secondary | ICD-10-CM | POA: Diagnosis not present

## 2017-09-24 DIAGNOSIS — Z9181 History of falling: Secondary | ICD-10-CM | POA: Diagnosis not present

## 2017-09-24 DIAGNOSIS — H9190 Unspecified hearing loss, unspecified ear: Secondary | ICD-10-CM | POA: Diagnosis not present

## 2017-09-24 DIAGNOSIS — K219 Gastro-esophageal reflux disease without esophagitis: Secondary | ICD-10-CM | POA: Diagnosis not present

## 2017-09-24 DIAGNOSIS — I10 Essential (primary) hypertension: Secondary | ICD-10-CM | POA: Diagnosis not present

## 2017-10-03 ENCOUNTER — Ambulatory Visit (INDEPENDENT_AMBULATORY_CARE_PROVIDER_SITE_OTHER): Payer: Medicare Other | Admitting: Family Medicine

## 2017-10-03 ENCOUNTER — Ambulatory Visit (INDEPENDENT_AMBULATORY_CARE_PROVIDER_SITE_OTHER): Payer: Medicare Other | Admitting: Orthopaedic Surgery

## 2017-10-03 ENCOUNTER — Encounter: Payer: Self-pay | Admitting: Family Medicine

## 2017-10-03 VITALS — BP 120/70 | HR 73 | Wt 312.6 lb

## 2017-10-03 DIAGNOSIS — Z23 Encounter for immunization: Secondary | ICD-10-CM

## 2017-10-03 DIAGNOSIS — I10 Essential (primary) hypertension: Secondary | ICD-10-CM

## 2017-10-03 DIAGNOSIS — E1169 Type 2 diabetes mellitus with other specified complication: Secondary | ICD-10-CM

## 2017-10-03 DIAGNOSIS — E781 Pure hyperglyceridemia: Secondary | ICD-10-CM

## 2017-10-03 DIAGNOSIS — Z96651 Presence of right artificial knee joint: Secondary | ICD-10-CM

## 2017-10-03 DIAGNOSIS — Z96652 Presence of left artificial knee joint: Secondary | ICD-10-CM

## 2017-10-03 DIAGNOSIS — R0982 Postnasal drip: Secondary | ICD-10-CM

## 2017-10-03 DIAGNOSIS — E669 Obesity, unspecified: Secondary | ICD-10-CM | POA: Diagnosis not present

## 2017-10-03 LAB — POCT GLYCOSYLATED HEMOGLOBIN (HGB A1C): Hemoglobin A1C: 6.7

## 2017-10-03 MED ORDER — PITAVASTATIN CALCIUM 4 MG PO TABS: 1.0000 | ORAL_TABLET | Freq: Every day | ORAL | 0 refills | Status: DC

## 2017-10-03 NOTE — Patient Instructions (Addendum)
Try Claritin once daily and see if this helps with the drainage. Let me know if this is not helping.    Start checking your blood sugars 2-3 times per week. Keep a record of these. Your hemoglobin A1c is 6.7% today and in goal range.   Return Friday for labs.   We will call you with your results. Follow up in 4 months for diabetes or sooner if needed.

## 2017-10-03 NOTE — Progress Notes (Signed)
Subjective:    Patient ID: [name], female    DOB: [birth year], 66 y.o.   MRN: [id number]  [name] is a 66 y.o. female who presents for follow-up of Type 2 diabetes mellitus and other chronic health conditions.   She was discharged from [facility] rehab 2 weeks ago. She had a right total knee replacement done by Dr. Rush Farmer. She has an appt today for f/u.  States she is feeling good.   Loss of taste since sinus surgery 2010. States it is getting worse.  States she thinks it may be related to post nasal drainage that seems to be getting wore.  GERD- takes ranitadine.   Patient is not checking home blood sugars.   Home blood sugar records: patient does not check sugars How often is blood sugars being checked: supposed to be checking once a day but not checking Current symptoms include: none. Patient denies nausea, visual disturbances, vomiting and weight loss.  Patient is checking their feet daily. Any Foot concerns (callous, ulcer, wound, thickened nails, toenail fungus, skin fungus, hammer toe): none but tingling in feet at night when laying down Last dilated eye exam: This year with Dr. Einar Gip  Current treatments: Metformin and glimepride  Medication compliance: good  Current diet: well balanced, having trouble tasting food Current exercise: walking but limited. Walking with a rolling walker today due to recent knee replacement.  Known diabetic complications: none  The following portions of the patient's history were reviewed and updated as appropriate: allergies, current medications, past medical history, past social history and problem list.  ROS as in subjective above.     Objective:    Physical Exam Alert and in no distress otherwise not examined.  Blood pressure 120/70, pulse 73, weight (!) 312 lb 9.6 oz (141.8 kg).  Lab Review Diabetic Labs Latest Ref Rng & Units 10/03/2017 08/29/2017 08/22/2017 08/15/2017 05/29/2017  HbA1c - 6.7% - - 6.6(H) 7.0    Microalbumin Not estab mg/dL - - - - -  Micro/Creat Ratio <30 mcg/mg creat - - - - -  Chol <200 mg/dL - - - - 210(H)  HDL >50 mg/dL - - - - 29(L)  Calc LDL <100 mg/dL - - - - 123(H)  Triglycerides <150 mg/dL - - - - 289(H)  Creatinine 0.5 - 1.1 - 0.9 1.19(H) 1.29(H) 1.21(H)   BP/Weight 10/03/2017 09/11/2017 08/27/2017 08/24/2017 0/86/7619  Systolic BP 509 326 712 458 099  Diastolic BP 70 60 60 55 53  Wt. (Lbs) 312.6 315 320 - 312  BMI 50.45 50.84 51.65 - 50.36   Foot/eye exam completion dates Latest Ref Rng & Units 07/18/2017 05/29/2017  Eye Exam No Retinopathy No Retinopathy -  Foot Form Completion - - Done    [name]  reports that she has never smoked. She has never used smokeless tobacco. She reports that she does not drink alcohol or use drugs.     Assessment & Plan:    Diabetes mellitus type 2 in obese ([place]) - Plan: HgB A1c, CBC with Differential/Platelet, COMPLETE METABOLIC PANEL WITH GFR, Microalbumin / creatinine urine ratio, Lipid panel  Needs flu shot - Plan: Flu Vaccine QUAD 36+ mos IM  Essential hypertension - Plan: CBC with Differential/Platelet, COMPLETE METABOLIC PANEL WITH GFR  Post-nasal drainage  Hypertriglyceridemia - Plan: Lipid panel  1. Rx changes: none 2. Education: Reviewed 'ABCs' of diabetes management (respective goals in parentheses):  A1C (<7), blood pressure (<130/80), and cholesterol (LDL <100). 3. Compliance at present is estimated  to be good. Efforts to improve compliance (if necessary) will be directed at dietary modifications: she is eating sweets, increased exercise and regular blood sugar monitoring: 2-3  times weekly. 4. BP is at goal. Continue current medications.  5. Will check fasting labs. She is tolerating Livalo.  6. Will have her try Claritin for post nasal drainage and follow up if not helping  7. Urine microalbumin done 8. Foot exam done 9. Follow up: 4 months

## 2017-10-03 NOTE — Progress Notes (Signed)
The patient is now 6 weeks status post a right total knee arthroplasty and 8 months status post left total knee arthroplasty. She says her knees are doing great and she is already let the therapist go she states.  On exam she is walking with a walker. She is morbidly obese. Both knees have excellent range of motion. They both feel stable to me as well.  At this point she'll continue increase her activities as he tolerates. We'll see her back in 4 months and I have an AP and lateral both knees at that visit.

## 2017-10-04 LAB — MICROALBUMIN / CREATININE URINE RATIO
Creatinine, Urine: 109 mg/dL (ref 20–275)
Microalb Creat Ratio: 8 mcg/mg creat (ref <30)
Microalb, Ur: 0.9 mg/dL

## 2017-10-05 ENCOUNTER — Other Ambulatory Visit: Payer: Medicare Other

## 2017-10-05 DIAGNOSIS — I10 Essential (primary) hypertension: Secondary | ICD-10-CM | POA: Diagnosis not present

## 2017-10-05 DIAGNOSIS — E781 Pure hyperglyceridemia: Secondary | ICD-10-CM

## 2017-10-05 DIAGNOSIS — E1169 Type 2 diabetes mellitus with other specified complication: Secondary | ICD-10-CM | POA: Diagnosis not present

## 2017-10-05 DIAGNOSIS — E669 Obesity, unspecified: Principal | ICD-10-CM

## 2017-10-05 LAB — CBC WITH DIFFERENTIAL/PLATELET
Basophils Absolute: 31 cells/uL (ref 0–200)
Basophils Relative: 0.5 %
Eosinophils Absolute: 93 cells/uL (ref 15–500)
Eosinophils Relative: 1.5 %
HCT: 31.6 % — ABNORMAL LOW (ref 35.0–45.0)
Hemoglobin: 10.2 g/dL — ABNORMAL LOW (ref 11.7–15.5)
Lymphs Abs: 1203 cells/uL (ref 850–3900)
MCH: 29 pg (ref 27.0–33.0)
MCHC: 32.3 g/dL (ref 32.0–36.0)
MCV: 89.8 fL (ref 80.0–100.0)
MPV: 11.1 fL (ref 7.5–12.5)
Monocytes Relative: 7.3 %
Neutro Abs: 4421 cells/uL (ref 1500–7800)
Neutrophils Relative %: 71.3 %
Platelets: 334 10*3/uL (ref 140–400)
RBC: 3.52 10*6/uL — ABNORMAL LOW (ref 3.80–5.10)
RDW: 13.3 % (ref 11.0–15.0)
Total Lymphocyte: 19.4 %
WBC mixed population: 453 cells/uL (ref 200–950)
WBC: 6.2 10*3/uL (ref 3.8–10.8)

## 2017-10-05 LAB — COMPLETE METABOLIC PANEL WITH GFR
AG Ratio: 1.2 (calc) (ref 1.0–2.5)
ALT: 6 U/L (ref 6–29)
AST: 8 U/L — ABNORMAL LOW (ref 10–35)
Albumin: 4 g/dL (ref 3.6–5.1)
Alkaline phosphatase (APISO): 72 U/L (ref 33–130)
BUN/Creatinine Ratio: 16 (calc) (ref 6–22)
BUN: 21 mg/dL (ref 7–25)
CO2: 23 mmol/L (ref 20–32)
Calcium: 9.5 mg/dL (ref 8.6–10.4)
Chloride: 106 mmol/L (ref 98–110)
Creat: 1.3 mg/dL — ABNORMAL HIGH (ref 0.50–0.99)
GFR, Est African American: 50 mL/min/{1.73_m2} — ABNORMAL LOW (ref >=60)
GFR, Est Non African American: 43 mL/min/{1.73_m2} — ABNORMAL LOW (ref >=60)
Globulin: 3.3 g/dL (calc) (ref 1.9–3.7)
Glucose, Bld: 164 mg/dL — ABNORMAL HIGH (ref 65–99)
Potassium: 4.3 mmol/L (ref 3.5–5.3)
Sodium: 141 mmol/L (ref 135–146)
Total Bilirubin: 0.3 mg/dL (ref 0.2–1.2)
Total Protein: 7.3 g/dL (ref 6.1–8.1)

## 2017-10-05 LAB — LIPID PANEL
Cholesterol: 150 mg/dL (ref <200)
HDL: 34 mg/dL — ABNORMAL LOW (ref >50)
LDL Cholesterol (Calc): 91 mg/dL (calc)
Non-HDL Cholesterol (Calc): 116 mg/dL (calc) (ref <130)
Total CHOL/HDL Ratio: 4.4 (calc) (ref <5.0)
Triglycerides: 146 mg/dL (ref <150)

## 2017-11-12 ENCOUNTER — Telehealth: Payer: Self-pay | Admitting: Family Medicine

## 2017-11-12 NOTE — Telephone Encounter (Signed)
  Patient called about losartan recall and I advised her to contact her pharmacy because the recall is based on lot # and dates. Patient will contact pharmacy

## 2017-12-06 ENCOUNTER — Other Ambulatory Visit: Payer: Self-pay | Admitting: Family Medicine

## 2017-12-18 ENCOUNTER — Other Ambulatory Visit: Payer: Self-pay | Admitting: Family Medicine

## 2018-01-30 ENCOUNTER — Ambulatory Visit: Payer: Medicare Other | Admitting: Family Medicine

## 2018-02-04 ENCOUNTER — Encounter (INDEPENDENT_AMBULATORY_CARE_PROVIDER_SITE_OTHER): Payer: Self-pay | Admitting: Orthopaedic Surgery

## 2018-02-04 ENCOUNTER — Ambulatory Visit (INDEPENDENT_AMBULATORY_CARE_PROVIDER_SITE_OTHER): Payer: Medicare Other

## 2018-02-04 ENCOUNTER — Ambulatory Visit (INDEPENDENT_AMBULATORY_CARE_PROVIDER_SITE_OTHER): Payer: Medicare Other | Admitting: Orthopaedic Surgery

## 2018-02-04 DIAGNOSIS — Z96653 Presence of artificial knee joint, bilateral: Secondary | ICD-10-CM

## 2018-02-04 NOTE — Progress Notes (Signed)
The patient is a year out from a left total knee arthroplasty in 6 months out from a right total knee arthroplasty.  She has no complaints is doing well.  She does end up with a cane she is a morbidly obese individual.  She says both knees are not giving her any issues or problems at all.  On examination both knees have no effusion.  Both knees have full range of motion and are ligamentously stable.  Both knees have well-healed surgical incisions.  An AP and lateral of each knee are obtained and show total knee arthroplasties with no acute findings and no evidence of loosening.  At this point follow-up as needed.  I talked about things that need to bring her back for her knees and if she has any issues she will let us know.  All questions and concerns were answered and addressed.

## 2018-02-27 ENCOUNTER — Ambulatory Visit: Payer: Medicare Other | Admitting: Family Medicine

## 2018-03-14 ENCOUNTER — Other Ambulatory Visit: Payer: Self-pay | Admitting: Family Medicine

## 2018-03-14 NOTE — Telephone Encounter (Signed)
Left detailed message for pt to call back and schedule appt and then once scheduled we can refill med

## 2018-03-14 NOTE — Telephone Encounter (Signed)
Pt returned call stating that she has #90 on the meds mentioned on the refill request right now and since she is in the process of moving she will call back soon to schedule an appointment before she runs out of meds

## 2018-03-20 ENCOUNTER — Telehealth: Payer: Self-pay | Admitting: Internal Medicine

## 2018-03-20 NOTE — Telephone Encounter (Signed)
Pt was notified that her bp med was recalled, she states that they sent  Out another lot number to her about 2 weeks ago. Pt was notified that she needed an appt but she declined and will call back after she gets done moving

## 2018-04-22 ENCOUNTER — Other Ambulatory Visit: Payer: Self-pay | Admitting: Family Medicine

## 2018-04-22 NOTE — Telephone Encounter (Signed)
Pt was notified that she needed an appt. . She has enough meds for the rest of the month.

## 2018-05-02 NOTE — Progress Notes (Signed)
Subjective:    Patient ID: [name], female    DOB: Oct 18, 1951, 67 y.o.   MRN: [id number]  [name] is a 68 y.o. female who presents for follow-up of Type 2 diabetes mellitus.  Taking livalo every other day. Having cramping in her hands intermittently and thinks this is related to the medication.  Reports having nasal congestion and drainage related to allergies.  She also notes postnasal drainage.  She started taking Claritin and this is helped somewhat.  Continues having decreased hearing in the left ear.  Denies headache, sinus pain, purulent nasal discharge, sore throat, cough.  Patient is checking home blood sugars once weekly  Home blood sugar records: 130s fasting. This morning FBS was 143 How often is blood sugars being checked: once a week when pt feels bad Current symptoms include: paresthesia of the feet. Patient denies hyperglycemia, hypoglycemia , nausea, polydipsia, polyuria, visual disturbances and vomiting.  Patient is checking their feet daily. Any Foot concerns (callous, ulcer, wound, thickened nails, toenail fungus, skin fungus, hammer toe): no Last dilated eye exam: 2018.  No retinopathy.  Current treatments: Metformin and glimepride. Medication compliance: all except statin causing cramps Current diet: unhealthy diet and likes sweets.  Current exercise:some but not as much since knee replacement Known diabetic complications: None.  She has been screened for peripheral vascular disease.  The following portions of the patient's history were reviewed and updated as appropriate: allergies, current medications, past medical history, past social history and problem list.  ROS as in subjective above.     Objective:    Physical Exam Alert and in no distress. No sinus tenderness. Nose with nasal edema, erythema L>R. Tympanic membranes and canals are normal. Pharyngeal area is normal. Neck is supple without adenopathy or thyromegaly. Cardiac exam shows a regular  sinus rhythm without murmurs or gallops. Lungs are clear to auscultation. Extremities without edema, normal pulses, sensation and motor function.    Blood pressure 126/74, pulse 85, temperature 97.9 F (36.6 C), height 5\' 7"  (1.702 m), weight (!) 316 lb 12.8 oz (143.7 kg), SpO2 98 %.  Lab Review Diabetic Labs Latest Ref Rng & Units 05/03/2018 10/05/2017 10/03/2017 08/29/2017 08/22/2017  HbA1c - 7.0 - 6.7% - -  Microalbumin mg/dL - - 0.9 - -  Micro/Creat Ratio <30 mcg/mg creat - - 8 - -  Chol <200 mg/dL - 150 - - -  HDL >50 mg/dL - 34(L) - - -  Calc LDL mg/dL (calc) - 91 - - -  Triglycerides <150 mg/dL - 146 - - -  Creatinine 0.50 - 0.99 mg/dL - 1.30(H) - 0.9 1.19(H)   BP/Weight 05/03/2018 10/03/2017 09/11/2017 8/88/2800 02/18/9178  Systolic BP 150 569 794 801 655  Diastolic BP 74 70 60 60 55  Wt. (Lbs) 316.8 312.6 315 320 -  BMI 49.62 50.45 50.84 51.65 -   Foot/eye exam completion dates Latest Ref Rng & Units 07/18/2017 05/29/2017  Eye Exam No Retinopathy No Retinopathy -  Foot Form Completion - - Done    [name]  reports that she has never smoked. She has never used smokeless tobacco. She reports that she does not drink alcohol or use drugs.     Assessment & Plan:    Diabetes mellitus type 2 in obese ([place]) - Plan: CBC with Differential/Platelet, Comprehensive metabolic panel, POCT glycosylated hemoglobin (Hb A1C)  Hyperlipidemia associated with type 2 diabetes mellitus ([place]) - Plan: Lipid panel  Medication management - Plan: Lipid panel  OBESITY, MORBID  Allergic  rhinitis, unspecified seasonality, unspecified trigger  Essential hypertension  1. Rx changes: none hemoglobin A1c with a slight bump from 6.7% to 7% today.  Continue on metformin and glimepiride. 2. Education: Reviewed 'ABCs' of diabetes management (respective goals in parentheses):  A1C (<7), blood pressure (<130/80), and cholesterol (LDL <100). 3. Compliance at present is estimated to be fair. Efforts to improve  compliance (if necessary) will be directed at dietary modifications: Encouraged her to cut back on the amount of chocolate and sweets she is having, increased exercise and regular blood sugar monitoring: 2-3 times times weekly. 4. Patient recently had a recall on losartan/HCTZ and patient reports taking care of this with her pharmacy last week. 5. Reviewed vascular studies from July 2018 with patient.  She had a normal study then.  No worsening leg or foot pain. 6. She believes she is having intermittent hand cramping since taking the Livalo.  She has cut back to every other day but feels like if she stops the medication her cramps will go away.  She has failed atorvastatin and simvastatin previously due to muscle aches and cramps.  Recheck fasting lipids today. 7. Follow up: 5 months for her Medicare annual wellness visit which is overdue along with diabetes and follow-up on chronic health conditions.

## 2018-05-03 ENCOUNTER — Encounter: Payer: Self-pay | Admitting: Family Medicine

## 2018-05-03 ENCOUNTER — Ambulatory Visit (INDEPENDENT_AMBULATORY_CARE_PROVIDER_SITE_OTHER): Payer: Medicare Other | Admitting: Family Medicine

## 2018-05-03 VITALS — BP 126/74 | HR 85 | Temp 97.9°F | Ht 67.0 in | Wt 316.8 lb

## 2018-05-03 DIAGNOSIS — Z79899 Other long term (current) drug therapy: Secondary | ICD-10-CM | POA: Diagnosis not present

## 2018-05-03 DIAGNOSIS — E1169 Type 2 diabetes mellitus with other specified complication: Secondary | ICD-10-CM

## 2018-05-03 DIAGNOSIS — E785 Hyperlipidemia, unspecified: Secondary | ICD-10-CM

## 2018-05-03 DIAGNOSIS — E669 Obesity, unspecified: Secondary | ICD-10-CM | POA: Diagnosis not present

## 2018-05-03 DIAGNOSIS — I10 Essential (primary) hypertension: Secondary | ICD-10-CM | POA: Diagnosis not present

## 2018-05-03 DIAGNOSIS — J309 Allergic rhinitis, unspecified: Secondary | ICD-10-CM

## 2018-05-03 LAB — POCT GLYCOSYLATED HEMOGLOBIN (HGB A1C): Hemoglobin A1C: 7

## 2018-05-03 MED ORDER — PITAVASTATIN CALCIUM 4 MG PO TABS: 1.0000 | ORAL_TABLET | Freq: Every day | ORAL | 1 refills | Status: DC

## 2018-05-03 MED ORDER — METFORMIN HCL 1000 MG PO TABS: ORAL_TABLET | ORAL | 1 refills | Status: DC

## 2018-05-03 MED ORDER — LOSARTAN POTASSIUM-HCTZ 100-12.5 MG PO TABS: 1.0000 | ORAL_TABLET | Freq: Every day | ORAL | 1 refills | Status: DC

## 2018-05-03 MED ORDER — RANITIDINE HCL 150 MG PO TABS: 150.0000 mg | ORAL_TABLET | Freq: Two times a day (BID) | ORAL | 1 refills | Status: DC

## 2018-05-03 MED ORDER — GLIMEPIRIDE 4 MG PO TABS: ORAL_TABLET | ORAL | 1 refills | Status: DC

## 2018-05-03 NOTE — Addendum Note (Signed)
Addended by: Minette Headland A on: 05/03/2018 12:11 PM   Modules accepted: Orders

## 2018-05-03 NOTE — Patient Instructions (Signed)
Your hemoglobin A1c is 7.0%.   Cut back on sweets and increase your physical activity.   Try using Flonase or Nasocort for your nasal congestion. Continue on Claritin.   We will call with lab results.   I will see you back in October for annual wellness and diabetes.

## 2018-05-04 LAB — COMPREHENSIVE METABOLIC PANEL
ALT: 10 IU/L (ref 0–32)
AST: 16 IU/L (ref 0–40)
Albumin/Globulin Ratio: 1.4 (ref 1.2–2.2)
Albumin: 4.2 g/dL (ref 3.6–4.8)
Alkaline Phosphatase: 80 IU/L (ref 39–117)
BUN/Creatinine Ratio: 16 (ref 12–28)
BUN: 20 mg/dL (ref 8–27)
Bilirubin Total: <0.2 mg/dL (ref 0.0–1.2)
CO2: 19 mmol/L — ABNORMAL LOW (ref 20–29)
Calcium: 9.5 mg/dL (ref 8.7–10.3)
Chloride: 104 mmol/L (ref 96–106)
Creatinine, Ser: 1.23 mg/dL — ABNORMAL HIGH (ref 0.57–1.00)
GFR calc Af Amer: 54 mL/min/{1.73_m2} — ABNORMAL LOW (ref >59)
GFR calc non Af Amer: 46 mL/min/{1.73_m2} — ABNORMAL LOW (ref >59)
Globulin, Total: 3.1 g/dL (ref 1.5–4.5)
Glucose: 121 mg/dL — ABNORMAL HIGH (ref 65–99)
Potassium: 4.7 mmol/L (ref 3.5–5.2)
Sodium: 144 mmol/L (ref 134–144)
Total Protein: 7.3 g/dL (ref 6.0–8.5)

## 2018-05-04 LAB — CBC WITH DIFFERENTIAL/PLATELET
Basophils Absolute: 0 10*3/uL (ref 0.0–0.2)
Basos: 0 %
EOS (ABSOLUTE): 0.1 10*3/uL (ref 0.0–0.4)
Eos: 2 %
Hematocrit: 34.9 % (ref 34.0–46.6)
Hemoglobin: 11.5 g/dL (ref 11.1–15.9)
Immature Grans (Abs): 0 10*3/uL (ref 0.0–0.1)
Immature Granulocytes: 0 %
Lymphocytes Absolute: 1.4 10*3/uL (ref 0.7–3.1)
Lymphs: 20 %
MCH: 29.5 pg (ref 26.6–33.0)
MCHC: 33 g/dL (ref 31.5–35.7)
MCV: 90 fL (ref 79–97)
Monocytes Absolute: 0.3 10*3/uL (ref 0.1–0.9)
Monocytes: 5 %
Neutrophils Absolute: 4.9 10*3/uL (ref 1.4–7.0)
Neutrophils: 73 %
Platelets: 289 10*3/uL (ref 150–379)
RBC: 3.9 x10E6/uL (ref 3.77–5.28)
RDW: 14.7 % (ref 12.3–15.4)
WBC: 6.7 10*3/uL (ref 3.4–10.8)

## 2018-05-04 LAB — LIPID PANEL
Chol/HDL Ratio: 4.4 ratio (ref 0.0–4.4)
Cholesterol, Total: 176 mg/dL (ref 100–199)
HDL: 40 mg/dL (ref >39)
LDL Calculated: 104 mg/dL — ABNORMAL HIGH (ref 0–99)
Triglycerides: 161 mg/dL — ABNORMAL HIGH (ref 0–149)
VLDL Cholesterol Cal: 32 mg/dL (ref 5–40)

## 2018-06-26 ENCOUNTER — Other Ambulatory Visit: Payer: Self-pay | Admitting: Nurse Practitioner

## 2018-09-10 ENCOUNTER — Other Ambulatory Visit: Payer: Self-pay | Admitting: Family Medicine

## 2018-09-24 ENCOUNTER — Other Ambulatory Visit: Payer: Self-pay

## 2018-09-24 NOTE — Patient Outreach (Signed)
[facility]) Care Management  09/24/2018  Oviya Ammar Gutterman [birth year] [phone]   Medication Adherence call to Mrs. Brylea Scale left a message for patient to call back patient is due on Losartan/ HCTZ 100/12.5 mg under [place].   [place] Management Direct Dial [phone]  Fax [phone] [email or web address]

## 2018-10-25 ENCOUNTER — Other Ambulatory Visit: Payer: Self-pay | Admitting: Family Medicine

## 2018-10-25 NOTE — Telephone Encounter (Signed)
Pt is being seen Monday. Will wait to refill it then. Has she soul not be out

## 2018-10-27 NOTE — Progress Notes (Signed)
[name] is a 67 y.o. female who presents for annual wellness visit and follow-up on chronic medical conditions.  She has the following concerns:  History of muscle cramps and continues to complain of intermittent hand cramping as well as leg cramping.  This is occurring during the day mostly. She thinks the Livalo could be causing her to have muscle cramps. Takes this every couple of days.   She is not fasting.   Needs hepatitis C screen.   Diabetes- Hgb A1c 7.0% in May 2019 On Metformin and glimepride.  Reports good daily compliance with medications and no side effects. States her diet has been poor and she has been eating more sweets than usual.  She does not check her blood sugars very often.  States today her fasting blood sugar was 179. She has not been exercising. Overdue for eye exam.   States she has gained 20 lbs because of inactivity and diet high in sweets. She would like help with weight loss.   Takes Excedrin PM and Tramadol for back and ankle pain.   GERD- taking PPI almost daily.     Immunization History  Administered Date(s) Administered  . Influenza,inj,Quad PF,6+ Mos 09/21/2016, 10/03/2017  . PPD Test 02/09/2017  . Pneumococcal Polysaccharide-23 08/01/2006  . Td 12/21/2003   Last Pap smear: 10/11/2016 Last mammogram:10/03/2016 Last colonoscopy: 08/2003. Refuses. Agrees to do Cologuard.  Last DEXA: never  Dentist: over one year Ophtho:one year Exercise: walk   Other doctors caring for patient include: Dr. Ninfa Linden - orthopedic surgeon.    Depression screen:  See questionnaire below.  Depression screen [facility] 2/9 10/28/2018 10/03/2017 09/21/2016 09/22/2015 08/19/2015  Decreased Interest 0 0 0 0 0  Down, Depressed, Hopeless 0 0 0 0 0  PHQ - 2 Score 0 0 0 0 0    Fall Risk Screen: see questionnaire below. Fall Risk  10/28/2018 10/03/2017 09/21/2016 09/22/2015 08/19/2015  Falls in the past year? 0 No No No No    ADL screen:  See questionnaire  below Functional Status Survey: Is the patient deaf or have difficulty hearing?: Yes(just left ear) Does the patient have difficulty seeing, even when wearing glasses/contacts?: No Does the patient have difficulty concentrating, remembering, or making decisions?: No Does the patient have difficulty walking or climbing stairs?: No Does the patient have difficulty dressing or bathing?: No Does the patient have difficulty doing errands alone such as visiting a doctor's office or shopping?: No   End of Life Discussion:  Patient does not have a living will and medical power of attorney. MOST form filled out today with patient. Other forms given for her to take home.   Review of Systems Constitutional: -fever, -chills, -sweats, -unexpected weight change, -anorexia, -fatigue Allergy: -sneezing, -itching, -congestion Dermatology: denies changing moles, rash, lumps, new worrisome lesions ENT: -runny nose, -ear pain, -sore throat, -hoarseness, -sinus pain, -teeth pain, -tinnitus, -hearing loss, -epistaxis Cardiology:  -chest pain, -palpitations, -edema, -orthopnea, -paroxysmal nocturnal dyspnea Respiratory: -cough, -shortness of breath, -dyspnea on exertion, -wheezing, -hemoptysis Gastroenterology: -abdominal pain, -nausea, -vomiting, -diarrhea, -constipation, -blood in stool, -changes in bowel movement, -dysphagia Hematology: -bleeding or bruising problems Musculoskeletal: -arthralgias, -myalgias, -joint swelling, +back pain, -neck pain, +cramping, -gait changes Ophthalmology: -vision changes, -eye redness, -itching, -discharge Urology: -dysuria, -difficulty urinating, -hematuria, -urinary frequency, -urgency, incontinence Neurology: -headache, -weakness, -tingling, -numbness, -speech abnormality, -memory loss, -falls, -dizziness Psychology:  -depressed mood, -agitation, -sleep problems    PHYSICAL EXAM:  BP 132/80 (BP Location: Left Arm, Patient Position: Sitting)  Pulse 61   Temp 97.7 F  (36.5 C)   Ht 5\' 7"  (1.702 m)   Wt (!) 332 lb 9.6 oz (150.9 kg)   LMP  (LMP Unknown)   SpO2 93%   BMI 52.09 kg/m   General Appearance: Alert, cooperative, no distress, morbidly obese, appears stated age Head: Normocephalic, without obvious abnormality, atraumatic Eyes: PERRL, conjunctiva/corneas clear, EOM's intact, fundi benign Ears: Normal TM's and external ear canals Nose: Nares normal, mucosa normal, no drainage or sinus tenderness Throat: Lips, mucosa, and tongue normal; dentures to upper and partial to lower, gums normal Neck: Supple, no lymphadenopathy; thyroid: no enlargement/tenderness/nodules; no carotid bruit or JVD Back: Spine nontender, no curvature, ROM normal, no CVA tenderness Lungs: Clear to auscultation bilaterally without wheezes, rales or ronchi; respirations unlabored Chest Wall: No tenderness or deformity Heart: Regular rate and rhythm, S1 and S2 normal, no murmur, rub or gallop Breast Exam: No tenderness, masses, or nipple discharge or inversion. No axillary lymphadenopathy Abdomen: Soft, non-tender, nondistended, normoactive bowel sounds, no palpable masses Genitalia: declines. Pap UTD Extremities: No clubbing, cyanosis or edema Pulses: 2+ and symmetric all extremities Skin: Skin color, texture, turgor normal, no rashes or lesions Lymph nodes: Cervical, supraclavicular, and axillary nodes normal Neurologic: CNII-XII intact, normal strength, sensation and gait; reflexes 2+ and symmetric throughout Psych: Normal mood, affect, hygiene and grooming.  ASSESSMENT/PLAN: Medicare annual wellness visit, subsequent  Essential hypertension - Plan: CBC with Differential/Platelet, Comprehensive metabolic panel  Gastroesophageal reflux disease without esophagitis  Diabetes mellitus type 2 in obese ([place]) - Plan: TSH, Hemoglobin A1c, Microalbumin / creatinine urine ratio, Vitamin B12  Subclinical hypothyroidism - Plan: TSH, T4, free, Magnesium, T3  Hyperlipidemia  associated with type 2 diabetes mellitus ([place]) - Plan: Lipid panel  Vitamin D deficiency - Plan: VITAMIN D 25 Hydroxy (Vit-D Deficiency, Fractures), CANCELED: VITAMIN D 25 Hydroxy (Vit-D Deficiency, Fractures)  Screening for breast cancer - Plan: MM DIGITAL SCREENING BILATERAL  Need for hepatitis C screening test - Plan: Hepatitis C antibody  Estrogen deficiency - Plan: DG Bone Density  Muscle cramps - Plan: TSH, Magnesium  Routine general medical examination at a health care facility  Morbid obesity ([place]) - Plan: TSH, Amb Ref to Medical Weight Management  Screen for colon cancer - Plan: Cologuard  She appears to be doing well overall.  No issues with ADLs, depression or falls. Hypertension-blood pressure is in goal range.  Continue on current medications. Diabetes-we will check a hemoglobin A1c and adjust medications as needed.  No hypoglycemia and denies any blood sugars greater than 200.  She does admit to a poor diet and no exercise.  Counseling done on healthy lifestyle. Morbid obesity complicating chronic health conditions.  Plan to refer to [facility] medical weight management and she is in agreement. Subclinical hypothyroidism-recheck thyroid panel Hyperlipidemia-she has felt simvastatin and atorvastatin in the past due to muscle aches and cramps.  She is currently taking pravastatin every other day and does still report intermittent cramping.  Check labs.  Discussed staying well-hydrated, taking warm baths and stretching.  She may also try tonic water. Vitamin D deficiency-recheck vitamin D level and adjust supplement as appropriate. Estrogen deficiency-she has never had a bone density.  She will call to schedule her mammogram and bone density at the breast center She refuses to repeat colonoscopy and as far as I can tell her last one was 15 years ago.  She appears to be of average risk.  She agrees to do the Cologuard test One-time hepatitis  C screening test done for her age  group. Advanced directive counseling was done.  The most form was filled out and filed today. Immunization counseling done.  Flu shot and Prevnar 13 given today.  She reports being up-to-date on Tdap and shingles vaccine however I do not have documentation of this. She will call and schedule diabetic eye exam Follow-up pending labs   Need Tdap and Shingrix documentation from [place].    Discussed monthly self breast exams and yearly mammograms; at least 30 minutes of aerobic activity at least 5 days/week and weight-bearing exercise 2x/week; proper sunscreen use reviewed; healthy diet, including goals of calcium and vitamin D intake and alcohol recommendations (less than or equal to 1 drink/day) reviewed; regular seatbelt use; changing batteries in smoke detectors.  Immunization recommendations discussed.  Colonoscopy recommendations reviewed   Medicare Attestation I have personally reviewed: The patient's medical and social history Their use of alcohol, tobacco or illicit drugs Their current medications and supplements The patient's functional ability including ADLs,fall risks, home safety risks, cognitive, and hearing and visual impairment Diet and physical activities Evidence for depression or mood disorders  The patient's weight, height, and BMI have been recorded in the chart.  I have made referrals, counseling, and provided education to the patient based on review of the above and I have provided the patient with a written personalized care plan for preventive services.     Harland Dingwall, NP-C   10/28/2018

## 2018-10-27 NOTE — Patient Instructions (Addendum)
Call and schedule your mammogram and bone density tests at the [facility].   The Cologuard will come in the mail. Please follow the instructions and return it.   Call and schedule your diabetic eye exam.   You will receive a call from the [facility] weight management clinic.   We will call you with your lab results.      GENERAL RECOMMENDATIONS FOR GOOD HEALTH:  Supplements:  . Take a daily baby Aspirin 81mg  at bedtime for heart health unless you have a history of gastrointestinal bleed, allergy to aspirin, or are already taking higher dose Aspirin or other antiplatelet or blood thinner medication.   . Consume 1200 mg of Calcium daily through dietary calcium or supplement if you are female age 52 or older, or men 49 and older.   Men aged 21-70 should consume 1000 mg of Calcium daily. . Take 600 IU of Vitamin D daily.  Take 800 IU of Calcium daily if you are older than age 15.  . Take a general multivitamin daily.   Healthy diet: Eat a variety of foods, including fruits, vegetables, vegetable protein such as beans, lentils, tofu, and grains, such as rice.  Limit meat or animal protein, but if you eat meat, choose leans cuts such as chicken, fish, or [place].  Drink plenty of water daily.  Decrease saturated fat in the diet, avoid lots of red meat, processed foods, sweets, fast foods, and fried foods.  Limit salt and caffeine intake.  Exercise: Aerobic exercise helps maintain good heart health. Weight bearing exercise helps keep bones and muscles working strong.  We recommend at least 30-40 minutes of exercise most days of the week.   Fall prevention: Falls are the leading cause of injuries, accidents, and accidental deaths in people over the age of 54. Falling is a real threat to your ability to live on your own.  Causes include poor eyesight or poor hearing, illness, poor lighting, throw rugs, clutter in your home, and medication side effects causing dizziness or balance problems.   Such medications can include medications for depression, sleep problems, high blood pressure, diabetes, and heart conditions.   PREVENTION  Be sure your home is as safe as possible. Here are some tips:  Wear shoes with non-skid soles (not house slippers).   Be sure your home and outside area are well lit.   Use night lights throughout your house, including hallways and stairways.   Remove clutter and clean up spills on floors and walkways.   Remove throw rugs or fasten them to the floor with carpet tape. Tack down carpet edges.   Do not place electrical cords across pathways.   Install grab bars in your bathtub, shower, and toilet area. Towel bars should not be used as a grab bar.   Install handrails on both sides of stairways.   Do not climb on stools or stepladders. Get someone else to help with jobs that require climbing.   Do not wax your floors at all, or use a non-skid wax.   Repair uneven or unsafe sidewalks, walkways or stairs.   Keep frequently used items within reach.   Be aware of pets so you do not trip.  Get regular check-ups from your doctor, and take good care of yourself:  Have your eyes checked every year for vision changes, cataracts, glaucoma, and other eye problems. Wear eyeglasses as directed.   Have your hearing checked every 2 years, or anytime you or others think that you  cannot hear well. Use hearing aids as directed.   See your caregiver if you have foot pain or corns. Sore feet can contribute to falls.   Let your caregiver know if a medicine is making you feel dizzy or making you lose your balance.   Use a cane, walker, or wheelchair as directed. Use walker or wheelchair brakes when getting in and out.   When you get up from bed, sit on the side of the bed for 1 to 2 minutes before you stand up. This will give your blood pressure time to adjust, and you will feel less dizzy.   If you need to go to the bathroom often, consider using a bedside  commode.  Disease prevention:  If you smoke or chew tobacco, find out from your caregiver how to quit. It can literally save your life, no matter how long you have been a tobacco user. If you do not use tobacco, never begin. Medicare does cover some smoking cessation counseling.  Maintain a healthy diet and normal weight. Increased weight leads to problems with blood pressure and diabetes. We check your height, weight, and BMI as part of your yearly visit.  The Body Mass Index or BMI is a way of measuring how much of your body is fat. Having a BMI above 27 increases the risk of heart disease, diabetes, hypertension, stroke and other problems related to obesity. Your caregiver can help determine your BMI and based on it develop an exercise and dietary program to help you achieve or maintain this important measurement at a healthful level.  High blood pressure causes heart and blood vessel problems.  Persistent high blood pressure should be treated with medicine if weight loss and exercise do not work.  We check your blood pressure as part of your yearly visit.  Avoid drinking alcohol in excess (more than two drinks per day).  Avoid use of street drugs. Do not share needles with anyone. Ask for professional help if you need assistance or instructions on stopping the use of alcohol, cigarettes, and/or drugs.  Brush your teeth twice a day with fluoride toothpaste, and floss once a day. Good oral hygiene prevents tooth decay and gum disease. The problems can be painful, unattractive, and can cause other health problems. Visit your dentist for a routine oral and dental checkup and preventive care every 6-12 months.   See your eye doctor yearly for routine screening for things like glaucoma.  Look at your skin regularly.  Use a mirror to look at your back. Notify your caregivers of changes in moles, especially if there are changes in shapes, colors, a size larger than a pencil eraser, an irregular border,  or development of new moles.  Safety:  Use seatbelts 100% of the time, whether driving or as a passenger.  Use safety devices such as hearing protection if you work in environments with loud noise or significant background noise.  Use safety glasses when doing any work that could send debris in to the eyes.  Use a helmet if you ride a bike or motorcycle.  Use appropriate safety gear for contact sports.  Talk to your caregiver about gun safety.  Use sunscreen with a SPF (or skin protection factor) of 15 or greater.  Lighter skinned people are at a greater risk of skin cancer. Don't forget to also wear sunglasses in order to protect your eyes from too much damaging sunlight. Damaging sunlight can accelerate cataract formation.   If you have multiple sexual  partners, or if you are not in a monogamous relationship, practice safe sex. Use condoms. Condoms are used to help reduce the spread of sexually transmitted infections (or STIs).  Consider an HIV test if you have never been tested.  Consider routine screening for STIs if you have multiple sexual partners.   Keep carbon monoxide and smoke detectors in your home functioning at all times. Change the batteries every 6 months or use a model that plugs into the wall or is hard wired in.   END OF LIFE PLANNING/ADVANCED DIRECTIVES Advance health-care planning is deciding the kind of care you want at the end of life. While alert competent adults are able to exercise their rights to make health care and financial decisions, problems arise when an individual becomes unconscious, incapacitated, or otherwise unable to communicate or make such decisions. Advance health care directives are the legal documents in which you give written instructions about your choices limited, aggressive or palliative care if, in the future, you cannot speak for yourself.  Advanced directives include the following: [place] allows you to appoint someone to act  as your health care agent to make health care decisions for you should it be determined by your health care provider that you are no longer able to make these decisions for yourself.  A Living Will is a legal document in which you can declare that under certain conditions you desire your life not be prolonged by extraordinary or artificial means during your last illness or when you are near death. We can provide you with sample advanced directives, you can get an attorney to prepare these for you, or you can visit [place] Secretary of State's website for additional information and resources at [email or web address]  Further, I recommend you have an attorney prepare a Will and Durable Power of Attorney if you haven't done so already.  Please get [place] a copy of your health care Advanced Directives.   PREVENTATIV E CARE RECOMMENDATIONS:  Vaccinations: We recommend the following vaccinations as part of your preventative care:  Pneumococcal vaccine is recommended to protect against certain types of pneumonia.  This is normally recommended for adults age 50 or older once, or up to every 5 years for those at high risk.  The vaccine is also recommended for adults younger than 65 years old with certain underlying conditions that make them high risk for pneumonia.  Influenza vaccine is recommended to protect against seasonal influenza or "the flu." Influenza is a serious disease that can lead to hospitalization and sometimes even death. Traditional flu vaccines (called trivalent vaccines) are made to protect against three flu viruses; an influenza A (H1N1) virus, an influenza A (H3N2) virus, and an influenza B virus. In addition, there are flu vaccines made to protect against four flu viruses (called "quadrivalent" vaccines). These vaccines protect against the same viruses as the trivalent vaccine and an additional B virus.  We recommend the high dose influenza vaccine to those 65 years and  older.  Hepatitis B vaccine to protect against a form of infection of the liver by a virus acquired from blood or body fluids, particularly for high risk groups.  Td or Tdap vaccine to protect against Tetanus, diphtheria and pertussis which can be very serious.  These diseases are caused by bacteria.  Diphtheria and pertussis are spread from person to person through coughing or sneezing.  Tetanus enters the body through cuts, scratches, or wounds.  Tetanus (Lockjaw) causes painful muscle tightening and  stiffness, usually all over the body.  Diphtheria can cause a thick coating to form in the back of the throat.  It can lead to breathing problems, paralysis, heart failure, and death.  Pertussis (Whooping Cough) causes severe coughing spells, which can cause difficulty breathing, vomiting and disturbed sleep.  Td or Tdap is usually given every 10 years.  Shingles vaccine to protect against Varicella Zoster if you are older than age 7, or younger than 65 years old with certain underlying illness.    Cancer Screening: Most routine colon cancer screening begins at the age of 60.  Subsequent colonoscopies are performed either every 5-10 years for normal screening, or every 2-5 years for higher risks patients, up until age 5 years of age. Annual screening is done with easy to use take-home tests to check for hidden blood in the stool called hemoccult tests.  Sigmoidoscopy or colonoscopy can detect the earliest forms of colon cancer and is life saving. These tests use a small camera at the end of a tube to directly examine the colon.   Pelvic Exam and Pap Smear: Pelvic exams and pap smears are performed routinely to evaluate for abnormalities as well as cancers including cervical and vaginal cancers.  This is generally performed every 2-3 years for most women, or more frequently for higher risk patients.  Mammograms: Mammograms are used to screen for breast cancer.  Medicare covers baseline screening once  from ages 41-80 years old, but will cover mammograms yearly for those 40 years and older.  In accordance with other guidelines, you may not need a mammogram every year though.  The decision on how frequently you need a mammogram should be discussed with you medical provider.    Osteoporosis Screening: Screening for osteoporosis usually begins at age 2 for women, and can be done as frequent as every 2 years.  However, women or men with higher risk of osteoporosis may be screened earlier than age 28.  Osteoporosis or low bone mass is diminished bone strength from alterations in bone architecture leading to bone fragility and increased fracture risk.     Cardiovascular Screening: Fat and cholesterol leaves deposits in your arteries that can block them. This causes heart disease and vessel disease elsewhere in your body.  If your cholesterol is found to be high, or if you have heart disease or certain other medical conditions, then you may need to have your cholesterol monitored frequently and be treated with medication. Cardiovascular screening in the form of lab tests for cholesterol, HDL and triglycerides can be done every 5 years.  A screening electrocardiogram can be done as part of the Welcome to Medicare physical.  Diabetes Screening: Diabetes screening can be done at least every 3 years for those with risk factors,  or every 6-81months for prediabetic patients.  Screening includes fasting blood sugar test or glucose tolerance test.  Risk factors include hypertension, dyslipidemia, obesity, previously abnormal glucose tests, family history of diabetes, age 61 years or older, and history of gestations diabetes.   AAA (abdominal aortic aneurysm) Screening: Medicare allows for a one time ultrasound to screen for abdominal aortic aneurysm if done as a referral as part of the Welcome to Medicare exam.  Men eligible for this screening include those men between age 25-62 years of age who have smoked at  least 100 cigarettes in his lifetime and/or has a family history of AAA.  HIV Screening:  Medicare allows for yearly screening for patients at high risk for contracting HIV  disease.

## 2018-10-28 ENCOUNTER — Other Ambulatory Visit: Payer: Self-pay

## 2018-10-28 ENCOUNTER — Encounter: Payer: Self-pay | Admitting: Family Medicine

## 2018-10-28 ENCOUNTER — Ambulatory Visit (INDEPENDENT_AMBULATORY_CARE_PROVIDER_SITE_OTHER): Payer: Medicare Other | Admitting: Family Medicine

## 2018-10-28 VITALS — BP 132/80 | HR 61 | Temp 97.7°F | Ht 67.0 in | Wt 332.6 lb

## 2018-10-28 DIAGNOSIS — E559 Vitamin D deficiency, unspecified: Secondary | ICD-10-CM | POA: Diagnosis not present

## 2018-10-28 DIAGNOSIS — E669 Obesity, unspecified: Secondary | ICD-10-CM

## 2018-10-28 DIAGNOSIS — Z1239 Encounter for other screening for malignant neoplasm of breast: Secondary | ICD-10-CM | POA: Diagnosis not present

## 2018-10-28 DIAGNOSIS — Z1159 Encounter for screening for other viral diseases: Secondary | ICD-10-CM

## 2018-10-28 DIAGNOSIS — R252 Cramp and spasm: Secondary | ICD-10-CM

## 2018-10-28 DIAGNOSIS — Z0000 Encounter for general adult medical examination without abnormal findings: Secondary | ICD-10-CM

## 2018-10-28 DIAGNOSIS — E1169 Type 2 diabetes mellitus with other specified complication: Secondary | ICD-10-CM

## 2018-10-28 DIAGNOSIS — E039 Hypothyroidism, unspecified: Secondary | ICD-10-CM

## 2018-10-28 DIAGNOSIS — I10 Essential (primary) hypertension: Secondary | ICD-10-CM

## 2018-10-28 DIAGNOSIS — Z23 Encounter for immunization: Secondary | ICD-10-CM | POA: Diagnosis not present

## 2018-10-28 DIAGNOSIS — E785 Hyperlipidemia, unspecified: Secondary | ICD-10-CM

## 2018-10-28 DIAGNOSIS — E038 Other specified hypothyroidism: Secondary | ICD-10-CM

## 2018-10-28 DIAGNOSIS — K219 Gastro-esophageal reflux disease without esophagitis: Secondary | ICD-10-CM

## 2018-10-28 DIAGNOSIS — Z1211 Encounter for screening for malignant neoplasm of colon: Secondary | ICD-10-CM | POA: Diagnosis not present

## 2018-10-28 DIAGNOSIS — E2839 Other primary ovarian failure: Secondary | ICD-10-CM

## 2018-10-28 MED ORDER — METFORMIN HCL 1000 MG PO TABS: ORAL_TABLET | ORAL | 0 refills | Status: DC

## 2018-10-29 LAB — COMPREHENSIVE METABOLIC PANEL
ALT: 9 IU/L (ref 0–32)
AST: 10 IU/L (ref 0–40)
Albumin/Globulin Ratio: 1.3 (ref 1.2–2.2)
Albumin: 3.9 g/dL (ref 3.6–4.8)
Alkaline Phosphatase: 78 IU/L (ref 39–117)
BUN/Creatinine Ratio: 15 (ref 12–28)
BUN: 18 mg/dL (ref 8–27)
Bilirubin Total: <0.2 mg/dL (ref 0.0–1.2)
CO2: 24 mmol/L (ref 20–29)
Calcium: 9.4 mg/dL (ref 8.7–10.3)
Chloride: 105 mmol/L (ref 96–106)
Creatinine, Ser: 1.23 mg/dL — ABNORMAL HIGH (ref 0.57–1.00)
GFR calc Af Amer: 53 mL/min/{1.73_m2} — ABNORMAL LOW (ref >59)
GFR calc non Af Amer: 46 mL/min/{1.73_m2} — ABNORMAL LOW (ref >59)
Globulin, Total: 3 g/dL (ref 1.5–4.5)
Glucose: 106 mg/dL — ABNORMAL HIGH (ref 65–99)
Potassium: 4.6 mmol/L (ref 3.5–5.2)
Sodium: 143 mmol/L (ref 134–144)
Total Protein: 6.9 g/dL (ref 6.0–8.5)

## 2018-10-29 LAB — CBC WITH DIFFERENTIAL/PLATELET
Basophils Absolute: 0 10*3/uL (ref 0.0–0.2)
Basos: 1 %
EOS (ABSOLUTE): 0.1 10*3/uL (ref 0.0–0.4)
Eos: 2 %
Hematocrit: 32.8 % — ABNORMAL LOW (ref 34.0–46.6)
Hemoglobin: 10.4 g/dL — ABNORMAL LOW (ref 11.1–15.9)
Immature Grans (Abs): 0 10*3/uL (ref 0.0–0.1)
Immature Granulocytes: 0 %
Lymphocytes Absolute: 1.5 10*3/uL (ref 0.7–3.1)
Lymphs: 25 %
MCH: 28.8 pg (ref 26.6–33.0)
MCHC: 31.7 g/dL (ref 31.5–35.7)
MCV: 91 fL (ref 79–97)
Monocytes Absolute: 0.3 10*3/uL (ref 0.1–0.9)
Monocytes: 5 %
Neutrophils Absolute: 4.2 10*3/uL (ref 1.4–7.0)
Neutrophils: 67 %
Platelets: 317 10*3/uL (ref 150–450)
RBC: 3.61 x10E6/uL — ABNORMAL LOW (ref 3.77–5.28)
RDW: 14.7 % (ref 12.3–15.4)
WBC: 6.2 10*3/uL (ref 3.4–10.8)

## 2018-10-29 LAB — T3: T3, Total: 115 ng/dL (ref 71–180)

## 2018-10-29 LAB — HEMOGLOBIN A1C
Est. average glucose Bld gHb Est-mCnc: 163 mg/dL
Hgb A1c MFr Bld: 7.3 % — ABNORMAL HIGH (ref 4.8–5.6)

## 2018-10-29 LAB — LIPID PANEL
Chol/HDL Ratio: 4.2 ratio (ref 0.0–4.4)
Cholesterol, Total: 172 mg/dL (ref 100–199)
HDL: 41 mg/dL (ref >39)
LDL Calculated: 99 mg/dL (ref 0–99)
Triglycerides: 161 mg/dL — ABNORMAL HIGH (ref 0–149)
VLDL Cholesterol Cal: 32 mg/dL (ref 5–40)

## 2018-10-29 LAB — MAGNESIUM: Magnesium: 1.7 mg/dL (ref 1.6–2.3)

## 2018-10-29 LAB — MICROALBUMIN / CREATININE URINE RATIO
Creatinine, Urine: 122.7 mg/dL
Microalb/Creat Ratio: 11.2 mg/g creat (ref 0.0–30.0)
Microalbumin, Urine: 13.8 ug/mL

## 2018-10-29 LAB — T4, FREE: Free T4: 1.17 ng/dL (ref 0.82–1.77)

## 2018-10-29 LAB — TSH: TSH: 2.69 u[IU]/mL (ref 0.450–4.500)

## 2018-10-29 LAB — HEPATITIS C ANTIBODY: Hep C Virus Ab: <0.1 s/co ratio (ref 0.0–0.9)

## 2018-10-29 LAB — VITAMIN B12: Vitamin B-12: 894 pg/mL (ref 232–1245)

## 2018-10-30 ENCOUNTER — Encounter: Payer: Self-pay | Admitting: Family Medicine

## 2018-10-30 LAB — VITAMIN D 25 HYDROXY (VIT D DEFICIENCY, FRACTURES): Vit D, 25-Hydroxy: 23.5 ng/mL — ABNORMAL LOW (ref 30.0–100.0)

## 2018-11-27 ENCOUNTER — Other Ambulatory Visit: Payer: Self-pay | Admitting: Internal Medicine

## 2018-11-27 MED ORDER — FAMOTIDINE 20 MG PO TABS: 20.0000 mg | ORAL_TABLET | Freq: Two times a day (BID) | ORAL | 0 refills | Status: DC

## 2018-11-27 NOTE — Progress Notes (Signed)
Pt has been taking zantac as she thought it was not recalled. Pt has advised to stop med and start pecpid. Please send in [facility]icid [facility]

## 2018-11-27 NOTE — Progress Notes (Signed)
Sent in pepcid 20mg 

## 2018-11-27 NOTE — Progress Notes (Signed)
Please send in famotidine (Pepcid) 20 mg PO bid in place of the Zantac. Only give her 3 months and if she is still needing this medication daily we will need to talk.

## 2018-12-02 ENCOUNTER — Other Ambulatory Visit: Payer: Self-pay | Admitting: Family Medicine

## 2018-12-09 ENCOUNTER — Other Ambulatory Visit: Payer: Self-pay | Admitting: Internal Medicine

## 2018-12-09 MED ORDER — LOSARTAN POTASSIUM-HCTZ 100-12.5 MG PO TABS: 1.0000 | ORAL_TABLET | Freq: Every day | ORAL | 1 refills | Status: DC

## 2018-12-27 ENCOUNTER — Ambulatory Visit
Admission: RE | Admit: 2018-12-27 | Discharge: 2018-12-27 | Disposition: A | Discharge status: Home / Self Care | Payer: Medicare Other | Source: Ambulatory Visit | Attending: Family Medicine | Admitting: Family Medicine

## 2018-12-27 DIAGNOSIS — Z1231 Encounter for screening mammogram for malignant neoplasm of breast: Secondary | ICD-10-CM | POA: Diagnosis not present

## 2018-12-27 DIAGNOSIS — Z1239 Encounter for other screening for malignant neoplasm of breast: Secondary | ICD-10-CM

## 2018-12-27 DIAGNOSIS — Z13820 Encounter for screening for osteoporosis: Secondary | ICD-10-CM | POA: Diagnosis not present

## 2018-12-27 DIAGNOSIS — E2839 Other primary ovarian failure: Secondary | ICD-10-CM

## 2018-12-27 DIAGNOSIS — Z780 Asymptomatic menopausal state: Secondary | ICD-10-CM | POA: Diagnosis not present

## 2018-12-31 ENCOUNTER — Encounter: Payer: Self-pay | Admitting: Family Medicine

## 2019-01-06 ENCOUNTER — Ambulatory Visit (INDEPENDENT_AMBULATORY_CARE_PROVIDER_SITE_OTHER): Payer: Medicare Other

## 2019-01-06 ENCOUNTER — Ambulatory Visit (INDEPENDENT_AMBULATORY_CARE_PROVIDER_SITE_OTHER): Payer: Medicare Other | Admitting: Orthopaedic Surgery

## 2019-01-06 ENCOUNTER — Encounter (INDEPENDENT_AMBULATORY_CARE_PROVIDER_SITE_OTHER): Payer: Self-pay | Admitting: Orthopaedic Surgery

## 2019-01-06 DIAGNOSIS — M545 Low back pain, unspecified: Secondary | ICD-10-CM

## 2019-01-06 DIAGNOSIS — G8929 Other chronic pain: Secondary | ICD-10-CM

## 2019-01-06 NOTE — Progress Notes (Signed)
Office Visit Note   Patient: [name]           Date of Birth: [birth year]           MRN: [id number] Visit Date: 01/06/2019              Requested by: Girtha Rm, NP-C [place], [place] [address] PCP: Girtha Rm, NP-C   Assessment & Plan: Visit Diagnoses:  1. Chronic low back pain, unspecified back pain laterality, unspecified whether sciatica present     Plan: Her lumbar spine symptoms are absolutely related to her weight.  We will send her to outpatient physical therapy to see if they can help with exercises they can decrease her back pain and any other modalities that they see necessary to get her pain symptoms to improve.  She does take Aleve and I think this can be helpful for her.  I have talked her extensively about her weight as well.  We will see her back in about 6 weeks after course of therapy.The patient meets the AMA guidelines for Morbid (severe) obesity with a BMI > 40.0 and I have recommended weight loss.  Follow-Up Instructions: Return in about 6 weeks (around 02/17/2019).   Orders:  Orders Placed This Encounter  Procedures  . XR Lumbar Spine 2-3 Views   No orders of the defined types were placed in this encounter.     Procedures: No procedures performed   Clinical Data: No additional findings.   Subjective: Chief Complaint  Patient presents with  . Lower Back - Pain  The patient is a well-known patient of mine.  She comes in with worsening low back pain with no sciatic or radicular symptoms.  Is mainly across the base of her lumbar spine.  She is someone who is been referred to bariatric clinic before.  She weighs 332 pounds.  Her weight is gone up she states.  She denies any change in bowel or bladder function is just across the base of her spine that she hurts.  She is a diabetic as well.  HPI  Review of Systems She otherwise denies any headache, chest pain, shortness of breath, fever, chills, nausea,  vomiting  Objective: Vital Signs: LMP  (LMP Unknown)   Physical Exam She is alert and orient x3 and in no acute distress Ortho Exam Examination shows she is morbidly obese.  Her pain is across the lower aspect of her lumbar spine but no radicular symptoms.  She has good strength in the bilateral lower extremities and normal sensation.  She has normal strength as well.  She has normal reflexes. Specialty Comments:  No specialty comments available.  Imaging: Xr Lumbar Spine 2-3 Views  Result Date: 01/06/2019 2 views of the lumbar spine show degenerative changes at several levels.    PMFS History: Patient Active Problem List   Diagnosis Date Noted  . Estrogen deficiency 10/28/2018  . Benign positional vertigo 08/30/2017  . Hyperlipidemia associated with type 2 diabetes mellitus ([place]) 08/30/2017  . Acute gouty arthritis 08/30/2017  . Status post total right knee replacement 08/21/2017  . Acute blood loss as cause of postoperative anemia 02/16/2017  . GERD (gastroesophageal reflux disease) 02/16/2017  . Vitamin D deficiency 02/16/2017  . 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 Unilateral primary osteoarthritis, right knee 01/10/2017  . Unilateral primary osteoarthritis, left knee 01/10/2017  . Subclinical hypothyroidism 10/11/2016  . Decreased hearing of left ear 10/11/2016  . Diabetes mellitus type  2 in obese ([place]) 12/31/2015  . Hypertriglyceridemia 08/30/2015  . Dyspnea 04/02/2013  . Cough 02/12/2013  . Lipoma 09/30/2012  . Bilateral carpal tunnel syndrome 04/29/2012  . Abdominal pain, left lateral 04/03/2012  . Edema 08/22/2011  . OBESITY, MORBID 10/16/2007  . Essential hypertension 10/16/2007  . Osteoarthrosis involving lower leg 10/16/2007   Past Medical History:  Diagnosis Date  . Abdominal hernia   . Acute blood loss as cause of postoperative anemia 02/16/2017  . Arthritis    knees, back, R ankle   . Bilateral carpal tunnel syndrome 04/29/2012  .  Diabetes mellitus   . Diabetes mellitus type 2 in obese ([place]) 12/31/2015  . Edema    legs  . GERD (gastroesophageal reflux disease)   . HOH (hard of hearing)   . Hypertension   . Lipoma 09/30/2012  . Obesity    morbid obesity  . OBESITY, MORBID 10/16/2007   Qualifier: Diagnosis of  By: Enrique Sack    . Osteoarthrosis involving lower leg 10/16/2007   Qualifier: Diagnosis of  By: Enrique Sack    . 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 Status post total right knee replacement 08/21/2017  . Subclinical hypothyroidism 10/11/2016  . Tachycardia 08/22/2011  . Vertigo   . Vitamin D deficiency 02/16/2017    Family History  Problem Relation Age of Onset  . Hypertension Mother   . Kidney disease Mother   . Diabetes Father   . Hypertension Father   . Diabetes Sister   . Hypertension Sister   . Diabetes Brother   . Hypertension Brother   . Heart disease Brother 21       heart attack  . Hypertension Sister     Past Surgical History:  Procedure Laterality Date  . [place]?   right  . CARPAL TUNNEL RELEASE Left 07/10/2014   Procedure: LEFT ENDOSCOPIC CARPAL TUNNEL RELEASE ;  Surgeon: Jolyn Nap, MD;  Location: [facility];  Service: Orthopedics;  Laterality: Left;  . CARPAL TUNNEL RELEASE Right   . COLONOSCOPY    . HERNIA REPAIR  07/03/2011   abd hernia  . NOSE SURGERY  2008   sinus  . TOTAL KNEE ARTHROPLASTY Left 02/06/2017   Procedure: LEFT TOTAL KNEE ARTHROPLASTY;  Surgeon: Mcarthur Rossetti, MD;  Location: [facility];  Service: Orthopedics;  Laterality: Left;  . TOTAL KNEE ARTHROPLASTY Right 08/21/2017  . TOTAL KNEE ARTHROPLASTY Right 08/21/2017   Procedure: RIGHT TOTAL KNEE ARTHROPLASTY;  Surgeon: Mcarthur Rossetti, MD;  Location: [facility];  Service: Orthopedics;  Laterality: Right;  . TUBAL LIGATION     Social History   Occupational History  . 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 Smoking status: Never Smoker  . Smokeless tobacco: Never Used  Substance and Sexual Activity  . Alcohol use: No  . Drug use: No  . Sexual activity: Never

## 2019-01-06 NOTE — Addendum Note (Signed)
Addended by: Precious Bard on: 01/06/2019 03:13 PM   Modules accepted: Orders

## 2019-01-16 ENCOUNTER — Other Ambulatory Visit: Payer: Self-pay

## 2019-01-16 ENCOUNTER — Ambulatory Visit: Payer: Medicare Other | Attending: Orthopaedic Surgery | Admitting: Physical Therapy

## 2019-01-16 ENCOUNTER — Encounter: Payer: Self-pay | Admitting: Physical Therapy

## 2019-01-16 DIAGNOSIS — M545 Low back pain, unspecified: Secondary | ICD-10-CM

## 2019-01-16 DIAGNOSIS — G8929 Other chronic pain: Secondary | ICD-10-CM | POA: Diagnosis not present

## 2019-01-16 DIAGNOSIS — R2689 Other abnormalities of gait and mobility: Secondary | ICD-10-CM | POA: Diagnosis not present

## 2019-01-16 DIAGNOSIS — M6281 Muscle weakness (generalized): Secondary | ICD-10-CM | POA: Diagnosis not present

## 2019-01-17 NOTE — Therapy (Signed)
[place], [place], [address] Phone: [phone]   Fax:  [phone]  Physical Therapy Evaluation  Patient Details  Name: [name] MRN: [id number] Date of Birth: August 24, 1951 Referring Provider (PT): Dr. Jean Rosenthal    Encounter Date: 01/16/2019  PT End of Session - 01/17/19 0806    Visit Number  1    Number of Visits  12    Date for PT Re-Evaluation  02/27/19    PT Start Time  1500    PT Stop Time  1540    PT Time Calculation (min)  40 min    Activity Tolerance  Patient limited by pain;Patient tolerated treatment well    Behavior During Therapy  [facility] for tasks assessed/performed       Past Medical History:  Diagnosis Date  . Abdominal hernia   . Acute blood loss as cause of postoperative anemia 02/16/2017  . Arthritis    knees, back, R ankle   . Bilateral carpal tunnel syndrome 04/29/2012  . Diabetes mellitus   . Diabetes mellitus type 2 in obese ([place]) 12/31/2015  . Edema    legs  . GERD (gastroesophageal reflux disease)   . HOH (hard of hearing)   . Hypertension   . Lipoma 09/30/2012  . Obesity    morbid obesity  . OBESITY, MORBID 10/16/2007   Qualifier: Diagnosis of  By: Enrique Sack    . Osteoarthrosis involving lower leg 10/16/2007   Qualifier: Diagnosis of  By: Enrique Sack    . 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 Status post total right knee replacement 08/21/2017  . Subclinical hypothyroidism 10/11/2016  . Tachycardia 08/22/2011  . Vertigo   . Vitamin D deficiency 02/16/2017    Past Surgical History:  Procedure Laterality Date  . [place]?   right  . CARPAL TUNNEL RELEASE Left 07/10/2014   Procedure: LEFT ENDOSCOPIC CARPAL TUNNEL RELEASE ;  Surgeon: Jolyn Nap, MD;  Location: [facility];  Service: Orthopedics;  Laterality: Left;  . CARPAL TUNNEL RELEASE Right   . COLONOSCOPY    . HERNIA REPAIR  07/03/2011    abd hernia  . NOSE SURGERY  2008   sinus  . TOTAL KNEE ARTHROPLASTY Left 02/06/2017   Procedure: LEFT TOTAL KNEE ARTHROPLASTY;  Surgeon: Mcarthur Rossetti, MD;  Location: [facility];  Service: Orthopedics;  Laterality: Left;  . TOTAL KNEE ARTHROPLASTY Right 08/21/2017  . TOTAL KNEE ARTHROPLASTY Right 08/21/2017   Procedure: RIGHT TOTAL KNEE ARTHROPLASTY;  Surgeon: Mcarthur Rossetti, MD;  Location: [facility];  Service: Orthopedics;  Laterality: Right;  . TUBAL LIGATION      There were no vitals filed for this visit.   Subjective Assessment - 01/16/19 1506    Subjective  Patient presents with chronic back pain which has become more severe over the past 2 yrs.  She reports pain in bilateral low back, does not radiate.  Has a fatigue in her low back with standing. Pain has been interfering with her ability to walk.  She has gained weight over the past 2 yrs.        Pertinent History  TKR 2017, 2018 , DM , morbid obesity     How long can you sit comfortably?  not limited     How long can you stand comfortably?  < 10 min if standing still     How long can you walk comfortably?  Has to  use a cane , can barely walk 30 min (Sept 2019 was the last time she was able to do this )     Diagnostic tests  XR multilevel diffuse degenerative changes     Patient Stated Goals  Patient would like to be able to walk with a cane with less pain to lose     Currently in Pain?  Yes    Pain Score  6     Pain Location  Back    Pain Orientation  Right;Left;Lower    Pain Descriptors / Indicators  Tightness;Tiring    Pain Type  Chronic pain    Pain Onset  More than a month ago    Pain Frequency  Constant    Aggravating Factors   AM, cold, standing     Pain Relieving Factors  meds, sitting down     Effect of Pain on Daily Activities  limits her particiaption in [place], cleaning, etc          [facility] PT Assessment - 01/17/19 0001      Assessment   Medical Diagnosis  low back pain     Referring Provider (PT)   Dr. Jean Rosenthal     Onset Date/Surgical Date  --   chronic    Next MD Visit  6 weeks     Prior Therapy  Yes for knee       Precautions   Precautions  None      Restrictions   Weight Bearing Restrictions  No      Balance Screen   Has the patient fallen in the past 6 months  No   vertigo   Has the patient had a decrease in activity level because of a fear of falling?   Yes    Is the patient reluctant to leave their home because of a fear of falling?   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, family       Cognition   Overall Cognitive Status  Within Functional Limits for tasks assessed      Observation/Other Assessments   Focus on Therapeutic Outcomes (FOTO)   46%      Sensation   Light Touch  Impaired by gross assessment    Additional Comments  diabetic neuropathy      Coordination   Gross Motor Movements are Fluid and Coordinated  Not tested      Posture/Postural Control   Posture/Postural Control  Postural limitations    Postural Limitations  Rounded Shoulders;Forward head    Posture Comments  genu valgus,slight trunk flexion       Strength   Overall Strength Comments  knees 5/5    Right Hip Flexion  3-/5    Right Hip ABduction  2+/5    Left Hip Flexion  3-/5    Left Hip ABduction  2+/5      Flexibility   Hamstrings  approx 60-65 deg no pain in low back       Palpation   Palpation comment  pain with gross palpation to bilateral  lumbar region, apprx.  L4-L5.  Pain with palpation to bilateral lateral hips, surrounding greater trochanter       Bed Mobility   Bed Mobility  --   Unable to lie on L side , had to sit then swing legs, abnorm     Ambulation/Gait    Ambulation/Gait  Yes    Ambulation/Gait Assistance  6: Modified independent (Device/Increase time)    Ambulation Distance (Feet)  150 Feet    Assistive device  None    Gait Pattern  Decreased step length - right;Decreased step length - left;Decreased hip/knee flexion - right;Decreased hip/knee flexion - left;Antalgic;Lateral hip instability;Lateral trunk lean to right;Lateral trunk lean to left;Trunk flexed    Gait Comments  increased lateral trunk lean        Objective measurements completed on examination: See above findings.      [facility] Adult PT Treatment/Exercise - 01/17/19 0001      Self-Care   Self-Care  Other Self-Care Comments    Other Self-Care Comments   HEP, activity, eval findings, aquatics       Lumbar Exercises: Stretches   Active Hamstring Stretch  Right;Left;2 reps    Single Knee to Chest Stretch  Right;Left;2 reps    Single Knee to Chest Stretch Limitations  sheet     Lower Trunk Rotation  5 reps;10 seconds    Pelvic Tilt  5 reps             PT Education - 01/17/19 0804    Education Details  PT/POC, HEP, hip weakness, options for exercise     Person(s) Educated  Patient    Methods  Explanation    Comprehension  Verbalized understanding;Returned demonstration;Verbal cues required;Tactile cues required;Need further instruction          PT Long Term Goals - 01/17/19 0817      PT LONG TERM GOAL #1   Title  Pt will be I with HEP for long term wellness, including trunk flexibility/stability and hip strength     Time  6    Period  Weeks    Status  New    [place] Date  02/27/19      PT LONG TERM GOAL #2   Title  Pt will be able to stand for ADLs and home tasks up to 15 min with less pain overall, no more than 3/10 most of the time     Time  6    Period  Weeks    Status  New    Target Date  02/27/19      PT LONG TERM GOAL #3   Title  Pt will be able to lie on her L side for sleeping a portion of the time with min pain increase.     Time  6     Period  Weeks    Status  New    Target Date  02/27/19      PT LONG TERM GOAL #4   Title  Pt will be able to walk for 30 min in the community with pain <5/10 for shopping, errands, church .     Time  6    Period  Weeks    Status  New    Target Date  02/27/19      PT LONG TERM GOAL #5   Title  Pt will increase hip abduction strength to 3+/5 in each LE to work towards a more normalized gait pattern.     Baseline  3-/5  Time  6    Period  Weeks    Status  New    Target Date  02/27/19             Plan - 01/17/19 0807    Clinical Impression Statement  This patient presents for low complexity eval of low back pain which has been chronic and ongoing for many years.  She has declined in her ability to walk and participate in religious activities which are important to her .  She certainly needs to be more active and find an exercise she can do that minimizes pain.  We will help her with that, as well as give basic exercises and educaton on proper body mechanics to minimize strain on back.      Clinical Presentation  Stable    Clinical Decision Making  Low    Rehab Potential  Good    PT Frequency  2x / week    PT Duration  6 weeks    PT Treatment/Interventions  ADLs/Self Care Home Management;Cryotherapy;Balance training;Electrical Stimulation;Moist Heat;Gait training;Therapeutic exercise;Therapeutic activities;Functional mobility training;Neuromuscular re-education;Manual techniques;Patient/family education    PT Next Visit Plan  check HEP , may need wedge.  Flexion based strength, will probably need sitting or standing hip strength     PT Home Exercise Plan  knee to chest, PPT, hamstring, standing hip abd , lower trunk rotation     Consulted and Agree with Plan of Care  Patient       Patient will benefit from skilled therapeutic intervention in order to improve the following deficits and impairments:  Abnormal gait, Decreased balance, Decreased endurance, Decreased mobility,  Difficulty walking, Obesity, Improper body mechanics, Decreased range of motion, Decreased activity tolerance, Decreased strength, Increased fascial restricitons, Impaired UE functional use, Pain, Postural dysfunction  Visit Diagnosis: Chronic bilateral low back pain without sciatica  Other abnormalities of gait and mobility  Muscle weakness (generalized)     Problem List Patient Active Problem List   Diagnosis Date Noted  . Severe obesity (BMI >= 40) ([place]) 01/06/2019  . Estrogen deficiency 10/28/2018  . Benign positional vertigo 08/30/2017  . Hyperlipidemia associated with type 2 diabetes mellitus ([place]) 08/30/2017  . Acute gouty arthritis 08/30/2017  . Status post total right knee replacement 08/21/2017  . Acute blood loss as cause of postoperative anemia 02/16/2017  . GERD (gastroesophageal reflux disease) 02/16/2017  . Vitamin D deficiency 02/16/2017  . 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 Unilateral primary osteoarthritis, right knee 01/10/2017  . Unilateral primary osteoarthritis, left knee 01/10/2017  . Subclinical hypothyroidism 10/11/2016  . Decreased hearing of left ear 10/11/2016  . Diabetes mellitus type 2 in obese ([place]) 12/31/2015  . Hypertriglyceridemia 08/30/2015  . Dyspnea 04/02/2013  . Cough 02/12/2013  . Lipoma 09/30/2012  . Bilateral carpal tunnel syndrome 04/29/2012  . Abdominal pain, left lateral 04/03/2012  . Edema 08/22/2011  . OBESITY, MORBID 10/16/2007  . Essential hypertension 10/16/2007  . Osteoarthrosis involving lower leg 10/16/2007    Eduard Penkala 01/17/2019, 8:21 AM  [place], [place], [address] Phone: [phone]   Fax:  [phone]  Name: MARTIE MUHLBAUER MRN: [id number] Date of Birth: 1951-10-07   Raeford Razor, PT 01/17/19 8:21 AM Phone: [phone] Fax: [phone]

## 2019-01-21 ENCOUNTER — Ambulatory Visit: Payer: Medicare Other | Attending: Orthopaedic Surgery | Admitting: Physical Therapy

## 2019-01-21 DIAGNOSIS — M545 Low back pain, unspecified: Secondary | ICD-10-CM

## 2019-01-21 DIAGNOSIS — M6281 Muscle weakness (generalized): Secondary | ICD-10-CM | POA: Diagnosis not present

## 2019-01-21 DIAGNOSIS — G8929 Other chronic pain: Secondary | ICD-10-CM | POA: Diagnosis not present

## 2019-01-21 DIAGNOSIS — R2689 Other abnormalities of gait and mobility: Secondary | ICD-10-CM | POA: Insufficient documentation

## 2019-01-21 NOTE — Therapy (Signed)
[place], [place], [address] Phone: [phone]   Fax:  [phone]  Physical Therapy Treatment  Patient Details  Name: [name] MRN: [id number] Date of Birth: [birth year] Referring Provider (PT): Dr. Jean Rosenthal    Encounter Date: 01/21/2019  PT End of Session - 01/21/19 1112    Visit Number  2    Number of Visits  12    Date for PT Re-Evaluation  02/27/19    PT Start Time  1001    PT Stop Time  1045    PT Time Calculation (min)  44 min    Activity Tolerance  Patient limited by pain;Patient tolerated treatment well    Behavior During Therapy  [facility] for tasks assessed/performed       Past Medical History:  Diagnosis Date  . Abdominal hernia   . Acute blood loss as cause of postoperative anemia 02/16/2017  . Arthritis    knees, back, R ankle   . Bilateral carpal tunnel syndrome 04/29/2012  . Diabetes mellitus   . Diabetes mellitus type 2 in obese ([place]) 12/31/2015  . Edema    legs  . GERD (gastroesophageal reflux disease)   . HOH (hard of hearing)   . Hypertension   . Lipoma 09/30/2012  . Obesity    morbid obesity  . OBESITY, MORBID 10/16/2007   Qualifier: Diagnosis of  By: Enrique Sack    . Osteoarthrosis involving lower leg 10/16/2007   Qualifier: Diagnosis of  By: Enrique Sack    . 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 Status post total right knee replacement 08/21/2017  . Subclinical hypothyroidism 10/11/2016  . Tachycardia 08/22/2011  . Vertigo   . Vitamin D deficiency 02/16/2017    Past Surgical History:  Procedure Laterality Date  . [place]?   right  . CARPAL TUNNEL RELEASE Left 07/10/2014   Procedure: LEFT ENDOSCOPIC CARPAL TUNNEL RELEASE ;  Surgeon: Jolyn Nap, MD;  Location: [place];  Service: Orthopedics;  Laterality: Left;  . CARPAL TUNNEL RELEASE Right   . COLONOSCOPY    . HERNIA REPAIR  07/03/2011   abd hernia  . NOSE SURGERY  2008   sinus  . TOTAL KNEE ARTHROPLASTY Left 02/06/2017   Procedure: LEFT TOTAL KNEE ARTHROPLASTY;  Surgeon: Mcarthur Rossetti, MD;  Location: [place];  Service: Orthopedics;  Laterality: Left;  . TOTAL KNEE ARTHROPLASTY Right 08/21/2017  . TOTAL KNEE ARTHROPLASTY Right 08/21/2017   Procedure: RIGHT TOTAL KNEE ARTHROPLASTY;  Surgeon: Mcarthur Rossetti, MD;  Location: [place];  Service: Orthopedics;  Laterality: Right;  . TUBAL LIGATION      There were no vitals filed for this visit.  Subjective Assessment - 01/21/19 1020    Subjective  Pt c/o constant pain and is a 9/10 today.     Currently in Pain?  Yes    Pain Score  9     Pain Location  Back    Pain Orientation  Right;Left;Lower    Pain Descriptors / Indicators  Tightness    Pain Type  Chronic pain    Aggravating Factors   standing, cold, AM    Pain Relieving Factors  meds, sitting down                       [facility] Adult PT Treatment/Exercise - 01/21/19 0001      Lumbar Exercises: Stretches   Active Hamstring Stretch  Right;Left;10 seconds;5 reps   Cues to hold stretch   Single Knee to Chest Stretch  Right;Left;5 reps;10 seconds    Single Knee to Chest Stretch Limitations  sheet    Lower Trunk Rotation  5 reps;10 seconds    Pelvic Tilt  10 reps   Cues for ab setting and tilting     Lumbar Exercises: Supine   Clam  10 reps    Other Supine Lumbar Exercises  Chin tucks for forward head posture x10    Other Supine Lumbar Exercises  Ball squeezes x10 with 3 sec hold; Marching in hooklying 15 reps; Hip ABD 15 reps each side             PT Education - 01/21/19 1101    Education Details  HEP; emphasized importance of adhering to HEP    Person(s) Educated  Patient    Methods  Explanation;Demonstration;Verbal cues    Comprehension  Verbalized understanding;Returned demonstration;Verbal cues required          PT Long Term Goals - 01/17/19 0817      PT LONG TERM GOAL  #1   Title  Pt will be I with HEP for long term wellness, including trunk flexibility/stability and hip strength     Time  6    Period  Weeks    Status  New    Target Date  02/27/19      PT LONG TERM GOAL #2   Title  Pt will be able to stand for ADLs and home tasks up to 15 min with less pain overall, no more than 3/10 most of the time     Time  6    Period  Weeks    Status  New    Target Date  02/27/19      PT LONG TERM GOAL #3   Title  Pt will be able to lie on her L side for sleeping a portion of the time with min pain increase.     Time  6    Period  Weeks    Status  New    Target Date  02/27/19      PT LONG TERM GOAL #4   Title  Pt will be able to walk for 30 min in the community with pain <5/10 for shopping, errands, church .     Time  6    Period  Weeks    Status  New    Target Date  02/27/19      PT LONG TERM GOAL #5   Title  Pt will increase hip abduction strength to 3+/5 in each LE to work towards a more normalized gait pattern.     Baseline  3-/5    Time  6    Period  Weeks    Status  New    Target Date  02/27/19            Plan - 01/21/19 1113    Clinical Impression Statement  Pt c/o 9/10 pain today before tx and states that it's a constant pain. Reviewed HEP with pt, but she did not demo good recall and stated that she hasn't been doing them much because of her spasms in left quadriceps. Updated HEP with supine clamshells and abduction. Plan to transtion to more seated exercises d/t pt not preferring to be on back. Finished tx with 5 minutes on Nustep and pt denied pain at end of tx.     PT Next Visit Plan  check HEP , may need wedge.  Flexion based strength, will probably need sitting or standing hip strength; LAQs, SAQs    PT Home Exercise Plan  knee to chest, PPT, hamstring, standing hip abd , lower trunk rotation, supine hip abd and clamshells (need to be changed to seated)    Consulted and Agree with Plan of Care  Patient       Patient will benefit  from skilled therapeutic intervention in order to improve the following deficits and impairments:     Visit Diagnosis: Chronic bilateral low back pain without sciatica  Other abnormalities of gait and mobility  Muscle weakness (generalized)     Problem List Patient Active Problem List   Diagnosis Date Noted  . Severe obesity (BMI >= 40) ([place]) 01/06/2019  . Estrogen deficiency 10/28/2018  . Benign positional vertigo 08/30/2017  . Hyperlipidemia associated with type 2 diabetes mellitus ([place]) 08/30/2017  . Acute gouty arthritis 08/30/2017  . Status post total right knee replacement 08/21/2017  . Acute blood loss as cause of postoperative anemia 02/16/2017  . GERD (gastroesophageal reflux disease) 02/16/2017  . Vitamin D deficiency 02/16/2017  . 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 Unilateral primary osteoarthritis, right knee 01/10/2017  . Unilateral primary osteoarthritis, left knee 01/10/2017  . Subclinical hypothyroidism 10/11/2016  . Decreased hearing of left ear 10/11/2016  . Diabetes mellitus type 2 in obese ([place]) 12/31/2015  . Hypertriglyceridemia 08/30/2015  . Dyspnea 04/02/2013  . Cough 02/12/2013  . Lipoma 09/30/2012  . Bilateral carpal tunnel syndrome 04/29/2012  . Abdominal pain, left lateral 04/03/2012  . Edema 08/22/2011  . OBESITY, MORBID 10/16/2007  . Essential hypertension 10/16/2007  . Osteoarthrosis involving lower leg 10/16/2007    Dorene Ar 01/21/2019, 12:27 PM  [facility] [address] [place], [place], [address] Phone: [phone]   Fax:  [phone]  Name: AFRIKA BRICK MRN: [id number] Date of Birth: January 27, 1951

## 2019-01-21 NOTE — Therapy (Signed)
[place], [place], [address] Phone: [phone]   Fax:  [phone]  Physical Therapy Treatment  Patient Details  Name: [name] MRN: [id number] Date of Birth: 1951-03-14 Referring Provider (PT): Dr. Jean Rosenthal    Encounter Date: 01/21/2019  PT End of Session - 01/21/19 1112    Visit Number  2    Number of Visits  12    Date for PT Re-Evaluation  02/27/19    PT Start Time  1001    PT Stop Time  1045    PT Time Calculation (min)  44 min    Activity Tolerance  Patient limited by pain;Patient tolerated treatment well    Behavior During Therapy  [facility] for tasks assessed/performed       Past Medical History:  Diagnosis Date  . Abdominal hernia   . Acute blood loss as cause of postoperative anemia 02/16/2017  . Arthritis    knees, back, R ankle   . Bilateral carpal tunnel syndrome 04/29/2012  . Diabetes mellitus   . Diabetes mellitus type 2 in obese ([place]) 12/31/2015  . Edema    legs  . GERD (gastroesophageal reflux disease)   . HOH (hard of hearing)   . Hypertension   . Lipoma 09/30/2012  . Obesity    morbid obesity  . OBESITY, MORBID 10/16/2007   Qualifier: Diagnosis of  By: Enrique Sack    . Osteoarthrosis involving lower leg 10/16/2007   Qualifier: Diagnosis of  By: Enrique Sack    . 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 Status post total right knee replacement 08/21/2017  . Subclinical hypothyroidism 10/11/2016  . Tachycardia 08/22/2011  . Vertigo   . Vitamin D deficiency 02/16/2017    Past Surgical History:  Procedure Laterality Date  . [place]?   right  . CARPAL TUNNEL RELEASE Left 07/10/2014   Procedure: LEFT ENDOSCOPIC CARPAL TUNNEL RELEASE ;  Surgeon: Jolyn Nap, MD;  Location: [place];  Service: Orthopedics;  Laterality: Left;  . CARPAL TUNNEL RELEASE Right   . COLONOSCOPY    . HERNIA REPAIR  07/03/2011    abd hernia  . NOSE SURGERY  2008   sinus  . TOTAL KNEE ARTHROPLASTY Left 02/06/2017   Procedure: LEFT TOTAL KNEE ARTHROPLASTY;  Surgeon: Mcarthur Rossetti, MD;  Location: [place];  Service: Orthopedics;  Laterality: Left;  . TOTAL KNEE ARTHROPLASTY Right 08/21/2017  . TOTAL KNEE ARTHROPLASTY Right 08/21/2017   Procedure: RIGHT TOTAL KNEE ARTHROPLASTY;  Surgeon: Mcarthur Rossetti, MD;  Location: [place];  Service: Orthopedics;  Laterality: Right;  . TUBAL LIGATION      There were no vitals filed for this visit.  Subjective Assessment - 01/21/19 1020    Subjective  Pt c/o constant pain and is a 9/10 today.     Currently in Pain?  Yes    Pain Score  9     Pain Location  Back    Pain Orientation  Right;Left;Lower    Pain Descriptors / Indicators  Tightness    Pain Type  Chronic pain    Aggravating Factors   standing, cold, AM    Pain Relieving Factors  meds, sitting down                       [facility] Adult PT Treatment/Exercise - 01/21/19 0001      Lumbar Exercises: Stretches   Active Hamstring  Stretch  Right;Left;10 seconds;5 reps   Cues to hold stretch   Single Knee to Chest Stretch  Right;Left;5 reps;10 seconds    Single Knee to Chest Stretch Limitations  sheet    Lower Trunk Rotation  5 reps;10 seconds    Pelvic Tilt  10 reps   Cues for ab setting and tilting     Lumbar Exercises: Supine   Clam  10 reps    Other Supine Lumbar Exercises  Chin tucks for forward head posture x10    Other Supine Lumbar Exercises  Ball squeezes x10 with 3 sec hold; Marching in hooklying 15 reps; Hip ABD 15 reps each side      Nustep L3 x 5 minutes        PT Education - 01/21/19 1101    Education Details  HEP; emphasized importance of adhering to HEP    Person(s) Educated  Patient    Methods  Explanation;Demonstration;Verbal cues    Comprehension  Verbalized understanding;Returned demonstration;Verbal cues required          PT Long Term Goals - 01/17/19 0817       PT LONG TERM GOAL #1   Title  Pt will be I with HEP for long term wellness, including trunk flexibility/stability and hip strength     Time  6    Period  Weeks    Status  New    Target Date  02/27/19      PT LONG TERM GOAL #2   Title  Pt will be able to stand for ADLs and home tasks up to 15 min with less pain overall, no more than 3/10 most of the time     Time  6    Period  Weeks    Status  New    Target Date  02/27/19      PT LONG TERM GOAL #3   Title  Pt will be able to lie on her L side for sleeping a portion of the time with min pain increase.     Time  6    Period  Weeks    Status  New    Target Date  02/27/19      PT LONG TERM GOAL #4   Title  Pt will be able to walk for 30 min in the community with pain <5/10 for shopping, errands, church .     Time  6    Period  Weeks    Status  New    Target Date  02/27/19      PT LONG TERM GOAL #5   Title  Pt will increase hip abduction strength to 3+/5 in each LE to work towards a more normalized gait pattern.     Baseline  3-/5    Time  6    Period  Weeks    Status  New    Target Date  02/27/19            Plan - 01/21/19 1113    Clinical Impression Statement  Pt c/o 9/10 pain today before tx and states that it's a constant pain. Reviewed HEP with pt, but she did not demo good recall and stated that she hasn't been doing them much because of her spasms in left quadriceps. Updated HEP with supine clamshells and abduction. Plan to transtion to more seated exercises d/t pt not preferring to be on back. Finished tx with 5 minutes on Nustep and pt denied pain at end of tx.  PT Next Visit Plan  check HEP , may need wedge.  Flexion based strength, will probably need sitting or standing hip strength; LAQs, SAQs    PT Home Exercise Plan  knee to chest, PPT, hamstring, standing hip abd , lower trunk rotation, supine hip abd and clamshells (need to be changed to seated)    Consulted and Agree with Plan of Care  Patient       During this treatment session, the therapist was present, participating in and directing the treatment.  Patient will benefit from skilled therapeutic intervention in order to improve the following deficits and impairments:     Visit Diagnosis: Chronic bilateral low back pain without sciatica  Other abnormalities of gait and mobility  Muscle weakness (generalized)     Problem List Patient Active Problem List   Diagnosis Date Noted  . Severe obesity (BMI >= 40) ([place]) 01/06/2019  . Estrogen deficiency 10/28/2018  . Benign positional vertigo 08/30/2017  . Hyperlipidemia associated with type 2 diabetes mellitus ([place]) 08/30/2017  . Acute gouty arthritis 08/30/2017  . Status post total right knee replacement 08/21/2017  . Acute blood loss as cause of postoperative anemia 02/16/2017  . GERD (gastroesophageal reflux disease) 02/16/2017  . Vitamin D deficiency 02/16/2017  . 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 Unilateral primary osteoarthritis, right knee 01/10/2017  . Unilateral primary osteoarthritis, left knee 01/10/2017  . Subclinical hypothyroidism 10/11/2016  . Decreased hearing of left ear 10/11/2016  . Diabetes mellitus type 2 in obese ([place]) 12/31/2015  . Hypertriglyceridemia 08/30/2015  . Dyspnea 04/02/2013  . Cough 02/12/2013  . Lipoma 09/30/2012  . Bilateral carpal tunnel syndrome 04/29/2012  . Abdominal pain, left lateral 04/03/2012  . Edema 08/22/2011  . OBESITY, MORBID 10/16/2007  . Essential hypertension 10/16/2007  . Osteoarthrosis involving lower leg 10/16/2007    Fuller Mandril, [address] [place] , [place] 01/21/2019, 12:26 PM  [facility] [address] [place], [place], [address] Phone: [phone]   Fax:  [phone]  Name: [name] MRN: [id number] Date of Birth: [birth year]

## 2019-01-23 ENCOUNTER — Ambulatory Visit: Payer: Medicare Other | Admitting: Physical Therapy

## 2019-01-23 ENCOUNTER — Encounter: Payer: Self-pay | Admitting: Physical Therapy

## 2019-01-23 DIAGNOSIS — G8929 Other chronic pain: Secondary | ICD-10-CM

## 2019-01-23 DIAGNOSIS — M6281 Muscle weakness (generalized): Secondary | ICD-10-CM

## 2019-01-23 DIAGNOSIS — M545 Low back pain, unspecified: Secondary | ICD-10-CM

## 2019-01-23 DIAGNOSIS — R2689 Other abnormalities of gait and mobility: Secondary | ICD-10-CM

## 2019-01-23 NOTE — Therapy (Signed)
[place], [place], [address] Phone: [phone]   Fax:  [phone]  Physical Therapy Treatment  Patient Details  Name: [name] MRN: [id number] Date of Birth: 1950-12-28 Referring Provider (PT): Dr. Jean Rosenthal    Encounter Date: 01/23/2019  PT End of Session - 01/23/19 1019    Visit Number  3    Number of Visits  12    Date for PT Re-Evaluation  02/27/19    PT Start Time  1015    PT Stop Time  1059    PT Time Calculation (min)  44 min    Activity Tolerance  Patient tolerated treatment well    Behavior During Therapy  [facility] for tasks assessed/performed       Past Medical History:  Diagnosis Date  . Abdominal hernia   . Acute blood loss as cause of postoperative anemia 02/16/2017  . Arthritis    knees, back, R ankle   . Bilateral carpal tunnel syndrome 04/29/2012  . Diabetes mellitus   . Diabetes mellitus type 2 in obese ([place]) 12/31/2015  . Edema    legs  . GERD (gastroesophageal reflux disease)   . HOH (hard of hearing)   . Hypertension   . Lipoma 09/30/2012  . Obesity    morbid obesity  . OBESITY, MORBID 10/16/2007   Qualifier: Diagnosis of  By: Enrique Sack    . Osteoarthrosis involving lower leg 10/16/2007   Qualifier: Diagnosis of  By: Enrique Sack    . 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 Status post total right knee replacement 08/21/2017  . Subclinical hypothyroidism 10/11/2016  . Tachycardia 08/22/2011  . Vertigo   . Vitamin D deficiency 02/16/2017    Past Surgical History:  Procedure Laterality Date  . [place]?   right  . CARPAL TUNNEL RELEASE Left 07/10/2014   Procedure: LEFT ENDOSCOPIC CARPAL TUNNEL RELEASE ;  Surgeon: Jolyn Nap, MD;  Location: [place];  Service: Orthopedics;  Laterality: Left;  . CARPAL TUNNEL RELEASE Right   . COLONOSCOPY    . HERNIA REPAIR  07/03/2011   abd hernia  . NOSE  SURGERY  2008   sinus  . TOTAL KNEE ARTHROPLASTY Left 02/06/2017   Procedure: LEFT TOTAL KNEE ARTHROPLASTY;  Surgeon: Mcarthur Rossetti, MD;  Location: [place];  Service: Orthopedics;  Laterality: Left;  . TOTAL KNEE ARTHROPLASTY Right 08/21/2017  . TOTAL KNEE ARTHROPLASTY Right 08/21/2017   Procedure: RIGHT TOTAL KNEE ARTHROPLASTY;  Surgeon: Mcarthur Rossetti, MD;  Location: [place];  Service: Orthopedics;  Laterality: Right;  . TUBAL LIGATION      There were no vitals filed for this visit.  Subjective Assessment - 01/23/19 1018    Subjective  No pain right now.  Was a 9/10 last night.     Currently in Pain?  No/denies         [facility] Adult PT Treatment/Exercise - 01/23/19 0001      Lumbar Exercises: Stretches   Active Hamstring Stretch  3 reps;20 seconds    Active Hamstring Stretch Limitations  sheet    Single Knee to Chest Stretch Limitations  did not feel stretch     Lower Trunk Rotation  10 seconds    Lower Trunk Rotation Limitations  x 10     Pelvic Tilt  10 reps   Cues for ab setting and tilting   Other Lumbar Stretch Exercise  standing trunk rotation  and sidebending, end of session       Lumbar Exercises: Aerobic   Nustep  L5 UE and LE 8 min       Lumbar Exercises: Standing   Other Standing Lumbar Exercises  standing core press with ball bilateral shoulder    x 10      Lumbar Exercises: Seated   Other Seated Lumbar Exercises  ball roll x 10 flexion for stretching     Other Seated Lumbar Exercises  horizontal pull red band, unable to get full ROM x 10       Lumbar Exercises: Supine   Clam  15 reps    Clam Limitations  used green band , cramping a bit Rt LE     Bent Knee Raise  10 reps    Bent Knee Raise Limitations  green band              PT Education - 01/23/19 1051    Education Details  exercise cueing, core     Person(s) Educated  Patient    Methods  Explanation;Demonstration;Verbal cues    Comprehension  Verbalized understanding           PT Long Term Goals - 01/17/19 0817      PT LONG TERM GOAL #1   Title  Pt will be I with HEP for long term wellness, including trunk flexibility/stability and hip strength     Time  6    Period  Weeks    Status  New    Target Date  02/27/19      PT LONG TERM GOAL #2   Title  Pt will be able to stand for ADLs and home tasks up to 15 min with less pain overall, no more than 3/10 most of the time     Time  6    Period  Weeks    Status  New    Target Date  02/27/19      PT LONG TERM GOAL #3   Title  Pt will be able to lie on her L side for sleeping a portion of the time with min pain increase.     Time  6    Period  Weeks    Status  New    Target Date  02/27/19      PT LONG TERM GOAL #4   Title  Pt will be able to walk for 30 min in the community with pain <5/10 for shopping, errands, church .     Time  6    Period  Weeks    Status  New    Target Date  02/27/19      PT LONG TERM GOAL #5   Title  Pt will increase hip abduction strength to 3+/5 in each LE to work towards a more normalized gait pattern.     Baseline  3-/5    Time  6    Period  Weeks    Status  New    Target Date  02/27/19            Plan - 01/23/19 1019    Clinical Impression Statement  No pain today during session.  Pt with abdominal cramping after prolonged abdominal set.  Difficulty with standing hip exercises.  Limited exercise capacity but worked hard during session.     PT Treatment/Interventions  ADLs/Self Care Home Management;Cryotherapy;Balance training;Electrical Stimulation;Moist Heat;Gait training;Therapeutic exercise;Therapeutic activities;Functional mobility training;Neuromuscular re-education;Manual techniques;Patient/family education    PT Next Visit Plan  check HEP , may need wedge.  Flexion based strength, will probably need sitting or standing hip strength; LAQs, SAQs    PT Home Exercise Plan  knee to chest, PPT, hamstring, standing hip abd , lower trunk rotation, supine hip abd  and clamshells (need to be changed to seated)    Consulted and Agree with Plan of Care  Patient       Patient will benefit from skilled therapeutic intervention in order to improve the following deficits and impairments:  Abnormal gait, Decreased balance, Decreased endurance, Decreased mobility, Difficulty walking, Obesity, Improper body mechanics, Decreased range of motion, Decreased activity tolerance, Decreased strength, Increased fascial restricitons, Impaired UE functional use, Pain, Postural dysfunction  Visit Diagnosis: Chronic bilateral low back pain without sciatica  Other abnormalities of gait and mobility  Muscle weakness (generalized)     Problem List Patient Active Problem List   Diagnosis Date Noted  . Severe obesity (BMI >= 40) ([place]) 01/06/2019  . Estrogen deficiency 10/28/2018  . Benign positional vertigo 08/30/2017  . Hyperlipidemia associated with type 2 diabetes mellitus ([place]) 08/30/2017  . Acute gouty arthritis 08/30/2017  . Status post total right knee replacement 08/21/2017  . Acute blood loss as cause of postoperative anemia 02/16/2017  . GERD (gastroesophageal reflux disease) 02/16/2017  . Vitamin D deficiency 02/16/2017  . 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 Unilateral primary osteoarthritis, right knee 01/10/2017  . Unilateral primary osteoarthritis, left knee 01/10/2017  . Subclinical hypothyroidism 10/11/2016  . Decreased hearing of left ear 10/11/2016  . Diabetes mellitus type 2 in obese ([place]) 12/31/2015  . Hypertriglyceridemia 08/30/2015  . Dyspnea 04/02/2013  . Cough 02/12/2013  . Lipoma 09/30/2012  . Bilateral carpal tunnel syndrome 04/29/2012  . Abdominal pain, left lateral 04/03/2012  . Edema 08/22/2011  . OBESITY, MORBID 10/16/2007  . Essential hypertension 10/16/2007  . Osteoarthrosis involving lower leg 10/16/2007    Talon Witting 01/23/2019, 11:03 AM  [facility] [address] [place], [place], [address] Phone: [phone]   Fax:  [phone]  Name: [name] MRN: [id number] Date of Birth: [birth year]  Raeford Razor, PT 01/23/19 11:03 AM Phone: [phone] Fax: [phone]

## 2019-01-27 ENCOUNTER — Ambulatory Visit: Payer: Medicare Other | Admitting: Physical Therapy

## 2019-01-27 ENCOUNTER — Encounter: Payer: Self-pay | Admitting: Physical Therapy

## 2019-01-27 DIAGNOSIS — G8929 Other chronic pain: Secondary | ICD-10-CM

## 2019-01-27 DIAGNOSIS — M545 Low back pain, unspecified: Secondary | ICD-10-CM

## 2019-01-27 DIAGNOSIS — M6281 Muscle weakness (generalized): Secondary | ICD-10-CM

## 2019-01-27 DIAGNOSIS — R2689 Other abnormalities of gait and mobility: Secondary | ICD-10-CM

## 2019-01-27 NOTE — Therapy (Signed)
[place], [place], [address] Phone: [phone]   Fax:  [phone]  Physical Therapy Treatment  Patient Details  Name: [name] MRN: [id number] Date of Birth: 10-21-1951 Referring Provider (PT): Dr. Jean Rosenthal    Encounter Date: 01/27/2019  PT End of Session - 01/27/19 1227    Visit Number  4    Number of Visits  12    Date for PT Re-Evaluation  02/27/19    PT Start Time  6734    PT Stop Time  1226    PT Time Calculation (min)  42 min    Activity Tolerance  Patient tolerated treatment well    Behavior During Therapy  [facility] for tasks assessed/performed       Past Medical History:  Diagnosis Date  . Abdominal hernia   . Acute blood loss as cause of postoperative anemia 02/16/2017  . Arthritis    knees, back, R ankle   . Bilateral carpal tunnel syndrome 04/29/2012  . Diabetes mellitus   . Diabetes mellitus type 2 in obese ([place]) 12/31/2015  . Edema    legs  . GERD (gastroesophageal reflux disease)   . HOH (hard of hearing)   . Hypertension   . Lipoma 09/30/2012  . Obesity    morbid obesity  . OBESITY, MORBID 10/16/2007   Qualifier: Diagnosis of  By: Enrique Sack    . Osteoarthrosis involving lower leg 10/16/2007   Qualifier: Diagnosis of  By: Enrique Sack    . 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 Status post total right knee replacement 08/21/2017  . Subclinical hypothyroidism 10/11/2016  . Tachycardia 08/22/2011  . Vertigo   . Vitamin D deficiency 02/16/2017    Past Surgical History:  Procedure Laterality Date  . [place]?   right  . CARPAL TUNNEL RELEASE Left 07/10/2014   Procedure: LEFT ENDOSCOPIC CARPAL TUNNEL RELEASE ;  Surgeon: Jolyn Nap, MD;  Location: [place];  Service: Orthopedics;  Laterality: Left;  . CARPAL TUNNEL RELEASE Right   . COLONOSCOPY    . HERNIA REPAIR  07/03/2011   abd hernia  . NOSE  SURGERY  2008   sinus  . TOTAL KNEE ARTHROPLASTY Left 02/06/2017   Procedure: LEFT TOTAL KNEE ARTHROPLASTY;  Surgeon: Mcarthur Rossetti, MD;  Location: [place];  Service: Orthopedics;  Laterality: Left;  . TOTAL KNEE ARTHROPLASTY Right 08/21/2017  . TOTAL KNEE ARTHROPLASTY Right 08/21/2017   Procedure: RIGHT TOTAL KNEE ARTHROPLASTY;  Surgeon: Mcarthur Rossetti, MD;  Location: [place];  Service: Orthopedics;  Laterality: Right;  . TUBAL LIGATION      There were no vitals filed for this visit.  Subjective Assessment - 01/27/19 1148    Subjective  Pt reports taking two Aleve every day, but not having any pain so far today. Pt denies any soreness or pain after last session.     Currently in Pain?  No/denies                       [facility] Adult PT Treatment/Exercise - 01/27/19 0001      Self-Care   Self-Care  ADL's    ADL's  Propping up foot in cabinet while doing dishes to reduce back pain      Lumbar Exercises: Aerobic   Nustep  L5 UE and LE 6 min       Lumbar Exercises: Standing   Other Standing  Lumbar Exercises  3 way hip 10x each way; both LEs   Noted rt foot pronates during SLS   Other Standing Lumbar Exercises  marches at counter      Lumbar Exercises: [place] on Chair  Strengthening;Both;10 reps    Other Seated Lumbar Exercises  scap retractions 3 sets of 10; started with single scap retractions for learning purposes   Heavy cues for scapular movement (tactile and verbal)   Other Seated Lumbar Exercises  horizontal pull red band, unable to get full ROM x 10    Cues for scapular retraction and depression            PT Education - 01/27/19 1234    Education Details  HEP, cueing, self care while doing dishes    Person(s) Educated  Patient    Methods  Explanation;Demonstration;Verbal cues;Handout    Comprehension  Verbalized understanding;Returned demonstration          PT Long Term Goals - 01/17/19 0817      PT LONG TERM GOAL  #1   Title  Pt will be I with HEP for long term wellness, including trunk flexibility/stability and hip strength     Time  6    Period  Weeks    Status  New    [place] Date  02/27/19      PT LONG TERM GOAL #2   Title  Pt will be able to stand for ADLs and home tasks up to 15 min with less pain overall, no more than 3/10 most of the time     Time  6    Period  Weeks    Status  New    [place] Date  02/27/19      PT LONG TERM GOAL #3   Title  Pt will be able to lie on her L side for sleeping a portion of the time with min pain increase.     Time  6    Period  Weeks    Status  New    [place] Date  02/27/19      PT LONG TERM GOAL #4   Title  Pt will be able to walk for 30 min in the community with pain <5/10 for shopping, errands, church .     Time  6    Period  Weeks    Status  New    [place] Date  02/27/19      PT LONG TERM GOAL #5   Title  Pt will increase hip abduction strength to 3+/5 in each LE to work towards a more normalized gait pattern.     Baseline  3-/5    Time  6    Period  Weeks    Status  New    Target Date  02/27/19            Plan - 01/27/19 1228    Clinical Impression Statement  Pt reported no pain today and feeling good after last treatment. Updated pt's HEP with seated exercises and stretches. Pt said she would definitely do these since she didn't have to be on her back. Educated pt on proper positioning when doing dishes to alleviate her back pain.     PT Next Visit Plan  check HEP , may need wedge.  Flexion based strength, will probably need sitting or standing hip strength; cont 3 way hip and LAQs; SAQs; review scap squeezes    PT Home Exercise Plan  knee to  chest, PPT, hamstring (seated), standing hip abd , lower trunk rotation, supine hip abd and clamshells; LAQs, scap squeezes    Consulted and Agree with Plan of Care  Patient     During this treatment session, the therapist was present, participating in and directing the treatment.  Patient will  benefit from skilled therapeutic intervention in order to improve the following deficits and impairments:  Abnormal gait, Decreased balance, Decreased endurance, Decreased mobility, Difficulty walking, Obesity, Improper body mechanics, Decreased range of motion, Decreased activity tolerance, Decreased strength, Increased fascial restricitons, Impaired UE functional use, Pain, Postural dysfunction  Visit Diagnosis: Chronic bilateral low back pain without sciatica  Other abnormalities of gait and mobility  Muscle weakness (generalized)     Problem List Patient Active Problem List   Diagnosis Date Noted  . Severe obesity (BMI >= 40) ([place]) 01/06/2019  . Estrogen deficiency 10/28/2018  . Benign positional vertigo 08/30/2017  . Hyperlipidemia associated with type 2 diabetes mellitus ([place]) 08/30/2017  . Acute gouty arthritis 08/30/2017  . Status post total right knee replacement 08/21/2017  . Acute blood loss as cause of postoperative anemia 02/16/2017  . GERD (gastroesophageal reflux disease) 02/16/2017  . Vitamin D deficiency 02/16/2017  . 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 Unilateral primary osteoarthritis, right knee 01/10/2017  . Unilateral primary osteoarthritis, left knee 01/10/2017  . Subclinical hypothyroidism 10/11/2016  . Decreased hearing of left ear 10/11/2016  . Diabetes mellitus type 2 in obese ([place]) 12/31/2015  . Hypertriglyceridemia 08/30/2015  . Dyspnea 04/02/2013  . Cough 02/12/2013  . Lipoma 09/30/2012  . Bilateral carpal tunnel syndrome 04/29/2012  . Abdominal pain, left lateral 04/03/2012  . Edema 08/22/2011  . OBESITY, MORBID 10/16/2007  . Essential hypertension 10/16/2007  . Osteoarthrosis involving lower leg 10/16/2007    Fuller Mandril, SPTA 01/27/2019, 12:37 PM   Hessie Diener, PTA 01/27/19 12:53 PM Phone: [phone] Fax: [facility] [facility] [address] [place], [place], [address] Phone: [phone]   Fax:  [phone]  Name: [name] MRN: [id number] Date of Birth: March 21, 1951

## 2019-01-28 ENCOUNTER — Other Ambulatory Visit: Payer: Self-pay | Admitting: Family Medicine

## 2019-01-29 ENCOUNTER — Encounter: Payer: Self-pay | Admitting: Physical Therapy

## 2019-01-29 ENCOUNTER — Ambulatory Visit: Payer: Medicare Other | Admitting: Physical Therapy

## 2019-01-29 DIAGNOSIS — G8929 Other chronic pain: Secondary | ICD-10-CM | POA: Diagnosis not present

## 2019-01-29 DIAGNOSIS — M6281 Muscle weakness (generalized): Secondary | ICD-10-CM

## 2019-01-29 DIAGNOSIS — M545 Low back pain, unspecified: Secondary | ICD-10-CM

## 2019-01-29 DIAGNOSIS — R2689 Other abnormalities of gait and mobility: Secondary | ICD-10-CM | POA: Diagnosis not present

## 2019-01-29 NOTE — Therapy (Signed)
[place], [place], [address] Phone: [phone]   Fax:  [phone]  Physical Therapy Treatment  Patient Details  Name: [name] MRN: [id number] Date of Birth: 1951-01-23 Referring Provider (PT): Dr. Jean Rosenthal    Encounter Date: 01/29/2019  PT End of Session - 01/29/19 1154    Visit Number  5    Number of Visits  12    Date for PT Re-Evaluation  02/27/19    PT Start Time  1148    PT Stop Time  1228    PT Time Calculation (min)  40 min    Activity Tolerance  Patient tolerated treatment well    Behavior During Therapy  [facility] for tasks assessed/performed       Past Medical History:  Diagnosis Date  . Abdominal hernia   . Acute blood loss as cause of postoperative anemia 02/16/2017  . Arthritis    knees, back, R ankle   . Bilateral carpal tunnel syndrome 04/29/2012  . Diabetes mellitus   . Diabetes mellitus type 2 in obese ([place]) 12/31/2015  . Edema    legs  . GERD (gastroesophageal reflux disease)   . HOH (hard of hearing)   . Hypertension   . Lipoma 09/30/2012  . Obesity    morbid obesity  . OBESITY, MORBID 10/16/2007   Qualifier: Diagnosis of  By: Enrique Sack    . Osteoarthrosis involving lower leg 10/16/2007   Qualifier: Diagnosis of  By: Enrique Sack    . 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 Status post total right knee replacement 08/21/2017  . Subclinical hypothyroidism 10/11/2016  . Tachycardia 08/22/2011  . Vertigo   . Vitamin D deficiency 02/16/2017    Past Surgical History:  Procedure Laterality Date  . [place]?   right  . CARPAL TUNNEL RELEASE Left 07/10/2014   Procedure: LEFT ENDOSCOPIC CARPAL TUNNEL RELEASE ;  Surgeon: Jolyn Nap, MD;  Location: [place];  Service: Orthopedics;  Laterality: Left;  . CARPAL TUNNEL RELEASE Right   . COLONOSCOPY    . HERNIA REPAIR  07/03/2011   abd hernia  . NOSE  SURGERY  2008   sinus  . TOTAL KNEE ARTHROPLASTY Left 02/06/2017   Procedure: LEFT TOTAL KNEE ARTHROPLASTY;  Surgeon: Mcarthur Rossetti, MD;  Location: [place];  Service: Orthopedics;  Laterality: Left;  . TOTAL KNEE ARTHROPLASTY Right 08/21/2017  . TOTAL KNEE ARTHROPLASTY Right 08/21/2017   Procedure: RIGHT TOTAL KNEE ARTHROPLASTY;  Surgeon: Mcarthur Rossetti, MD;  Location: [place];  Service: Orthopedics;  Laterality: Right;  . TUBAL LIGATION      There were no vitals filed for this visit.  Subjective Assessment - 01/29/19 1151    Subjective  Pt reports having an insidious cramp in her left quad when trying to get up from the commode and getting out of bed this morning. Pt denies pain. Pt reports she took her two Aleve this morning before treatment and tried the seated HEP.     Currently in Pain?  No/denies    Aggravating Factors   standing, cold weather, AM    Pain Relieving Factors  Aleve, sitting down    Effect of Pain on Daily Activities  limitis participation in [place], cleaning, etc.                       [facility] Adult PT Treatment/Exercise - 01/29/19 0001  Lumbar Exercises: Aerobic   Nustep  L6 UE and LE 6 min       Lumbar Exercises: Standing   Functional Squats  --   Partial squats with side stepping at counter; x2   Row  Strengthening;Both;15 reps;Theraband    Theraband Level (Row)  Level 2 (Red)   Cues for scapular squeezing   Row Limitations  Attempted rows with ABD, but pt reported cervical pain and exercise stopped.   Instructed pt in upper trap/levator stretch for pain relief   Other Standing Lumbar Exercises  3 way hip 10x each way; both LEs   Noted rt foot pronates during SLS   Other Standing Lumbar Exercises  FWD step ups x15; Lateral step ups x15; each side; 2"    Cues for upright posture and not locking the knee out     Lumbar Exercises: Seated   Long Arc Quad on Chair  Strengthening;Both;10 reps   Each side on dynamic disc    Other Seated Lumbar Exercises  Ball squeezes x10 with 3-5 sec hold                  PT Long Term Goals - 01/29/19 1244      PT LONG TERM GOAL #1   Title  Pt will be I with HEP for long term wellness, including trunk flexibility/stability and hip strength     Time  6    Period  Weeks    Status  On-going      PT LONG TERM GOAL #2   Title  Pt will be able to stand for ADLs and home tasks up to 15 min with less pain overall, no more than 3/10 most of the time     Time  6    Period  Weeks    Status  On-going      PT LONG TERM GOAL #3   Title  Pt will be able to lie on her L side for sleeping Kaylee portion of the time with min pain increase.     Time  6    Period  Weeks    Status  On-going      PT LONG TERM GOAL #4   Title  Pt will be able to walk for 30 min in the community with pain <5/10 for shopping, errands, church .     Time  6    Period  Weeks    Status  On-going      PT LONG TERM GOAL #5   Title  Pt will increase hip abduction strength to 3+/5 in each LE to work towards Kaylee more normalized gait pattern.     Baseline  3-/5    Time  6    Period  Weeks    Status  On-going            Plan - 01/29/19 1238    Clinical Impression Statement  Pt denies pain today and tolerated tx well. Pt stated she had trouble with stair navigation when participating in [place], so pt performed step ups today, along with side stepping to increase overall hip strength. Performed standing rows with red band to improve posture. Pt reported some cervical pain after attempting rows with abduction, but pain subsided within several minutes and pt was pain free at end of treatment. Pt reported sometimes her neck will cramp up when doing certain activities and said moving her neck usually helps the pain subside. Instructed pt through cervical motions with upper trap  and levator stretch. Updated HEP.     PT Treatment/Interventions  ADLs/Self Care Home Management;Cryotherapy;Balance  training;Electrical Stimulation;Moist Heat;Gait training;Therapeutic exercise;Therapeutic activities;Functional mobility training;Neuromuscular re-education;Manual techniques;Patient/family education    PT Next Visit Plan  check HEP , may need wedge.  Flexion based strength, will probably need sitting or standing hip strength; cont 3 way hip and LAQs; SAQs; no rows with abduction, address core stabilization    PT Home Exercise Plan  knee to chest, PPT, hamstring (seated), standing hip abd , lower trunk rotation, supine hip abd and clamshells; LAQs, scap squeezes, pillow squeezes    Consulted and Agree with Plan of Care  Patient     During this treatment session, the therapist was present, participating in and directing the treatment.  Patient will benefit from skilled therapeutic intervention in order to improve the following deficits and impairments:     Visit Diagnosis: Chronic bilateral low back pain without sciatica  Other abnormalities of gait and mobility  Muscle weakness (generalized)     Problem List Patient Active Problem List   Diagnosis Date Noted  . Severe obesity (BMI >= 40) ([place]) 01/06/2019  . Estrogen deficiency 10/28/2018  . Benign positional vertigo 08/30/2017  . Hyperlipidemia associated with type 2 diabetes mellitus ([place]) 08/30/2017  . Acute gouty arthritis 08/30/2017  . Status post total right knee replacement 08/21/2017  . Acute blood loss as cause of postoperative anemia 02/16/2017  . GERD (gastroesophageal reflux disease) 02/16/2017  . Vitamin D deficiency 02/16/2017  . 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 Unilateral primary osteoarthritis, right knee 01/10/2017  . Unilateral primary osteoarthritis, left knee 01/10/2017  . Subclinical hypothyroidism 10/11/2016  . Decreased hearing of left ear 10/11/2016  . Diabetes mellitus type 2 in obese ([place]) 12/31/2015  . Hypertriglyceridemia 08/30/2015  . Dyspnea 04/02/2013  . Cough 02/12/2013  .  Lipoma 09/30/2012  . Bilateral carpal tunnel syndrome 04/29/2012  . Abdominal pain, left lateral 04/03/2012  . Edema 08/22/2011  . OBESITY, MORBID 10/16/2007  . Essential hypertension 10/16/2007  . Osteoarthrosis involving lower leg 10/16/2007   Fuller Mandril, [address] [place] 01/29/19 1:19 PM Phone: [phone] Fax: [place] [facility] [address] [place], [place], [address] Phone: [phone]   Fax:  [phone]  Name: AVALEEN Barnett MRN: [id number] Date of Birth: 1951/01/06

## 2019-02-03 ENCOUNTER — Encounter: Payer: Self-pay | Admitting: Physical Therapy

## 2019-02-03 ENCOUNTER — Ambulatory Visit: Payer: Medicare Other | Admitting: Physical Therapy

## 2019-02-03 DIAGNOSIS — R2689 Other abnormalities of gait and mobility: Secondary | ICD-10-CM

## 2019-02-03 DIAGNOSIS — M6281 Muscle weakness (generalized): Secondary | ICD-10-CM

## 2019-02-03 DIAGNOSIS — G8929 Other chronic pain: Secondary | ICD-10-CM | POA: Diagnosis not present

## 2019-02-03 DIAGNOSIS — M545 Low back pain, unspecified: Secondary | ICD-10-CM

## 2019-02-03 NOTE — Patient Instructions (Addendum)
Copyright  VHI. All rights reserved.   Prepared By: [place]  [place], [place]  Step 1  Step 2  Seated Hamstring Stretch reps: 3  sets: 1  hold: 30  daily: 1  weekly: 7 Setup  Begin sitting upright with one leg straight forward and your heel resting on the ground. Movement  Bend your trunk forward, hinging at your hips until you feel a stretch in the back of your leg. Hold this position.  Tip  Make sure to keep your knee straight during the stretch and do not let your back arch or slump. Step 1  Step 2  Standing Hip Abduction  reps: 10  sets: 2  hold: 3  daily: 1  weekly: 7 Setup  Begin in a standing upright position holding onto a chair at your side for support.  Movement  Lift your leg out to your side, then return to the starting position and repeat.  Tip  Make sure to keep your moving leg straight and do not bend or rotate your trunk during the exercise. Step 1  Step 2  Standing Hip Flexion reps: 10  sets: 2  hold: 3  daily: 1  weekly: 7 Setup  Begin in a standing upright position holding on to a stable object for support. Movement  Lift your leg off the ground with your knee bent, then slowly return to the starting position and repeat. Tip  Make sure to keep your back straight, hips level, and maintain your balance during the exercise. Step 1  Step 2  Standing Hip Extension reps: 10  sets: 2  hold: 3  daily: 1  weekly: 7 Setup  Begin in a standing upright position holding on to a stable object for support. Movement  Lift one leg backward, then slowly return to the starting position and repeat. Tip  Make sure to keep your back straight and maintain your balance during the exercise. Step 1  Step 2  Seated Shoulder Horizontal Abduction with Resistance reps: 10  sets: 2  hold: 3  daily: 1  weekly: 7 Setup  Begin sitting upright, holding a resistance band in both hands.  Movement  Raise your arms straight forward, palms facing  down. Pull your hands apart, squeezing your shoulder blades together, then slowly return to the starting position and repeat.  Tip  Make sure to maintain good posture and do not shrug your shoulders during the exercise. Disclaimer: This program provides exercises related to your condition that you can perform at home. As there is a risk of injury with any activity, use caution when performing exercises. If you experience any pain or discomfort, discontinue the exercises and contact your health care provider.  Login URL: [email or web address] Access Code: [email or web address] . Date printed: 02/03/2019 Page 1  Step 1  Step 2  Seated Hip Abduction reps: 10  sets: 2  hold: 3  daily: 1  weekly: 7 Setup  Begin sitting upright in a chair. Movement  Push your legs outward, keeping your feet flat on the ground, then slowly bring them back together and repeat. Tip  Make sure to keep your movements slow and controlled and continue breathing evenly during the exercise.

## 2019-02-03 NOTE — Therapy (Signed)
[place], [place], [address] Phone: [phone]   Fax:  [phone]  Physical Therapy Treatment  Patient Details  Name: [name] MRN: [id number] Date of Birth: 1951-08-17 Referring Provider (PT): Dr. Jean Rosenthal    Encounter Date: 02/03/2019  PT End of Session - 02/03/19 1338    Visit Number  6    Number of Visits  12    Date for PT Re-Evaluation  02/27/19    PT Start Time  1332    PT Stop Time  1418    PT Time Calculation (min)  46 min    Activity Tolerance  Patient tolerated treatment well    Behavior During Therapy  [facility] for tasks assessed/performed       Past Medical History:  Diagnosis Date  . Abdominal hernia   . Acute blood loss as cause of postoperative anemia 02/16/2017  . Arthritis    knees, back, R ankle   . Bilateral carpal tunnel syndrome 04/29/2012  . Diabetes mellitus   . Diabetes mellitus type 2 in obese ([place]) 12/31/2015  . Edema    legs  . GERD (gastroesophageal reflux disease)   . HOH (hard of hearing)   . Hypertension   . Lipoma 09/30/2012  . Obesity    morbid obesity  . OBESITY, MORBID 10/16/2007   Qualifier: Diagnosis of  By: Enrique Sack    . Osteoarthrosis involving lower leg 10/16/2007   Qualifier: Diagnosis of  By: Enrique Sack    . 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 Status post total right knee replacement 08/21/2017  . Subclinical hypothyroidism 10/11/2016  . Tachycardia 08/22/2011  . Vertigo   . Vitamin D deficiency 02/16/2017    Past Surgical History:  Procedure Laterality Date  . [place]?   right  . CARPAL TUNNEL RELEASE Left 07/10/2014   Procedure: LEFT ENDOSCOPIC CARPAL TUNNEL RELEASE ;  Surgeon: Jolyn Nap, MD;  Location: [place];  Service: Orthopedics;  Laterality: Left;  . CARPAL TUNNEL RELEASE Right   . COLONOSCOPY    . HERNIA REPAIR  07/03/2011   abd hernia  . NOSE  SURGERY  2008   sinus  . TOTAL KNEE ARTHROPLASTY Left 02/06/2017   Procedure: LEFT TOTAL KNEE ARTHROPLASTY;  Surgeon: Mcarthur Rossetti, MD;  Location: [place];  Service: Orthopedics;  Laterality: Left;  . TOTAL KNEE ARTHROPLASTY Right 08/21/2017  . TOTAL KNEE ARTHROPLASTY Right 08/21/2017   Procedure: RIGHT TOTAL KNEE ARTHROPLASTY;  Surgeon: Mcarthur Rossetti, MD;  Location: [place];  Service: Orthopedics;  Laterality: Right;  . TUBAL LIGATION      There were no vitals filed for this visit.  Subjective Assessment - 02/03/19 1336    Subjective  Pt not having any pain today.  Had some this AM.  No LE cramping.  I don't like laying down for exercise.     Currently in Pain?  No/denies           [facility] Adult PT Treatment/Exercise - 02/03/19 0001      Self-Care   Other Self-Care Comments   aquatics, HEP, POSTURE       Lumbar Exercises: Stretches   Active Hamstring Stretch  Right;Left;3 reps;30 seconds    Pelvic Tilt  10 reps    Pelvic Tilt Limitations  seated     Other Lumbar Stretch Exercise  seated lumbar flexion on large ball x 10     Other  Lumbar Stretch Exercise  wall slide both arms sliding up for relative lumbar extension       Lumbar Exercises: Aerobic   Nustep  L6 UE and LE 6 min       Lumbar Exercises: Standing   Wall Slides  15 reps    Wall Slides Limitations  small ROM     Other Standing Lumbar Exercises  3 way hip bilateral x 10 with UE assist     Other Standing Lumbar Exercises  wall push ups x 10       Lumbar Exercises: Seated   Other Seated Lumbar Exercises  seated march and clam with green band x 20              PT Education - 02/03/19 1355    Education Details  HEP, posture, weighing aquatic center vs [facility] vs [place] for post PT     Person(s) Educated  Patient    Methods  Explanation;Demonstration;Handout    Comprehension  Verbalized understanding;Need further instruction          PT Long Term Goals - 01/29/19 1244      PT LONG TERM  GOAL #1   Title  Pt will be I with HEP for long term wellness, including trunk flexibility/stability and hip strength     Time  6    Period  Weeks    Status  On-going      PT LONG TERM GOAL #2   Title  Pt will be able to stand for ADLs and home tasks up to 15 min with less pain overall, no more than 3/10 most of the time     Time  6    Period  Weeks    Status  On-going      PT LONG TERM GOAL #3   Title  Pt will be able to lie on her L side for sleeping a portion of the time with min pain increase.     Time  6    Period  Weeks    Status  On-going      PT LONG TERM GOAL #4   Title  Pt will be able to walk for 30 min in the community with pain <5/10 for shopping, errands, church .     Time  6    Period  Weeks    Status  On-going      PT LONG TERM GOAL #5   Title  Pt will increase hip abduction strength to 3+/5 in each LE to work towards a more normalized gait pattern.     Baseline  3-/5    Time  6    Period  Weeks    Status  On-going            Plan - 02/03/19 1339    Clinical Impression Statement  Pt works very hard during PT.  Re-issed full HEP to be all in sitting or standing.  Cues for posture today, able to do band exervcise with UE without cramping in neck.     PT Treatment/Interventions  ADLs/Self Care Home Management;Cryotherapy;Balance training;Electrical Stimulation;Moist Heat;Gait training;Therapeutic exercise;Therapeutic activities;Functional mobility training;Neuromuscular re-education;Manual techniques;Patient/family education    PT Next Visit Plan  flexion based core (use ball, circle), standing bands     PT Home Exercise Plan  seated PPT, hamstring, 3 way hip, horiz abd red , seated clam and march green band     Consulted and Agree with Plan of Care  Patient  Patient will benefit from skilled therapeutic intervention in order to improve the following deficits and impairments:  Abnormal gait, Decreased balance, Decreased endurance, Decreased mobility,  Difficulty walking, Obesity, Improper body mechanics, Decreased range of motion, Decreased activity tolerance, Decreased strength, Increased fascial restricitons, Impaired UE functional use, Pain, Postural dysfunction  Visit Diagnosis: Chronic bilateral low back pain without sciatica  Other abnormalities of gait and mobility  Muscle weakness (generalized)     Problem List Patient Active Problem List   Diagnosis Date Noted  . Severe obesity (BMI >= 40) ([place]) 01/06/2019  . Estrogen deficiency 10/28/2018  . Benign positional vertigo 08/30/2017  . Hyperlipidemia associated with type 2 diabetes mellitus ([place]) 08/30/2017  . Acute gouty arthritis 08/30/2017  . Status post total right knee replacement 08/21/2017  . Acute blood loss as cause of postoperative anemia 02/16/2017  . GERD (gastroesophageal reflux disease) 02/16/2017  . Vitamin D deficiency 02/16/2017  . 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 Unilateral primary osteoarthritis, right knee 01/10/2017  . Unilateral primary osteoarthritis, left knee 01/10/2017  . Subclinical hypothyroidism 10/11/2016  . Decreased hearing of left ear 10/11/2016  . Diabetes mellitus type 2 in obese ([place]) 12/31/2015  . Hypertriglyceridemia 08/30/2015  . Dyspnea 04/02/2013  . Cough 02/12/2013  . Lipoma 09/30/2012  . Bilateral carpal tunnel syndrome 04/29/2012  . Abdominal pain, left lateral 04/03/2012  . Edema 08/22/2011  . OBESITY, MORBID 10/16/2007  . Essential hypertension 10/16/2007  . Osteoarthrosis involving lower leg 10/16/2007    Susette Seminara 02/03/2019, 2:24 PM  [facility] [address] [place], [place], [address] Phone: [phone]   Fax:  [phone]  Name: [name] MRN: [id number] Date of Birth: Jan 18, 1951  Raeford Razor, PT 02/03/19 2:24 PM Phone: [phone] Fax: [phone]

## 2019-02-04 DIAGNOSIS — H524 Presbyopia: Secondary | ICD-10-CM | POA: Diagnosis not present

## 2019-02-04 DIAGNOSIS — E119 Type 2 diabetes mellitus without complications: Secondary | ICD-10-CM | POA: Diagnosis not present

## 2019-02-04 DIAGNOSIS — H5203 Hypermetropia, bilateral: Secondary | ICD-10-CM | POA: Diagnosis not present

## 2019-02-04 LAB — HM DIABETES EYE EXAM

## 2019-02-05 ENCOUNTER — Ambulatory Visit: Payer: Medicare Other | Admitting: Physical Therapy

## 2019-02-05 ENCOUNTER — Encounter: Payer: Self-pay | Admitting: Physical Therapy

## 2019-02-05 DIAGNOSIS — R2689 Other abnormalities of gait and mobility: Secondary | ICD-10-CM

## 2019-02-05 DIAGNOSIS — M545 Low back pain, unspecified: Secondary | ICD-10-CM

## 2019-02-05 DIAGNOSIS — M6281 Muscle weakness (generalized): Secondary | ICD-10-CM | POA: Diagnosis not present

## 2019-02-05 DIAGNOSIS — G8929 Other chronic pain: Secondary | ICD-10-CM

## 2019-02-05 NOTE — Therapy (Signed)
[place], [place], [address] Phone: [phone]   Fax:  [phone]  Physical Therapy Treatment  Patient Details  Name: [name] MRN: [id number] Date of Birth: 24-Jun-1951 Referring Provider (PT): Dr. Jean Rosenthal    Encounter Date: 02/05/2019  PT End of Session - 02/05/19 1154    Visit Number  7    Number of Visits  12    Date for PT Re-Evaluation  02/27/19    PT Start Time  4196    PT Stop Time  1227    PT Time Calculation (min)  38 min       Past Medical History:  Diagnosis Date  . Abdominal hernia   . Acute blood loss as cause of postoperative anemia 02/16/2017  . Arthritis    knees, back, R ankle   . Bilateral carpal tunnel syndrome 04/29/2012  . Diabetes mellitus   . Diabetes mellitus type 2 in obese ([place]) 12/31/2015  . Edema    legs  . GERD (gastroesophageal reflux disease)   . HOH (hard of hearing)   . Hypertension   . Lipoma 09/30/2012  . Obesity    morbid obesity  . OBESITY, MORBID 10/16/2007   Qualifier: Diagnosis of  By: Enrique Sack    . Osteoarthrosis involving lower leg 10/16/2007   Qualifier: Diagnosis of  By: Enrique Sack    . 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 Status post total right knee replacement 08/21/2017  . Subclinical hypothyroidism 10/11/2016  . Tachycardia 08/22/2011  . Vertigo   . Vitamin D deficiency 02/16/2017    Past Surgical History:  Procedure Laterality Date  . [place]?   right  . CARPAL TUNNEL RELEASE Left 07/10/2014   Procedure: LEFT ENDOSCOPIC CARPAL TUNNEL RELEASE ;  Surgeon: Jolyn Nap, MD;  Location: [place];  Service: Orthopedics;  Laterality: Left;  . CARPAL TUNNEL RELEASE Right   . COLONOSCOPY    . HERNIA REPAIR  07/03/2011   abd hernia  . NOSE SURGERY  2008   sinus  . TOTAL KNEE ARTHROPLASTY Left 02/06/2017   Procedure: LEFT TOTAL KNEE ARTHROPLASTY;  Surgeon:  Mcarthur Rossetti, MD;  Location: [place];  Service: Orthopedics;  Laterality: Left;  . TOTAL KNEE ARTHROPLASTY Right 08/21/2017  . TOTAL KNEE ARTHROPLASTY Right 08/21/2017   Procedure: RIGHT TOTAL KNEE ARTHROPLASTY;  Surgeon: Mcarthur Rossetti, MD;  Location: [place];  Service: Orthopedics;  Laterality: Right;  . TUBAL LIGATION      There were no vitals filed for this visit.  Subjective Assessment - 02/05/19 1153    Subjective  Pt reports no pain and has not needed meds today. No pain this morning. The exercises are helping.     Currently in Pain?  No/denies                       [facility] Adult PT Treatment/Exercise - 02/05/19 0001      Lumbar Exercises: Stretches   Active Hamstring Stretch  Right;Left;3 reps;30 seconds    Pelvic Tilt  10 reps    Pelvic Tilt Limitations  seated     Other Lumbar Stretch Exercise  seated lumbar flexion on large ball x 10       Lumbar Exercises: Aerobic   Nustep  L5 UE/LE x       Lumbar Exercises: Standing   Row  20 reps    Theraband Level (  Row)  Level 3 (Green)    Shoulder Extension  20 reps;Theraband    Theraband Level (Shoulder Extension)  Level 3 (Green)    Other Standing Lumbar Exercises  3 way hip bilateral x 10 with UE in parallel bars       Lumbar Exercises: Seated   Other Seated Lumbar Exercises  seated march and clam with green band x 20                   PT Long Term Goals - 01/29/19 1244      PT LONG TERM GOAL #1   Title  Pt will be I with HEP for long term wellness, including trunk flexibility/stability and hip strength     Time  6    Period  Weeks    Status  On-going      PT LONG TERM GOAL #2   Title  Pt will be able to stand for ADLs and home tasks up to 15 min with less pain overall, no more than 3/10 most of the time     Time  6    Period  Weeks    Status  On-going      PT LONG TERM GOAL #3   Title  Pt will be able to lie on her L side for sleeping a portion of the time with min pain  increase.     Time  6    Period  Weeks    Status  On-going      PT LONG TERM GOAL #4   Title  Pt will be able to walk for 30 min in the community with pain <5/10 for shopping, errands, church .     Time  6    Period  Weeks    Status  On-going      PT LONG TERM GOAL #5   Title  Pt will increase hip abduction strength to 3+/5 in each LE to work towards a more normalized gait pattern.     Baseline  3-/5    Time  6    Period  Weeks    Status  On-going            Plan - 02/05/19 1218    Clinical Impression Statement  Pt reports improved tolerance to washing dishes/standing. She has not tried community walking or lying on left side yet. We reviewed her newest HEP and she did well, remaining on her feet for 17 minutes without rest break during session today.     PT Next Visit Plan  flexion based core (use ball, circle), standing bands ( add bands to HEP)    PT Home Exercise Plan  seated PPT, hamstring, 3 way hip, horiz abd red , seated clam and march green band     Consulted and Agree with Plan of Care  Patient       Patient will benefit from skilled therapeutic intervention in order to improve the following deficits and impairments:  Abnormal gait, Decreased balance, Decreased endurance, Decreased mobility, Difficulty walking, Obesity, Improper body mechanics, Decreased range of motion, Decreased activity tolerance, Decreased strength, Increased fascial restricitons, Impaired UE functional use, Pain, Postural dysfunction  Visit Diagnosis: Chronic bilateral low back pain without sciatica  Other abnormalities of gait and mobility  Muscle weakness (generalized)     Problem List Patient Active Problem List   Diagnosis Date Noted  . Severe obesity (BMI >= 40) ([place]) 01/06/2019  . Estrogen deficiency 10/28/2018  . Benign positional  vertigo 08/30/2017  . Hyperlipidemia associated with type 2 diabetes mellitus ([place]) 08/30/2017  . Acute gouty arthritis 08/30/2017  . Status post  total right knee replacement 08/21/2017  . Acute blood loss as cause of postoperative anemia 02/16/2017  . GERD (gastroesophageal reflux disease) 02/16/2017  . Vitamin D deficiency 02/16/2017  . 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 Unilateral primary osteoarthritis, right knee 01/10/2017  . Unilateral primary osteoarthritis, left knee 01/10/2017  . Subclinical hypothyroidism 10/11/2016  . Decreased hearing of left ear 10/11/2016  . Diabetes mellitus type 2 in obese ([place]) 12/31/2015  . Hypertriglyceridemia 08/30/2015  . Dyspnea 04/02/2013  . Cough 02/12/2013  . Lipoma 09/30/2012  . Bilateral carpal tunnel syndrome 04/29/2012  . Abdominal pain, left lateral 04/03/2012  . Edema 08/22/2011  . OBESITY, MORBID 10/16/2007  . Essential hypertension 10/16/2007  . Osteoarthrosis involving lower leg 10/16/2007    Dorene Ar, [place] 02/05/2019, 12:24 PM  [facility] [address] [place], [place], [address] Phone: [phone]   Fax:  [phone]  Name: RIGBY SWAMY MRN: [id number] Date of Birth: 23-Jun-1951

## 2019-02-10 ENCOUNTER — Ambulatory Visit: Payer: Medicare Other | Admitting: Physical Therapy

## 2019-02-10 ENCOUNTER — Encounter: Payer: Self-pay | Admitting: Internal Medicine

## 2019-02-10 ENCOUNTER — Encounter: Payer: Self-pay | Admitting: Physical Therapy

## 2019-02-10 DIAGNOSIS — M545 Low back pain, unspecified: Secondary | ICD-10-CM

## 2019-02-10 DIAGNOSIS — R2689 Other abnormalities of gait and mobility: Secondary | ICD-10-CM | POA: Diagnosis not present

## 2019-02-10 DIAGNOSIS — M6281 Muscle weakness (generalized): Secondary | ICD-10-CM | POA: Diagnosis not present

## 2019-02-10 DIAGNOSIS — G8929 Other chronic pain: Secondary | ICD-10-CM | POA: Diagnosis not present

## 2019-02-10 NOTE — Therapy (Signed)
[place], [place], [address] Phone: [phone]   Fax:  [phone]  Physical Therapy Treatment  Patient Details  Name: [name] MRN: [id number] Date of Birth: May 10, 1951 Referring Provider (PT): Dr. Jean Rosenthal    Encounter Date: 02/10/2019  PT End of Session - 02/10/19 1158    Visit Number  8    Number of Visits  12    Date for PT Re-Evaluation  02/27/19    PT Start Time  1145    PT Stop Time  1230    PT Time Calculation (min)  45 min    Activity Tolerance  Patient tolerated treatment well    Behavior During Therapy  [facility] for tasks assessed/performed       Past Medical History:  Diagnosis Date  . Abdominal hernia   . Acute blood loss as cause of postoperative anemia 02/16/2017  . Arthritis    knees, back, R ankle   . Bilateral carpal tunnel syndrome 04/29/2012  . Diabetes mellitus   . Diabetes mellitus type 2 in obese ([place]) 12/31/2015  . Edema    legs  . GERD (gastroesophageal reflux disease)   . HOH (hard of hearing)   . Hypertension   . Lipoma 09/30/2012  . Obesity    morbid obesity  . OBESITY, MORBID 10/16/2007   Qualifier: Diagnosis of  By: Enrique Sack    . Osteoarthrosis involving lower leg 10/16/2007   Qualifier: Diagnosis of  By: Enrique Sack    . 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 Status post total right knee replacement 08/21/2017  . Subclinical hypothyroidism 10/11/2016  . Tachycardia 08/22/2011  . Vertigo   . Vitamin D deficiency 02/16/2017    Past Surgical History:  Procedure Laterality Date  . [place]?   right  . CARPAL TUNNEL RELEASE Left 07/10/2014   Procedure: LEFT ENDOSCOPIC CARPAL TUNNEL RELEASE ;  Surgeon: Jolyn Nap, MD;  Location: [place];  Service: Orthopedics;  Laterality: Left;  . CARPAL TUNNEL RELEASE Right   . COLONOSCOPY    . HERNIA REPAIR  07/03/2011   abd hernia  . NOSE  SURGERY  2008   sinus  . TOTAL KNEE ARTHROPLASTY Left 02/06/2017   Procedure: LEFT TOTAL KNEE ARTHROPLASTY;  Surgeon: Mcarthur Rossetti, MD;  Location: [place];  Service: Orthopedics;  Laterality: Left;  . TOTAL KNEE ARTHROPLASTY Right 08/21/2017  . TOTAL KNEE ARTHROPLASTY Right 08/21/2017   Procedure: RIGHT TOTAL KNEE ARTHROPLASTY;  Surgeon: Mcarthur Rossetti, MD;  Location: [place];  Service: Orthopedics;  Laterality: Right;  . TUBAL LIGATION      There were no vitals filed for this visit.  Subjective Assessment - 02/10/19 1149    Subjective  Pain today in L low back 8/10.  It was a catch as I tried to turn over. Hard to get dressed.  DId about 4 hours of standing on and off Saturday, working in [employer].      Currently in Pain?  Yes    Pain Score  8     Pain Location  Back    Pain Orientation  Left;Lower    Pain Descriptors / Indicators  Tightness;Sore    Pain Type  Chronic pain    Pain Onset  More than a month ago    Pain Frequency  Intermittent    Aggravating Factors   standing     Pain Relieving Factors  [place],  sitting          [facility] PT Assessment - 02/10/19 0001      Assessment   Next MD Visit  02/17/19        [facility] Adult PT Treatment/Exercise - 02/10/19 0001      Self-Care   Other Self-Care Comments   arthritis, standing ADLs, offered handout for posture and body mechanics, POC       Lumbar Exercises: Stretches   Other Lumbar Stretch Exercise  seated lumbar flexion on large ball x 10     Other Lumbar Stretch Exercise  seated trunk rotation x 5 and sidebending over large bolster  x3 each way       Lumbar Exercises: Aerobic   Nustep  L5 UE and LE for 7 min       Lumbar Exercises: Standing   Heel Raises  10 reps    Heel Raises Limitations  2 sets, 1 in narrow "V"     Functional Squats  10 reps    Row Limitations       Shoulder Extension Limitations  red band narrow grip flexion for relative spinal extension x 10     Other Standing Lumbar Exercises  3 way  hip bilateral x 10 with UE in parallel bars    used green band                  PT Long Term Goals - 02/10/19 1222      PT LONG TERM GOAL #1   Title  Pt will be I with HEP for long term wellness, including trunk flexibility/stability and hip strength     Status  On-going      PT LONG TERM GOAL #2   Title  Pt will be able to stand for ADLs and home tasks up to 15 min with less pain overall, no more than 3/10 most of the time     Baseline  prior to Sat was "pretty good" for short times.      Status  On-going      PT LONG TERM GOAL #3   Title  Pt will be able to lie on her L side for sleeping a portion of the time with min pain increase.     Status  On-going      PT LONG TERM GOAL #4   Title  Pt will be able to walk for 30 min in the community with pain <5/10 for shopping, errands, church .     Status  On-going      PT LONG TERM GOAL #5   Title  Pt will increase hip abduction strength to 3+/5 in each LE to work towards a more normalized gait pattern.     Status  On-going            Plan - 02/10/19 1237    Clinical Impression Statement  Pt with mild flare up of back pain over the weekend.  She was feeling much better but did alot of standing.  Her FOTO score did not improve but prior to this visit she had been feeling much better. SHe made a few more appts until she sees the MD. She was encouraged to begin thinking about a plan for post DC, considering Aquatics.  No pain post session in sitting.  Standing did aggravate her back today but she can relieve it with sitting.     PT Treatment/Interventions  ADLs/Self Care Home Management;Cryotherapy;Balance training;Electrical Stimulation;Moist Heat;Gait training;Therapeutic exercise;Therapeutic activities;Functional mobility training;Neuromuscular  re-education;Manual techniques;Patient/family education    PT Next Visit Plan  flexion based core (use ball, circle), standing bands, seated core     PT Home Exercise Plan  seated  PPT, hamstring, 3 way hip, horiz abd red , seated clam and march green band , seated trunk motion     Consulted and Agree with Plan of Care  Patient       Patient will benefit from skilled therapeutic intervention in order to improve the following deficits and impairments:  Abnormal gait, Decreased balance, Decreased endurance, Decreased mobility, Difficulty walking, Obesity, Improper body mechanics, Decreased range of motion, Decreased activity tolerance, Decreased strength, Increased fascial restricitons, Impaired UE functional use, Pain, Postural dysfunction  Visit Diagnosis: Chronic bilateral low back pain without sciatica  Other abnormalities of gait and mobility  Muscle weakness (generalized)     Problem List Patient Active Problem List   Diagnosis Date Noted  . Severe obesity (BMI >= 40) ([place]) 01/06/2019  . Estrogen deficiency 10/28/2018  . Benign positional vertigo 08/30/2017  . Hyperlipidemia associated with type 2 diabetes mellitus ([place]) 08/30/2017  . Acute gouty arthritis 08/30/2017  . Status post total right knee replacement 08/21/2017  . Acute blood loss as cause of postoperative anemia 02/16/2017  . GERD (gastroesophageal reflux disease) 02/16/2017  . Vitamin D deficiency 02/16/2017  . 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 Unilateral primary osteoarthritis, right knee 01/10/2017  . Unilateral primary osteoarthritis, left knee 01/10/2017  . Subclinical hypothyroidism 10/11/2016  . Decreased hearing of left ear 10/11/2016  . Diabetes mellitus type 2 in obese ([place]) 12/31/2015  . Hypertriglyceridemia 08/30/2015  . Dyspnea 04/02/2013  . Cough 02/12/2013  . Lipoma 09/30/2012  . Bilateral carpal tunnel syndrome 04/29/2012  . Abdominal pain, left lateral 04/03/2012  . Edema 08/22/2011  . OBESITY, MORBID 10/16/2007  . Essential hypertension 10/16/2007  . Osteoarthrosis involving lower leg 10/16/2007    , 02/10/2019, 12:45 PM  [facility] [address] [place], [place], [address] Phone: [phone]   Fax:  [phone]  Name: SKYLINN VIALPANDO MRN: [id number] Date of Birth: Dec 20, 1950  Raeford Razor, PT 02/10/19 12:45 PM Phone: [phone] Fax: [phone]

## 2019-02-12 ENCOUNTER — Ambulatory Visit: Payer: Medicare Other | Admitting: Physical Therapy

## 2019-02-12 ENCOUNTER — Encounter: Payer: Self-pay | Admitting: Physical Therapy

## 2019-02-12 DIAGNOSIS — R2689 Other abnormalities of gait and mobility: Secondary | ICD-10-CM

## 2019-02-12 DIAGNOSIS — G8929 Other chronic pain: Secondary | ICD-10-CM

## 2019-02-12 DIAGNOSIS — M545 Low back pain, unspecified: Secondary | ICD-10-CM

## 2019-02-12 DIAGNOSIS — M6281 Muscle weakness (generalized): Secondary | ICD-10-CM | POA: Diagnosis not present

## 2019-02-12 NOTE — Therapy (Addendum)
[place], [place], [address] Phone: [phone]   Fax:  [phone]  Physical Therapy Treatment/Discharge  Patient Details  Name: [name] MRN: [id number] Date of Birth: 05-08-1951 Referring Provider (PT): Dr. Jean Rosenthal    Encounter Date: 02/12/2019  PT End of Session - 02/12/19 1151    Visit Number  9    Number of Visits  12    Date for PT Re-Evaluation  02/27/19    PT Start Time  6063    PT Stop Time  1239    PT Time Calculation (min)  54 min       Past Medical History:  Diagnosis Date  . Abdominal hernia   . Acute blood loss as cause of postoperative anemia 02/16/2017  . Arthritis    knees, back, R ankle   . Bilateral carpal tunnel syndrome 04/29/2012  . Diabetes mellitus   . Diabetes mellitus type 2 in obese ([facility]) 12/31/2015  . Edema    legs  . GERD (gastroesophageal reflux disease)   . HOH (hard of hearing)   . Hypertension   . Lipoma 09/30/2012  . Obesity    morbid obesity  . OBESITY, MORBID 10/16/2007   Qualifier: Diagnosis of  By: Enrique Sack    . Osteoarthrosis involving lower leg 10/16/2007   Qualifier: Diagnosis of  By: Enrique Sack    . 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 Status post total right knee replacement 08/21/2017  . Subclinical hypothyroidism 10/11/2016  . Tachycardia 08/22/2011  . Vertigo   . Vitamin D deficiency 02/16/2017    Past Surgical History:  Procedure Laterality Date  . [place]?   right  . CARPAL TUNNEL RELEASE Left 07/10/2014   Procedure: LEFT ENDOSCOPIC CARPAL TUNNEL RELEASE ;  Surgeon: Jolyn Nap, MD;  Location: [facility];  Service: Orthopedics;  Laterality: Left;  . CARPAL TUNNEL RELEASE Right   . COLONOSCOPY    . HERNIA REPAIR  07/03/2011   abd hernia  . NOSE SURGERY  2008   sinus  . TOTAL KNEE ARTHROPLASTY Left 02/06/2017   Procedure: LEFT TOTAL KNEE ARTHROPLASTY;   Surgeon: Mcarthur Rossetti, MD;  Location: [facility];  Service: Orthopedics;  Laterality: Left;  . TOTAL KNEE ARTHROPLASTY Right 08/21/2017  . TOTAL KNEE ARTHROPLASTY Right 08/21/2017   Procedure: RIGHT TOTAL KNEE ARTHROPLASTY;  Surgeon: Mcarthur Rossetti, MD;  Location: [facility];  Service: Orthopedics;  Laterality: Right;  . TUBAL LIGATION      There were no vitals filed for this visit.  Subjective Assessment - 02/12/19 1149    Subjective  I had a catch in my back 2 days ago that seems to be gone now. Standing in the same, I still have pain with standing. I feel pretty good right now. 2/10.     Currently in Pain?  Yes    Pain Score  2     Pain Location  Back    Pain Orientation  Lower;Left;Right    Pain Descriptors / Indicators  Tightness    Pain Type  Chronic pain                       [facility] Adult PT Treatment/Exercise - 02/12/19 0001      Lumbar Exercises: Stretches   Active Hamstring Stretch  Right;Left;3 reps;30 seconds    Active Hamstring Stretch Limitations  seated     Other Lumbar  Stretch Exercise  seated lumbar flexion on large ball x 10       Lumbar Exercises: Standing   Heel Raises  10 reps    Functional Squats  10 reps    Functional Squats Limitations  touch elevated seat (foam pad in chair)    Row  20 reps    Theraband Level (Row)  Level 3 (Green)    Shoulder Extension  20 reps;Theraband    Theraband Level (Shoulder Extension)  Level 3 (Green)    Other Standing Lumbar Exercises  3 way hip 3# 10 x 2 each                   PT Long Term Goals - 02/10/19 1222      PT LONG TERM GOAL #1   Title  Pt will be I with HEP for long term wellness, including trunk flexibility/stability and hip strength     Status  On-going      PT LONG TERM GOAL #2   Title  Pt will be able to stand for ADLs and home tasks up to 15 min with less pain overall, no more than 3/10 most of the time     Baseline  prior to Sat was "pretty good" for short times.       Status  On-going      PT LONG TERM GOAL #3   Title  Pt will be able to lie on her L side for sleeping a portion of the time with min pain increase.     Status  On-going      PT LONG TERM GOAL #4   Title  Pt will be able to walk for 30 min in the community with pain <5/10 for shopping, errands, church .     Status  On-going      PT LONG TERM GOAL #5   Title  Pt will increase hip abduction strength to 3+/5 in each LE to work towards a more normalized gait pattern.     Status  On-going            Plan - 02/12/19 1211    Clinical Impression Statement  Pt reports spasm in back relieved. Had some mild pain with standing to get ready for appointment this morning. Added 3# ankle weights to standing hip exercises. issued green band for scapular bands. Trial of HMP at end of session     PT Next Visit Plan  objective measures (MMT or ROM) ; flexion based core (use ball, circle), standing bands, seated core     PT Home Exercise Plan  seated PPT, hamstring, 3 way hip, horiz abd red , seated clam and march green band , seated trunk motion , standing row and extension    Consulted and Agree with Plan of Care  Patient       Patient will benefit from skilled therapeutic intervention in order to improve the following deficits and impairments:  Abnormal gait, Decreased balance, Decreased endurance, Decreased mobility, Difficulty walking, Obesity, Improper body mechanics, Decreased range of motion, Decreased activity tolerance, Decreased strength, Increased fascial restricitons, Impaired UE functional use, Pain, Postural dysfunction  Visit Diagnosis: Chronic bilateral low back pain without sciatica  Other abnormalities of gait and mobility  Muscle weakness (generalized)     Problem List Patient Active Problem List   Diagnosis Date Noted  . Severe obesity (BMI >= 40) ([facility]) 01/06/2019  . Estrogen deficiency 10/28/2018  . Benign positional vertigo 08/30/2017  . Hyperlipidemia associated  with  type 2 diabetes mellitus ([facility]) 08/30/2017  . Acute gouty arthritis 08/30/2017  . Status post total right knee replacement 08/21/2017  . Acute blood loss as cause of postoperative anemia 02/16/2017  . GERD (gastroesophageal reflux disease) 02/16/2017  . Vitamin D deficiency 02/16/2017  . 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 Unilateral primary osteoarthritis, right knee 01/10/2017  . Unilateral primary osteoarthritis, left knee 01/10/2017  . Subclinical hypothyroidism 10/11/2016  . Decreased hearing of left ear 10/11/2016  . Diabetes mellitus type 2 in obese ([facility]) 12/31/2015  . Hypertriglyceridemia 08/30/2015  . Dyspnea 04/02/2013  . Cough 02/12/2013  . Lipoma 09/30/2012  . Bilateral carpal tunnel syndrome 04/29/2012  . Abdominal pain, left lateral 04/03/2012  . Edema 08/22/2011  . OBESITY, MORBID 10/16/2007  . Essential hypertension 10/16/2007  . Osteoarthrosis involving lower leg 10/16/2007    Dorene Ar, [place] 02/12/2019, 12:44 PM  [facility] [address] [place], [place], [address] Phone: [phone]   Fax:  [phone]  Name: [name] MRN: [id number] Date of Birth: Feb 05, 1951  PHYSICAL THERAPY DISCHARGE SUMMARY  Visits from Start of Care: 9  Current functional level related to goals / functional outcomes: See above for most recent info    Remaining deficits: Unknown at this time    Education / Equipment: HEP, activity recommendations, posture, body mechanics   Plan: Patient agrees to discharge.  Patient goals were partially met. Patient is being discharged due to not returning since the last visit.  ?????    Patient has not been in PT since late Feb.  She was hoping to get an MRI.  The concern for COVID-19 in the community resulted in a clinic shut down.  She may choose to come back to PT after MRI, when clinic opens in June.  She was making good progress and then stopped coming.    Raeford Razor, PT 04/08/19 10:45 AM Phone: [phone] Fax: [phone]

## 2019-02-17 ENCOUNTER — Ambulatory Visit (INDEPENDENT_AMBULATORY_CARE_PROVIDER_SITE_OTHER): Payer: Medicare Other | Admitting: Orthopaedic Surgery

## 2019-02-17 ENCOUNTER — Encounter (INDEPENDENT_AMBULATORY_CARE_PROVIDER_SITE_OTHER): Payer: Self-pay | Admitting: Orthopaedic Surgery

## 2019-02-17 ENCOUNTER — Ambulatory Visit: Payer: Medicare Other | Admitting: Physical Therapy

## 2019-02-17 DIAGNOSIS — M545 Low back pain, unspecified: Secondary | ICD-10-CM

## 2019-02-17 DIAGNOSIS — G8929 Other chronic pain: Secondary | ICD-10-CM | POA: Diagnosis not present

## 2019-02-17 NOTE — Progress Notes (Signed)
The patient comes in today for continued follow-up of chronic low back pain with sciatica.  She is 68 years old.  She does weigh 332 pounds.  She is appropriately been through 6 weeks of physical therapy.  She still having back pain that is bothering her quite a bit and she is not doing well overall.  She denies any change in bowel bladder function denies any weakness in her legs but she still has the back pain and sciatic symptoms.  On examination she has a hard time getting out of a chair secondary to weight.  She has a positive straight leg raise bilaterally but no profound weakness.  There is some slight numbness and tingling in the feet.  She is a diabetic.  At this point an MRI certainly could be helpful in determining whether or not she has significant facet disease in her spine or determine if there is any nerve impingement that would benefit from either facet joint steroid injections or an epidural.  She agrees with at least heading that route.  We will work on having the MRI set up and then see her back in follow-up to go over the results to see if she would benefit from referral to someone such as Dr. Ernestina Patches who can potentially provide an injection in her lumbar spine.

## 2019-02-19 ENCOUNTER — Ambulatory Visit: Payer: Medicare Other | Admitting: Physical Therapy

## 2019-02-24 ENCOUNTER — Encounter: Payer: Medicare Other | Admitting: Physical Therapy

## 2019-02-26 ENCOUNTER — Encounter: Payer: Medicare Other | Admitting: Physical Therapy

## 2019-02-27 ENCOUNTER — Telehealth (INDEPENDENT_AMBULATORY_CARE_PROVIDER_SITE_OTHER): Payer: Self-pay | Admitting: *Deleted

## 2019-02-27 DIAGNOSIS — M545 Low back pain, unspecified: Secondary | ICD-10-CM

## 2019-02-27 DIAGNOSIS — G8929 Other chronic pain: Secondary | ICD-10-CM

## 2019-02-27 NOTE — Telephone Encounter (Signed)
Pt called left vm stating she was in a week ago and Dr. Ninfa Linden was going to order an MRI LSP and is calling because she has not heard from anyone about getting scheduled.   I checked referral no order was placed, I looked at last ov notes and it was mentioned that he is wanting to get her scheduled for MRI LSP, I put in order for MRI lumbar spine without contrast.  I tried calling pt no vm set up.

## 2019-03-03 ENCOUNTER — Ambulatory Visit (INDEPENDENT_AMBULATORY_CARE_PROVIDER_SITE_OTHER): Payer: Medicare Other | Admitting: Physician Assistant

## 2019-03-03 ENCOUNTER — Encounter: Payer: Medicare Other | Admitting: Physical Therapy

## 2019-03-05 ENCOUNTER — Encounter: Payer: Medicare Other | Admitting: Physical Therapy

## 2019-03-10 ENCOUNTER — Ambulatory Visit (HOSPITAL_COMMUNITY): Admission: RE | Admit: 2019-03-10 | Payer: Medicare Other | Source: Ambulatory Visit

## 2019-04-04 ENCOUNTER — Other Ambulatory Visit: Payer: Self-pay | Admitting: Family Medicine

## 2019-04-08 ENCOUNTER — Ambulatory Visit (HOSPITAL_COMMUNITY): Admission: RE | Admit: 2019-04-08 | Payer: Medicare Other | Source: Ambulatory Visit

## 2019-05-04 ENCOUNTER — Other Ambulatory Visit: Payer: Self-pay | Admitting: Family Medicine

## 2019-05-13 ENCOUNTER — Emergency Department (HOSPITAL_COMMUNITY)
Admission: EM | Admit: 2019-05-13 | Discharge: 2019-05-13 | Discharge status: Against Medical Advice | Payer: Medicare Other | Attending: Emergency Medicine | Admitting: Emergency Medicine

## 2019-05-13 ENCOUNTER — Emergency Department (HOSPITAL_COMMUNITY): Payer: Medicare Other

## 2019-05-13 ENCOUNTER — Other Ambulatory Visit: Payer: Self-pay

## 2019-05-13 ENCOUNTER — Ambulatory Visit (INDEPENDENT_AMBULATORY_CARE_PROVIDER_SITE_OTHER): Payer: Medicare Other | Admitting: Family Medicine

## 2019-05-13 ENCOUNTER — Encounter: Payer: Self-pay | Admitting: Family Medicine

## 2019-05-13 ENCOUNTER — Encounter (HOSPITAL_COMMUNITY): Payer: Self-pay | Admitting: Emergency Medicine

## 2019-05-13 DIAGNOSIS — R062 Wheezing: Secondary | ICD-10-CM

## 2019-05-13 DIAGNOSIS — R0601 Orthopnea: Secondary | ICD-10-CM | POA: Diagnosis not present

## 2019-05-13 DIAGNOSIS — Z5321 Procedure and treatment not carried out due to patient leaving prior to being seen by health care provider: Secondary | ICD-10-CM | POA: Insufficient documentation

## 2019-05-13 DIAGNOSIS — R197 Diarrhea, unspecified: Secondary | ICD-10-CM

## 2019-05-13 DIAGNOSIS — R059 Cough, unspecified: Secondary | ICD-10-CM

## 2019-05-13 DIAGNOSIS — R05 Cough: Secondary | ICD-10-CM | POA: Diagnosis not present

## 2019-05-13 DIAGNOSIS — R0602 Shortness of breath: Secondary | ICD-10-CM

## 2019-05-13 NOTE — ED Notes (Signed)
Pt. States shes leaving because the wait is too long. Asking for ride, Mobile security to take pt. To car.

## 2019-05-13 NOTE — ED Triage Notes (Signed)
Wheezing cough x 4 weeks  Diarrhea last sat

## 2019-05-13 NOTE — Progress Notes (Signed)
   Subjective:   Documentation for virtual audio and video telecommunications through [facility] encounter:  The patient was located at home. 2 patient identifiers used.  The provider was located in the office. The patient did consent to this visit and is aware of possible charges through their insurance for this visit.  The other persons participating in this telemedicine service were none.    Patient ID: [name], female    DOB: 1951/02/08, 68 y.o.   MRN: [id number]  HPI Chief Complaint  Patient presents with  . cough/ wheezing    2 weeks ago of sinus and moved into chest, diarrhea saturday, loss of smell and taste.    She is a 68 year old female with a history of diabetes, HTN, HL, and morbid obesity who complains of 2 week history of coughing, wheezing and shortness of breath. States she cannot lie down at night due to worsening shortness of breath. States she developed diarrhea Saturday, had 2 episodes.  States symptoms preceded by intermittent sinus pressure and post nasal drainage.   States she took an entire box of Mucinex DM. No improvement.   No known sick contacts but she has been leaving her house to go to the grocery store, pharmacy, etc.   Never smoked.   Denies history of asthma, COPD, pneumonia.   Unable to check vitals for me.   Denies dizziness, chest pain, palpitations, abdominal pain, nausea, vomiting.   Reviewed allergies, medications, past medical, surgical, family, and social history.   Review of Systems Pertinent positives and negatives in the history of present illness.     Objective:   Physical Exam LMP  (LMP Unknown)   Alert and oriented and in no acute distress. Speaking in broken sentences and she is congested sounding. Coughing throughout the visit.       Assessment & Plan:  Shortness of breath  Coughing  Wheezing  Orthopnea  Diarrhea, unspecified type  Discussed that due to limitations of our virtual visit and unable to  check vital signs, I recommend that she go to [facility] ED for further evaluation. Considered starting her on an antibiotic and albuterol inhaler however, she is having too many red flag symptoms to do this. States she is able to drive herself to the ED and does not need assistance.   Time spent on call was 15 minutes and in review of previous records 2 minutes total.  This virtual service is not related to other E/M service within previous 7 days.  99441 (5-73min) 99442 (11-28min) 99443 (21-31min)

## 2019-05-14 ENCOUNTER — Telehealth: Payer: Self-pay | Admitting: Internal Medicine

## 2019-05-14 MED ORDER — ALBUTEROL SULFATE HFA 108 (90 BASE) MCG/ACT IN AERS: 2.0000 | INHALATION_SPRAY | Freq: Four times a day (QID) | RESPIRATORY_TRACT | 0 refills | Status: DC | PRN

## 2019-05-14 MED ORDER — AMOXICILLIN-POT CLAVULANATE 875-125 MG PO TABS: 1.0000 | ORAL_TABLET | Freq: Two times a day (BID) | ORAL | 0 refills | Status: DC

## 2019-05-14 NOTE — Telephone Encounter (Signed)
Sent med in and notified pt

## 2019-05-14 NOTE — Telephone Encounter (Signed)
Please send in medications but clarify no allergy to amoxicillin or Augmentin.  Albuterol inhaler 2 puffs q4-6 hours PRN coughing and wheezing Augmentin 875-125mg  take 1 tablet bid x 10 days. #20 no refills.

## 2019-05-14 NOTE — Telephone Encounter (Signed)
Pt was notified of results for xray. Please send in med

## 2019-05-22 ENCOUNTER — Telehealth: Payer: Self-pay | Admitting: Internal Medicine

## 2019-05-22 NOTE — Telephone Encounter (Signed)
She should be seen to determine what is the underlying cause for her symptoms.

## 2019-05-22 NOTE — Telephone Encounter (Signed)
Pt has a virtual with shane tomorrow. [place]

## 2019-05-22 NOTE — Telephone Encounter (Signed)
Pt had appt on 05/13/2019 with vickie and vickie has prescribed inhaler and Augmentin for her but she is no better. She is still having wheezing, cough, sob. She has been using her inhaler every 6 hours but feels she needs to use it a lot more due to SOB and wheezing. She can not lay down as the wheezing gets worse. Please advise.

## 2019-05-23 ENCOUNTER — Ambulatory Visit (INDEPENDENT_AMBULATORY_CARE_PROVIDER_SITE_OTHER): Payer: Medicare Other | Admitting: Family Medicine

## 2019-05-23 ENCOUNTER — Other Ambulatory Visit: Payer: Self-pay

## 2019-05-23 ENCOUNTER — Encounter: Payer: Self-pay | Admitting: Family Medicine

## 2019-05-23 DIAGNOSIS — R0609 Other forms of dyspnea: Secondary | ICD-10-CM | POA: Diagnosis not present

## 2019-05-23 DIAGNOSIS — J40 Bronchitis, not specified as acute or chronic: Secondary | ICD-10-CM

## 2019-05-23 DIAGNOSIS — J3089 Other allergic rhinitis: Secondary | ICD-10-CM

## 2019-05-23 DIAGNOSIS — R197 Diarrhea, unspecified: Secondary | ICD-10-CM | POA: Diagnosis not present

## 2019-05-23 DIAGNOSIS — R0600 Dyspnea, unspecified: Secondary | ICD-10-CM

## 2019-05-23 DIAGNOSIS — R600 Localized edema: Secondary | ICD-10-CM | POA: Insufficient documentation

## 2019-05-23 NOTE — Progress Notes (Signed)
Subjective:   Documentation for virtual audio and video telecommunications through [facility] encounter:  The patient was located at home.  2 patient identifiers were used. The provider was located in the office. The patient did consent to this visit and is aware of possible charges through their insurance for this visit.  The other persons participating in this telemedicine service were none.    Patient ID: [name], female    DOB: [birth year], 68 y.o.   MRN: [id number]  HPI Chief Complaint  Patient presents with  . follow up    follow up bronchitis    Requests a virtual visit today to follow-up on bronchitis and to discuss other medical concerns.  Reports gradually improving cough and wheezing associated with bronchitis.  Completed Augmentin yesterday.  Using albuterol inhaler as needed.  States cough is not as frequent and only coughing up clear sputum now.  Does not have chest tightness or wheezing today.  Feels like she is improving.  No history of asthma or COPD.  Allergies- takes Claritin daily and Flonase once daily some days.   States she has shortness of breath when she is walking around in her house.  This is new and started with the bronchitis symptoms.   Denies fever, chills, dizziness, chest pain, palpitations, orthopnea, or increased urination.  Reports bilateral lower extremity edema that resolves each night when elevating her legs.  No edema in the mornings.  Her diet recently has been higher in sodium and she is very sedentary.  She has a history of this and has worn compression stockings in the past but not recently.  States 2 days ago she developed abdominal bloating, felt constipated and nauseated so she took [employer].  Diarrhea started yesterday evening.  States she had 2 episodes of loose stool yesterday.  Abdominal cramping also. States she had nausea but this resolved.  One episode of diarrhea this morning and it was black.  Denies it being tarry or  coffee-ground and states it is just normal stool consistency.  No bright red blood.  No vomiting.  States she is able to eat and drinking plenty of fluids.   States her stomach sore from coughing.   Denies fever, chills, dizziness, chest pain, palpitations, shortness of breath, abdominal pain, N/V/D, urinary symptoms, LE edema.     Review of Systems Pertinent positives and negatives in the history of present illness.     Objective:   Physical Exam LMP  (LMP Unknown)   States she is unable to check her vital signs due to her blood pressure machine not working. Alert and oriented and in no acute distress.  Speaking in complete sentences without difficulty.  Respirations appear unlabored.  Denies chest pain, palpitations, increased respirations.  Denies wheezing or shortness of breath at rest.  Normal speech, mood and thought process.      Assessment & Plan:  Bronchitis  Environmental and seasonal allergies  Bilateral leg edema  Diarrhea, unspecified type  DOE (dyspnea on exertion) Discussed limitations of a virtual visit. She does not appear to be in any acute distress.  No obvious infectious process. Reports improvement in coughing, chest congestion and wheezing and completed the antibiotic yesterday.  She may continue using albuterol as needed for now. Discussed that the shortness of breath with exertion is most likely due to deconditioning and her current bronchitis and this should improve gradually. States her allergies are starting to bother her so we will stop the Claritin and have her try  Xyzal.  She may use Flonase if needed. Lower extremity edema is not new and this is intermittent.  Reportedly resolves by each morning.  Her diet appears to be high in sodium and she is extremely sedentary.  Encouraged her to cut back on sodium and try to gradually improve her activity level.  I also recommend that she use compression stockings that she has at home. Unclear etiology for  diarrhea and abdominal cramping.  Does not appear infectious.  No bleeding.  Discussed that Pepto-Bismol makes stools darker than usual. She is staying well-hydrated.  Discussed that she may take Imodium if needed as long as she does not have fever or blood. Discussed that if she has any red flag symptoms such as fever, severe abdominal pain, vomiting or worsening shortness of breath that she should be seen right away.  If this happens over the weekend I advised her to go to the emergency department. Discussed COVID 19 most common symptoms and the fact that she does have some of the more common symptoms and that we need to keep an eye on this.  If she feels like she is getting worse then she will need to go to the emergency department or to a testing site.  I think this is unlikely based on her limited exposure to people over the past few weeks but this was considered. Otherwise, she will let me know how she is doing next week.  She is due for diabetes follow-up and we will schedule this in the next few weeks.   Time spent on call was  25 minutes and in review of previous records 2 minutes total.  This virtual service is not related to other E/M service within previous 7 days.  99441 (5-23min) 99442 (11-47min) 99443 (21-37min)

## 2019-06-05 ENCOUNTER — Encounter: Payer: Self-pay | Admitting: Pulmonary Disease

## 2019-06-05 ENCOUNTER — Other Ambulatory Visit: Payer: Self-pay | Admitting: Family Medicine

## 2019-06-05 ENCOUNTER — Ambulatory Visit (INDEPENDENT_AMBULATORY_CARE_PROVIDER_SITE_OTHER): Payer: Medicare Other | Admitting: Pulmonary Disease

## 2019-06-05 ENCOUNTER — Other Ambulatory Visit: Payer: Self-pay

## 2019-06-05 VITALS — BP 126/80 | HR 117 | Temp 97.8°F | Ht 66.0 in | Wt 338.0 lb

## 2019-06-05 DIAGNOSIS — J42 Unspecified chronic bronchitis: Secondary | ICD-10-CM | POA: Diagnosis not present

## 2019-06-05 MED ORDER — BREO ELLIPTA 200-25 MCG/INH IN AEPB: 1.0000 | INHALATION_SPRAY | Freq: Every day | RESPIRATORY_TRACT | 0 refills | Status: DC

## 2019-06-05 MED ORDER — BENZONATATE 100 MG PO CAPS: 100.0000 mg | ORAL_CAPSULE | Freq: Three times a day (TID) | ORAL | 0 refills | Status: DC | PRN

## 2019-06-05 NOTE — Progress Notes (Signed)
Subjective:   PATIENT ID: [name] GENDER: female DOB: 1951-12-02, MRN: [id number]   HPI  Chief Complaint  Patient presents with  . Consult    Consult re: Wheezing and SOB. Patient reports    [name] of 68 year old female with chronic allergies, diabetes, GERD and hypertension who presents as a new consult for chronic bronchitis x2 months.   Office notes reviewed including PCP visit 6/5 summarized as follows: Seen in clinic for dyspnea, cough, chest congestion and wheezing.  Was treated with Augmentin with no improvement.  She continues to report persistent shortness of breath and wheezing.  Has tried albuterol inhaler.  Continues to have productive cough previously with yellow/green sputum but now clear.  Associated with wheezing.  Symptoms seem to be worse in the mornings but continues throughout the day.  Reports nasal congestion and mild sinus pressure with headache.  Denies any childhood respiratory illness or diagnosis.  Denies recent fevers, chills, chest pain.  She cannot identify any triggers to her symptoms.  He does note that her respiratory symptoms are preceded by sinus congestion.    Never smoker  I have personally reviewed patient's past medical/family/social history, allergies, current medications.  Past Medical History:  Diagnosis Date  . Abdominal hernia   . Acute blood loss as cause of postoperative anemia 02/16/2017  . Arthritis    knees, back, R ankle   . Bilateral carpal tunnel syndrome 04/29/2012  . Diabetes mellitus   . Diabetes mellitus type 2 in obese ([place]) 12/31/2015  . Edema    legs  . GERD (gastroesophageal reflux disease)   . HOH (hard of hearing)   . Hypertension   . Lipoma 09/30/2012  . Obesity    morbid obesity  . OBESITY, MORBID 10/16/2007   Qualifier: Diagnosis of  By: Enrique Sack    . Osteoarthrosis involving lower leg 10/16/2007   Qualifier: Diagnosis of  By: Enrique Sack    . 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 Status post total right knee replacement 08/21/2017  . Subclinical hypothyroidism 10/11/2016  . Tachycardia 08/22/2011  . Vertigo   . Vitamin D deficiency 02/16/2017     Family History  Problem Relation Age of Onset  . Hypertension Mother   . Kidney disease Mother   . Diabetes Father   . Hypertension Father   . Diabetes Sister   . Hypertension Sister   . Diabetes Brother   . Hypertension Brother   . Heart disease Brother 82       heart attack  . Hypertension Sister      Social History   Socioeconomic History  . Marital status: Single    Spouse name: Not on file  . Number of children: 2  . Years of education: Not on file  . Highest education level: Not on file  Occupational History  . 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 Financial resource strain: Not on file  . Food insecurity    Worry: Not on file    Inability: Not on file  . Transportation needs    Medical: Not on file    Non-medical: Not on file  Tobacco Use  . Smoking status: Never Smoker  . Smokeless tobacco: Never Used  Substance and Sexual Activity  . Alcohol use: No  . Drug use: No  . Sexual activity: Never  Lifestyle  . Physical activity    Days per week: Not on file  Minutes per session: Not on file  . Stress: Not on file  Relationships  . Social Herbalist on phone: Not on file    Gets together: Not on file    Attends religious service: Not on file    Active member of club or organization: Not on file    Attends meetings of clubs or organizations: Not on file    Relationship status: Not on file  . Intimate partner violence    Fear of current or ex partner: Not on file    Emotionally abused: Not on file    Physically abused: Not on file    Forced sexual activity: Not on file  Other Topics Concern  . Not on file  Social History Narrative   Admitted to [facility] 08/24/17   Divorced   J[employer] Witness     Never smoked   Alcohol none   Full Code     Allergies  Allergen Reactions  . Other Other (See Comments)    NO Blood Transfusions, or giving blood  . Sulfa Antibiotics Rash     Outpatient Medications Prior to Visit  Medication Sig Dispense Refill  . albuterol (VENTOLIN HFA) 108 (90 Base) MCG/ACT inhaler Inhale 2 puffs into the lungs every 6 (six) hours as needed for wheezing or shortness of breath. 1 Inhaler 0  . amoxicillin-clavulanate (AUGMENTIN) 875-125 MG tablet Take 1 tablet by mouth 2 (two) times daily. (Patient not taking: Reported on 05/23/2019) 20 tablet 0  . aspirin EC 81 MG tablet Take 81 mg by mouth daily.    . Cyanocobalamin (VITAMIN B-12 PO) Take by mouth.    Marland Kitchen ePHEDrine-guaiFENesin (PRIMATENE ASTHMA) 12.5-200 MG TABS Take by mouth.    . famotidine (PEPCID) 20 MG tablet TAKE 1 TABLET BY MOUTH TWO  TIMES DAILY 180 tablet 0  . glimepiride (AMARYL) 4 MG tablet TAKE 1 TABLET BY MOUTH  EVERY DAY WITH BREAKFAST 90 tablet 0  . losartan-hydrochlorothiazide (HYZAAR) 100-12.5 MG tablet TAKE 1 TABLET BY MOUTH  DAILY 90 tablet 0  . meclizine (ANTIVERT) 25 MG tablet Take 1 tablet (25 mg total) by mouth 3 (three) times daily as needed for dizziness. (Patient not taking: Reported on 05/23/2019) 21 tablet 0  . metFORMIN (GLUCOPHAGE) 1000 MG tablet TAKE 1 TABLET BY MOUTH TWO  TIMES DAILY WITH MEALS 180 tablet 0  . Multiple Vitamin (MULTIVITAMIN) tablet Take 1 tablet by mouth every other day.     . Pitavastatin Calcium (LIVALO) 4 MG TABS Take 1 tablet (4 mg total) by mouth daily. 90 tablet 1   No facility-administered medications prior to visit.     Review of Systems  Constitutional: Negative for chills, diaphoresis, fever, malaise/fatigue and weight loss.  HENT: Negative for congestion, ear pain and sore throat.   Respiratory: Positive for cough, sputum production, shortness of breath and wheezing. Negative for hemoptysis.   Cardiovascular: Negative for chest pain, palpitations and leg  swelling.  Gastrointestinal: Negative for abdominal pain, heartburn and nausea.  Genitourinary: Negative for frequency.  Musculoskeletal: Negative for joint pain and myalgias.  Skin: Negative for itching and rash.  Neurological: Negative for dizziness, weakness and headaches.  Endo/Heme/Allergies: Does not bruise/bleed easily.  Psychiatric/Behavioral: Negative for depression. The patient is not nervous/anxious.       Objective:  Physical Exam   Vitals:   06/05/19 1111  BP: 126/80  Pulse: (!) 117  Temp: 97.8 F (36.6 C)  TempSrc: Oral  SpO2: 97%  Weight: Marland Kitchen)  338 lb (153.3 kg)  Height: 5\' 6"  (1.676 m)   Physical Exam: General: Morbid obesity, well-appearing, no acute distress HENT: [place], AT Eyes: EOMI, no scleral icterus Respiratory: Mild rhonchi with inspiratory breath.  No wheezing Cardiovascular: RRR, -M/R/G, no JVD GI: BS+, soft, nontender Extremities: Nonpitting lower extremity edema,-tenderness Neuro: AAO x4, CNII-XII grossly intact Skin: Intact, no rashes or bruising Psych: Normal mood, normal affect  Chest imaging: CXR 05/13/2019-c okay new order PFTs entral bronchial thickening  PFT: None on file  I have personally reviewed the above labs, images and tests noted above.    Assessment & Plan:   Chronic bronchitis  Discussion: 68 year old female never smoker with diabetes, GERD and hypertension who presents as a new consult for chronic bronchitis x2 months.  Not improved on antibiotics.  Has only tried albuterol.  Will manage symptoms with bronchodilators and allergy regimen as below.  For you chronic bronchitis: --START Breo 200-25 mcg 1 puff daily --CONTINUE Albuterol inhaler 2 puffs every 4 hours as needed for shortness of breath or wheezing --STOP xyzal and RESUME Claritin for allergies --START Flonase 1 spray per nare daily --Obtain pulmonary function tests to rule out obstructive disease --Order Ladona Ridgel for cough  Follow-up in 3 months    [place], MD [facility] 06/05/2019 10:55 AM

## 2019-06-05 NOTE — Patient Instructions (Addendum)
For you chronic bronchitis: --START Breo 200-25 mcg 1 puff daily --CONTINUE Albuterol inhaler 2 puffs every 4 hours as needed for shortness of breath or wheezing --STOP xyzal and RESUME Claritin for allergies --START Flonase 1 spray per nare daily --ORDER Pulmonary function test to rule out obstructive lung disease  Follow-up in 3 months

## 2019-06-05 NOTE — Telephone Encounter (Signed)
Pt has an appt next week and it was last filled on 03/2019 so she should have enough until july

## 2019-06-05 NOTE — Progress Notes (Signed)
Patient seen in the office today and instructed on use of Breo 200.  Patient expressed understanding and demonstrated technique.  

## 2019-06-06 ENCOUNTER — Telehealth: Payer: Self-pay | Admitting: Internal Medicine

## 2019-06-06 NOTE — Telephone Encounter (Signed)
Pt has appt Monday in office, pt was seen at pulmonary office yesterday. Please advise Juliann Pulse and myself if pt can still be seen in office monday

## 2019-06-06 NOTE — Telephone Encounter (Signed)
Kathy notified.

## 2019-06-06 NOTE — Telephone Encounter (Signed)
Ok to see her in office, diabetes check, unless symptoms change.

## 2019-06-08 NOTE — Progress Notes (Signed)
Subjective:    Patient ID: [name], female    DOB: [birth year], 67 y.o.   MRN: [id number]  Walburga Hudman Waltrip is a 68 y.o. female who presents for follow-up of Type 2 diabetes mellitus and other chronic health conditions.   States she is using summers eve powder in her groin, feeling irritated. States she thinks she has a yeast infection.  Denies being sexually active.   Recently saw pulmonology for persistent cough, wheezing without a history of asthma. She is now using Breo daily, Flonase and Claritin and reports doing well. States she is able to lay flat now without having shortness of breath. States since she can lay down, the swelling in her feet has resolved.   States she had her colonoscopy in 2014 at [facility]. Will need to get records.   Patient is checking home blood sugars.   Home blood sugar records: FBS around 130 usually  How often is blood sugars being checked: once a week Current symptoms include: none. Patient denies increased appetite, nausea, paresthesia of the feet, visual disturbances, vomiting and weight loss.  Patient is checking their feet daily. Any Foot concerns (callous, ulcer, wound, thickened nails, toenail fungus, skin fungus, hammer toe): none Last dilated eye exam: February 2020  Current treatments: taking metformin bid. Taking glimepride at bedtime due to the medication making her feel "sick" when she takes it in the morning. States she feels like it drops her BS too much.  Medication compliance: good  Current diet: in general, an "unhealthy" diet Current exercise: no regular exercise Known diabetic complications: nephropathy  The following portions of the patient's history were reviewed and updated as appropriate: allergies, current medications, past medical history, past social history and problem list.  ROS as in subjective above.     Objective:    Physical Exam Alert and oriented and in no distress. Cardiac exam shows a regular rhythm without  murmurs or gallops. Lungs are clear to auscultation. Extremities without edema. Skin is warm and dry.    Blood pressure 134/80, pulse 94, temperature 97.9 F (36.6 C), temperature source Oral, resp. rate 16, weight (!) 335 lb (152 kg), SpO2 96 %.  Lab Review Diabetic Labs Latest Ref Rng & Units 06/09/2019 10/28/2018 05/03/2018 10/05/2017 10/03/2017  HbA1c 4.0 - 5.6 % 7.5(A) 7.3(H) 7.0 - 6.7%  Microalbumin mg/dL - - - - 0.9  Micro/Creat Ratio <30 mcg/mg creat - - - - 8  Chol 100 - 199 mg/dL - 172 176 150 -  HDL >39 mg/dL - 41 40 34(L) -  Calc LDL 0 - 99 mg/dL - 99 104(H) 91 -  Triglycerides 0 - 149 mg/dL - 161(H) 161(H) 146 -  Creatinine 0.57 - 1.00 mg/dL - 1.23(H) 1.23(H) 1.30(H) -   BP/Weight 06/09/2019 06/05/2019 05/13/2019 10/28/2018 12/19/5850  Systolic BP 778 242 353 614 431  Diastolic BP 80 80 64 80 74  Wt. (Lbs) 335 338 - 332.6 316.8  BMI 54.07 54.55 - 52.09 49.62   Foot/eye exam completion dates Latest Ref Rng & Units 02/04/2019 10/28/2018  Eye Exam No Retinopathy No Retinopathy -  Foot Form Completion - - Done    Chelsi  reports that she has never smoked. She has never used smokeless tobacco. She reports that she does not drink alcohol or use drugs.     Assessment & Plan:    Diabetes mellitus type 2 in obese ([place]) - Plan: HgB A1c, CBC with Differential/Platelet, Comprehensive metabolic panel  Essential hypertension - Plan: CBC  with Differential/Platelet, Comprehensive metabolic panel, Plan: BP is close to goal range. Continue current medications and check BP at home. Cut back on sodium.   Gastroesophageal reflux disease without esophagitis - Plan: avoid triggers and only take Pepcid prn  OBESITY, MORBID - Plan: cut back on portion sizes and carbohydrates and increase physical activity   Chronic bronchitis, unspecified chronic bronchitis type ([place]) - Plan: she is improving, managed by pulmonologist. Continue Breo, Claritin, Flonase as recommended.   Chronic kidney disease,  unspecified CKD stage - Plan: Comprehensive metabolic panel, discussed keeping blood sugars and BP in goal range to prevent worsening renal function. Check labs and monitor.   Vaginal irritation - Plan: fluconazole (DIFLUCAN) 150 MG tablet, refused pelvic exam. Requests diflucan and will follow up if symptoms do not resolve.   1. Rx changes: diabetes is not well controlled. Hgb A1c 7.5% and continuing to trend upward. continue Metformin bid, add Tradjenta and decrease glimepride to 2 mg so that she may take this in the morning instead of bedtime as she has been doing 2. Education: Reviewed 'ABCs' of diabetes management (respective goals in parentheses):  A1C (<7), blood pressure (<130/80), and cholesterol (LDL <100). 3. Compliance at present is estimated to be fair. Efforts to improve compliance (if necessary) will be directed at dietary modifications: cut back on sweets, carbohydrates and portion sizes, increased exercise and regular blood sugar monitoring: daily but vary the times of day which she takes it, fasting or 2 hours after a meal. 4. Follow up: 3 months

## 2019-06-09 ENCOUNTER — Encounter: Payer: Self-pay | Admitting: Family Medicine

## 2019-06-09 ENCOUNTER — Other Ambulatory Visit: Payer: Self-pay

## 2019-06-09 ENCOUNTER — Ambulatory Visit (INDEPENDENT_AMBULATORY_CARE_PROVIDER_SITE_OTHER): Payer: Medicare Other | Admitting: Family Medicine

## 2019-06-09 VITALS — BP 134/80 | HR 94 | Temp 97.9°F | Resp 16 | Wt 335.0 lb

## 2019-06-09 DIAGNOSIS — I10 Essential (primary) hypertension: Secondary | ICD-10-CM | POA: Diagnosis not present

## 2019-06-09 DIAGNOSIS — N189 Chronic kidney disease, unspecified: Secondary | ICD-10-CM | POA: Diagnosis not present

## 2019-06-09 DIAGNOSIS — N898 Other specified noninflammatory disorders of vagina: Secondary | ICD-10-CM

## 2019-06-09 DIAGNOSIS — K219 Gastro-esophageal reflux disease without esophagitis: Secondary | ICD-10-CM | POA: Diagnosis not present

## 2019-06-09 DIAGNOSIS — E669 Obesity, unspecified: Secondary | ICD-10-CM | POA: Diagnosis not present

## 2019-06-09 DIAGNOSIS — J42 Unspecified chronic bronchitis: Secondary | ICD-10-CM | POA: Insufficient documentation

## 2019-06-09 DIAGNOSIS — E1169 Type 2 diabetes mellitus with other specified complication: Secondary | ICD-10-CM

## 2019-06-09 LAB — POCT GLYCOSYLATED HEMOGLOBIN (HGB A1C): Hemoglobin A1C: 7.5 % — AB (ref 4.0–5.6)

## 2019-06-09 MED ORDER — LINAGLIPTIN 5 MG PO TABS: 5.0000 mg | ORAL_TABLET | Freq: Every day | ORAL | 0 refills | Status: DC

## 2019-06-09 MED ORDER — GLIMEPIRIDE 2 MG PO TABS: 2.0000 mg | ORAL_TABLET | Freq: Every day | ORAL | 1 refills | Status: DC

## 2019-06-09 MED ORDER — FLUCONAZOLE 150 MG PO TABS: 150.0000 mg | ORAL_TABLET | Freq: Once | ORAL | 0 refills | Status: AC

## 2019-06-09 NOTE — Patient Instructions (Addendum)
Your hemoglobin A1c is 7.5%.  Mixed up the times of day when you check your blood sugar.  Check it either fasting or 2 hours after a meal.  Keep a log of these readings.  Goal fasting blood sugar is 80-130. Goal blood sugar 2 hours after a meal is 130-160.  Continue taking metformin twice daily with meals. I am cutting your glimepiride dose from 4 mg to 2 mg.  Take this with your morning meal. Start the new medication Tradjenta 5 mg and take this with your first meal of the day also.  Cut back on carbohydrates such as bread, pasta, rice, potatoes and increase your physical activity as tolerated. Cut back on sweets and sugary beverages.  Continue on your other medications as prescribed.  Take the Diflucan and follow-up if the itching and irritation are not improving.  Follow-up in 3 months.   Hyperglycemia Hyperglycemia is when the sugar (glucose) level in your blood is too high. It may not cause symptoms. If you do have symptoms, they may include warning signs, such as:  Feeling more thirsty than normal.  Hunger.  Feeling tired.  Needing to pee (urinate) more than normal.  Blurry eyesight (vision). You may get other symptoms as it gets worse, such as:  Dry mouth.  Not being hungry (loss of appetite).  Fruity-smelling breath.  Weakness.  Weight gain or loss that is not planned. Weight loss may be fast.  A tingling or numb feeling in your hands or feet.  Headache.  Skin that does not bounce back quickly when it is lightly pinched and released (poor skin turgor).  Pain in your belly (abdomen).  Cuts or bruises that heal slowly. High blood sugar can happen to people who do or do not have diabetes. High blood sugar can happen slowly or quickly, and it can be an emergency. Follow these instructions at home: General instructions  Take over-the-counter and prescription medicines only as told by your doctor.  Do not use products that contain nicotine or tobacco,  such as cigarettes and e-cigarettes. If you need help quitting, ask your doctor.  Limit alcohol intake to no more than 1 drink per day for nonpregnant women and 2 drinks per day for men. One drink equals 12 oz of beer, 5 oz of wine, or 1 oz of hard liquor.  Manage stress. If you need help with this, ask your doctor.  Keep all follow-up visits as told by your doctor. This is important. Eating and drinking   Stay at a healthy weight.  Exercise regularly, as told by your doctor.  Drink enough fluid, especially when you: ? Exercise. ? Get sick. ? Are in hot temperatures.  Eat healthy foods, such as: ? Low-fat (lean) proteins. ? Complex carbs (complex carbohydrates), such as whole wheat bread or brown rice. ? Fresh fruits and vegetables. ? Low-fat dairy products. ? Healthy fats.  Drink enough fluid to keep your pee (urine) clear or pale yellow. If you have diabetes:   Make sure you know the symptoms of hyperglycemia.  Follow your diabetes management plan, as told by your doctor. Make sure you: ? Take insulin and medicines as told. ? Follow your exercise plan. ? Follow your meal plan. Eat on time. Do not skip meals. ? Check your blood sugar as often as told. Make sure to check before and after exercise. If you exercise longer or in a different way than you normally do, check your blood sugar more often. ? Follow your sick  day plan whenever you cannot eat or drink normally. Make this plan ahead of time with your doctor.  Share your diabetes management plan with people in your workplace, school, and household.  Check your urine for ketones when you are ill and as told by your doctor.  Carry a card or wear jewelry that says that you have diabetes. Contact a doctor if:  Your blood sugar level is higher than 240 mg/dL (13.3 mmol/L) for 2 days in a row.  You have problems keeping your blood sugar in your target range.  High blood sugar happens often for you. Get help right  away if:  You have trouble breathing.  You have a change in how you think, feel, or act (mental status).  You feel sick to your stomach (nauseous), and that feeling does not go away.  You cannot stop throwing up (vomiting). These symptoms may be an emergency. Do not wait to see if the symptoms will go away. Get medical help right away. Call your local emergency services (911 in the [place] Do not drive yourself to the hospital. Summary  Hyperglycemia is when the sugar (glucose) level in your blood is too high.  High blood sugar can happen to people who do or do not have diabetes.  Make sure you drink enough fluids, eat healthy foods, and exercise regularly.  Contact your doctor if you have problems keeping your blood sugar in your target range. This information is not intended to replace advice given to you by your health care provider. Make sure you discuss any questions you have with your health care provider. Document Released: 10/01/2009 Document Revised: 08/21/2016 Document Reviewed: 08/21/2016 Elsevier Interactive Patient Education  2019 [employer].

## 2019-06-10 LAB — COMPREHENSIVE METABOLIC PANEL
ALT: 11 IU/L (ref 0–32)
AST: 14 IU/L (ref 0–40)
Albumin/Globulin Ratio: 1.3 (ref 1.2–2.2)
Albumin: 4 g/dL (ref 3.8–4.8)
Alkaline Phosphatase: 76 IU/L (ref 39–117)
BUN/Creatinine Ratio: 16 (ref 12–28)
BUN: 20 mg/dL (ref 8–27)
Bilirubin Total: <0.2 mg/dL (ref 0.0–1.2)
CO2: 23 mmol/L (ref 20–29)
Calcium: 9.4 mg/dL (ref 8.7–10.3)
Chloride: 100 mmol/L (ref 96–106)
Creatinine, Ser: 1.24 mg/dL — ABNORMAL HIGH (ref 0.57–1.00)
GFR calc Af Amer: 53 mL/min/{1.73_m2} — ABNORMAL LOW (ref >59)
GFR calc non Af Amer: 46 mL/min/{1.73_m2} — ABNORMAL LOW (ref >59)
Globulin, Total: 3.1 g/dL (ref 1.5–4.5)
Glucose: 208 mg/dL — ABNORMAL HIGH (ref 65–99)
Potassium: 5.1 mmol/L (ref 3.5–5.2)
Sodium: 138 mmol/L (ref 134–144)
Total Protein: 7.1 g/dL (ref 6.0–8.5)

## 2019-06-10 LAB — CBC WITH DIFFERENTIAL/PLATELET
Basophils Absolute: 0 10*3/uL (ref 0.0–0.2)
Basos: 1 %
EOS (ABSOLUTE): 0.1 10*3/uL (ref 0.0–0.4)
Eos: 1 %
Hematocrit: 34.6 % (ref 34.0–46.6)
Hemoglobin: 10.8 g/dL — ABNORMAL LOW (ref 11.1–15.9)
Immature Grans (Abs): 0 10*3/uL (ref 0.0–0.1)
Immature Granulocytes: 0 %
Lymphocytes Absolute: 1.1 10*3/uL (ref 0.7–3.1)
Lymphs: 14 %
MCH: 28.9 pg (ref 26.6–33.0)
MCHC: 31.2 g/dL — ABNORMAL LOW (ref 31.5–35.7)
MCV: 93 fL (ref 79–97)
Monocytes Absolute: 0.5 10*3/uL (ref 0.1–0.9)
Monocytes: 6 %
Neutrophils Absolute: 5.9 10*3/uL (ref 1.4–7.0)
Neutrophils: 78 %
Platelets: 337 10*3/uL (ref 150–450)
RBC: 3.74 x10E6/uL — ABNORMAL LOW (ref 3.77–5.28)
RDW: 13.5 % (ref 11.7–15.4)
WBC: 7.6 10*3/uL (ref 3.4–10.8)

## 2019-06-26 ENCOUNTER — Telehealth: Payer: Self-pay | Admitting: Family Medicine

## 2019-06-26 NOTE — Telephone Encounter (Signed)
Requested records received from [facility]  

## 2019-07-09 ENCOUNTER — Encounter: Payer: Self-pay | Admitting: Internal Medicine

## 2019-07-17 ENCOUNTER — Encounter: Payer: Self-pay | Admitting: Family Medicine

## 2019-07-21 ENCOUNTER — Other Ambulatory Visit: Payer: Self-pay | Admitting: Family Medicine

## 2019-07-22 ENCOUNTER — Telehealth: Payer: Self-pay | Admitting: Family Medicine

## 2019-07-22 NOTE — Telephone Encounter (Signed)
Pt called for refill of metformin.

## 2019-08-12 ENCOUNTER — Telehealth: Payer: Self-pay | Admitting: Pulmonary Disease

## 2019-08-12 MED ORDER — BENZONATATE 100 MG PO CAPS: 100.0000 mg | ORAL_CAPSULE | Freq: Three times a day (TID) | ORAL | 0 refills | Status: DC | PRN

## 2019-08-12 NOTE — Telephone Encounter (Signed)
Called and spoke w/ pt. Pt states she needs a refill of Tessalon to go to [facility] at [address]. Pt last seen 06/05/2019 w/ [facility], where Tessalon was recommended. Tessalon last filled 06/05/2019 30 capsule x0 refills. I let her know we would get this taken care of for her and offered her an appt w/ JE as her last visit 06/05/2019 stated she needed an appt in 3 months (approx. September 2020). Pt expressed understanding and agreed to setting up appt for 08/26/2019 at 2:00 PM EST w/ [facility]. Pt then inquired about her PFT [facility] recommended. I let her know I would get a message routed to [facility], our PFT scheduler, to have that scheduled as soon as we are able. Pt is aware that the PFT process has changed (ex. pts need COVID test beforehand) and that scheduling is limited d/t coronavirus restrictions.   F/U appt has been scheduled. I am routing this message to [facility] per PFT scheduling protocol. PFT order was placed 06/05/2019. Nothing further needed at this time.

## 2019-08-14 NOTE — Telephone Encounter (Signed)
Called scheduled pft on 08/26/2019 at the [facility] hospital - pt aware -pr

## 2019-08-20 ENCOUNTER — Ambulatory Visit (HOSPITAL_COMMUNITY)
Admission: RE | Admit: 2019-08-20 | Discharge: 2019-08-20 | Disposition: A | Discharge status: Home / Self Care | Payer: Medicare Other | Source: Ambulatory Visit | Attending: Orthopaedic Surgery | Admitting: Orthopaedic Surgery

## 2019-08-20 ENCOUNTER — Other Ambulatory Visit: Payer: Self-pay

## 2019-08-20 DIAGNOSIS — M4317 Spondylolisthesis, lumbosacral region: Secondary | ICD-10-CM | POA: Diagnosis not present

## 2019-08-20 DIAGNOSIS — M545 Low back pain, unspecified: Secondary | ICD-10-CM

## 2019-08-20 DIAGNOSIS — G8929 Other chronic pain: Secondary | ICD-10-CM | POA: Insufficient documentation

## 2019-08-20 DIAGNOSIS — M5127 Other intervertebral disc displacement, lumbosacral region: Secondary | ICD-10-CM | POA: Diagnosis not present

## 2019-08-26 ENCOUNTER — Ambulatory Visit: Payer: Medicare Other | Admitting: Pulmonary Disease

## 2019-08-26 ENCOUNTER — Inpatient Hospital Stay (HOSPITAL_COMMUNITY): Admission: RE | Admit: 2019-08-26 | Payer: Medicare Other | Source: Ambulatory Visit

## 2019-08-26 NOTE — Progress Notes (Deleted)
Subjective:   PATIENT ID: [name] GENDER: female DOB: December 07, 1951, MRN: [id number]   HPI  No chief complaint on file.  [name] of 68 year old female with chronic allergies, diabetes, GERD and hypertension who presents as a new consult for chronic bronchitis x2 months.   Office notes reviewed including PCP visit 6/5 summarized as follows: Seen in clinic for dyspnea, cough, chest congestion and wheezing.  Was treated with Augmentin with no improvement.  She continues to report persistent shortness of breath and wheezing.  Has tried albuterol inhaler.  Continues to have productive cough previously with yellow/green sputum but now clear.  Associated with wheezing.  Symptoms seem to be worse in the mornings but continues throughout the day.  Reports nasal congestion and mild sinus pressure with headache.  Denies any childhood respiratory illness or diagnosis.  Denies recent fevers, chills, chest pain.  She cannot identify any triggers to her symptoms.  He does note that her respiratory symptoms are preceded by sinus congestion.    Never smoker  I have personally reviewed patient's past medical/family/social history, allergies, current medications.  Past Medical History:  Diagnosis Date  . Abdominal hernia   . Acute blood loss as cause of postoperative anemia 02/16/2017  . Arthritis    knees, back, R ankle   . Bilateral carpal tunnel syndrome 04/29/2012  . Diabetes mellitus   . Diabetes mellitus type 2 in obese ([place]) 12/31/2015  . Edema    legs  . GERD (gastroesophageal reflux disease)   . HOH (hard of hearing)   . Hypertension   . Lipoma 09/30/2012  . Obesity    morbid obesity  . OBESITY, MORBID 10/16/2007   Qualifier: Diagnosis of  By: Enrique Sack    . Osteoarthrosis involving lower leg 10/16/2007   Qualifier: Diagnosis of  By: Enrique Sack    . 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 Status post total right knee replacement 08/21/2017  .  Subclinical hypothyroidism 10/11/2016  . Tachycardia 08/22/2011  . Vertigo   . Vitamin D deficiency 02/16/2017     Family History  Problem Relation Age of Onset  . Hypertension Mother   . Kidney disease Mother   . Diabetes Father   . Hypertension Father   . Diabetes Sister   . Hypertension Sister   . Diabetes Brother   . Hypertension Brother   . Heart disease Brother 49       heart attack  . Hypertension Sister      Social History   Socioeconomic History  . Marital status: Single    Spouse name: Not on file  . Number of children: 2  . Years of education: Not on file  . Highest education level: Not on file  Occupational History  . 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 Financial resource strain: Not on file  . Food insecurity    Worry: Not on file    Inability: Not on file  . Transportation needs    Medical: Not on file    Non-medical: Not on file  Tobacco Use  . Smoking status: Never Smoker  . Smokeless tobacco: Never Used  Substance and Sexual Activity  . Alcohol use: No  . Drug use: No  . Sexual activity: Never  Lifestyle  . Physical activity    Days per week: Not on file    Minutes per session: Not on file  . Stress: Not on file  Relationships  .  Social Herbalist on phone: Not on file    Gets together: Not on file    Attends religious service: Not on file    Active member of club or organization: Not on file    Attends meetings of clubs or organizations: Not on file    Relationship status: Not on file  . Intimate partner violence    Fear of current or ex partner: Not on file    Emotionally abused: Not on file    Physically abused: Not on file    Forced sexual activity: Not on file  Other Topics Concern  . Not on file  Social History Narrative   Admitted to [facility] [facility] 08/24/17   Divorced   Jehovah Witness    Never smoked   Alcohol none   Full Code     Allergies  Allergen Reactions  .  Other Other (See Comments)    NO Blood Transfusions, or giving blood  . Sulfa Antibiotics Rash     Outpatient Medications Prior to Visit  Medication Sig Dispense Refill  . albuterol (VENTOLIN HFA) 108 (90 Base) MCG/ACT inhaler Inhale 2 puffs into the lungs every 6 (six) hours as needed for wheezing or shortness of breath. 1 Inhaler 0  . aspirin EC 81 MG tablet Take 81 mg by mouth daily.    . benzonatate (TESSALON) 100 MG capsule Take 1 capsule (100 mg total) by mouth 3 (three) times daily as needed for cough. 30 capsule 0  . Cyanocobalamin (VITAMIN B-12 PO) Take by mouth.    . famotidine (PEPCID) 20 MG tablet TAKE 1 TABLET BY MOUTH TWO  TIMES DAILY 180 tablet 0  . fluticasone furoate-vilanterol (BREO ELLIPTA) 200-25 MCG/INH AEPB Inhale 1 puff into the lungs daily. 2 each 0  . glimepiride (AMARYL) 2 MG tablet Take 1 tablet (2 mg total) by mouth daily before breakfast. 30 tablet 1  . glimepiride (AMARYL) 2 MG tablet TAKE 1 TABLET BY MOUTH  DAILY WITH BREAKFAST 90 tablet 0  . linagliptin (TRADJENTA) 5 MG TABS tablet Take 1 tablet (5 mg total) by mouth daily. 30 tablet 0  . losartan-hydrochlorothiazide (HYZAAR) 100-12.5 MG tablet TAKE 1 TABLET BY MOUTH  DAILY 90 tablet 3  . meclizine (ANTIVERT) 25 MG tablet Take 1 tablet (25 mg total) by mouth 3 (three) times daily as needed for dizziness. 21 tablet 0  . metFORMIN (GLUCOPHAGE) 1000 MG tablet TAKE 1 TABLET BY MOUTH  TWICE DAILY WITH MEALS 180 tablet 3  . Multiple Vitamin (MULTIVITAMIN) tablet Take 1 tablet by mouth every other day.     . Pitavastatin Calcium (LIVALO) 4 MG TABS Take 1 tablet (4 mg total) by mouth daily. 90 tablet 1   No facility-administered medications prior to visit.     ROS    Objective:  Physical Exam   There were no vitals filed for this visit. Physical Exam: General: Morbid obesity, well-appearing, no acute distress HENT: [place], AT Eyes: EOMI, no scleral icterus Respiratory: Mild rhonchi with inspiratory breath.   No wheezing Cardiovascular: RRR, -M/R/G, no JVD GI: BS+, soft, nontender Extremities: Nonpitting lower extremity edema,-tenderness Neuro: AAO x4, CNII-XII grossly intact Skin: Intact, no rashes or bruising Psych: Normal mood, normal affect  Chest imaging: CXR 05/13/2019-c okay new order PFTs entral bronchial thickening  PFT: None on file  I have personally reviewed the above labs, images and tests noted above.    Assessment & Plan:   Chronic bronchitis  Discussion: 69 year old  female never smoker with diabetes, GERD and hypertension who presents as a new consult for chronic bronchitis x2 months.  Not improved on antibiotics.  Has only tried albuterol.  Will manage symptoms with bronchodilators and allergy regimen as below.  For you chronic bronchitis: --START Breo 200-25 mcg 1 puff daily --CONTINUE Albuterol inhaler 2 puffs every 4 hours as needed for shortness of breath or wheezing --STOP xyzal and RESUME Claritin for allergies --START Flonase 1 spray per nare daily --Obtain pulmonary function tests to rule out obstructive disease --Order Ladona Ridgel for cough  Follow-up in 3 months   [place], MD [facility] 08/26/2019 11:28 AM

## 2019-09-01 ENCOUNTER — Telehealth: Payer: Self-pay | Admitting: Pulmonary Disease

## 2019-09-01 ENCOUNTER — Other Ambulatory Visit: Payer: Self-pay | Admitting: Pulmonary Disease

## 2019-09-01 NOTE — Telephone Encounter (Signed)
Scheduled patient for pre-procedure covid testing. Nothing further needed at this time.

## 2019-09-02 ENCOUNTER — Encounter: Payer: Self-pay | Admitting: Orthopaedic Surgery

## 2019-09-02 ENCOUNTER — Ambulatory Visit (INDEPENDENT_AMBULATORY_CARE_PROVIDER_SITE_OTHER): Payer: Medicare Other | Admitting: Orthopaedic Surgery

## 2019-09-02 DIAGNOSIS — M48061 Spinal stenosis, lumbar region without neurogenic claudication: Secondary | ICD-10-CM | POA: Diagnosis not present

## 2019-09-02 DIAGNOSIS — M545 Low back pain, unspecified: Secondary | ICD-10-CM

## 2019-09-02 DIAGNOSIS — G8929 Other chronic pain: Secondary | ICD-10-CM

## 2019-09-02 NOTE — Progress Notes (Signed)
The patient comes in today to go over an MRI of her lumbar spine.  She is someone with a BMI of well over 40.  She is actually gained weight she states due to pulmonary issues and having to take inhalers that have steroids in them.  She still denies any sciatic symptoms or radicular components of her pain.  We will send her for an MRI to determine whether or not she would benefit from facet joint injections.  She says her back feels "tired all the time".  It hurts mainly with activities.  She denies any change in bowel bladder function again denies any weakness in her legs.  She has been through physical therapy before.  She also reports that she is actually gained weight.  She gets out of a chair slowly.  She has pain in the midline of her lumbar spine and paraspinal muscles with flexion and extension.  She has negative straight leg raise bilaterally and normal strength and sensation in her bilateral extremities.  MRI of her lumbar spine does show significant facet disease at L3-L4, L4-L5, and L5-S1.  There is an extraforaminal disc protrusion to the right at L3-4.  There are osteophytes to the left-sided L5-S1.  However clinically, she does not seem to be having any issues except for the facet joints of her back.  She understands that her obesity is the main driving force and causing back issues for her.  She needs referral to a nutritionist in a bariatric clinic to consider modalities that can help with her weight loss.  Right now I would not recommend any type of steroid injection in her back since she has gained weight and given the fact that it is hard to pinpoint where she is having her pain it seems more of its just discomfort as it relates to her weight.  She is not interested in any more physical therapy because she says that has not helped her.  From my standpoint, I think weight loss will be the most important thing for her.  All question concerns were answered and addressed.  Follow-up is as  needed.

## 2019-09-03 ENCOUNTER — Other Ambulatory Visit: Payer: Self-pay

## 2019-09-03 DIAGNOSIS — E669 Obesity, unspecified: Secondary | ICD-10-CM

## 2019-09-05 ENCOUNTER — Other Ambulatory Visit (HOSPITAL_COMMUNITY)
Admission: RE | Admit: 2019-09-05 | Discharge: 2019-09-05 | Disposition: A | Discharge status: Home / Self Care | Payer: Medicare Other | Source: Ambulatory Visit | Attending: Pulmonary Disease | Admitting: Pulmonary Disease

## 2019-09-05 DIAGNOSIS — Z01812 Encounter for preprocedural laboratory examination: Secondary | ICD-10-CM | POA: Diagnosis not present

## 2019-09-05 DIAGNOSIS — Z20828 Contact with and (suspected) exposure to other viral communicable diseases: Secondary | ICD-10-CM | POA: Diagnosis not present

## 2019-09-05 LAB — SARS CORONAVIRUS 2 (TAT 6-24 HRS): SARS Coronavirus 2: NEGATIVE

## 2019-09-08 ENCOUNTER — Encounter: Payer: Self-pay | Admitting: Pulmonary Disease

## 2019-09-08 ENCOUNTER — Ambulatory Visit (INDEPENDENT_AMBULATORY_CARE_PROVIDER_SITE_OTHER): Payer: Medicare Other | Admitting: Pulmonary Disease

## 2019-09-08 ENCOUNTER — Ambulatory Visit: Payer: Medicare Other | Admitting: Pulmonary Disease

## 2019-09-08 ENCOUNTER — Other Ambulatory Visit: Payer: Self-pay

## 2019-09-08 VITALS — BP 126/78 | HR 103 | Temp 97.0°F | Ht 67.0 in | Wt 336.2 lb

## 2019-09-08 DIAGNOSIS — J42 Unspecified chronic bronchitis: Secondary | ICD-10-CM

## 2019-09-08 MED ORDER — BREO ELLIPTA 100-25 MCG/INH IN AEPB: 1.0000 | INHALATION_SPRAY | Freq: Every day | RESPIRATORY_TRACT | 0 refills | Status: DC

## 2019-09-08 NOTE — Patient Instructions (Signed)
For you chronic bronchitis: --START Breo 100-25 mcg one puff daily --CONTINUE Albuterol inhaler 2 puffs every 4 hours as needed for shortness of breath or wheezing --CONTINUE Claritin for allergies --CONTINUE Flonase 1 spray per nare daily

## 2019-09-08 NOTE — Progress Notes (Signed)
Subjective:   PATIENT ID: [name] Griebel GENDER: female DOB: 1951/07/05, MRN: [id number]   HPI  Chief Complaint  Patient presents with  . Follow-up    cough   Ms. Junetta Kamada is a 68 year old female never smoker with chronic bronchitis, allergic rhinitis, DM, GERD and HTN who presents for follow-up.  Since our last visit in 06/05/19, she reports she overall feels like her chronic bronchitis has improved by 75% but persistent including dyspnea on exertion, cough and wheezing. Her productive cough is her main symptom especially in the morning. Every once in awhile associated with wheezing. She was unable to complete her PFTs. She is not currently taking any bronchodilators. Denies fevers, chills. Reports nasal congestion.  I have personally reviewed patient's past medical/family/social history/allergies/current medications.  Past Medical History:  Diagnosis Date  . Abdominal hernia   . Acute blood loss as cause of postoperative anemia 02/16/2017  . Arthritis    knees, back, R ankle   . Bilateral carpal tunnel syndrome 04/29/2012  . Diabetes mellitus   . Diabetes mellitus type 2 in obese ([place]) 12/31/2015  . Edema    legs  . GERD (gastroesophageal reflux disease)   . HOH (hard of hearing)   . Hypertension   . Lipoma 09/30/2012  . Obesity    morbid obesity  . OBESITY, MORBID 10/16/2007   Qualifier: Diagnosis of  By: Enrique Sack    . Osteoarthrosis involving lower leg 10/16/2007   Qualifier: Diagnosis of  By: Enrique Sack    . 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 Status post total right knee replacement 08/21/2017  . Subclinical hypothyroidism 10/11/2016  . Tachycardia 08/22/2011  . Vertigo   . Vitamin D deficiency 02/16/2017     Family History  Problem Relation Age of Onset  . Hypertension Mother   . Kidney disease Mother   . Diabetes Father   . Hypertension Father   . Diabetes Sister   . Hypertension Sister   . Diabetes Brother   . Hypertension  Brother   . Heart disease Brother 52       heart attack  . Hypertension Sister      Social History   Socioeconomic History  . Marital status: Single    Spouse name: Not on file  . Number of children: 2  . Years of education: Not on file  . Highest education level: Not on file  Occupational History  . 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 Financial resource strain: Not on file  . Food insecurity    Worry: Not on file    Inability: Not on file  . Transportation needs    Medical: Not on file    Non-medical: Not on file  Tobacco Use  . Smoking status: Never Smoker  . Smokeless tobacco: Never Used  Substance and Sexual Activity  . Alcohol use: No  . Drug use: No  . Sexual activity: Never  Lifestyle  . Physical activity    Days per week: Not on file    Minutes per session: Not on file  . Stress: Not on file  Relationships  . Social Herbalist on phone: Not on file    Gets together: Not on file    Attends religious service: Not on file    Active member of club or organization: Not on file    Attends meetings of clubs or organizations: Not on file  Relationship status: Not on file  . Intimate partner violence    Fear of current or ex partner: Not on file    Emotionally abused: Not on file    Physically abused: Not on file    Forced sexual activity: Not on file  Other Topics Concern  . Not on file  Social History Narrative   Admitted to [facility] 08/24/17   Divorced   Jehovah Witness    Never smoked   Alcohol none   Full Code     Allergies  Allergen Reactions  . Other Other (See Comments)    NO Blood Transfusions, or giving blood  . Sulfa Antibiotics Rash     Outpatient Medications Prior to Visit  Medication Sig Dispense Refill  . aspirin EC 81 MG tablet Take 81 mg by mouth daily.    . benzonatate (TESSALON) 100 MG capsule Take 1 capsule (100 mg total) by mouth 3 (three) times daily as needed  for cough. 30 capsule 0  . Cyanocobalamin (VITAMIN B-12 PO) Take by mouth.    . famotidine (PEPCID) 20 MG tablet TAKE 1 TABLET BY MOUTH TWO  TIMES DAILY 180 tablet 0  . glimepiride (AMARYL) 2 MG tablet Take 1 tablet (2 mg total) by mouth daily before breakfast. 30 tablet 1  . glimepiride (AMARYL) 2 MG tablet TAKE 1 TABLET BY MOUTH  DAILY WITH BREAKFAST 90 tablet 0  . linagliptin (TRADJENTA) 5 MG TABS tablet Take 1 tablet (5 mg total) by mouth daily. 30 tablet 0  . losartan-hydrochlorothiazide (HYZAAR) 100-12.5 MG tablet TAKE 1 TABLET BY MOUTH  DAILY 90 tablet 3  . meclizine (ANTIVERT) 25 MG tablet Take 1 tablet (25 mg total) by mouth 3 (three) times daily as needed for dizziness. 21 tablet 0  . metFORMIN (GLUCOPHAGE) 1000 MG tablet TAKE 1 TABLET BY MOUTH  TWICE DAILY WITH MEALS 180 tablet 3  . Multiple Vitamin (MULTIVITAMIN) tablet Take 1 tablet by mouth every other day.     . Pitavastatin Calcium (LIVALO) 4 MG TABS Take 1 tablet (4 mg total) by mouth daily. 90 tablet 1  . albuterol (VENTOLIN HFA) 108 (90 Base) MCG/ACT inhaler Inhale 2 puffs into the lungs every 6 (six) hours as needed for wheezing or shortness of breath. (Patient not taking: Reported on 09/08/2019) 1 Inhaler 0  . fluticasone furoate-vilanterol (BREO ELLIPTA) 200-25 MCG/INH AEPB Inhale 1 puff into the lungs daily. (Patient not taking: Reported on 09/08/2019) 2 each 0   No facility-administered medications prior to visit.     Review of Systems  Constitutional: Negative for chills, diaphoresis, fever, malaise/fatigue and weight loss.  HENT: Positive for congestion. Negative for ear pain and sore throat.   Respiratory: Positive for cough, sputum production, shortness of breath and wheezing. Negative for hemoptysis.   Cardiovascular: Negative for chest pain, palpitations and leg swelling.  Gastrointestinal: Negative for abdominal pain, heartburn and nausea.  Genitourinary: Negative for frequency.  Musculoskeletal: Negative for  joint pain and myalgias.  Skin: Negative for itching and rash.  Neurological: Negative for dizziness, weakness and headaches.  Endo/Heme/Allergies: Does not bruise/bleed easily.  Psychiatric/Behavioral: Negative for depression. The patient is not nervous/anxious.       Objective:   Vitals:   09/08/19 1141  BP: 126/78  Pulse: (!) 103  Temp: (!) 97 F (36.1 C)  TempSrc: Temporal  SpO2: 91%  Weight: (!) 336 lb 3.2 oz (152.5 kg)  Height: 5\' 7"  (1.702 m)   Physical Exam: General: Well-appearing,  no acute distress HENT: [place], AT, OP clear, MMM Eyes: EOMI, no scleral icterus Respiratory: Clear to auscultation bilaterally.  No crackles, wheezing or rales Cardiovascular: RRR, -M/R/G, no JVD GI: BS+, soft, nontender Extremities:-Edema,-tenderness Neuro: AAO x4, CNII-XII grossly intact Skin: Intact, no rashes or bruising Psych: Normal mood, normal affect  Chest imaging: CXR 05/13/2019-central bronchial thickening  PFT: None on file  Imaging, labs and test noted above have been reviewed independently by me.    Assessment & Plan:   Chronic bronchitis  Discussion: 68  year old female never smoker with chronic bronchitis, allergic rhinitis, DM, GERD and HTN who presents for follow-up for chronic bronchitis. Overall improved with mild symptoms. Previously improved on bronchodilator therapy. Unable to perform PFTs today.   For you chronic bronchitis: --RESTART Breo 100-25 mcg one puff daily --CONTINUE Albuterol inhaler 2 puffs every 4 hours as needed for shortness of breath or wheezing --CONTINUE Claritin for allergies --CONTINUE Flonase 1 spray per nare daily  Immunization History  Administered Date(s) Administered  . Fluad Quad(high Dose 65+) 09/10/2019  . Influenza, High Dose Seasonal PF 10/28/2018  . Influenza,inj,Quad PF,6+ Mos 09/21/2016, 10/03/2017  . PPD Test 02/09/2017  . Pneumococcal Conjugate-13 10/28/2018  . Pneumococcal Polysaccharide-23 08/01/2006  . Td  12/21/2003   No orders of the defined types were placed in this encounter.  Meds ordered this encounter  Medications  . fluticasone furoate-vilanterol (BREO ELLIPTA) 100-25 MCG/INH AEPB    Sig: Inhale 1 puff into the lungs daily.    Dispense:  1 each    Refill:  0    Order Specific Question:   Lot Number?    Answer:   [id number]    Order Specific Question:   Expiration Date?    Answer:   02/16/2020    Order Specific Question:   Manufacturer?    Answer:   [employer] [[id number]]    Order Specific Question:   Quantity    Answer:   1   Return in about 3 months (around 12/08/2019).   Greater than 50% of this patient 25-minute office visit was spent face-to-face in counseling with the patient/family. We discussed medical diagnosis and treatment plan as noted.  Dailey Buccheri Rodman Pickle, MD [facility] 09/08/2019 12:01 PM

## 2019-09-10 ENCOUNTER — Encounter: Payer: Self-pay | Admitting: Family Medicine

## 2019-09-10 ENCOUNTER — Other Ambulatory Visit: Payer: Self-pay

## 2019-09-10 ENCOUNTER — Telehealth: Payer: Self-pay | Admitting: Internal Medicine

## 2019-09-10 ENCOUNTER — Ambulatory Visit (INDEPENDENT_AMBULATORY_CARE_PROVIDER_SITE_OTHER): Payer: Medicare Other | Admitting: Family Medicine

## 2019-09-10 VITALS — BP 122/82 | HR 77 | Temp 97.1°F | Wt 336.4 lb

## 2019-09-10 DIAGNOSIS — I10 Essential (primary) hypertension: Secondary | ICD-10-CM

## 2019-09-10 DIAGNOSIS — E669 Obesity, unspecified: Secondary | ICD-10-CM

## 2019-09-10 DIAGNOSIS — Z23 Encounter for immunization: Secondary | ICD-10-CM | POA: Diagnosis not present

## 2019-09-10 DIAGNOSIS — E781 Pure hyperglyceridemia: Secondary | ICD-10-CM

## 2019-09-10 DIAGNOSIS — E1169 Type 2 diabetes mellitus with other specified complication: Secondary | ICD-10-CM

## 2019-09-10 DIAGNOSIS — N189 Chronic kidney disease, unspecified: Secondary | ICD-10-CM

## 2019-09-10 DIAGNOSIS — E785 Hyperlipidemia, unspecified: Secondary | ICD-10-CM

## 2019-09-10 LAB — POCT GLYCOSYLATED HEMOGLOBIN (HGB A1C): Hemoglobin A1C: 6.8 % — AB (ref 4.0–5.6)

## 2019-09-10 MED ORDER — LIVALO 4 MG PO TABS: ORAL_TABLET | ORAL | 1 refills | Status: DC

## 2019-09-10 MED ORDER — LINAGLIPTIN 5 MG PO TABS: 5.0000 mg | ORAL_TABLET | Freq: Every day | ORAL | 1 refills | Status: DC

## 2019-09-10 NOTE — Telephone Encounter (Signed)

## 2019-09-10 NOTE — Telephone Encounter (Signed)
She showed up very late and was seen.

## 2019-09-10 NOTE — Progress Notes (Signed)
Subjective:    Patient ID: [name], female    DOB: March 01, 1951, 68 y.o.   MRN: [id number]  [name] is a 68 y.o. female who presents for follow-up of Type 2 diabetes mellitus and other chronic health conditions.   States she has gained weight. States Dr. Ninfa Linden referred her to weight managment. States she needs to lose weight to help with her back pain.   Taking pitavasatin twice monthly. She decided this regimen. Has failed other statins in the past but doing fine with this one.   Hgb A1c 7.5%  In June 2020.   Patient is checking home blood sugars.   Home blood sugar records: BGs range between 92 and 140 How often is blood sugars being checked: twice a month Current symptoms include: none. Patient denies increased appetite, nausea, visual disturbances, vomiting and weight loss.  Patient is checking their feet daily. Any Foot concerns (callous, ulcer, wound, thickened nails, toenail fungus, skin fungus, hammer toe): none Last dilated eye exam: February 2020  Current treatments: doing well on DM meds. Taking Metformin 1,000 mg twice daily, glimepiride 2 mg once daily and we added Tradjenta 5 mg in June 2020. She followed this regimen for one month but did not get refill of Tradjenta after she ran out of samples. She went back to 4 mg of glimepiride for the past 2 months.   Medication compliance: good but changed her regimen and did not consult me Current diet: in general, a "healthy" diet   states she is not eating very often  Current exercise: squating and kicking legs out in chair Known diabetic complications: none  The following portions of the patient's history were reviewed and updated as appropriate: allergies, current medications, past medical history, past social history and problem list.  ROS as in subjective above.     Objective:    Physical Exam Alert and in no distress otherwise not examined.  Blood pressure 122/82, pulse 77, temperature (!) 97.1 F (36.2  C), weight (!) 336 lb 6.4 oz (152.6 kg), SpO2 97 %.  Lab Review Diabetic Labs Latest Ref Rng & Units 09/10/2019 06/09/2019 10/28/2018 05/03/2018 10/05/2017  HbA1c 4.0 - 5.6 % 6.8(A) 7.5(A) 7.3(H) 7.0 -  Microalbumin mg/dL - - - - -  Micro/Creat Ratio <30 mcg/mg creat - - - - -  Chol 100 - 199 mg/dL - - 172 176 150  HDL >39 mg/dL - - 41 40 34(L)  Calc LDL 0 - 99 mg/dL - - 99 104(H) 91  Triglycerides 0 - 149 mg/dL - - 161(H) 161(H) 146  Creatinine 0.57 - 1.00 mg/dL - 1.24(H) 1.23(H) 1.23(H) 1.30(H)   BP/Weight 09/10/2019 09/08/2019 06/09/2019 06/05/2019 [id number]  Systolic BP 123XX123 123XX123 [id number] 123XX123 [id number]  Diastolic BP 82 78 80 80 64  Wt. (Lbs) 336.4 336.2 335 338 -  BMI 52.69 52.66 54.07 54.55 -   Foot/eye exam completion dates Latest Ref Rng & Units 02/04/2019 10/28/2018  Eye Exam No Retinopathy No Retinopathy -  Foot Form Completion - - Done    [name]  reports that she has never smoked. She has never used smokeless tobacco. She reports that she does not drink alcohol or use drugs.     Assessment & Plan:    Diabetes mellitus type 2 in obese ([place]) - Plan: HgB A1c, Microalbumin / creatinine urine ratio  Hyperlipidemia associated with type 2 diabetes mellitus (H[facility])  Essential hypertension  Chronic kidney disease, unspecified CKD stage  Hypertriglyceridemia  Severe obesity (BMI >=  40) ([place])  Needs flu shot - Plan: Flu Vaccine QUAD High Dose(Fluad)  1. Rx changes: enouraged her to go back to Tradjenta 5 mg, glimepiride 2 mg and Metformin 1,000 mg bid 2. Education: Reviewed 'ABCs' of diabetes management (respective goals in parentheses):  A1C (<7), blood pressure (<130/80), and cholesterol (LDL <100). 3. Compliance at present is estimated to be good. Efforts to improve compliance (if necessary) will be directed at dietary modifications: limit sweets and carbohydrates. will appreciate help from [facility] weight management, increased exercise and regular blood sugar monitoring: daily. 4. HTN- BP  controlled. Continue current regimen of Hyzaar 100-12.5 mg 5. HL- she is taking pitavastatin 4 mg twice monthly. Encouraged her to take this twice weekly  6. Follow up: 6 months or sooner for AWV

## 2019-09-10 NOTE — Patient Instructions (Addendum)
Your hemoglobin A1c is 6.8% and has improved since June.  I recommend that you continue on the metformin 1000 mg twice daily and Tradjenta 5 mg once daily.  Take half of the glimepiride dose (2 mg).  Glimepiride is the medication that causes a sudden drop in your blood sugar.  I sent in a prescription to your pharmacy for [place]. Let me know if they do not have it.   Continue checking your blood sugar 1-2 times daily and vary the times in which you take it.  If you ever feel like you have a low blood sugar, check it to see.   Continue on your other medications as prescribed.  You have been referred to the [facility] weight management clinic and they will call you over the next few weeks to schedule an appointment.  Follow-up here for a fasting Medicare wellness visit before the end of the year.

## 2019-09-11 LAB — MICROALBUMIN / CREATININE URINE RATIO
Creatinine, Urine: 131.1 mg/dL
Microalb/Creat Ratio: 15 mg/g creat (ref 0–29)
Microalbumin, Urine: 20.2 ug/mL

## 2019-09-15 ENCOUNTER — Other Ambulatory Visit: Payer: Self-pay | Admitting: Family Medicine

## 2019-09-29 ENCOUNTER — Encounter: Payer: Self-pay | Admitting: Family Medicine

## 2019-11-25 ENCOUNTER — Ambulatory Visit: Payer: Medicare Other | Admitting: Family Medicine

## 2019-12-11 ENCOUNTER — Other Ambulatory Visit: Payer: Self-pay

## 2019-12-11 NOTE — Patient Outreach (Signed)
[name]) Care Management  12/11/2019  [name] 06-01-1951 [id number]   Medication Adherence call to [name] Barnett HIPPA Compliant Voice message left with a call back number. [name] is showing past due on Livalo 4 mg under [name].   [facility] Management Direct Dial [phone]  Fax [phone] [email or web address]

## 2019-12-15 ENCOUNTER — Other Ambulatory Visit: Payer: Self-pay

## 2019-12-15 NOTE — Patient Outreach (Signed)
[facility]) Care Management  12/15/2019  [name] Feb 06, 1951 [id number]  Medication Adherence call to [name] Compliant Voice message left with a call back number. Mrs. Scale is showing past due on Livalo 4 mg under [place].  [facility] Management Direct Dial [phone]  Fax [phone] [email or web address]

## 2019-12-18 ENCOUNTER — Other Ambulatory Visit: Payer: Self-pay

## 2019-12-18 NOTE — Patient Outreach (Signed)
[facility]) Care Management  12/18/2019  Stefana Vicencio Archuleta 12-Jul-1951 [id number]   Medication Adherence call to Mrs. Manuela Schwartz Scale HIPPA Compliant Voice message left with a call back number. Mrs. Scale is showing past due on Livalo 4 mg under [place].   [place] Management Direct Dial [phone]  Fax [phone] [email or web address]

## 2019-12-22 ENCOUNTER — Other Ambulatory Visit: Payer: Self-pay

## 2019-12-22 NOTE — Patient Outreach (Signed)
[facility]) Care Management  12/22/2019  Shelvey Staebell Domingos 08/28/1951 [id number]   Medication Adherence call to Mrs. [name] Scale HIPPA Compliant Voice message left with a call back number. Mrs. Scale is showing past due on Livalo 4 mg under [place].   [place] Management Direct Dial [phone]  Fax [phone] [email or web address]

## 2019-12-29 ENCOUNTER — Other Ambulatory Visit: Payer: Self-pay

## 2019-12-29 NOTE — Patient Outreach (Signed)
[name]  12/29/2019  [name] January 09, 1951 [id number]   Medication Adherence call to [name] Scale HIPPA Compliant Voice message left with a call back number. Mrs. Scale is showing past due on Livalo 4 mg under [place].   [place] Barnett Direct Dial [phone]  Fax [phone] [name]

## 2020-01-01 ENCOUNTER — Ambulatory Visit (INDEPENDENT_AMBULATORY_CARE_PROVIDER_SITE_OTHER): Payer: Medicare Other | Admitting: Family Medicine

## 2020-01-01 ENCOUNTER — Encounter: Payer: Self-pay | Admitting: Family Medicine

## 2020-01-01 ENCOUNTER — Other Ambulatory Visit: Payer: Self-pay

## 2020-01-01 VITALS — BP 140/80 | HR 107 | Temp 98.0°F | Ht 67.5 in | Wt 335.6 lb

## 2020-01-01 DIAGNOSIS — E785 Hyperlipidemia, unspecified: Secondary | ICD-10-CM

## 2020-01-01 DIAGNOSIS — E039 Hypothyroidism, unspecified: Secondary | ICD-10-CM

## 2020-01-01 DIAGNOSIS — E669 Obesity, unspecified: Secondary | ICD-10-CM | POA: Diagnosis not present

## 2020-01-01 DIAGNOSIS — E559 Vitamin D deficiency, unspecified: Secondary | ICD-10-CM

## 2020-01-01 DIAGNOSIS — N189 Chronic kidney disease, unspecified: Secondary | ICD-10-CM | POA: Diagnosis not present

## 2020-01-01 DIAGNOSIS — Z789 Other specified health status: Secondary | ICD-10-CM | POA: Insufficient documentation

## 2020-01-01 DIAGNOSIS — Z0000 Encounter for general adult medical examination without abnormal findings: Secondary | ICD-10-CM

## 2020-01-01 DIAGNOSIS — I10 Essential (primary) hypertension: Secondary | ICD-10-CM

## 2020-01-01 DIAGNOSIS — Z23 Encounter for immunization: Secondary | ICD-10-CM | POA: Diagnosis not present

## 2020-01-01 DIAGNOSIS — E1169 Type 2 diabetes mellitus with other specified complication: Secondary | ICD-10-CM | POA: Diagnosis not present

## 2020-01-01 DIAGNOSIS — J3089 Other allergic rhinitis: Secondary | ICD-10-CM

## 2020-01-01 DIAGNOSIS — K219 Gastro-esophageal reflux disease without esophagitis: Secondary | ICD-10-CM | POA: Diagnosis not present

## 2020-01-01 DIAGNOSIS — E038 Other specified hypothyroidism: Secondary | ICD-10-CM

## 2020-01-01 LAB — POCT GLYCOSYLATED HEMOGLOBIN (HGB A1C): Hemoglobin A1C: 6.6 % — AB (ref 4.0–5.6)

## 2020-01-01 NOTE — Progress Notes (Signed)
[name] is a 69 y.o. female who presents for annual wellness visit, CPE and follow-up on chronic medical conditions.  She has the following concerns:  Morbid obesity- states she did not hear from [facility] weight management. Dr. Ninfa Barnett referred her in fall of 2020.  She would like to hear from them.   HTN- does not check BP at home. Did not take her medication today.   Diabetes-  FBS 130-140 at home.  Metformin 1,000 mg twice daily. Glimepiride. Is not taking Tradjenta.   Diabetic eye exam February 2020   States she is taking Livalo once weekly. States she has cramping in her thigh after taking this medication.   States history of PAD in her chart was checked by vascular and normal work-up  History of vitamin D deficiency and is taking a supplement  Allergies-takes medication as needed  Denies history of sleep apnea and does not think she has this  States she is a Engineer, manufacturing systems History  Administered Date(s) Administered  . Fluad Quad(high Dose 65+) 09/10/2019  . Influenza, High Dose Seasonal PF 10/28/2018  . Influenza,inj,Quad PF,6+ Mos 09/21/2016, 10/03/2017  . PPD Test 02/09/2017  . Pneumococcal Conjugate-13 10/28/2018  . Pneumococcal Polysaccharide-23 08/01/2006, 01/01/2020  . Td 12/21/2003   Last Pap smear: 2017  Last mammogram: 2020 Last colonoscopy: 2015 at [place]: 2020 Dentist: 2020. Partial  Ophtho: 2020 no retinopathy  Exercise: nothing currently. Back pain.   Other doctors caring for patient include: Eye- Dr. Einar Barnett Back[facility]inf[facility] Pulmonologist- Dr. Margaretha Barnett    Depression screen:  See questionnaire below.  Depression screen [facility] 2/9 01/01/2020 10/28/2018 10/03/2017 09/21/2016 09/22/2015  Decreased Interest 0 0 0 0 0  Down, Depressed, Hopeless 0 0 0 0 0  PHQ - 2 Score 0 0 0 0 0    Fall Risk Screen: see questionnaire below. Fall Risk  01/01/2020 10/28/2018 10/03/2017 09/21/2016 09/22/2015  Falls in  the past year? 0 0 No No No  Number falls in past yr: 0 - - - -  Injury with Fall? 0 - - - -    ADL screen:  See questionnaire below Functional Status Survey: Is the patient deaf or have difficulty hearing?: Yes(some sharp pain with ear) Does the patient have difficulty seeing, even when wearing glasses/contacts?: No Does the patient have difficulty concentrating, remembering, or making decisions?: Yes(sometimes) Does the patient have difficulty walking or climbing stairs?: Yes Does the patient have difficulty dressing or bathing?: No Does the patient have difficulty doing errands alone such as visiting a doctor's office or shopping?: No   End of Life Discussion:  Patient has a living will and medical power of attorney. Her son, [name], is her HCPOA. States she has a living will at home. MOST form reviewed and she now wants to be a DNR.  DNR gold sheet filled out, original sent with patient  Review of Systems Constitutional: -fever, -chills, -sweats, -unexpected weight change, -anorexia, -fatigue Allergy: -sneezing, -itching, -congestion Dermatology: denies changing moles, rash, lumps, new worrisome lesions ENT: -runny nose, -ear pain, -sore throat, -hoarseness, -sinus pain, -teeth pain, -tinnitus, -hearing loss, -epistaxis Cardiology:  -chest pain, -palpitations, -edema, -orthopnea, -paroxysmal nocturnal dyspnea Respiratory: -cough, -shortness of breath, +dyspnea on exertion, -wheezing, -hemoptysis Gastroenterology: -abdominal pain, -nausea, -vomiting, -diarrhea, -constipation, -blood in stool, -changes in bowel movement, -dysphagia Hematology: -bleeding or bruising problems Musculoskeletal: -arthralgias, -myalgias, -joint swelling, -back pain, -neck pain, -cramping, -gait changes Ophthalmology: -  vision changes, -eye redness, -itching, -discharge Urology: -dysuria, -difficulty urinating, -hematuria, -urinary frequency, -urgency,- incontinence Neurology: -headache, -weakness,  -tingling, -numbness, -speech abnormality, -memory loss, -falls, -dizziness Psychology:  -depressed mood, -agitation, -sleep problems    PHYSICAL EXAM:  BP 140/80   Pulse (!) 107   Temp 98 F (36.7 C)   Ht 5' 7.5" (1.715 m)   Wt (!) 335 lb 9.6 oz (152.2 kg)   LMP  (LMP Unknown)   BMI 51.79 kg/m   General Appearance: Alert, cooperative, no distress, appears stated age Head: Normocephalic, without obvious abnormality, atraumatic Eyes: PERRL, conjunctiva/corneas clear, EOM's intact Ears: Normal TM's and external ear canals Nose: Mask in place Throat: Mask in place Neck: Supple, no lymphadenopathy; thyroid: no enlargement/tenderness/nodules Back: Spine nontender, normal sensation and motion, no CVA tenderness Lungs: Clear to auscultation bilaterally without wheezes, rales or ronchi; respirations unlabored Heart: Regular rate and rhythm, no murmur, rub or gallop Breast Exam: Deferred Abdomen: Soft, non-tender, obese, nondistended, normoactive bowel sounds, no palpable masses Genitalia: Deferred Extremities: No clubbing, cyanosis or edema Pulses: 2+ and symmetric all extremities Skin: Skin color, texture, turgor normal, no rashes or lesions Lymph nodes: Cervical, supraclavicular, and axillary nodes normal Neurologic: CNII-XII intact, normal strength, sensation and gait; reflexes 2+ and symmetric throughout Psych: Normal mood, affect, hygiene and grooming.  ASSESSMENT/PLAN: Medicare annual wellness visit, subsequent -Denies falls, depression, memory concerns or difficulties with ADLs.  Advanced directives discussed in depth.  Routine general medical examination at a health care facility - Plan: CBC with Differential, Comprehensive metabolic panel -Reviewed preventive health care.  She will be due for mammogram this year.  Last Pap smear 2017.  She declines pelvic exam today.  Colonoscopy appears to be up-to-date.  Reviewed immunizations and updated.  Discussed safety and health  promotion.  Diabetes mellitus type 2 in obese ([place]) - Plan: HgB A1c, CBC with Differential, Comprehensive metabolic panel -Hgb 123456 [id number] today  -Well-controlled.  Continue on current medication.  Continue with low-carb diet.  Essential hypertension - Plan: CBC with Differential, Comprehensive metabolic panel -Blood pressure is not quite to goal today.  She is aware of this.  Recommend she checking her blood pressure at home and let me know if she is not seeing readings in goal range consistently.  Continue on current medication regimen for now.  Encourage low-sodium diet.  Subclinical hypothyroidism - Plan: TSH, T4, Free, T3 -Follow-up pending labs  Vitamin D deficiency - Plan: Vitamin D, 25-hydroxy -Check vitamin D level and adjust dose as appropriate  Gastroesophageal reflux disease without esophagitis -This is not currently bothersome  Hyperlipidemia associated with type 2 diabetes mellitus ([place]) - Plan: Lipid Panel -She has been intolerant to several statins.  Currently she is taking Livalo once weekly.  Check lipid panel and follow-up  Severe obesity (BMI >= 40) (HCC) -She was referred by Dr. Ninfa Barnett to [facility] weight management.  States she has not heard from them.  Encouraged her to let Dr. Trevor Mace office know this so that they may expedite an appointment.  Environmental and seasonal allergies-continue with medication as needed  Chronic kidney disease, unspecified CKD stage - Plan: Comprehensive metabolic panel -Recommend good blood pressure control.  Continue to monitor  Need for vaccination against Streptococcus pneumoniae - Plan: Pneumococcal polysaccharide vaccine 23-valent greater than or equal to 2yo subcutaneous/IM -Discussed potential side effects  Advanced directive placed in chart this admission -She is certain that she does not want to be resuscitated if she were to stop breathing or no  longer has a pulse.  We discussed this at length and a DNR form was  filled out for her.  She was given the original and we will scan and this advanced directive to her EMR.  I strongly recommend that she have this conversation with her son who is her healthcare power of attorney.  Discussed that she may change her mind at any point   Discussed monthly self breast exams and yearly mammograms; at least 30 minutes of aerobic activity at least 5 days/week and weight-bearing exercise 2x/week; proper sunscreen use reviewed; healthy diet, including goals of calcium and vitamin D intake and alcohol recommendations (less than or equal to 1 drink/day) reviewed; regular seatbelt use; changing batteries in smoke detectors.  Immunization recommendations discussed.  Colonoscopy recommendations reviewed   Medicare Attestation I have personally reviewed: The patient's medical and social history Their use of alcohol, tobacco or illicit drugs Their current medications and supplements The patient's functional ability including ADLs,fall risks, home safety risks, cognitive, and hearing and visual impairment Diet and physical activities Evidence for depression or mood disorders  The patient's weight, height, and BMI have been recorded in the chart.  I have made referrals, counseling, and provided education to the patient based on review of the above and I have provided the patient with a written personalized care plan for preventive services.     Harland Dingwall, NP-C   01/01/2020

## 2020-01-01 NOTE — Patient Instructions (Addendum)
Call and schedule a dental cleaning.  You will be due for a diabetic eye exam this year.   You are overdue for theTdap (tetanus, diptheria, and pertussis). You can get this at your pharmacy.   If you have not heard from [facility] in the next couple of weeks, let Dr. Rush Farmer know and they can check on this for you.    [name] , Thank you for taking time to come for your Medicare Wellness Visit. I appreciate your ongoing commitment to your health goals. Please review the following plan we discussed and let me know if I can assist you in the future.    This is a list of the screening recommended for you and due dates:  Health Maintenance  Topic Date Due  . Tetanus Vaccine  12/20/2013  . Pneumonia vaccines (2 of 2 - PPSV23) 10/29/2019  . Eye exam for diabetics  02/05/2020  . Hemoglobin A1C  06/30/2020  . Mammogram  12/27/2020  . Complete foot exam   12/31/2020  . Colon Cancer Screening  03/06/2024  . Flu Shot  Completed  . DEXA scan (bone density measurement)  Completed  .  Hepatitis C: One time screening is recommended by Center for Disease Control  (CDC) for  adults born from [birth year] through 1963.   Completed       Preventive Care 67 Years and Older, Female Preventive care refers to lifestyle choices and visits with your health care provider that can promote health and wellness. This includes:  A yearly physical exam. This is also called an annual well check.  Regular dental and eye exams.  Immunizations.  Screening for certain conditions.  Healthy lifestyle choices, such as diet and exercise. What can I expect for my preventive care visit? Physical exam Your health care provider will check:  Height and weight. These may be used to calculate body mass index (BMI), which is a measurement that tells if you are at a healthy weight.  Heart rate and blood pressure.  Your skin for abnormal spots. Counseling Your health care provider may ask you  questions about:  Alcohol, tobacco, and drug use.  Emotional well-being.  Home and relationship well-being.  Sexual activity.  Eating habits.  History of falls.  Memory and ability to understand (cognition).  Work and work Statistician.  Pregnancy and menstrual history. What immunizations do I need?  Influenza (flu) vaccine  This is recommended every year. Tetanus, diphtheria, and pertussis (Tdap) vaccine  You may need a Td booster every 10 years. Varicella (chickenpox) vaccine  You may need this vaccine if you have not already been vaccinated. Zoster (shingles) vaccine  You may need this after age 64. Pneumococcal conjugate (PCV13) vaccine  One dose is recommended after age 67. Pneumococcal polysaccharide (PPSV23) vaccine  One dose is recommended after age 67. Measles, mumps, and rubella (MMR) vaccine  You may need at least one dose of MMR if you were born in 1957 or later. You may also need a second dose. Meningococcal conjugate (MenACWY) vaccine  You may need this if you have certain conditions. Hepatitis A vaccine  You may need this if you have certain conditions or if you travel or work in places where you may be exposed to hepatitis A. Hepatitis B vaccine  You may need this if you have certain conditions or if you travel or work in places where you may be exposed to hepatitis B. Haemophilus influenzae type b (Hib) vaccine  You may need  this if you have certain conditions. You may receive vaccines as individual doses or as more than one vaccine together in one shot (combination vaccines). Talk with your health care provider about the risks and benefits of combination vaccines. What tests do I need? Blood tests  Lipid and cholesterol levels. These may be checked every 5 years, or more frequently depending on your overall health.  Hepatitis C test.  Hepatitis B test. Screening  Lung cancer screening. You may have this screening every year starting at  age 50 if you have a 30-pack-year history of smoking and currently smoke or have quit within the past 15 years.  Colorectal cancer screening. All adults should have this screening starting at age 67 and continuing until age 53. Your health care provider may recommend screening at age 67 if you are at increased risk. You will have tests every 1-10 years, depending on your results and the type of screening test.  Diabetes screening. This is done by checking your blood sugar (glucose) after you have not eaten for a while (fasting). You may have this done every 1-3 years.  Mammogram. This may be done every 1-2 years. Talk with your health care provider about how often you should have regular mammograms.  BRCA-related cancer screening. This may be done if you have a family history of breast, ovarian, tubal, or peritoneal cancers. Other tests  Sexually transmitted disease (STD) testing.  Bone density scan. This is done to screen for osteoporosis. You may have this done starting at age 67. Follow these instructions at home: Eating and drinking  Eat a diet that includes fresh fruits and vegetables, whole grains, lean protein, and low-fat dairy products. Limit your intake of foods with high amounts of sugar, saturated fats, and salt.  Take vitamin and mineral supplements as recommended by your health care provider.  Do not drink alcohol if your health care provider tells you not to drink.  If you drink alcohol: ? Limit how much you have to 0-1 drink a day. ? Be aware of how much alcohol is in your drink. In the [place] one drink equals one 12 oz bottle of beer (355 mL), one 5 oz glass of wine (148 mL), or one 1 oz glass of hard liquor (44 mL). Lifestyle  Take daily care of your teeth and gums.  Stay active. Exercise for at least 30 minutes on 5 or more days each week.  Do not use any products that contain nicotine or tobacco, such as cigarettes, e-cigarettes, and chewing tobacco. If you need  help quitting, ask your health care provider.  If you are sexually active, practice safe sex. Use a condom or other form of protection in order to prevent STIs (sexually transmitted infections).  Talk with your health care provider about taking a low-dose aspirin or statin. What's next?  Go to your health care provider once a year for a well check visit.  Ask your health care provider how often you should have your eyes and teeth checked.  Stay up to date on all vaccines. This information is not intended to replace advice given to you by your health care provider. Make sure you discuss any questions you have with your health care provider. Document Revised: 11/28/2018 Document Reviewed: 11/28/2018 Elsevier Patient Education  2020 [employer].

## 2020-01-02 LAB — COMPREHENSIVE METABOLIC PANEL
ALT: 9 IU/L (ref 0–32)
AST: 8 IU/L (ref 0–40)
Albumin/Globulin Ratio: 1.4 (ref 1.2–2.2)
Albumin: 4.2 g/dL (ref 3.8–4.8)
Alkaline Phosphatase: 72 IU/L (ref 39–117)
BUN/Creatinine Ratio: 15 (ref 12–28)
BUN: 17 mg/dL (ref 8–27)
Bilirubin Total: <0.2 mg/dL (ref 0.0–1.2)
CO2: 22 mmol/L (ref 20–29)
Calcium: 9.9 mg/dL (ref 8.7–10.3)
Chloride: 105 mmol/L (ref 96–106)
Creatinine, Ser: 1.11 mg/dL — ABNORMAL HIGH (ref 0.57–1.00)
GFR calc Af Amer: 60 mL/min/{1.73_m2} (ref >59)
GFR calc non Af Amer: 52 mL/min/{1.73_m2} — ABNORMAL LOW (ref >59)
Globulin, Total: 3 g/dL (ref 1.5–4.5)
Glucose: 114 mg/dL — ABNORMAL HIGH (ref 65–99)
Potassium: 4.8 mmol/L (ref 3.5–5.2)
Sodium: 142 mmol/L (ref 134–144)
Total Protein: 7.2 g/dL (ref 6.0–8.5)

## 2020-01-02 LAB — CBC WITH DIFFERENTIAL/PLATELET
Basophils Absolute: 0 10*3/uL (ref 0.0–0.2)
Basos: 0 %
EOS (ABSOLUTE): 0.1 10*3/uL (ref 0.0–0.4)
Eos: 1 %
Hematocrit: 35.3 % (ref 34.0–46.6)
Hemoglobin: 11.3 g/dL (ref 11.1–15.9)
Immature Grans (Abs): 0 10*3/uL (ref 0.0–0.1)
Immature Granulocytes: 0 %
Lymphocytes Absolute: 1.4 10*3/uL (ref 0.7–3.1)
Lymphs: 17 %
MCH: 29.5 pg (ref 26.6–33.0)
MCHC: 32 g/dL (ref 31.5–35.7)
MCV: 92 fL (ref 79–97)
Monocytes Absolute: 0.5 10*3/uL (ref 0.1–0.9)
Monocytes: 6 %
Neutrophils Absolute: 6 10*3/uL (ref 1.4–7.0)
Neutrophils: 76 %
Platelets: 300 10*3/uL (ref 150–450)
RBC: 3.83 x10E6/uL (ref 3.77–5.28)
RDW: 13.4 % (ref 11.7–15.4)
WBC: 7.9 10*3/uL (ref 3.4–10.8)

## 2020-01-02 LAB — LIPID PANEL
Chol/HDL Ratio: 4.4 ratio (ref 0.0–4.4)
Cholesterol, Total: 169 mg/dL (ref 100–199)
HDL: 38 mg/dL — ABNORMAL LOW (ref >39)
LDL Chol Calc (NIH): 98 mg/dL (ref 0–99)
Triglycerides: 193 mg/dL — ABNORMAL HIGH (ref 0–149)
VLDL Cholesterol Cal: 33 mg/dL (ref 5–40)

## 2020-01-02 LAB — TSH: TSH: 3.15 u[IU]/mL (ref 0.450–4.500)

## 2020-01-02 LAB — T3: T3, Total: 98 ng/dL (ref 71–180)

## 2020-01-02 LAB — VITAMIN D 25 HYDROXY (VIT D DEFICIENCY, FRACTURES): Vit D, 25-Hydroxy: 30.1 ng/mL (ref 30.0–100.0)

## 2020-01-02 LAB — T4, FREE: Free T4: 1.24 ng/dL (ref 0.82–1.77)

## 2020-02-16 ENCOUNTER — Telehealth: Payer: Self-pay | Admitting: Orthopaedic Surgery

## 2020-02-16 NOTE — Telephone Encounter (Signed)
Patient called requesting a call back concerning a nutritionist. Patient states Dr. Ninfa Linden stated to have a nutritionist call to set an appointment. Patient phone number is [phone].

## 2020-02-16 NOTE — Telephone Encounter (Signed)
I sent an inbasket message to [facility] advising her to contact pt to get scheduled.

## 2020-02-16 NOTE — Telephone Encounter (Signed)
Can we check on this? It looks like it was put in, in September

## 2020-02-24 ENCOUNTER — Other Ambulatory Visit: Payer: Self-pay | Admitting: Family Medicine

## 2020-05-15 ENCOUNTER — Other Ambulatory Visit: Payer: Self-pay | Admitting: Family Medicine

## 2020-05-29 ENCOUNTER — Other Ambulatory Visit: Payer: Self-pay | Admitting: Family Medicine

## 2020-06-30 ENCOUNTER — Ambulatory Visit (INDEPENDENT_AMBULATORY_CARE_PROVIDER_SITE_OTHER): Payer: Medicare Other | Admitting: Family Medicine

## 2020-06-30 ENCOUNTER — Encounter: Payer: Self-pay | Admitting: Family Medicine

## 2020-06-30 ENCOUNTER — Other Ambulatory Visit: Payer: Self-pay

## 2020-06-30 VITALS — BP 122/80 | HR 100 | Wt 340.0 lb

## 2020-06-30 DIAGNOSIS — I10 Essential (primary) hypertension: Secondary | ICD-10-CM

## 2020-06-30 DIAGNOSIS — H9312 Tinnitus, left ear: Secondary | ICD-10-CM

## 2020-06-30 DIAGNOSIS — R0989 Other specified symptoms and signs involving the circulatory and respiratory systems: Secondary | ICD-10-CM

## 2020-06-30 DIAGNOSIS — E785 Hyperlipidemia, unspecified: Secondary | ICD-10-CM

## 2020-06-30 DIAGNOSIS — E559 Vitamin D deficiency, unspecified: Secondary | ICD-10-CM

## 2020-06-30 DIAGNOSIS — Z9889 Other specified postprocedural states: Secondary | ICD-10-CM

## 2020-06-30 DIAGNOSIS — E1121 Type 2 diabetes mellitus with diabetic nephropathy: Secondary | ICD-10-CM

## 2020-06-30 DIAGNOSIS — H9192 Unspecified hearing loss, left ear: Secondary | ICD-10-CM

## 2020-06-30 DIAGNOSIS — E1169 Type 2 diabetes mellitus with other specified complication: Secondary | ICD-10-CM | POA: Diagnosis not present

## 2020-06-30 DIAGNOSIS — E781 Pure hyperglyceridemia: Secondary | ICD-10-CM

## 2020-06-30 DIAGNOSIS — J019 Acute sinusitis, unspecified: Secondary | ICD-10-CM

## 2020-06-30 LAB — POCT GLYCOSYLATED HEMOGLOBIN (HGB A1C): Hemoglobin A1C: 8.3 % — AB (ref 4.0–5.6)

## 2020-06-30 MED ORDER — OZEMPIC (0.25 OR 0.5 MG/DOSE) 2 MG/1.5ML ~~LOC~~ SOPN: PEN_INJECTOR | SUBCUTANEOUS | 2 refills | Status: DC

## 2020-06-30 MED ORDER — AMOXICILLIN 875 MG PO TABS: 875.0000 mg | ORAL_TABLET | Freq: Two times a day (BID) | ORAL | 0 refills | Status: DC

## 2020-06-30 NOTE — Progress Notes (Signed)
Subjective:    Patient ID: [name], female    DOB: Nov 21, 1951, 69 y.o.   MRN: [id number]  [name] is a 69 y.o. female who presents for follow-up of Type 2 diabetes mellitus and other chronic health conditions. She has new complaints as well.   Complains of nasal congestion and post nasal drainage for several weeks. Taking Flonase and Claritin. No improvement. Reports having surgery on her left nostril 10 years ago and has been having issues since but worse more recently.   Complains of left ear decreased hearing and tinnitus.  States her taste has been altered for years. States she can only taste sweet food.  Eating more sweets.   Patient is checking home blood sugars.   Home blood sugar records: BGs range between 140  and 145 How often is blood sugars being checked: twice a month or when she feels like its getting low Current symptoms include: none. Patient denies increased appetite, nausea, visual disturbances, vomiting and weight loss.  Patient is checking their feet daily. Any Foot concerns (callous, ulcer, wound, thickened nails, toenail fungus, skin fungus, hammer toe): none Last dilated eye exam: last year- [facility]  Current treatments: doing ok on meds Medication compliance: good  Current diet: doing ok but has been eating more sweets. Diet not as healthy  Current exercise: none Known diabetic complications: CKD- mild with serum creatinine 1.11   HTN- taking her medication everyday and no issues. Does not check BP at home.   HL- she is intolerant to several statin. Taking Livalo on Mondays only  LDL 98, HDL 38, trig 193 in 12/2019  Severe obesity- she has gained 5 lbs since 12/2019   Vitamin D def- taking a supplement Last vitamin D level 30 in 12/2019  The following portions of the patient's history were reviewed and updated as appropriate: allergies, current medications, past medical history, past social history and problem list.  ROS as in subjective  above.     Objective:    Physical Exam Alert and in no distress. Cardiac exam shows a regular sinus rhythm without murmurs or gallops. Lungs are clear to auscultation. Extremities without edema.    Blood pressure 122/80, pulse 100, weight (!) 340 lb (154.2 kg).  Lab Review Diabetic Labs Latest Ref Rng & Units 06/30/2020 01/01/2020 09/10/2019 06/09/2019 10/28/2018  HbA1c 4.0 - 5.6 % 8.3(A) 6.6(A) 6.8(A) 7.5(A) 7.3(H)  Microalbumin mg/dL - - - - -  Micro/Creat Ratio 0 - 29 mg/g creat - - 15 - 11.2  Chol 100 - 199 mg/dL - 169 - - 172  HDL >39 mg/dL - 38(L) - - 41  Calc LDL 0 - 99 mg/dL - 98 - - 99  Triglycerides 0 - 149 mg/dL - 193(H) - - 161(H)  Creatinine 0.57 - 1.00 mg/dL - 1.11(H) - 1.24(H) 1.23(H)   BP/Weight 06/30/2020 01/01/2020 09/10/2019 09/08/2019 04/05/3789  Systolic BP 240 973 532 992 426  Diastolic BP 80 80 82 78 80  Wt. (Lbs) 340 335.6 336.4 336.2 335  BMI 52.47 51.79 52.69 52.66 54.07   Foot/eye exam completion dates Latest Ref Rng & Units 01/01/2020 02/04/2019  Eye Exam No Retinopathy - No Retinopathy  Foot Form Completion - Done -    [name]  reports that she has never smoked. She has never used smokeless tobacco. She reports that she does not drink alcohol and does not use drugs.     Assessment & Plan:    Type 2 diabetes mellitus with diabetic nephropathy,  unspecified whether long term insulin use ([place]) - Plan: HgB A1c, CBC with Differential/Platelet, Comprehensive metabolic panel  Hyperlipidemia associated with type 2 diabetes mellitus ([place])  Essential hypertension - Plan: CBC with Differential/Platelet, Comprehensive metabolic panel  Severe obesity (BMI >= 40) (HCC)  Hypertriglyceridemia  Vitamin D deficiency  Acute sinusitis with symptoms > 10 days - Plan: amoxicillin (AMOXIL) 875 MG tablet  Chronic sinus complaints - Plan: Ambulatory referral to ENT  History of nasal surgery - Plan: Ambulatory referral to ENT  Tinnitus of left ear - Plan: Ambulatory  referral to ENT  Decreased hearing, left - Plan: Ambulatory referral to ENT  1. Rx changes: add Ozempic once weekly. samples provided. she will let me know if she is not tolerating this or if it is not affordable  Hgb A1c 8.3% and this is significantly higher.  2. Education: Reviewed 'ABCs' of diabetes management (respective goals in parentheses):  A1C (<7), blood pressure (<130/80), and cholesterol (LDL <100). 3. Compliance at present is estimated to be fair. Efforts to improve compliance (if necessary) will be directed at dietary modifications: cut back on sweets and junk food, increased exercise and regular blood sugar monitoring: daily. 4. HTN- controlled. Continue medication.  5. Acute on chronic sinusitis- Amoxil prescribed. Refer to ENT 6. HL- continue Livalo once weekly for now. She has not tolerated several other statins.  7. Follow up: 3 months   Consider referral to endocrinology if diabetes not improving.

## 2020-06-30 NOTE — Patient Instructions (Addendum)
Your hemoglobin A1c is 8.3% and this is significantly higher than your previous visit.  Your diabetes is now uncontrolled.  I strongly encourage you to cut back on sweets and junk food.  Try to be more active. Continue keeping a close eye on your blood sugars.  Continue on the Metformin twice daily and the Amaryl in the morning.  I am adding Ozempic to your diabetes medications.  This is a once weekly injection as discussed.  For the first 4 weeks you will take the lower dose 0.25 mg and increase to the 0.5 mg after that.  You may have some nausea, mild headache for the first week or 2.  If you have any questions, please call us.  I am referring you to an ear nose and throat specialist because of your chronic sinus issues and decreased hearing with ringing in your ear.  Also recommend that you address your loss of taste which is chronic.

## 2020-07-01 LAB — CBC WITH DIFFERENTIAL/PLATELET
Basophils Absolute: 0 10*3/uL (ref 0.0–0.2)
Basos: 0 %
EOS (ABSOLUTE): 0.1 10*3/uL (ref 0.0–0.4)
Eos: 2 %
Hematocrit: 33.8 % — ABNORMAL LOW (ref 34.0–46.6)
Hemoglobin: 10.8 g/dL — ABNORMAL LOW (ref 11.1–15.9)
Immature Grans (Abs): 0 10*3/uL (ref 0.0–0.1)
Immature Granulocytes: 0 %
Lymphocytes Absolute: 1.6 10*3/uL (ref 0.7–3.1)
Lymphs: 21 %
MCH: 29.2 pg (ref 26.6–33.0)
MCHC: 32 g/dL (ref 31.5–35.7)
MCV: 91 fL (ref 79–97)
Monocytes Absolute: 0.4 10*3/uL (ref 0.1–0.9)
Monocytes: 5 %
Neutrophils Absolute: 5.5 10*3/uL (ref 1.4–7.0)
Neutrophils: 72 %
Platelets: 298 10*3/uL (ref 150–450)
RBC: 3.7 x10E6/uL — ABNORMAL LOW (ref 3.77–5.28)
RDW: 13.4 % (ref 11.7–15.4)
WBC: 7.6 10*3/uL (ref 3.4–10.8)

## 2020-07-01 LAB — COMPREHENSIVE METABOLIC PANEL
ALT: 12 IU/L (ref 0–32)
AST: 11 IU/L (ref 0–40)
Albumin/Globulin Ratio: 1.3 (ref 1.2–2.2)
Albumin: 4 g/dL (ref 3.8–4.8)
Alkaline Phosphatase: 84 IU/L (ref 48–121)
BUN/Creatinine Ratio: 17 (ref 12–28)
BUN: 21 mg/dL (ref 8–27)
Bilirubin Total: <0.2 mg/dL (ref 0.0–1.2)
CO2: 25 mmol/L (ref 20–29)
Calcium: 9.6 mg/dL (ref 8.7–10.3)
Chloride: 100 mmol/L (ref 96–106)
Creatinine, Ser: 1.21 mg/dL — ABNORMAL HIGH (ref 0.57–1.00)
GFR calc Af Amer: 54 mL/min/{1.73_m2} — ABNORMAL LOW (ref >59)
GFR calc non Af Amer: 47 mL/min/{1.73_m2} — ABNORMAL LOW (ref >59)
Globulin, Total: 3.1 g/dL (ref 1.5–4.5)
Glucose: 172 mg/dL — ABNORMAL HIGH (ref 65–99)
Potassium: 4.7 mmol/L (ref 3.5–5.2)
Sodium: 138 mmol/L (ref 134–144)
Total Protein: 7.1 g/dL (ref 6.0–8.5)

## 2020-07-23 ENCOUNTER — Telehealth (INDEPENDENT_AMBULATORY_CARE_PROVIDER_SITE_OTHER): Payer: Self-pay

## 2020-07-23 ENCOUNTER — Encounter (INDEPENDENT_AMBULATORY_CARE_PROVIDER_SITE_OTHER): Payer: Self-pay

## 2020-07-23 NOTE — Progress Notes (Unsigned)
I am mailing pt bad letter explaning her bad debt with our office I had told her about and I would be calling her back, that she needs small amount of that paid on to be seen per office manager.. I have not been able to reach pt by phone and she is not returning call messages left for her.

## 2020-08-08 ENCOUNTER — Other Ambulatory Visit: Payer: Self-pay | Admitting: Family Medicine

## 2020-08-09 ENCOUNTER — Telehealth: Payer: Self-pay | Admitting: Family Medicine

## 2020-08-09 NOTE — Telephone Encounter (Signed)
Pt left message needs diabetic strips and also the pen spring has sprung and would like a whole new Solus V2 monitor and also needs refill on her Livalo all to [facility]

## 2020-08-10 ENCOUNTER — Ambulatory Visit (INDEPENDENT_AMBULATORY_CARE_PROVIDER_SITE_OTHER): Payer: Medicare Other | Admitting: Otolaryngology

## 2020-08-10 MED ORDER — SOLUS V2 TWIST LANCETS 30G MISC: 0 refills | Status: DC

## 2020-08-10 MED ORDER — SOLUS V2 BLOOD GLUCOSE SYSTEM DEVI: 0 refills | Status: DC

## 2020-08-10 MED ORDER — LIVALO 4 MG PO TABS: ORAL_TABLET | ORAL | 0 refills | Status: DC

## 2020-08-10 MED ORDER — SOLUS V2 TEST VI STRP: ORAL_STRIP | 1 refills | Status: DC

## 2020-08-10 NOTE — Telephone Encounter (Signed)
Sent in refills 

## 2020-08-24 DIAGNOSIS — R439 Unspecified disturbances of smell and taste: Secondary | ICD-10-CM | POA: Insufficient documentation

## 2020-08-24 DIAGNOSIS — H903 Sensorineural hearing loss, bilateral: Secondary | ICD-10-CM | POA: Diagnosis not present

## 2020-08-24 DIAGNOSIS — H9202 Otalgia, left ear: Secondary | ICD-10-CM | POA: Diagnosis not present

## 2020-09-01 ENCOUNTER — Other Ambulatory Visit: Payer: Self-pay | Admitting: Physician Assistant

## 2020-09-01 DIAGNOSIS — R439 Unspecified disturbances of smell and taste: Secondary | ICD-10-CM

## 2020-09-09 ENCOUNTER — Ambulatory Visit
Admission: RE | Admit: 2020-09-09 | Discharge: 2020-09-09 | Disposition: A | Discharge status: Home / Self Care | Payer: Medicare Other | Source: Ambulatory Visit | Attending: Physician Assistant | Admitting: Physician Assistant

## 2020-09-09 DIAGNOSIS — K110 Atrophy of salivary gland: Secondary | ICD-10-CM | POA: Diagnosis not present

## 2020-09-09 DIAGNOSIS — R439 Unspecified disturbances of smell and taste: Secondary | ICD-10-CM

## 2020-09-13 ENCOUNTER — Other Ambulatory Visit: Payer: Self-pay | Admitting: Family Medicine

## 2020-09-13 DIAGNOSIS — Z1231 Encounter for screening mammogram for malignant neoplasm of breast: Secondary | ICD-10-CM

## 2020-09-20 ENCOUNTER — Other Ambulatory Visit: Payer: Self-pay | Admitting: Family Medicine

## 2020-09-27 IMAGING — MG DIGITAL SCREENING BILATERAL MAMMOGRAM WITH TOMO AND CAD
8 of 14 series · 8 of 40 positions shown · non-contrast
Comparison: Previous exam(s).

CLINICAL DATA: Screening.

EXAM:
DIGITAL SCREENING BILATERAL MAMMOGRAM WITH TOMO AND CAD

[L CV synth-2D]
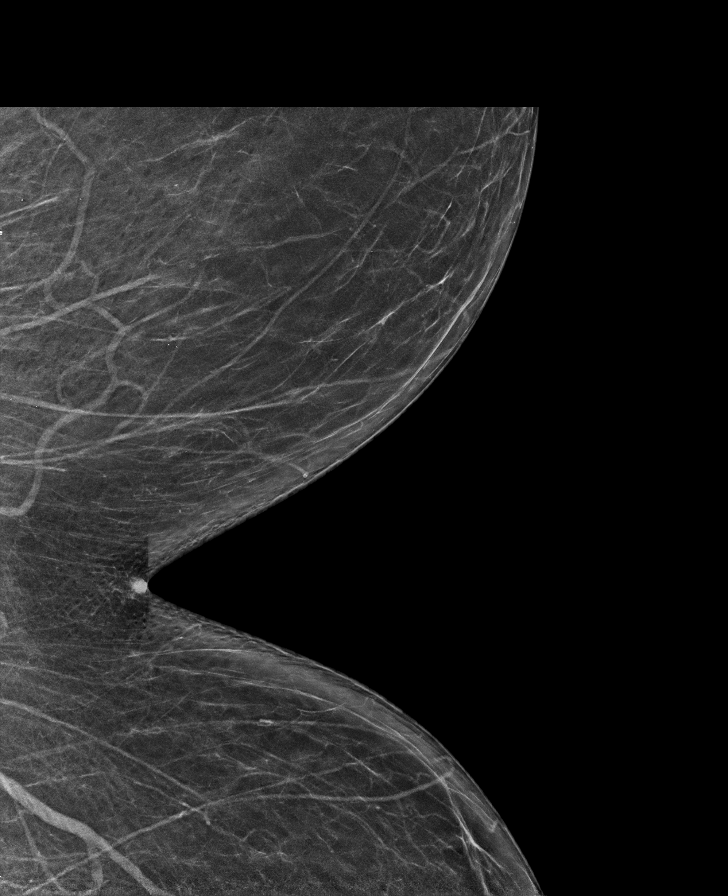

[R MLO synth-2D (1 of 2)]
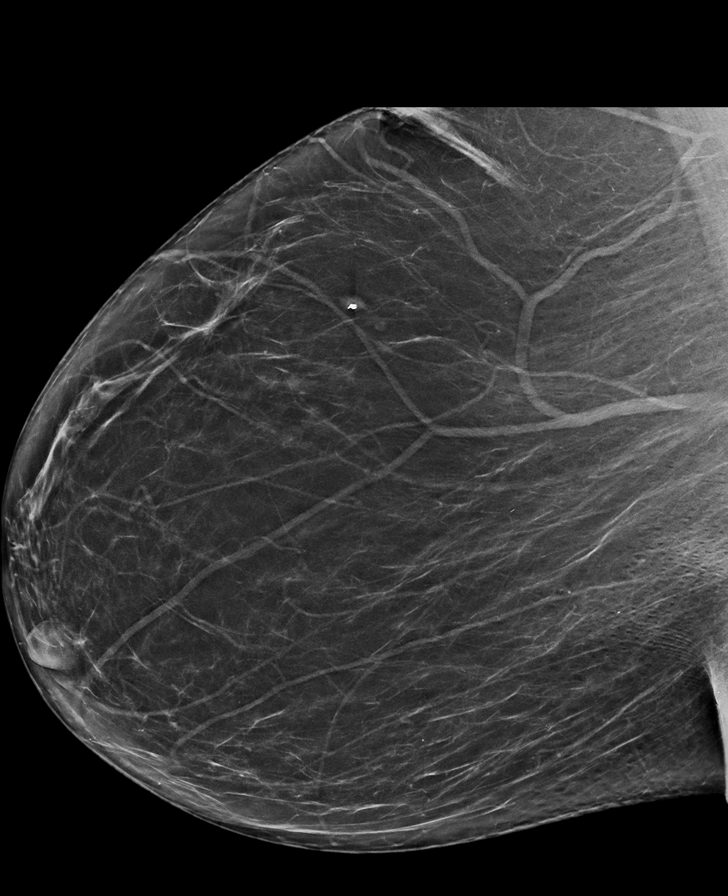

[R CC synth-2D]
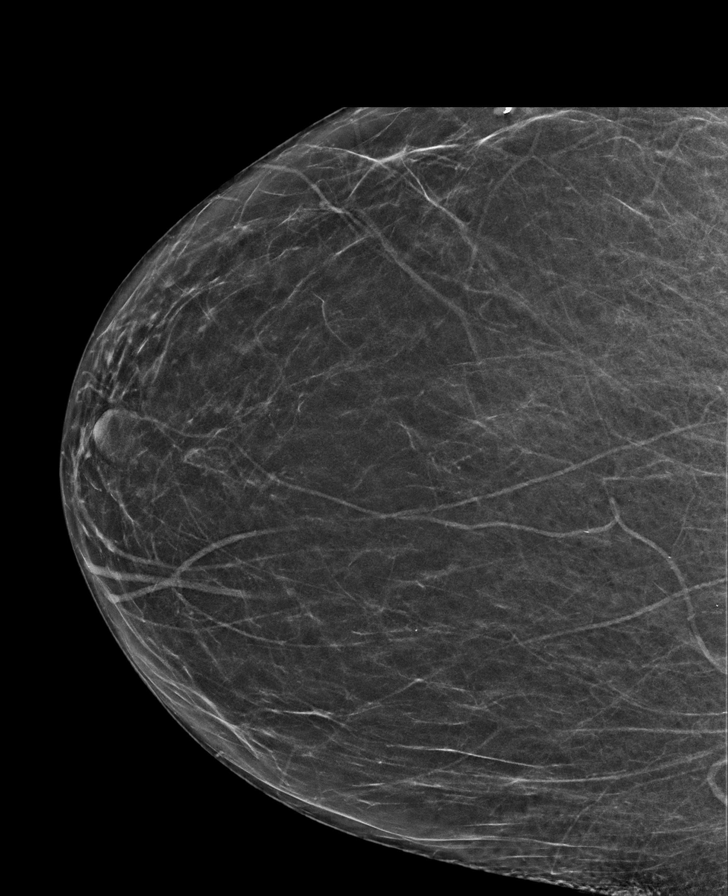

[L MLO synth-2D (1 of 2)]
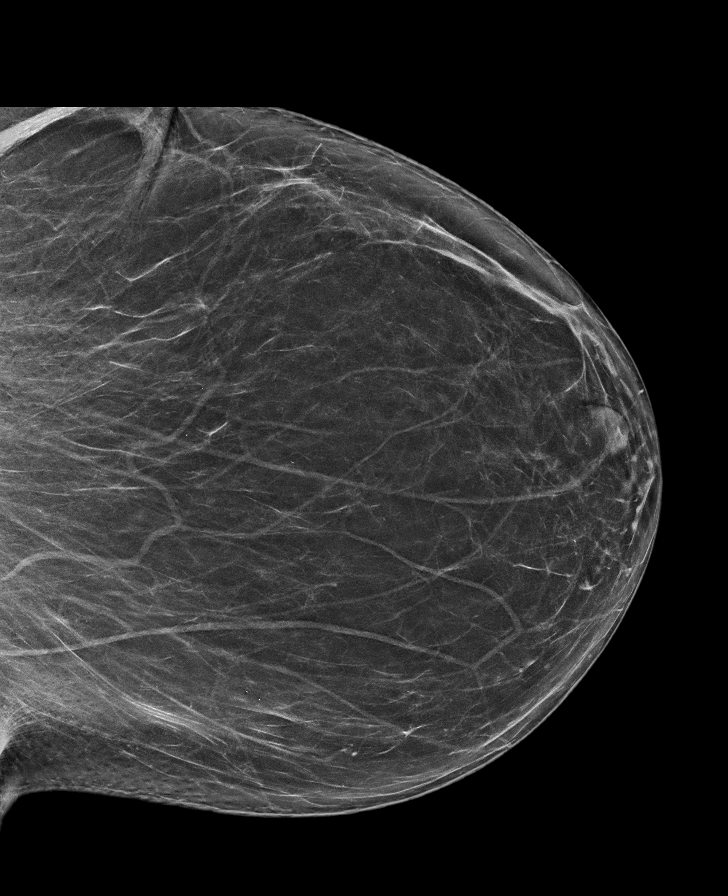

[L MLO synth-2D (2 of 2)]
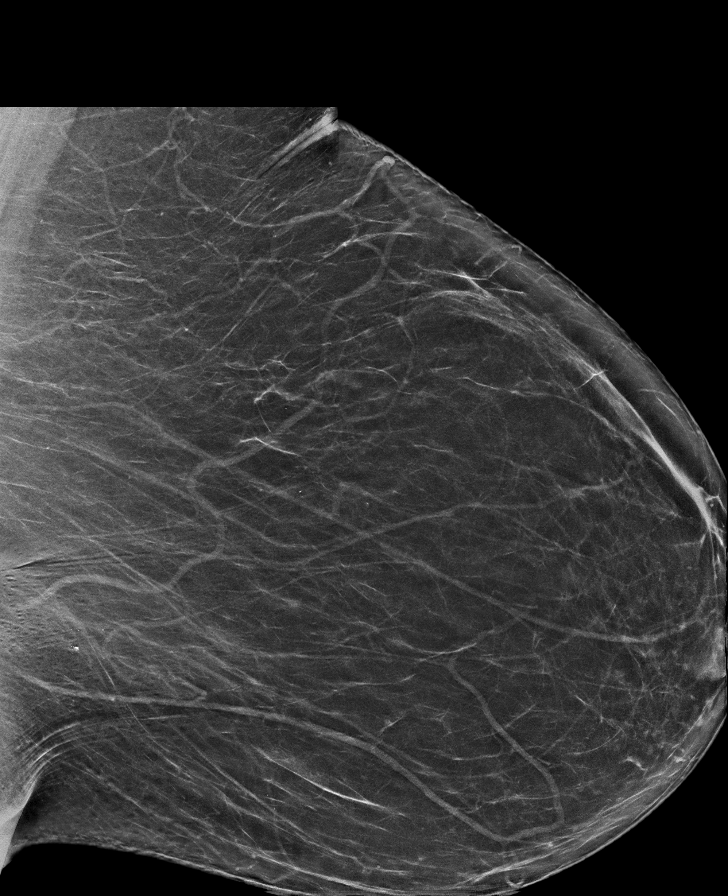

[L CC synth-2D]
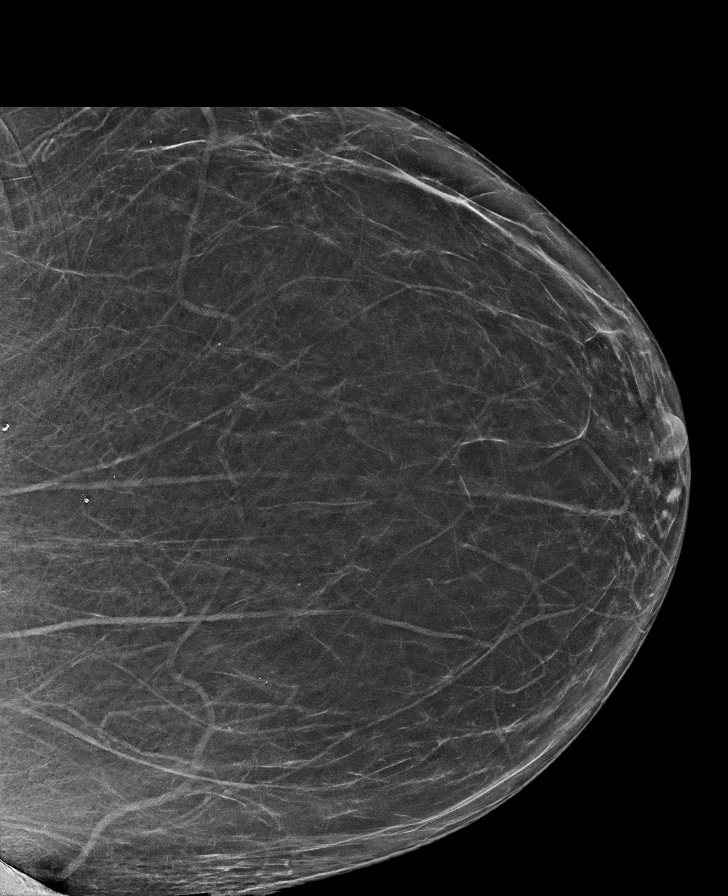

[R MLO synth-2D (2 of 2)]
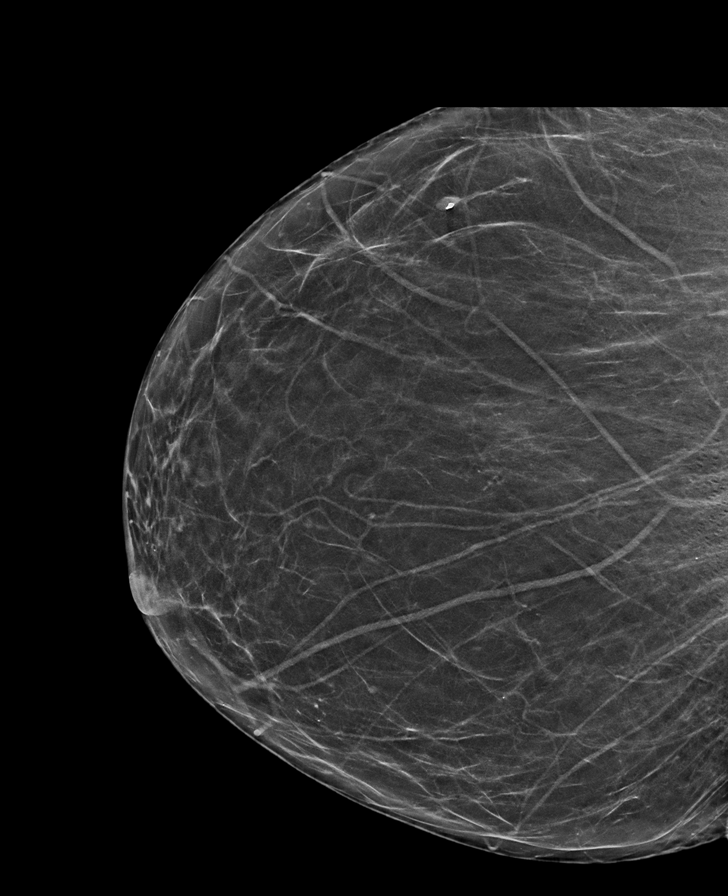

[R CC tomo · tomo slice 31/62.0]
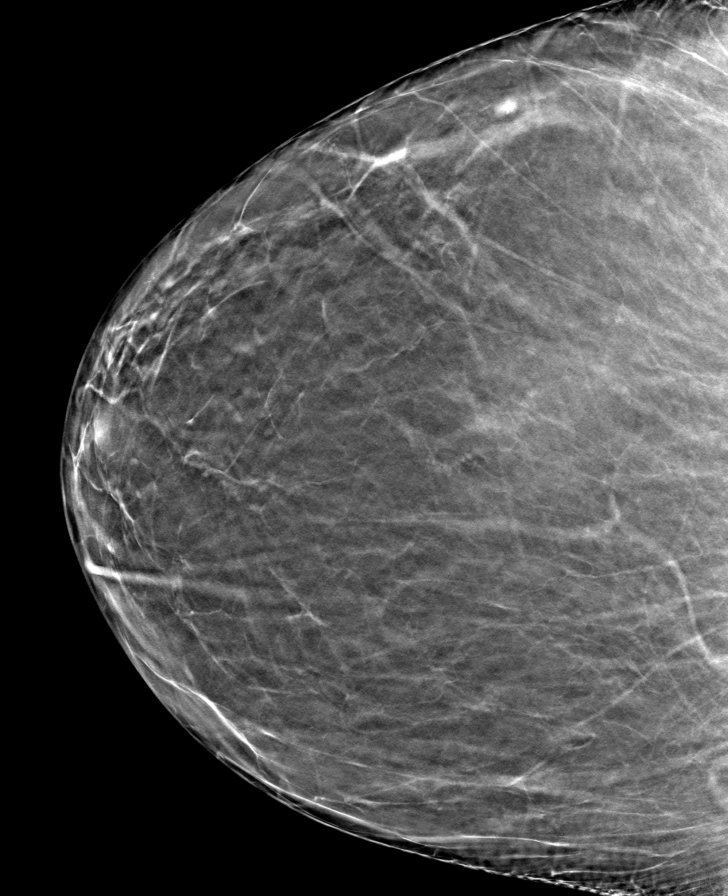

[8 of 40 positions shown; findings below may reference images not displayed]

ACR Breast Density Category b: There are scattered areas of
fibroglandular density.
FINDINGS: There are no findings suspicious for malignancy. Images were
processed with CAD.
IMPRESSION: No mammographic evidence of malignancy. A result letter of this
screening mammogram will be mailed directly to the patient.

RECOMMENDATION:
Screening mammogram in one year. (Code:[id number])

BI-RADS CATEGORY  1: Negative.

## 2020-09-30 ENCOUNTER — Other Ambulatory Visit: Payer: Self-pay

## 2020-09-30 ENCOUNTER — Ambulatory Visit
Admission: RE | Admit: 2020-09-30 | Discharge: 2020-09-30 | Disposition: A | Discharge status: Home / Self Care | Payer: Medicare Other | Source: Ambulatory Visit | Attending: Family Medicine | Admitting: Family Medicine

## 2020-09-30 DIAGNOSIS — Z1231 Encounter for screening mammogram for malignant neoplasm of breast: Secondary | ICD-10-CM

## 2020-10-06 ENCOUNTER — Ambulatory Visit (INDEPENDENT_AMBULATORY_CARE_PROVIDER_SITE_OTHER): Payer: Medicare Other | Admitting: Family Medicine

## 2020-10-06 ENCOUNTER — Encounter: Payer: Self-pay | Admitting: Family Medicine

## 2020-10-06 VITALS — BP 122/82 | HR 98 | Wt 335.2 lb

## 2020-10-06 DIAGNOSIS — E1169 Type 2 diabetes mellitus with other specified complication: Secondary | ICD-10-CM | POA: Diagnosis not present

## 2020-10-06 DIAGNOSIS — N189 Chronic kidney disease, unspecified: Secondary | ICD-10-CM

## 2020-10-06 DIAGNOSIS — I10 Essential (primary) hypertension: Secondary | ICD-10-CM | POA: Diagnosis not present

## 2020-10-06 DIAGNOSIS — H8113 Benign paroxysmal vertigo, bilateral: Secondary | ICD-10-CM

## 2020-10-06 DIAGNOSIS — Z23 Encounter for immunization: Secondary | ICD-10-CM | POA: Diagnosis not present

## 2020-10-06 DIAGNOSIS — E1121 Type 2 diabetes mellitus with diabetic nephropathy: Secondary | ICD-10-CM | POA: Diagnosis not present

## 2020-10-06 DIAGNOSIS — E785 Hyperlipidemia, unspecified: Secondary | ICD-10-CM

## 2020-10-06 LAB — POCT GLYCOSYLATED HEMOGLOBIN (HGB A1C): Hemoglobin A1C: 7 % — AB (ref 4.0–5.6)

## 2020-10-06 NOTE — Progress Notes (Signed)
Subjective:    Patient ID: [name], female    DOB: Dec 10, 1951, 69 y.o.   MRN: [id number]  [name] is a 69 y.o. female who presents for follow-up of Type 2 diabetes mellitus.  Takes Livalo once weekly. States she cannot tolerate it more often.   Reports intermittent dizziness which is her usual vertigo.   States she has intermittent bilateral lower abdominal pain only with certain movements and it resolves spontaneously with rest.   Patient is checking home blood sugars.   Home blood sugar records: BGs range between 101 and 170 How often is blood sugars being checked: everyday Current symptoms include: none. Patient denies increased appetite, nausea, vomiting and weight loss.  Patient is checking their feet daily. Any Foot concerns (callous, ulcer, wound, thickened nails, toenail fungus, skin fungus, hammer toe): none Last dilated eye exam:  Will reschedule eye appt  Current treatments: doing well on medicine. We added once weekly Ozempic and she is doing fine but does have some nausea with it.  Has lost 5 lbs.  Medication compliance: good  Current diet: in general, a "healthy" diet   Current exercise: none Known diabetic complications: CKD  The following portions of the patient's history were reviewed and updated as appropriate: allergies, current medications, past medical history, past social history and problem list.  ROS as in subjective above.     Objective:    Physical Exam Alert and in no distress. Cardiac exam shows a regular sinus rhythm without murmurs or gallops. Lungs are clear to auscultation. extremities without edema. Abdomen is obese, non tender.    Blood pressure 122/82, pulse 98, weight (!) 335 lb 3.2 oz (152 kg).  Lab Review Diabetic Labs Latest Ref Rng & Units 10/06/2020 06/30/2020 01/01/2020 09/10/2019 06/09/2019  HbA1c 4.0 - 5.6 % 7.0(A) 8.3(A) 6.6(A) 6.8(A) 7.5(A)  Microalbumin mg/dL - - - - -  Micro/Creat Ratio 0 - 29 mg/g creat - - - 15 -   Chol 100 - 199 mg/dL - - 169 - -  HDL >39 mg/dL - - 38(L) - -  Calc LDL 0 - 99 mg/dL - - 98 - -  Triglycerides 0 - 149 mg/dL - - 193(H) - -  Creatinine 0.57 - 1.00 mg/dL - 1.21(H) 1.11(H) - 1.24(H)   BP/Weight 10/06/2020 06/30/2020 01/01/2020 09/10/2019 03/08/253  Systolic BP 270 623 762 831 517  Diastolic BP 82 80 80 82 78  Wt. (Lbs) 335.2 340 335.6 336.4 336.2  BMI 51.72 52.47 51.79 52.69 52.66   Foot/eye exam completion dates Latest Ref Rng & Units 01/01/2020 02/04/2019  Eye Exam No Retinopathy - No Retinopathy  Foot Form Completion - Done -    Myriam  reports that she has never smoked. She has never used smokeless tobacco. She reports that she does not drink alcohol and does not use drugs.     Assessment & Plan:    Type 2 diabetes mellitus with diabetic nephropathy, unspecified whether long term insulin use ([place]) - Plan: HgB A1c, Comprehensive metabolic panel, CBC with Differential/Platelet, TSH, T4, free, Microalbumin / creatinine urine ratio  Hyperlipidemia associated with type 2 diabetes mellitus ([place]) - Plan: Lipid panel  Benign paroxysmal positional vertigo due to bilateral vestibular disorder  Essential hypertension  Chronic kidney disease, unspecified CKD stage  Severe obesity (BMI >= 40) (HCC) - Plan: TSH, T4, free  Needs flu shot - Plan: Flu Vaccine QUAD High Dose(Fluad)  1. Rx changes: none Hgb A1c 7.0% (down from 8.2). Ozempic added  at her last visit.  2. Education: Reviewed 'ABCs' of diabetes management (respective goals in parentheses):  A1C (<7), blood pressure (<130/80), and cholesterol (LDL <100). 3. Compliance at present is estimated to be good. Efforts to improve compliance (if necessary) will be directed at dietary modifications: cut back on sweets and carbohydrates, increased exercise and regular blood sugar monitoring: daily. 4. HTN- controlled.  5. HL- taking Livalo once weekly  6. Vertigo- use Meclizine as needed.  7. CKD- continue to monitor 8. Morbid  obesity- did not go to [facility] in the past due to not having $100 up front per patient. encouraged her to call them and see if they still require this or if she can be on a payment plan.  9. Denies chronic bronchitis.  10. Follow up: 4 months for AWV and diabetes

## 2020-10-06 NOTE — Patient Instructions (Signed)
Your blood sugars have improved.  Your hemoglobin A1c today is 7.0% and your diabetes is now much better controlled.  Continue on your current medications.  Call [facility] and see if there payment is still due before your first visit and ask if a payment plan is possible.  Call and schedule your diabetic eye exam

## 2020-10-07 LAB — MICROALBUMIN / CREATININE URINE RATIO
Creatinine, Urine: 140.1 mg/dL
Microalb/Creat Ratio: 14 mg/g creat (ref 0–29)
Microalbumin, Urine: 19.5 ug/mL

## 2020-10-07 LAB — CBC WITH DIFFERENTIAL/PLATELET
Basophils Absolute: 0 10*3/uL (ref 0.0–0.2)
Basos: 1 %
EOS (ABSOLUTE): 0.1 10*3/uL (ref 0.0–0.4)
Eos: 1 %
Hematocrit: 35.8 % (ref 34.0–46.6)
Hemoglobin: 11.5 g/dL (ref 11.1–15.9)
Immature Grans (Abs): 0 10*3/uL (ref 0.0–0.1)
Immature Granulocytes: 0 %
Lymphocytes Absolute: 1.9 10*3/uL (ref 0.7–3.1)
Lymphs: 22 %
MCH: 28.9 pg (ref 26.6–33.0)
MCHC: 32.1 g/dL (ref 31.5–35.7)
MCV: 90 fL (ref 79–97)
Monocytes Absolute: 0.6 10*3/uL (ref 0.1–0.9)
Monocytes: 7 %
Neutrophils Absolute: 6 10*3/uL (ref 1.4–7.0)
Neutrophils: 69 %
Platelets: 339 10*3/uL (ref 150–450)
RBC: 3.98 x10E6/uL (ref 3.77–5.28)
RDW: 13.5 % (ref 11.7–15.4)
WBC: 8.7 10*3/uL (ref 3.4–10.8)

## 2020-10-07 LAB — LIPID PANEL
Chol/HDL Ratio: 4.6 ratio — ABNORMAL HIGH (ref 0.0–4.4)
Cholesterol, Total: 158 mg/dL (ref 100–199)
HDL: 34 mg/dL — ABNORMAL LOW (ref >39)
LDL Chol Calc (NIH): 83 mg/dL (ref 0–99)
Triglycerides: 243 mg/dL — ABNORMAL HIGH (ref 0–149)
VLDL Cholesterol Cal: 41 mg/dL — ABNORMAL HIGH (ref 5–40)

## 2020-10-07 LAB — COMPREHENSIVE METABOLIC PANEL
ALT: 12 IU/L (ref 0–32)
AST: 8 IU/L (ref 0–40)
Albumin/Globulin Ratio: 1.3 (ref 1.2–2.2)
Albumin: 4.2 g/dL (ref 3.8–4.8)
Alkaline Phosphatase: 84 IU/L (ref 44–121)
BUN/Creatinine Ratio: 15 (ref 12–28)
BUN: 19 mg/dL (ref 8–27)
Bilirubin Total: <0.2 mg/dL (ref 0.0–1.2)
CO2: 26 mmol/L (ref 20–29)
Calcium: 9.8 mg/dL (ref 8.7–10.3)
Chloride: 101 mmol/L (ref 96–106)
Creatinine, Ser: 1.26 mg/dL — ABNORMAL HIGH (ref 0.57–1.00)
GFR calc Af Amer: 51 mL/min/{1.73_m2} — ABNORMAL LOW (ref >59)
GFR calc non Af Amer: 44 mL/min/{1.73_m2} — ABNORMAL LOW (ref >59)
Globulin, Total: 3.3 g/dL (ref 1.5–4.5)
Glucose: 115 mg/dL — ABNORMAL HIGH (ref 65–99)
Potassium: 5.2 mmol/L (ref 3.5–5.2)
Sodium: 140 mmol/L (ref 134–144)
Total Protein: 7.5 g/dL (ref 6.0–8.5)

## 2020-10-07 LAB — TSH: TSH: 4.27 u[IU]/mL (ref 0.450–4.500)

## 2020-10-07 LAB — T4, FREE: Free T4: 1.35 ng/dL (ref 0.82–1.77)

## 2020-10-07 MED ORDER — OZEMPIC (1 MG/DOSE) 2 MG/1.5ML ~~LOC~~ SOPN: 1.0000 mg | PEN_INJECTOR | SUBCUTANEOUS | 2 refills | Status: DC

## 2020-10-07 NOTE — Addendum Note (Signed)
Addended by: Minette Headland A on: 10/07/2020 08:09 AM   Modules accepted: Orders

## 2020-10-11 ENCOUNTER — Other Ambulatory Visit: Payer: Self-pay | Admitting: Family Medicine

## 2020-10-19 ENCOUNTER — Other Ambulatory Visit: Payer: Self-pay | Admitting: Family Medicine

## 2020-12-14 DIAGNOSIS — H5203 Hypermetropia, bilateral: Secondary | ICD-10-CM | POA: Diagnosis not present

## 2020-12-14 DIAGNOSIS — E119 Type 2 diabetes mellitus without complications: Secondary | ICD-10-CM | POA: Diagnosis not present

## 2020-12-14 LAB — HM DIABETES EYE EXAM

## 2020-12-28 ENCOUNTER — Telehealth: Payer: Self-pay

## 2020-12-28 MED ORDER — OZEMPIC (1 MG/DOSE) 2 MG/1.5ML ~~LOC~~ SOPN: 1.0000 mg | PEN_INJECTOR | SUBCUTANEOUS | 2 refills | Status: DC

## 2020-12-28 NOTE — Telephone Encounter (Signed)
Ozempic helped lower her A1c so as long as she is not having any side effects or concerns then I do want her to continue at least until her follow up diabetes visit. Thanks. Ok to refill.

## 2020-12-28 NOTE — Telephone Encounter (Signed)
Med refilled.

## 2020-12-28 NOTE — Telephone Encounter (Signed)
Pt called & left message stating she needs a refill on her Ozempic she just wanted to make sure that you want her to continue on this medication ?

## 2021-01-05 ENCOUNTER — Other Ambulatory Visit: Payer: Self-pay | Admitting: Family Medicine

## 2021-01-11 ENCOUNTER — Encounter: Payer: Self-pay | Admitting: Internal Medicine

## 2021-02-07 ENCOUNTER — Ambulatory Visit: Payer: Medicare Other | Admitting: Family Medicine

## 2021-02-16 ENCOUNTER — Encounter: Payer: Self-pay | Admitting: Family Medicine

## 2021-02-16 ENCOUNTER — Other Ambulatory Visit: Payer: Self-pay

## 2021-02-16 ENCOUNTER — Ambulatory Visit (INDEPENDENT_AMBULATORY_CARE_PROVIDER_SITE_OTHER): Payer: Medicare Other | Admitting: Family Medicine

## 2021-02-16 VITALS — BP 122/80 | HR 102 | Temp 98.8°F | Ht 66.5 in | Wt 332.8 lb

## 2021-02-16 DIAGNOSIS — E1169 Type 2 diabetes mellitus with other specified complication: Secondary | ICD-10-CM

## 2021-02-16 DIAGNOSIS — Z0000 Encounter for general adult medical examination without abnormal findings: Secondary | ICD-10-CM

## 2021-02-16 DIAGNOSIS — E1121 Type 2 diabetes mellitus with diabetic nephropathy: Secondary | ICD-10-CM

## 2021-02-16 DIAGNOSIS — E1159 Type 2 diabetes mellitus with other circulatory complications: Secondary | ICD-10-CM

## 2021-02-16 DIAGNOSIS — E038 Other specified hypothyroidism: Secondary | ICD-10-CM

## 2021-02-16 DIAGNOSIS — I152 Hypertension secondary to endocrine disorders: Secondary | ICD-10-CM

## 2021-02-16 DIAGNOSIS — Z1389 Encounter for screening for other disorder: Secondary | ICD-10-CM | POA: Diagnosis not present

## 2021-02-16 DIAGNOSIS — Z66 Do not resuscitate: Secondary | ICD-10-CM | POA: Diagnosis not present

## 2021-02-16 DIAGNOSIS — E785 Hyperlipidemia, unspecified: Secondary | ICD-10-CM

## 2021-02-16 DIAGNOSIS — N393 Stress incontinence (female) (male): Secondary | ICD-10-CM | POA: Insufficient documentation

## 2021-02-16 LAB — POCT URINALYSIS DIP (PROADVANTAGE DEVICE)
Bilirubin, UA: NEGATIVE
Blood, UA: NEGATIVE
Glucose, UA: NEGATIVE mg/dL
Ketones, POC UA: NEGATIVE mg/dL
Leukocytes, UA: NEGATIVE
Nitrite, UA: NEGATIVE
Specific Gravity, Urine: 1.03
Urobilinogen, Ur: 0.2
pH, UA: 6 (ref 5.0–8.0)

## 2021-02-16 MED ORDER — OZEMPIC (1 MG/DOSE) 2 MG/1.5ML ~~LOC~~ SOPN: 1.0000 mg | PEN_INJECTOR | SUBCUTANEOUS | 2 refills | Status: DC

## 2021-02-16 NOTE — Patient Instructions (Addendum)
  [name] , Thank you for taking time to come for your Medicare Wellness Visit. I appreciate your ongoing commitment to your health goals. Please review the following plan we discussed and let me know if I can assist you in the future.   These are the goals we discussed:  Call and check on your Tdap at [facility].   Try to increase your physical activity.   I am referring you to physical therapy for pelvic floor weakness.   We will be in touch with your lab results.       This is a list of the screening recommended for you and due dates:  Health Maintenance  Topic Date Due  . Tetanus Vaccine  12/20/2013  . Hemoglobin A1C  04/06/2021  . Eye exam for diabetics  12/14/2021  . Complete foot exam   02/16/2022  . Mammogram  09/30/2022  . Colon Cancer Screening  03/06/2024  . Flu Shot  Completed  . DEXA scan (bone density measurement)  Completed  . COVID-19 Vaccine  Completed  .  Hepatitis C: One time screening is recommended by Center for Disease Control  (CDC) for  adults born from [birth year] through 1963.   Completed  . Pneumonia vaccines  Completed  . HPV Vaccine  Aged Out

## 2021-02-16 NOTE — Progress Notes (Signed)
[name] is a 70 y.o. female who presents for annual wellness visit, CPE and follow-up on chronic medical conditions.  She has the following concerns:  Diabetes with nephropathy- last Hgb A1c 7.0% on 10/06/2020  Checks BS at home. FBS in the 140 range mostly   States she is using Ozempic weekly.  Decreased appetite. Eating a meal per day and drinks Boost. States she is losing weight.   States her energy has improved. She has been taking a MVI.   Livalo once weekly. States she cannot take it more than once weekly due to muscle aches. Has been intolerant to other statins.   HTN- reports taking Hyzaar daily without any concerns.  She does not check BP at home.   Complains of stress urinary incontinence for the past year, worsening.    States 2 week ago she had a painful right foot. Her foot was swollen. She thinks it may have been a gout attack. No gout diagnosis in the past but thinks she may have had some attacks. Asymptomatic today.   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     Immunization History  Administered Date(s) Administered  . Fluad Quad(high Dose 65+) 09/10/2019, 10/06/2020  . Influenza, High Dose Seasonal PF 10/28/2018  . Influenza,inj,Quad PF,6+ Mos 09/21/2016, 10/03/2017  . Moderna Sars-Covid-2 Vaccination 05/25/2020, 06/22/2020, 12/29/2020  . PPD Test 02/09/2017  . Pneumococcal Conjugate-13 10/28/2018  . Pneumococcal Polysaccharide-23 08/01/2006, 01/01/2020  . Td 12/21/2003   States she got her Tdap at [facility] on [address].    Last Pap smear: aged out Last mammogram: 09/30/20 Last colonoscopy: 03/06/14 Last DEXA: 12/27/2018 and normal  Dentist:Q year Ophtho: Q year. Last one with Dr.  Einar Gip in 2021 and no retinopathy per patient  Exercise: N/A due to back pian  Other doctors caring for patient include: Dr. Caprice Beaver ENT, Dr. Ninfa Linden ortho, Dr. Watt Climes GI, Pulmonologist- Dr. Margaretha Seeds    Depression screen:  See questionnaire below.  Depression screen [facility] 2/9 02/16/2021 06/30/2020 01/01/2020 10/28/2018 10/03/2017  Decreased Interest 0 0 0 0 0  Down, Depressed, Hopeless 0 0 0 0 0  PHQ - 2 Score 0 0 0 0 0    Fall Risk Screen: see questionnaire below. Fall Risk  02/16/2021 06/30/2020 01/01/2020 10/28/2018 10/03/2017  Falls in the past year? 0 0 0 0 No  Number falls in past yr: 0 0 0 - -  Injury with Fall? 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 Her son, [name], is her  HCPOA. States she has a living will at home. MOST form reviewed and she now wants to be a DNR.  DNR gold sheet filled out, original sent with patient   Review of Systems Constitutional: -fever, -chills, -sweats, -unexpected weight change, -anorexia, -fatigue Allergy: -sneezing, -itching, -congestion Dermatology: denies changing moles, rash, lumps, new worrisome lesions ENT: -runny nose, -ear pain, -sore throat, -hoarseness, -sinus pain, -teeth pain, -tinnitus, -hearing loss, -epistaxis Cardiology:  -chest pain, -palpitations,  -edema, -orthopnea, -paroxysmal nocturnal dyspnea Respiratory: -cough, -shortness of breath, -dyspnea on exertion, -wheezing, -hemoptysis Gastroenterology: -abdominal pain, -nausea, -vomiting, -diarrhea, -constipation, -blood in stool, -changes in bowel movement, -dysphagia Hematology: -bleeding or bruising problems Musculoskeletal: -arthralgias, -myalgias, -joint swelling, -back pain, -neck pain, -cramping, -gait changes Ophthalmology: -vision changes, -eye redness, -itching, -discharge Urology: -dysuria, -difficulty urinating, -hematuria, -urinary frequency, -urgency, + stress incontinence Neurology: -headache, -weakness, -tingling, -numbness, -speech abnormality, -memory loss, -falls, -dizziness Psychology:  -depressed mood, -agitation, -sleep problems    PHYSICAL EXAM:  BP 122/80   Pulse (!) 102   Temp 98.8 F (37.1 C)   Ht 5' 6.5" (1.689 m)   Wt (!) 332 lb 12.8 oz (151 kg)   LMP  (LMP Unknown)   SpO2 97%   BMI 52.91 kg/m   General Appearance: Alert, cooperative, no distress, obese, appears stated age Head: Normocephalic, without obvious abnormality, atraumatic Eyes: PERRL, conjunctiva/corneas clear, EOM's intact Ears: Normal TM's and external ear canals Nose:  Mask on  Throat: mask on  Neck: Supple, no lymphadenopathy; thyroid: no enlargement/tenderness/nodules; no JVD Back: Spine nontender, no curvature, ROM normal, no CVA tenderness Lungs: Clear to auscultation bilaterally without wheezes, rales or ronchi; respirations unlabored Chest Wall: No tenderness or deformity Heart: Regular rate and rhythm, S1 and S2 normal, no murmur, rub or gallop Breast Exam: declines  Abdomen: Soft, non-tender, nondistended, normoactive bowel sounds. Could not fully assess due to patient unable to lie supine  Genitalia: declines  Extremities: No clubbing, cyanosis or edema. Normal sensation, cap refill, skin of feet  Pulses: 2+ and symmetric all extremities Skin: Skin color, texture, turgor  normal, no rashes or lesions Lymph nodes: Cervical, supraclavicular nodes normal  Neurologic: CNII-XII intact, normal strength, sensation and gait  Psych: Normal mood, affect, hygiene and grooming.  ASSESSMENT/PLAN: Medicare annual wellness visit, subsequent -Here today for AWV.  Denies depression, falls, difficulties with ADLs or memory concerns.  Reviewed medications.  Advanced directive counseling done and she does wish to continue to be a DNR.  She has this form at home.  Routine general medical examination at a health care facility - Plan: POCT Urinalysis DIP (Proadvantage Device), CBC with Differential/Platelet, Comprehensive metabolic panel, TSH, T4, free -Preventive health care reviewed.  Counseling on healthy lifestyle including diet and exercise.  Recommend regular dental and eye exams.  Recommend being more physically active as she is quite sedentary.  Discussed eating small frequent meals.  Immunizations reviewed.  She will let me know if she did not get the Tdap in the past 10 years and will need to update this at her pharmacy.  Discussed safety and health promotion.  Type 2 diabetes mellitus with diabetic nephropathy, unspecified whether long term insulin use ([place]) - Plan: POCT Urinalysis DIP (Proadvantage Device), Hemoglobin A1c -Last hemoglobin A1c 7.0%.  Reports good compliance with her diabetes medications.  She will continue to keep an eye on her blood sugar at home.  Recommend low sugar and low carb diet and increasing physical activity.  Follow-up pending A1c.  Hyperlipidemia associated with  type 2 diabetes mellitus ([place]) - Plan: Lipid panel -She has been intolerant of statins.  She is able to take Livalo once weekly only.  She does report intermittent muscle cramps with this medication.  Recommend low-fat and low-cholesterol diet and increasing physical activity.  Subclinical hypothyroidism -Check thyroid function.  Hypertension associated with diabetes ([place]) - Plan: POCT  Urinalysis DIP (Proadvantage Device) -Blood pressure controlled.  Continue current medication.  Continue low-sodium diet.  Severe obesity (BMI >= 40) (HCC) -Encouraged weight loss to help improve overall health.  Recommend increasing her activity level and that being sedentary does not help with weight loss.  Encourage small frequent meals and low sugar, low-carb and low-fat diet.  Stress incontinence in female -She reports this is becoming quite bothersome.  Declines pelvic exam.  I will refer her to PT for pelvic floor dysfunction.  Screening for gout - Plan: Uric acid -Unclear as to whether she has gout but she does report gout-like symptoms in the past.  Currently asymptomatic.  DNR (do not resuscitate) -DNR discussion once again.  She does have a DNR form at home which we filled out last year.  She does still want to be a DNR.     Discussed monthly self breast exams and yearly mammograms; at least 30 minutes of aerobic activity at least 5 days/week and weight-bearing exercise 2x/week; proper sunscreen use reviewed; healthy diet, including goals of calcium and vitamin D intake and alcohol recommendations (less than or equal to 1 drink/day) reviewed; regular seatbelt use; changing batteries in smoke detectors.  Immunization recommendations discussed.  Colonoscopy recommendations reviewed   Medicare Attestation I have personally reviewed: The patient's medical and social history Their use of alcohol, tobacco or illicit drugs Their current medications and supplements The patient's functional ability including ADLs,fall risks, home safety risks, cognitive, and hearing and visual impairment Diet and physical activities Evidence for depression or mood disorders  The patient's weight, height, and BMI have been recorded in the chart.  I have made referrals, counseling, and provided education to the patient based on review of the above and I have provided the patient with a written  personalized care plan for preventive services.     Harland Dingwall, NP-C   02/16/2021

## 2021-02-17 LAB — CBC WITH DIFFERENTIAL/PLATELET
Basophils Absolute: 0 10*3/uL (ref 0.0–0.2)
Basos: 1 %
EOS (ABSOLUTE): 0.1 10*3/uL (ref 0.0–0.4)
Eos: 1 %
Hematocrit: 36.4 % (ref 34.0–46.6)
Hemoglobin: 11.3 g/dL (ref 11.1–15.9)
Immature Grans (Abs): 0 10*3/uL (ref 0.0–0.1)
Immature Granulocytes: 0 %
Lymphocytes Absolute: 1.8 10*3/uL (ref 0.7–3.1)
Lymphs: 20 %
MCH: 28.7 pg (ref 26.6–33.0)
MCHC: 31 g/dL — ABNORMAL LOW (ref 31.5–35.7)
MCV: 92 fL (ref 79–97)
Monocytes Absolute: 0.5 10*3/uL (ref 0.1–0.9)
Monocytes: 6 %
Neutrophils Absolute: 6.4 10*3/uL (ref 1.4–7.0)
Neutrophils: 72 %
Platelets: 328 10*3/uL (ref 150–450)
RBC: 3.94 x10E6/uL (ref 3.77–5.28)
RDW: 13.5 % (ref 11.7–15.4)
WBC: 8.8 10*3/uL (ref 3.4–10.8)

## 2021-02-17 LAB — HEMOGLOBIN A1C
Est. average glucose Bld gHb Est-mCnc: 151 mg/dL
Hgb A1c MFr Bld: 6.9 % — ABNORMAL HIGH (ref 4.8–5.6)

## 2021-02-17 LAB — COMPREHENSIVE METABOLIC PANEL
ALT: 10 IU/L (ref 0–32)
AST: 14 IU/L (ref 0–40)
Albumin/Globulin Ratio: 1.4 (ref 1.2–2.2)
Albumin: 4.3 g/dL (ref 3.8–4.8)
Alkaline Phosphatase: 74 IU/L (ref 44–121)
BUN/Creatinine Ratio: 17 (ref 12–28)
BUN: 24 mg/dL (ref 8–27)
Bilirubin Total: 0.2 mg/dL (ref 0.0–1.2)
CO2: 23 mmol/L (ref 20–29)
Calcium: 9.7 mg/dL (ref 8.7–10.3)
Chloride: 102 mmol/L (ref 96–106)
Creatinine, Ser: 1.42 mg/dL — ABNORMAL HIGH (ref 0.57–1.00)
Globulin, Total: 3 g/dL (ref 1.5–4.5)
Glucose: 73 mg/dL (ref 65–99)
Potassium: 4.7 mmol/L (ref 3.5–5.2)
Sodium: 141 mmol/L (ref 134–144)
Total Protein: 7.3 g/dL (ref 6.0–8.5)
eGFR: 41 mL/min/{1.73_m2} — ABNORMAL LOW (ref >59)

## 2021-02-17 LAB — LIPID PANEL
Chol/HDL Ratio: 3.9 ratio (ref 0.0–4.4)
Cholesterol, Total: 152 mg/dL (ref 100–199)
HDL: 39 mg/dL — ABNORMAL LOW (ref >39)
LDL Chol Calc (NIH): 88 mg/dL (ref 0–99)
Triglycerides: 141 mg/dL (ref 0–149)
VLDL Cholesterol Cal: 25 mg/dL (ref 5–40)

## 2021-02-17 LAB — TSH: TSH: 3.09 u[IU]/mL (ref 0.450–4.500)

## 2021-02-17 LAB — T4, FREE: Free T4: 1.49 ng/dL (ref 0.82–1.77)

## 2021-02-17 LAB — URIC ACID: Uric Acid: 8.9 mg/dL — ABNORMAL HIGH (ref 3.0–7.2)

## 2021-02-18 ENCOUNTER — Other Ambulatory Visit: Payer: Self-pay | Admitting: Family Medicine

## 2021-02-18 DIAGNOSIS — E790 Hyperuricemia without signs of inflammatory arthritis and tophaceous disease: Secondary | ICD-10-CM

## 2021-02-18 MED ORDER — ALLOPURINOL 100 MG PO TABS: 100.0000 mg | ORAL_TABLET | Freq: Every day | ORAL | 2 refills | Status: DC

## 2021-02-18 NOTE — Progress Notes (Signed)
Please let her know that her uric acid is elevated meaning that the pain she had recently in her foot may have been due to gout. I will send in a medication for her to take called allopurinol to help lower her uric acid. Her Hgb A1c is stable at 6.9%. her kidney function is still elevated and we will continue to monitor it. Continue on all other medications.

## 2021-03-08 ENCOUNTER — Telehealth: Payer: Self-pay | Admitting: Family Medicine

## 2021-03-08 MED ORDER — LIVALO 4 MG PO TABS: ORAL_TABLET | ORAL | 0 refills | Status: DC

## 2021-03-08 NOTE — Telephone Encounter (Signed)
Ok to refill 

## 2021-03-08 NOTE — Telephone Encounter (Signed)
Pt needs refill on Pitavastatin sent to [facility]

## 2021-03-08 NOTE — Telephone Encounter (Signed)
Sent in med

## 2021-03-21 DIAGNOSIS — M545 Low back pain, unspecified: Secondary | ICD-10-CM | POA: Diagnosis not present

## 2021-03-21 DIAGNOSIS — M6281 Muscle weakness (generalized): Secondary | ICD-10-CM | POA: Diagnosis not present

## 2021-04-07 DIAGNOSIS — M6281 Muscle weakness (generalized): Secondary | ICD-10-CM | POA: Diagnosis not present

## 2021-04-07 DIAGNOSIS — M545 Low back pain, unspecified: Secondary | ICD-10-CM | POA: Diagnosis not present

## 2021-04-14 DIAGNOSIS — M6281 Muscle weakness (generalized): Secondary | ICD-10-CM | POA: Diagnosis not present

## 2021-04-14 DIAGNOSIS — M545 Low back pain, unspecified: Secondary | ICD-10-CM | POA: Diagnosis not present

## 2021-04-28 DIAGNOSIS — M545 Low back pain, unspecified: Secondary | ICD-10-CM | POA: Diagnosis not present

## 2021-04-28 DIAGNOSIS — M6281 Muscle weakness (generalized): Secondary | ICD-10-CM | POA: Diagnosis not present

## 2021-05-04 ENCOUNTER — Other Ambulatory Visit: Payer: Self-pay | Admitting: Family Medicine

## 2021-05-05 ENCOUNTER — Other Ambulatory Visit: Payer: Self-pay | Admitting: Family Medicine

## 2021-05-05 NOTE — Telephone Encounter (Signed)
Has an appt in september 

## 2021-05-18 ENCOUNTER — Other Ambulatory Visit: Payer: Self-pay | Admitting: Family Medicine

## 2021-05-18 DIAGNOSIS — E790 Hyperuricemia without signs of inflammatory arthritis and tophaceous disease: Secondary | ICD-10-CM

## 2021-06-22 ENCOUNTER — Other Ambulatory Visit: Payer: Self-pay | Admitting: Family Medicine

## 2021-08-24 ENCOUNTER — Encounter: Payer: Self-pay | Admitting: Family Medicine

## 2021-08-24 ENCOUNTER — Other Ambulatory Visit: Payer: Self-pay

## 2021-08-24 ENCOUNTER — Ambulatory Visit (INDEPENDENT_AMBULATORY_CARE_PROVIDER_SITE_OTHER): Payer: Medicare Other | Admitting: Family Medicine

## 2021-08-24 VITALS — BP 130/82 | HR 91 | Temp 98.0°F | Wt 326.2 lb

## 2021-08-24 DIAGNOSIS — E038 Other specified hypothyroidism: Secondary | ICD-10-CM | POA: Diagnosis not present

## 2021-08-24 DIAGNOSIS — Z23 Encounter for immunization: Secondary | ICD-10-CM

## 2021-08-24 DIAGNOSIS — E785 Hyperlipidemia, unspecified: Secondary | ICD-10-CM

## 2021-08-24 DIAGNOSIS — E1121 Type 2 diabetes mellitus with diabetic nephropathy: Secondary | ICD-10-CM

## 2021-08-24 DIAGNOSIS — H938X2 Other specified disorders of left ear: Secondary | ICD-10-CM

## 2021-08-24 DIAGNOSIS — E1169 Type 2 diabetes mellitus with other specified complication: Secondary | ICD-10-CM

## 2021-08-24 DIAGNOSIS — E559 Vitamin D deficiency, unspecified: Secondary | ICD-10-CM

## 2021-08-24 DIAGNOSIS — R0989 Other specified symptoms and signs involving the circulatory and respiratory systems: Secondary | ICD-10-CM

## 2021-08-24 DIAGNOSIS — R202 Paresthesia of skin: Secondary | ICD-10-CM

## 2021-08-24 DIAGNOSIS — E790 Hyperuricemia without signs of inflammatory arthritis and tophaceous disease: Secondary | ICD-10-CM

## 2021-08-24 NOTE — Progress Notes (Signed)
   Subjective:    Patient ID: CATHARINE ITKIN, female    DOB: Jan 01, 1951, 70 y.o.   MRN: [id number]  HPI Chief Complaint  Patient presents with   Diabetes    Follow up   med check    She is here for a 6 month medication management visit.   DM- Hgb A1c 6.9%  taking metformin, glimepiride and Ozempic.  Checks BS at home. BS between 115-133   HTN- taking medication without any concerns.   HL- Livalo once weekly. Intolerant to other statins.   Morbid obesity- referred to [facility] in past but states she could not afford the payment required ahead of her initial visit.   Gout in right foot- taking allopurinol 100 mg. Only one major flare up.   PAD checked by vein and vascular and she had a normal work up per patient.  States she has been having itching in her lower legs.  She also reports fleeing intermittent sharp electric type pain in her left forearm and right side.  Hx of carpel tunnel surgery.   States her left ear has been popping at times. Denies changes in hearing or URI symptoms.   Other doctors caring for patient include: Dr. Caprice Beaver ENT, Dr. Ninfa Linden ortho, Dr. Watt Climes GI, Pulmonologist- Dr. Margaretha Seeds  Denies fever, chills, dizziness, chest pain, palpitations, shortness of breath, abdominal pain, N/V/D.   Reviewed allergies, medications, past medical, surgical, family, and social history.    Review of Systems Pertinent positives and negatives in the history of present illness.     Objective:   Physical Exam BP 130/82   Pulse 91   Temp 98 F (36.7 C)   Wt (!) 326 lb 3.2 oz (148 kg)   LMP  (LMP Unknown)   SpO2 97%   BMI 51.86 kg/m         Assessment & Plan:  Type 2 diabetes mellitus with diabetic nephropathy, unspecified whether long term insulin use (HCC) - Plan: CBC with Differential/Platelet, Comprehensive metabolic panel, TSH, T4, free, T3, Vitamin B12, Hemoglobin A1c, Microalbumin / creatinine urine ratio BS appear to be controlled. Continue  current medications.  Follow up pending results.   Hyperlipidemia associated with type 2 diabetes mellitus ([place]) -continue Livalo. Recommend low fat diet.   Severe obesity (BMI >= 40) (HCC) -reports being unable to afford [facility]. Continue working on healthy diet   Subclinical hypothyroidism - Plan: TSH, T4, free, T3 -follow up pending labs   Paresthesias - Plan: TSH, T4, free, T3, Iron, Vitamin B12 -follow up pending labs   Elevated uric acid in blood - Plan: Uric acid -no recent gout attacks. Adjust allopurinol dose as needed.   Decreased pulses in feet ABI testing ordered.   Vitamin D deficiency - Plan: VITAMIN D 25 Hydroxy (Vit-D Deficiency, Fractures)  Needs flu shot - Plan: Flu Vaccine QUAD High Dose(Fluad)  Ear popping, left -normal exam.

## 2021-08-25 LAB — CBC WITH DIFFERENTIAL/PLATELET
Basophils Absolute: 0 10*3/uL (ref 0.0–0.2)
Basos: 0 %
EOS (ABSOLUTE): 0.1 10*3/uL (ref 0.0–0.4)
Eos: 1 %
Hematocrit: 33.3 % — ABNORMAL LOW (ref 34.0–46.6)
Hemoglobin: 11.2 g/dL (ref 11.1–15.9)
Immature Grans (Abs): 0 10*3/uL (ref 0.0–0.1)
Immature Granulocytes: 0 %
Lymphocytes Absolute: 1.3 10*3/uL (ref 0.7–3.1)
Lymphs: 20 %
MCH: 30.6 pg (ref 26.6–33.0)
MCHC: 33.6 g/dL (ref 31.5–35.7)
MCV: 91 fL (ref 79–97)
Monocytes Absolute: 0.4 10*3/uL (ref 0.1–0.9)
Monocytes: 6 %
Neutrophils Absolute: 4.8 10*3/uL (ref 1.4–7.0)
Neutrophils: 73 %
Platelets: 296 10*3/uL (ref 150–450)
RBC: 3.66 x10E6/uL — ABNORMAL LOW (ref 3.77–5.28)
RDW: 13.1 % (ref 11.7–15.4)
WBC: 6.7 10*3/uL (ref 3.4–10.8)

## 2021-08-25 LAB — COMPREHENSIVE METABOLIC PANEL
ALT: 9 IU/L (ref 0–32)
AST: 13 IU/L (ref 0–40)
Albumin/Globulin Ratio: 1.4 (ref 1.2–2.2)
Albumin: 4.1 g/dL (ref 3.8–4.8)
Alkaline Phosphatase: 73 IU/L (ref 44–121)
BUN/Creatinine Ratio: 18 (ref 12–28)
BUN: 20 mg/dL (ref 8–27)
Bilirubin Total: <0.2 mg/dL (ref 0.0–1.2)
CO2: 24 mmol/L (ref 20–29)
Calcium: 10 mg/dL (ref 8.7–10.3)
Chloride: 99 mmol/L (ref 96–106)
Creatinine, Ser: 1.13 mg/dL — ABNORMAL HIGH (ref 0.57–1.00)
Globulin, Total: 2.9 g/dL (ref 1.5–4.5)
Glucose: 103 mg/dL — ABNORMAL HIGH (ref 65–99)
Potassium: 4.4 mmol/L (ref 3.5–5.2)
Sodium: 138 mmol/L (ref 134–144)
Total Protein: 7 g/dL (ref 6.0–8.5)
eGFR: 53 mL/min/{1.73_m2} — ABNORMAL LOW (ref >59)

## 2021-08-25 LAB — TSH: TSH: 2.6 u[IU]/mL (ref 0.450–4.500)

## 2021-08-25 LAB — IRON: Iron: 42 ug/dL (ref 27–139)

## 2021-08-25 LAB — URIC ACID: Uric Acid: 7 mg/dL (ref 3.0–7.2)

## 2021-08-25 LAB — VITAMIN D 25 HYDROXY (VIT D DEFICIENCY, FRACTURES): Vit D, 25-Hydroxy: 53 ng/mL (ref 30.0–100.0)

## 2021-08-25 LAB — T3: T3, Total: 108 ng/dL (ref 71–180)

## 2021-08-25 LAB — HEMOGLOBIN A1C
Est. average glucose Bld gHb Est-mCnc: 154 mg/dL
Hgb A1c MFr Bld: 7 % — ABNORMAL HIGH (ref 4.8–5.6)

## 2021-08-25 LAB — MICROALBUMIN / CREATININE URINE RATIO
Creatinine, Urine: 101.4 mg/dL
Microalb/Creat Ratio: 10 mg/g creat (ref 0–29)
Microalbumin, Urine: 10.1 ug/mL

## 2021-08-25 LAB — T4, FREE: Free T4: 1.44 ng/dL (ref 0.82–1.77)

## 2021-08-25 LAB — VITAMIN B12: Vitamin B-12: >2000 pg/mL — ABNORMAL HIGH (ref 232–1245)

## 2021-09-01 ENCOUNTER — Telehealth: Payer: Self-pay

## 2021-09-01 ENCOUNTER — Other Ambulatory Visit: Payer: Self-pay | Admitting: Family Medicine

## 2021-09-01 ENCOUNTER — Other Ambulatory Visit: Payer: Self-pay

## 2021-09-01 DIAGNOSIS — R202 Paresthesia of skin: Secondary | ICD-10-CM

## 2021-09-01 MED ORDER — COLCHICINE 0.6 MG PO TABS: 0.6000 mg | ORAL_TABLET | Freq: Two times a day (BID) | ORAL | 0 refills | Status: DC

## 2021-09-01 NOTE — Telephone Encounter (Signed)
Pt. Also stated that we were supposed to do a referral for her to neurology for a few different issues lt. Arm and hand has electric shocks in it and legs feel itchy. I figured out which pt. It was that called with this.

## 2021-09-02 ENCOUNTER — Ambulatory Visit (HOSPITAL_COMMUNITY)
Admission: RE | Admit: 2021-09-02 | Discharge: 2021-09-02 | Disposition: A | Discharge status: Home / Self Care | Payer: Medicare Other | Source: Ambulatory Visit | Attending: Family Medicine | Admitting: Family Medicine

## 2021-09-02 ENCOUNTER — Other Ambulatory Visit: Payer: Self-pay

## 2021-09-02 DIAGNOSIS — R0989 Other specified symptoms and signs involving the circulatory and respiratory systems: Secondary | ICD-10-CM | POA: Diagnosis not present

## 2021-09-02 DIAGNOSIS — R202 Paresthesia of skin: Secondary | ICD-10-CM | POA: Diagnosis not present

## 2021-09-09 ENCOUNTER — Telehealth: Payer: Self-pay | Admitting: Family Medicine

## 2021-09-09 NOTE — Telephone Encounter (Signed)
Pt called and states that she was sent by Vickie to have a test done. She has not heard the results of test. Looks like results are in chart. Please advise pt at [phone].

## 2021-09-09 NOTE — Telephone Encounter (Signed)
Patient notified, states she had to reset her my chart message, patient has no further questions or concerns at this time

## 2021-09-13 ENCOUNTER — Other Ambulatory Visit: Payer: Self-pay | Admitting: Family Medicine

## 2021-09-15 ENCOUNTER — Other Ambulatory Visit: Payer: Self-pay | Admitting: Family Medicine

## 2021-09-15 DIAGNOSIS — E790 Hyperuricemia without signs of inflammatory arthritis and tophaceous disease: Secondary | ICD-10-CM

## 2021-09-19 ENCOUNTER — Other Ambulatory Visit: Payer: Self-pay | Admitting: Family Medicine

## 2021-09-19 DIAGNOSIS — Z1231 Encounter for screening mammogram for malignant neoplasm of breast: Secondary | ICD-10-CM

## 2021-10-02 ENCOUNTER — Other Ambulatory Visit: Payer: Self-pay | Admitting: Family Medicine

## 2021-10-18 ENCOUNTER — Ambulatory Visit
Admission: RE | Admit: 2021-10-18 | Discharge: 2021-10-18 | Disposition: A | Discharge status: Home / Self Care | Payer: Medicare Other | Source: Ambulatory Visit | Attending: Family Medicine | Admitting: Family Medicine

## 2021-10-18 ENCOUNTER — Other Ambulatory Visit: Payer: Self-pay

## 2021-10-18 DIAGNOSIS — Z1231 Encounter for screening mammogram for malignant neoplasm of breast: Secondary | ICD-10-CM

## 2021-10-20 ENCOUNTER — Other Ambulatory Visit: Payer: Self-pay

## 2021-10-20 MED ORDER — LOSARTAN POTASSIUM-HCTZ 100-12.5 MG PO TABS: 1.0000 | ORAL_TABLET | Freq: Every day | ORAL | 1 refills | Status: DC

## 2021-11-16 ENCOUNTER — Encounter: Payer: Self-pay | Admitting: Diagnostic Neuroimaging

## 2021-11-16 ENCOUNTER — Ambulatory Visit (INDEPENDENT_AMBULATORY_CARE_PROVIDER_SITE_OTHER): Payer: Medicare Other | Admitting: Diagnostic Neuroimaging

## 2021-11-16 ENCOUNTER — Other Ambulatory Visit: Payer: Self-pay

## 2021-11-16 VITALS — BP 132/76 | HR 94 | Ht 67.0 in | Wt 327.0 lb

## 2021-11-16 DIAGNOSIS — M1A0790 Idiopathic chronic gout, unspecified ankle and foot, without tophus (tophi): Secondary | ICD-10-CM

## 2021-11-16 DIAGNOSIS — E1142 Type 2 diabetes mellitus with diabetic polyneuropathy: Secondary | ICD-10-CM | POA: Diagnosis not present

## 2021-11-16 DIAGNOSIS — M79671 Pain in right foot: Secondary | ICD-10-CM | POA: Diagnosis not present

## 2021-11-16 DIAGNOSIS — M79672 Pain in left foot: Secondary | ICD-10-CM

## 2021-11-16 NOTE — Progress Notes (Signed)
[facility]  PATIENT: [name] DOB: [birth year]  REFERRING CLINICIAN: Girtha Rm, PA-C HISTORY FROM: patient  REASON FOR VISIT: new consult   HISTORICAL  CHIEF COMPLAINT:  Chief Complaint  Patient presents with   New Patient (Initial Visit)    Rm 1 alone here for consult on numbness in bilateral feet. Pt has hx of gout and medication for gout seems to be helping her symptoms.     HISTORY OF PRESENT ILLNESS:   70 year old female with diabetes here for evaluation of numbness and pain in toes.    Symptoms started around August 2022.  Initially she had pain and swelling in her toes.  Uric acid was elevated and she was started on gout treatments in September 2022.  Symptoms have significantly improved since that time.  Patient has long history of diabetes, on right medications.  A1c's have ranged from just below 6 to over 8.  She has some slight numbness in toes.  She maintains good foot hygiene.  Her balance and walking is fair with no major issues.  She has had some chronic low back pain has had MRI of the lumbar spine in the past.  No significant lumbar radiating pain symptoms at this time.   REVIEW OF SYSTEMS: Full 14 system review of systems performed and negative with exception of: As per HPI.  ALLERGIES: Allergies  Allergen Reactions   Other Other (See Comments)    NO Blood Transfusions, or giving blood   Sulfa Antibiotics Rash    HOME MEDICATIONS: Outpatient Medications Prior to Visit  Medication Sig Dispense Refill   ACCU-CHEK GUIDE test strip TEST 1 TO 2 TIMES DAILY 200 strip 1   Accu-Chek Softclix Lancets lancets TEST 1 TO 2 TIMES DAILY 100 each 1   allopurinol (ZYLOPRIM) 100 MG tablet TAKE 1 TABLET(100 MG) BY MOUTH DAILY (Patient taking differently: 200 mg.) 90 tablet 0   Blood Glucose Monitoring Suppl (SOLUS V2 BLOOD GLUCOSE SYSTEM) DEVI Test 1-2 times a day Dx E11.9 1 each 0   colchicine 0.6 MG tablet Take 1 tablet (0.6 mg total)  by mouth 2 (two) times daily. 10 tablet 0   Cyanocobalamin (VITAMIN B-12 PO) Take by mouth.     glimepiride (AMARYL) 2 MG tablet TAKE 1 TABLET BY MOUTH  DAILY WITH BREAKFAST 90 tablet 3   LIVALO 4 MG TABS TAKE 1 TABLET BY MOUTH  TWICE WEEKLY 26 tablet 1   losartan-hydrochlorothiazide (HYZAAR) 100-12.5 MG tablet Take 1 tablet by mouth daily. 90 tablet 1   MAGNESIUM CITRATE PO Take by mouth.     meclizine (ANTIVERT) 25 MG tablet Take 1 tablet (25 mg total) by mouth 3 (three) times daily as needed for dizziness. 21 tablet 0   metFORMIN (GLUCOPHAGE) 1000 MG tablet TAKE 1 TABLET BY MOUTH  TWICE DAILY WITH MEALS 180 tablet 3   Multiple Vitamin (MULTIVITAMIN) tablet Take 1 tablet by mouth every other day.     naproxen sodium (ALEVE) 220 MG tablet Take by mouth.     Semaglutide, 1 MG/DOSE, (OZEMPIC, 1 MG/DOSE,) 2 MG/1.5ML SOPN Inject 1 mg into the skin once a week. 12 mL 2   No facility-administered medications prior to visit.    PAST MEDICAL HISTORY: Past Medical History:  Diagnosis Date   Abdominal hernia    Acute blood loss as cause of postoperative anemia 02/16/2017   Arthritis    knees, back, R ankle    Bilateral carpal tunnel syndrome 04/29/2012   Diabetes mellitus  Diabetes mellitus type 2 in obese ([place]) 12/31/2015   Edema    legs   GERD (gastroesophageal reflux disease)    HOH (hard of hearing)    Hypertension    Lipoma 09/30/2012   Obesity    morbid obesity   OBESITY, MORBID 10/16/2007   Qualifier: Diagnosis of  By: Enrique Sack     Osteoarthrosis involving lower leg 10/16/2007   Qualifier: Diagnosis of  By: Enrique Sack     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   right   CARPAL TUNNEL  RELEASE Left 07/10/2014   Procedure: LEFT ENDOSCOPIC CARPAL TUNNEL RELEASE ;  Surgeon: Jolyn Nap, MD;  Location: [place];  Service: Orthopedics;  Laterality: Left;   CARPAL TUNNEL RELEASE Right    COLONOSCOPY     HERNIA REPAIR  07/03/2011   abd hernia   NOSE SURGERY  2008   sinus   TOTAL KNEE ARTHROPLASTY Left 02/06/2017   Procedure: LEFT TOTAL KNEE ARTHROPLASTY;  Surgeon: Mcarthur Rossetti, MD;  Location: [place];  Service: Orthopedics;  Laterality: Left;   TOTAL KNEE ARTHROPLASTY Right 08/21/2017   TOTAL KNEE ARTHROPLASTY Right 08/21/2017   Procedure: RIGHT TOTAL KNEE ARTHROPLASTY;  Surgeon: Mcarthur Rossetti, MD;  Location: [place];  Service: Orthopedics;  Laterality: Right;   TUBAL LIGATION      FAMILY HISTORY: Family History  Problem Relation Age of Onset   Hypertension Mother    Kidney disease Mother    Diabetes Father    Hypertension Father    Diabetes Sister    Hypertension Sister    Diabetes Brother    Hypertension Brother    Heart disease Brother 10       heart attack   Hypertension Sister     SOCIAL HISTORY: Social History   Socioeconomic History   Marital status: Single    Spouse name: Not on file   Number of children: 2   Years of education: Not on file   Highest education level: Not on file  Occupational History   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  Tobacco Use   Smoking status: Never   Smokeless tobacco: Never  Vaping Use   Vaping Use: Never used  Substance and Sexual Activity   Alcohol use: No   Drug use: No   Sexual activity: Never  Other Topics Concern   Not on file  Social History Narrative   Admitted to [facility] 08/24/17   Divorced   Jehovah Witness    Never smoked   Alcohol none   Full Code   Right handed    Some caffeine in take    Social Determinants of Health   Financial Resource Strain: Not on file  Food Insecurity: Not on file  Transportation Needs: Not on  file  Physical Activity: Not on file  Stress: Not on file  Social Connections: Not on file  Intimate Partner Violence: Not on file     PHYSICAL EXAM  GENERAL EXAM/CONSTITUTIONAL: Vitals:  Vitals:   11/16/21 0819  BP: 132/76  Pulse: 94  SpO2: 92%  Weight: (!) 327 lb (148.3 kg)  Height: 5\' 7"  (1.702 m)   Body mass  index is 51.22 kg/m. Wt Readings from Last 3 Encounters:  11/16/21 (!) 327 lb (148.3 kg)  08/24/21 (!) 326 lb 3.2 oz (148 kg)  02/16/21 (!) 332 lb 12.8 oz (151 kg)   Patient is in no distress; well developed, nourished and groomed; neck is supple  CARDIOVASCULAR: Examination of carotid arteries is normal; no carotid bruits Regular rate and rhythm, no murmurs Examination of peripheral vascular system by observation and palpation is normal  EYES: Ophthalmoscopic exam of optic discs and posterior segments is normal; no papilledema or hemorrhages No results found.  MUSCULOSKELETAL: Gait, strength, tone, movements noted in Neurologic exam below  NEUROLOGIC: MENTAL STATUS:  No flowsheet data found. awake, alert, oriented to person, place and time recent and remote memory intact normal attention and concentration language fluent, comprehension intact, naming intact fund of knowledge appropriate  CRANIAL NERVE:  2nd - no papilledema on fundoscopic exam 2nd, 3rd, 4th, 6th - pupils equal and reactive to light, visual fields full to confrontation, extraocular muscles intact, no nystagmus 5th - facial sensation symmetric 7th - facial strength symmetric 8th - hearing intact 9th - palate elevates symmetrically, uvula midline 11th - shoulder shrug symmetric 12th - tongue protrusion midline  MOTOR:  normal bulk and tone, full strength in the BUE, BLE  SENSORY:  normal and symmetric to light touch, temperature, vibration; EXCEPT SLIGHTLY DECREASED IN TOES  COORDINATION:  finger-nose-finger, fine finger movements normal  REFLEXES:  deep tendon reflexes  TRACE and symmetric  GAIT/STATION:  narrow based gait     DIAGNOSTIC DATA (LABS, IMAGING, TESTING) - I reviewed patient records, labs, notes, testing and imaging myself where available.  Lab Results  Component Value Date   WBC 6.7 08/24/2021   HGB 11.2 08/24/2021   HCT 33.3 (L) 08/24/2021   MCV 91 08/24/2021   PLT 296 08/24/2021      Component Value Date/Time   NA 138 08/24/2021 1549   K 4.4 08/24/2021 1549   CL 99 08/24/2021 1549   CO2 24 08/24/2021 1549   GLUCOSE 103 (H) 08/24/2021 1549   GLUCOSE 164 (H) 10/05/2017 1101   BUN 20 08/24/2021 1549   CREATININE 1.13 (H) 08/24/2021 1549   CREATININE 1.30 (H) 10/05/2017 1101   CALCIUM 10.0 08/24/2021 1549   PROT 7.0 08/24/2021 1549   ALBUMIN 4.1 08/24/2021 1549   AST 13 08/24/2021 1549   ALT 9 08/24/2021 1549   ALKPHOS 73 08/24/2021 1549   BILITOT <0.2 08/24/2021 1549   GFRNONAA 44 (L) 10/06/2020 1540   GFRNONAA 43 (L) 10/05/2017 1101   GFRAA 51 (L) 10/06/2020 1540   GFRAA 50 (L) 10/05/2017 1101   Lab Results  Component Value Date   CHOL 152 02/16/2021   HDL 39 (L) 02/16/2021   LDLCALC 88 02/16/2021   TRIG 141 02/16/2021   CHOLHDL 3.9 02/16/2021   Lab Results  Component Value Date   HGBA1C 7.0 (H) 08/24/2021   Lab Results  Component Value Date   VITAMINB12 >2000 (H) 08/24/2021   Lab Results  Component Value Date   TSH 2.600 08/24/2021   Uric Acid  Date Value Ref Range Status  08/24/2021 7.0 3.0 - 7.2 mg/dL Final    Comment:               Therapeutic target for gout patients: <6.0  02/16/2021 8.9 (H) 3.0 - 7.2 mg/dL Final    Comment:               Therapeutic target for gout patients: <  6.0    08/20/19 MRI lumbar spine 1. Right extraforaminal disc protrusion at L3-L4 encroaching on the right L3 nerve root. 2. Left far lateral osteophytes at L5-S1 encroaching on the left L5 nerve root. 3. Advanced lower lumbar facet arthropathy from L3-L4 through L5-S1.  9/16 22 ABI Right: Resting right  ankle-brachial index is within normal range. No  evidence of significant right lower extremity arterial disease. The right  toe-brachial index is normal.   Left: Resting left ankle-brachial index is within normal range. No  evidence of significant left lower extremity arterial disease. The left  toe-brachial index is normal.    ASSESSMENT AND PLAN  70 y.o. year old female here with:  Dx:  1. Pain in both feet   2. Chronic idiopathic gout involving toe without tophus, unspecified laterality   3. Diabetic polyneuropathy associated with type 2 diabetes mellitus (HCC)     PLAN:  PAINFUL TOES (gout) - mainly related to gout; now improved with diet and medications  NUMBNESS IN TOES - may be related to very mild diabetic neuropathy - continue diabetes treatments - foot care issues reviewed  Return for return to PCP, pending if symptoms worsen or fail to improve.    Penni Bombard, MD 32/20/2542, 7:06 AM Certified in Neurology, Neurophysiology and Neuroimaging  [facility] [address], [place], [place] [address] [phone]

## 2021-11-16 NOTE — Patient Instructions (Signed)
  PAINFUL FEET (gout) - mainly related to gout; now improved with diet and medications  NUMBNESS IN TOES - likely related to very mild diabetic neuropathy - continue diabetes treatments - foot care issues reviewed

## 2021-11-28 ENCOUNTER — Other Ambulatory Visit: Payer: Self-pay

## 2021-11-28 MED ORDER — OZEMPIC (1 MG/DOSE) 2 MG/1.5ML ~~LOC~~ SOPN: 1.0000 mg | PEN_INJECTOR | SUBCUTANEOUS | 2 refills | Status: DC

## 2021-12-09 ENCOUNTER — Telehealth: Payer: Self-pay | Admitting: Physician Assistant

## 2021-12-09 NOTE — Telephone Encounter (Signed)
[facility] sent refill request on t/s accu check guide please send to the [facility] ([place], [place]

## 2021-12-13 MED ORDER — ACCU-CHEK GUIDE VI STRP
ORAL_STRIP | 1 refills | Status: DC
Start: 2021-12-13 — End: 2024-09-16

## 2021-12-13 NOTE — Telephone Encounter (Signed)
done

## 2021-12-14 ENCOUNTER — Telehealth: Payer: Self-pay | Admitting: Physician Assistant

## 2021-12-14 ENCOUNTER — Other Ambulatory Visit: Payer: Self-pay

## 2021-12-14 DIAGNOSIS — E790 Hyperuricemia without signs of inflammatory arthritis and tophaceous disease: Secondary | ICD-10-CM

## 2021-12-14 MED ORDER — ALLOPURINOL 100 MG PO TABS: 100.0000 mg | ORAL_TABLET | Freq: Every day | ORAL | 0 refills | Status: DC

## 2021-12-14 NOTE — Telephone Encounter (Signed)
[facility] sent refill request for allopurinl please send to the [place], [place]

## 2021-12-14 NOTE — Telephone Encounter (Signed)
done

## 2022-02-19 NOTE — Progress Notes (Addendum)
[name] is a 71 y.o. female who presents for annual wellness visit and follow-up on chronic medical conditions.    [name] has the following concerns: Reports chronic low back pain after having knee replacements for about 2 - 3 years; denies falls or injuries; has been to Dr. Ninfa Linden, orthopedic surgeon, who recommended injections or exercise for the low back pain; states [name] has been to physical therapy and that didn't help; denies numbness or tingling in legs.   Immunizations and Health Maintenance Immunization History  Administered Date(s) Administered   Fluad Quad(high Dose 65+) 09/10/2019, 10/06/2020, 08/24/2021   Influenza, High Dose Seasonal PF 10/28/2018   Influenza,inj,Quad PF,6+ Mos 09/21/2016, 10/03/2017   Moderna Sars-Covid-2 Vaccination 05/25/2020, 06/22/2020, 12/29/2020, 09/09/2021   PPD Test 02/09/2017   Pneumococcal Conjugate-13 10/28/2018   Pneumococcal Polysaccharide-23 08/01/2006, 01/01/2020   Td 12/21/2003   Zoster Recombinat (Shingrix) 08/19/2021   Health Maintenance Due  Topic Date Due   FOOT EXAM  02/16/2022     Advanced directives: patient reports that [name] has a DNR on file and will look into having her living will completed with a lawyer.    Depression screen:  See questionnaire below.  Depression screen [facility] 2/9 02/20/2022 02/16/2021 06/30/2020 01/01/2020 10/28/2018  Decreased Interest 0 0 0 0 0  Down, Depressed, Hopeless 0 0 0 0 0  PHQ - 2 Score 0 0 0 0 0    Fall Risk Screen: see questionnaire below. Fall Risk  02/20/2022 02/16/2021 06/30/2020 01/01/2020 10/28/2018  Falls in the past year? 0 0 0 0 0  Number falls in past yr: 0 0 0 0 -  Injury with Fall? 0 0 0 0 -  Risk for fall due to : No Fall Risks No Fall Risks - - -  Follow up Falls evaluation completed Falls evaluation completed - - -    ADL screen:  See questionnaire below Functional Status Survey:     Review of Systems Constitutional: -, -unexpected weight change, -anorexia, -fatigue Allergy:  -sneezing, -itching, -congestion Dermatology: denies changing moles, rash, lumps ENT: -runny nose, -ear pain, -sore throat,  Cardiology:  -chest pain, -palpitations, -orthopnea, Respiratory: -cough, -shortness of breath, -dyspnea on exertion, -wheezing,  Gastroenterology: -abdominal pain, -nausea, -vomiting, -diarrhea, -constipation, -dysphagia Hematology: -bleeding or bruising problems Musculoskeletal: -arthralgias, -myalgias, -joint swelling, +back pain, - Ophthalmology: -vision changes,  Urology: -dysuria, -difficulty urinating,  -urinary frequency, -urgency, incontinence Neurology: -, -numbness, , -memory loss, -falls, -dizziness    PHYSICAL EXAM:  BP 130/70    Pulse 92    Wt (!) 333 lb 9.6 oz (151.3 kg)    LMP  (LMP Unknown)    SpO2 97%    BMI 52.25 kg/m   General Appearance: Alert, cooperative, no distress, appears stated age Head: Normocephalic, without obvious abnormality, atraumatic Eyes: PERRL, conjunctiva/corneas clear, EOM's intact, fundi benign Ears: Normal TM's and external ear canals Nose: Nares normal, mucosa normal, no drainage or sinus tenderness Throat: Lips, mucosa, and tongue normal; teeth and gums normal Neck: Supple, no lymphadenopathy;  thyroid:  no enlargement/tenderness/nodules; no carotid bruit or JVD Lungs: Clear to auscultation bilaterally without wheezes, rales or ronchi; respirations unlabored Heart: Regular rate and rhythm, S1 and S2 normal, no murmur, rubor gallop Abdomen: Soft, non-tender, nondistended, normoactive bowel sounds,  no masses, no hepatosplenomegaly Extremities: No clubbing, cyanosis or edema Pulses: 2+ and symmetric all extremities Skin:  Skin color, texture, turgor normal, no rashes or lesions Lymph nodes: Cervical, supraclavicular, and axillary nodes normal Neurologic:  CNII-XII intact, normal  strength, sensation and gait; reflexes 2+ and symmetric throughout Psych: Normal mood, affect, hygiene and grooming.  Narrative &  Impression  CLINICAL DATA:  Chronic back pain without radiculopathy.   EXAM: MRI LUMBAR SPINE WITHOUT CONTRAST   TECHNIQUE: Multiplanar, multisequence MR imaging of the lumbar spine was performed. No intravenous contrast was administered.   COMPARISON:  Lumbar spine x-rays dated January 06, 2019.   FINDINGS: Segmentation:  Standard.   Alignment:  Trace anterolisthesis at L4-L5 and L5-S1.   Vertebrae: Diffuse marrow heterogeneity without focal lesion. No fracture or evidence of discitis.   Conus medullaris and cauda equina: Low lying conus extends to the L2-L3 level. No fatty filum terminale. Cauda equina appears normal.   Paraspinal and other soft tissues: Bilateral renal cysts. Sigmoid diverticulosis.   Disc levels:   T9-T10 to T11-T12: Anterior spurring. No disc bulging or herniation. No stenosis.   T12-L1: Anterior disc osteophyte complex. Mild left facet arthropathy. No stenosis.   L1-L2:  Negative.   L2-L3:  Negative.   L3-L4: Mild disc bulging with superimposed right extraforaminal disc protrusion encroaching on the right L3 nerve root. Moderate bilateral facet arthropathy. No stenosis.   L4-L5: Minimal disc bulging. Severe bilateral facet arthropathy. No stenosis.   L5-S1: Minimal disc bulging. Left far lateral osteophytes encroach on the left L5 nerve root. Severe bilateral facet arthropathy. No stenosis.   IMPRESSION: 1. Right extraforaminal disc protrusion at L3-L4 encroaching on the right L3 nerve root. 2. Left far lateral osteophytes at L5-S1 encroaching on the left L5 nerve root. 3. Advanced lower lumbar facet arthropathy from L3-L4 through L5-S1.     Electronically Signed   By: Titus Dubin M.D.   On: 08/20/2019 15:00    ASSESSMENT/PLAN: Assessment: Encounter Diagnoses  Name Primary?   Medicare annual wellness visit, subsequent Yes   Type 2 diabetes mellitus with diabetic nephropathy, without long-term current use of insulin (HCC)     Mixed hyperlipidemia    Primary hypertension    Elevated uric acid in blood    Severe obesity (BMI >= 40) (HCC)    Elevated serum creatinine      Plan:   [name] was seen today for annual exam.  Diagnoses and all orders for this visit:  Medicare annual wellness visit, subsequent -     CBC with Differential/Platelet -     Comprehensive metabolic panel -     Hemoglobin A1c -     Lipid panel  Type 2 diabetes mellitus with diabetic nephropathy, without long-term current use of insulin (HCC) -     Comprehensive metabolic panel -     Hemoglobin A1c  Mixed hyperlipidemia -     Pitavastatin Calcium (LIVALO) 4 MG TABS; Take 1 tablet (4 mg total) by mouth 2 (two) times a week. -     Lipid panel  Primary hypertension -     losartan-hydrochlorothiazide (HYZAAR) 100-12.5 MG tablet; Take 1 tablet by mouth daily.  Elevated uric acid in blood -     allopurinol (ZYLOPRIM) 100 MG tablet; Take 1 tablet (100 mg total) by mouth daily. TAKE 1 TABLET(100 MG) BY MOUTH DAILY Strength: 100 mg  Severe obesity (BMI >= 40) (HCC)  Elevated serum creatinine -     Comprehensive metabolic panel   Will refer to a Neurosurgeon  Discussed monthly self breast exams and yearly mammograms; at least 30 minutes of aerobic activity at least 5 days/week and weight-bearing exercise 2x/week; proper sunscreen use reviewed; healthy diet, including goals of calcium and vitamin  D intake and alcohol recommendations (less than or equal to 1 drink/day) reviewed; regular seatbelt use; changing batteries in smoke detectors.  Immunization recommendations discussed.  Colonoscopy recommendations reviewed   Medicare Attestation I have personally reviewed: The patient's medical and social history Their use of alcohol, tobacco or illicit drugs Their current medications and supplements The patient's functional ability including ADLs,fall risks, home safety risks, cognitive, and hearing and visual impairment Diet and  physical activities Evidence for depression or mood disorders  The patient's weight, height, and BMI have been recorded in the chart.  I have made referrals, counseling, and provided education to the patient based on review of the above and I have provided the patient with a written personalized care plan for preventive services.     Irene Pap, PA-C   02/20/2022

## 2022-02-20 ENCOUNTER — Encounter: Payer: Self-pay | Admitting: Physician Assistant

## 2022-02-20 ENCOUNTER — Ambulatory Visit (INDEPENDENT_AMBULATORY_CARE_PROVIDER_SITE_OTHER): Payer: Medicare Other | Admitting: Physician Assistant

## 2022-02-20 VITALS — BP 130/70 | HR 92 | Ht 67.0 in | Wt 333.6 lb

## 2022-02-20 DIAGNOSIS — R7989 Other specified abnormal findings of blood chemistry: Secondary | ICD-10-CM | POA: Diagnosis not present

## 2022-02-20 DIAGNOSIS — M47816 Spondylosis without myelopathy or radiculopathy, lumbar region: Secondary | ICD-10-CM | POA: Insufficient documentation

## 2022-02-20 DIAGNOSIS — E1121 Type 2 diabetes mellitus with diabetic nephropathy: Secondary | ICD-10-CM

## 2022-02-20 DIAGNOSIS — Z0000 Encounter for general adult medical examination without abnormal findings: Secondary | ICD-10-CM | POA: Diagnosis not present

## 2022-02-20 DIAGNOSIS — M5126 Other intervertebral disc displacement, lumbar region: Secondary | ICD-10-CM | POA: Diagnosis not present

## 2022-02-20 DIAGNOSIS — E790 Hyperuricemia without signs of inflammatory arthritis and tophaceous disease: Secondary | ICD-10-CM | POA: Diagnosis not present

## 2022-02-20 DIAGNOSIS — M2578 Osteophyte, vertebrae: Secondary | ICD-10-CM

## 2022-02-20 DIAGNOSIS — E782 Mixed hyperlipidemia: Secondary | ICD-10-CM | POA: Diagnosis not present

## 2022-02-20 DIAGNOSIS — I10 Essential (primary) hypertension: Secondary | ICD-10-CM

## 2022-02-20 MED ORDER — LIVALO 4 MG PO TABS: 1.0000 | ORAL_TABLET | ORAL | 3 refills | Status: DC

## 2022-02-20 MED ORDER — LOSARTAN POTASSIUM-HCTZ 100-12.5 MG PO TABS: 1.0000 | ORAL_TABLET | Freq: Every day | ORAL | 3 refills | Status: DC

## 2022-02-20 MED ORDER — ALLOPURINOL 100 MG PO TABS: 100.0000 mg | ORAL_TABLET | Freq: Every day | ORAL | 3 refills | Status: DC

## 2022-02-20 NOTE — Patient Instructions (Signed)
Someone will call with an appointment with a Neurosurgeon to evaluate chronic low back pain. ?

## 2022-02-21 LAB — CBC WITH DIFFERENTIAL/PLATELET
Basophils Absolute: 0 10*3/uL (ref 0.0–0.2)
Basos: 1 %
EOS (ABSOLUTE): 0.1 10*3/uL (ref 0.0–0.4)
Eos: 1 %
Hematocrit: 34.4 % (ref 34.0–46.6)
Hemoglobin: 10.8 g/dL — ABNORMAL LOW (ref 11.1–15.9)
Immature Grans (Abs): 0 10*3/uL (ref 0.0–0.1)
Immature Granulocytes: 0 %
Lymphocytes Absolute: 1.4 10*3/uL (ref 0.7–3.1)
Lymphs: 19 %
MCH: 29 pg (ref 26.6–33.0)
MCHC: 31.4 g/dL — ABNORMAL LOW (ref 31.5–35.7)
MCV: 92 fL (ref 79–97)
Monocytes Absolute: 0.4 10*3/uL (ref 0.1–0.9)
Monocytes: 6 %
Neutrophils Absolute: 5.1 10*3/uL (ref 1.4–7.0)
Neutrophils: 73 %
Platelets: 282 10*3/uL (ref 150–450)
RBC: 3.73 x10E6/uL — ABNORMAL LOW (ref 3.77–5.28)
RDW: 13.4 % (ref 11.7–15.4)
WBC: 7 10*3/uL (ref 3.4–10.8)

## 2022-02-21 LAB — COMPREHENSIVE METABOLIC PANEL
ALT: 12 IU/L (ref 0–32)
AST: 14 IU/L (ref 0–40)
Albumin/Globulin Ratio: 1.7 (ref 1.2–2.2)
Albumin: 4.2 g/dL (ref 3.8–4.8)
Alkaline Phosphatase: 84 IU/L (ref 44–121)
BUN/Creatinine Ratio: 18 (ref 12–28)
BUN: 20 mg/dL (ref 8–27)
Bilirubin Total: <0.2 mg/dL (ref 0.0–1.2)
CO2: 23 mmol/L (ref 20–29)
Calcium: 9.4 mg/dL (ref 8.7–10.3)
Chloride: 101 mmol/L (ref 96–106)
Creatinine, Ser: 1.1 mg/dL — ABNORMAL HIGH (ref 0.57–1.00)
Globulin, Total: 2.5 g/dL (ref 1.5–4.5)
Glucose: 141 mg/dL — ABNORMAL HIGH (ref 70–99)
Potassium: 4.7 mmol/L (ref 3.5–5.2)
Sodium: 138 mmol/L (ref 134–144)
Total Protein: 6.7 g/dL (ref 6.0–8.5)
eGFR: 55 mL/min/{1.73_m2} — ABNORMAL LOW (ref >59)

## 2022-02-21 LAB — HEMOGLOBIN A1C
Est. average glucose Bld gHb Est-mCnc: 157 mg/dL
Hgb A1c MFr Bld: 7.1 % — ABNORMAL HIGH (ref 4.8–5.6)

## 2022-02-21 LAB — LIPID PANEL
Chol/HDL Ratio: 4.9 ratio — ABNORMAL HIGH (ref 0.0–4.4)
Cholesterol, Total: 162 mg/dL (ref 100–199)
HDL: 33 mg/dL — ABNORMAL LOW (ref >39)
LDL Chol Calc (NIH): 91 mg/dL (ref 0–99)
Triglycerides: 224 mg/dL — ABNORMAL HIGH (ref 0–149)
VLDL Cholesterol Cal: 38 mg/dL (ref 5–40)

## 2022-03-02 DIAGNOSIS — M4310 Spondylolisthesis, site unspecified: Secondary | ICD-10-CM | POA: Diagnosis not present

## 2022-03-03 ENCOUNTER — Other Ambulatory Visit: Payer: Self-pay | Admitting: Neurosurgery

## 2022-03-03 DIAGNOSIS — M4310 Spondylolisthesis, site unspecified: Secondary | ICD-10-CM

## 2022-03-20 ENCOUNTER — Other Ambulatory Visit: Payer: Medicare Other

## 2022-03-22 ENCOUNTER — Ambulatory Visit
Admission: RE | Admit: 2022-03-22 | Discharge: 2022-03-22 | Disposition: A | Discharge status: Home / Self Care | Payer: Medicare Other | Source: Ambulatory Visit | Attending: Neurosurgery | Admitting: Neurosurgery

## 2022-03-22 DIAGNOSIS — M4310 Spondylolisthesis, site unspecified: Secondary | ICD-10-CM

## 2022-03-22 DIAGNOSIS — M545 Low back pain, unspecified: Secondary | ICD-10-CM | POA: Diagnosis not present

## 2022-03-23 ENCOUNTER — Other Ambulatory Visit: Payer: Medicare Other

## 2022-03-28 NOTE — Addendum Note (Signed)
Addended by: Francis Gaines on: 03/28/2022 11:03 AM ? ? Modules accepted: Level of Service ? ?

## 2022-04-10 ENCOUNTER — Telehealth (INDEPENDENT_AMBULATORY_CARE_PROVIDER_SITE_OTHER): Payer: Medicare Other | Admitting: Family Medicine

## 2022-04-10 ENCOUNTER — Encounter: Payer: Self-pay | Admitting: Family Medicine

## 2022-04-10 VITALS — BP 130/70 | Wt 333.0 lb

## 2022-04-10 DIAGNOSIS — R197 Diarrhea, unspecified: Secondary | ICD-10-CM | POA: Diagnosis not present

## 2022-04-10 NOTE — Progress Notes (Signed)
? ?  Subjective:  ? ? Patient ID: [name], female    DOB: 1951/09/20, 71 y.o.   MRN: [id number] ? ?HPI ?Documentation for virtual audio and video telecommunications through [place] encounter: ?The patient was located at home. 2 patient identifiers used.  ?The provider was located in the office. ?The patient did consent to this visit and is aware of possible charges through their insurance for this visit. ?The other persons participating in this telemedicine service were none. ?Time spent on call was 5 minutes and in review of previous records >17 minutes total for counseling and coordination of care. ?This virtual service is not related to other E/M service within previous 7 days.  ?She complains of a 1 week history of difficulty with diarrhea but no fever, chills, abdominal pain, foul-smelling stool.  She has not been on antibiotic recently.  She has used Pepto-Bismol and just started using Imodium.  She has been on Ozempic for the last 6 months but has not had difficulty with this in the past. ? ?Review of Systems ? ?   ?Objective:  ? Physical Exam ?And in no distress otherwise not examined alert ? ? ?   ?Assessment & Plan:  ?Diarrhea, unspecified type ?I explained to the mother that treated symptomatically.  She is to use a probiotic and take as many as 8 Imodium per day and if still having difficulty towards the end of the week, she will call. ? ?

## 2022-04-12 DIAGNOSIS — M4310 Spondylolisthesis, site unspecified: Secondary | ICD-10-CM | POA: Diagnosis not present

## 2022-04-12 DIAGNOSIS — M47816 Spondylosis without myelopathy or radiculopathy, lumbar region: Secondary | ICD-10-CM | POA: Diagnosis not present

## 2022-04-18 ENCOUNTER — Telehealth: Payer: Self-pay

## 2022-04-18 DIAGNOSIS — R197 Diarrhea, unspecified: Secondary | ICD-10-CM

## 2022-04-18 NOTE — Telephone Encounter (Signed)
Pt. Called stating she saw you last week for diarrhea and it was good over the weekend but it came back yesterday. She is not sure what to do now she was taking imodium but ran out of it yesterday morning she took the last one.  ?

## 2022-04-19 DIAGNOSIS — R197 Diarrhea, unspecified: Secondary | ICD-10-CM | POA: Diagnosis not present

## 2022-04-20 DIAGNOSIS — R197 Diarrhea, unspecified: Secondary | ICD-10-CM | POA: Diagnosis not present

## 2022-04-24 LAB — CDIFF NAA+O+P+STOOL CULTURE
E coli, Shiga toxin Assay: NEGATIVE
Toxigenic C. Difficile by PCR: NEGATIVE

## 2022-04-25 LAB — CDIFF NAA+O+P+STOOL CULTURE
E coli, Shiga toxin Assay: NEGATIVE
Toxigenic C. Difficile by PCR: NEGATIVE

## 2022-04-27 ENCOUNTER — Encounter: Payer: Self-pay | Admitting: Family Medicine

## 2022-04-27 ENCOUNTER — Ambulatory Visit (INDEPENDENT_AMBULATORY_CARE_PROVIDER_SITE_OTHER): Payer: Medicare Other | Admitting: Family Medicine

## 2022-04-27 VITALS — BP 128/74 | HR 98 | Temp 97.9°F | Wt 331.0 lb

## 2022-04-27 DIAGNOSIS — E1121 Type 2 diabetes mellitus with diabetic nephropathy: Secondary | ICD-10-CM

## 2022-04-27 DIAGNOSIS — R197 Diarrhea, unspecified: Secondary | ICD-10-CM

## 2022-04-27 NOTE — Progress Notes (Signed)
? ?  Subjective:  ? ? Patient ID: [name], female    DOB: January 06, 1951, 71 y.o.   MRN: [id number] ? ?HPI ?She is here for recheck concerning diarrhea.  She states he is having 4 loose stools per day and has done so for over a month.  She has seen no blood or pus in her stool, no foul-smelling stool.  No fever or chills.  She notes that when she eats she can get bloating and gas with any foods.  She did use a probiotic for a short period of time.  Review of her records shows that she has not lost any weight. ?She does have diabetes and her most hemoglobin A1c is in the low 7 range. ? ?Review of Systems ? ?   ?Objective:  ? Physical Exam ?Alert and in no distress.  Abdominal exam difficult to do due to her size.  Decreased bowel sounds without masses or tenderness. ? ? ? ?   ?Assessment & Plan:  ?Diarrhea, unspecified type - Plan: CBC with Differential/Platelet, Comprehensive metabolic panel ? ?Type 2 diabetes mellitus with diabetic nephropathy, without long-term current use of insulin ([place]) - Plan: CBC with Differential/Platelet, Comprehensive metabolic panel ? ?Severe obesity (BMI >= 40) (HCC) ? ? ?

## 2022-04-28 LAB — COMPREHENSIVE METABOLIC PANEL
ALT: 12 IU/L (ref 0–32)
AST: 12 IU/L (ref 0–40)
Albumin/Globulin Ratio: 1.3 (ref 1.2–2.2)
Albumin: 4 g/dL (ref 3.8–4.8)
Alkaline Phosphatase: 79 IU/L (ref 44–121)
BUN/Creatinine Ratio: 15 (ref 12–28)
BUN: 18 mg/dL (ref 8–27)
Bilirubin Total: <0.2 mg/dL (ref 0.0–1.2)
CO2: 23 mmol/L (ref 20–29)
Calcium: 10 mg/dL (ref 8.7–10.3)
Chloride: 104 mmol/L (ref 96–106)
Creatinine, Ser: 1.22 mg/dL — ABNORMAL HIGH (ref 0.57–1.00)
Globulin, Total: 3.1 g/dL (ref 1.5–4.5)
Glucose: 115 mg/dL — ABNORMAL HIGH (ref 70–99)
Potassium: 4.7 mmol/L (ref 3.5–5.2)
Sodium: 144 mmol/L (ref 134–144)
Total Protein: 7.1 g/dL (ref 6.0–8.5)
eGFR: 48 mL/min/{1.73_m2} — ABNORMAL LOW (ref >59)

## 2022-04-28 LAB — CBC WITH DIFFERENTIAL/PLATELET
Basophils Absolute: 0 10*3/uL (ref 0.0–0.2)
Basos: 0 %
EOS (ABSOLUTE): 0.1 10*3/uL (ref 0.0–0.4)
Eos: 1 %
Hematocrit: 34 % (ref 34.0–46.6)
Hemoglobin: 10.8 g/dL — ABNORMAL LOW (ref 11.1–15.9)
Immature Grans (Abs): 0 10*3/uL (ref 0.0–0.1)
Immature Granulocytes: 1 %
Lymphocytes Absolute: 1.2 10*3/uL (ref 0.7–3.1)
Lymphs: 14 %
MCH: 28.7 pg (ref 26.6–33.0)
MCHC: 31.8 g/dL (ref 31.5–35.7)
MCV: 90 fL (ref 79–97)
Monocytes Absolute: 0.4 10*3/uL (ref 0.1–0.9)
Monocytes: 5 %
Neutrophils Absolute: 7 10*3/uL (ref 1.4–7.0)
Neutrophils: 79 %
Platelets: 287 10*3/uL (ref 150–450)
RBC: 3.76 x10E6/uL — ABNORMAL LOW (ref 3.77–5.28)
RDW: 13.9 % (ref 11.7–15.4)
WBC: 8.8 10*3/uL (ref 3.4–10.8)

## 2022-04-28 NOTE — Addendum Note (Signed)
Addended by: Denita Lung on: 04/28/2022 04:57 PM ? ? Modules accepted: Orders ? ?

## 2022-05-02 ENCOUNTER — Encounter: Payer: Self-pay | Admitting: Physician Assistant

## 2022-05-02 DIAGNOSIS — M47816 Spondylosis without myelopathy or radiculopathy, lumbar region: Secondary | ICD-10-CM | POA: Diagnosis not present

## 2022-05-08 ENCOUNTER — Other Ambulatory Visit: Payer: Self-pay | Admitting: Family Medicine

## 2022-05-10 ENCOUNTER — Other Ambulatory Visit: Payer: Self-pay | Admitting: Family Medicine

## 2022-05-11 ENCOUNTER — Telehealth: Payer: Self-pay | Admitting: Physician Assistant

## 2022-05-11 ENCOUNTER — Other Ambulatory Visit: Payer: Self-pay

## 2022-05-11 ENCOUNTER — Telehealth: Payer: Self-pay

## 2022-05-11 MED ORDER — OZEMPIC (1 MG/DOSE) 2 MG/1.5ML ~~LOC~~ SOPN: 1.0000 mg | PEN_INJECTOR | SUBCUTANEOUS | 3 refills | Status: DC

## 2022-05-11 NOTE — Telephone Encounter (Signed)
Pt called and states that she needs her Ozempic refilled. Apparently pharmacy sent to [facility] instead of [place]. She is almost out. Please send to [facility] at [facility].

## 2022-05-23 ENCOUNTER — Encounter: Payer: Self-pay | Admitting: Physician Assistant

## 2022-05-23 ENCOUNTER — Ambulatory Visit (INDEPENDENT_AMBULATORY_CARE_PROVIDER_SITE_OTHER): Payer: Medicare Other | Admitting: Physician Assistant

## 2022-05-23 VITALS — BP 128/72 | HR 90 | Ht 67.0 in | Wt 329.2 lb

## 2022-05-23 DIAGNOSIS — R197 Diarrhea, unspecified: Secondary | ICD-10-CM

## 2022-05-23 NOTE — Patient Instructions (Addendum)
If you are age 69 or older, your body mass index should be between 23-30. Your Body mass index is 51.57 kg/m. If this is out of the aforementioned range listed, please consider follow up with your Primary Care Provider. ________________________________________________________  The [facility] providers would like to encourage you to use [facility] to communicate with providers for non-urgent requests or questions.  Due to long hold times on the telephone, sending your provider a message by [facility] may be a faster and more efficient way to get a response.  Please allow 48 business hours for a response.  Please remember that this is for non-urgent requests.  _______________________________________________________  Stop using Ozempic for the next 2 weeks.  Call or message the office and speak with Beth in 2 weeks to update her on how your symptoms are.  Follow up pending.  Thank you for entrusting me with your care and choosing [facility].  Amy Esterwood, PA-C

## 2022-05-23 NOTE — Progress Notes (Signed)
Subjective:    Patient ID: [name], female    DOB: June 04, 1951, 71 y.o.   MRN: [id number]  HPI [name] is a 71 year old African-American female, new to GI today referred by Dr. Redmond School for evaluation of persistent diarrhea. Patient had previously undergone colonoscopy in 2015 per Dr. Watt Climes.  She was noted to have small internal and external hemorrhoids, multiple diverticuli in the sigmoid and descending colon and a lipoma in the proximal transverse colon.  This was biopsied and biopsies were consistent with a lipoma.  She did not have any polyps. She has history of hypertension, morbid obesity, adult onset diabetes mellitus, GERD, hyperlipidemia and chronic kidney disease.  She had onset of diarrhea in April 2023 and says that her symptoms persisted over the past 7 to 8 weeks.  She was seen by primary care, initially given antidiarrheals and then when symptoms persisted had stool cultures done including stool for C. difficile on 04/19/2022 which were negative. Most recent labs 04/27/2022 WBC 8.8/hemoglobin 10.8/hematocrit 34/platelets 287 Creatinine 1.22 LFTs within normal limits. She has not had any abdominal imaging.  Patient says her symptoms have improved over the past 2 weeks, stopping over the past 2 weekends but then having some mild recurrence during the week.  She is also been having ongoing issues over the past several months with abdominal gassiness and noisiness.  At its worst she was having 3-4 loose bowel movements per day usually exacerbated by eating, no melena or hematochezia. Over this past 2 weekends that her bowel movements had gone back to normal.  She says she was told to eat a lot of watermelon to avoid dehydration and had done that this past Sunday and then Monday had loose stools again.  Today had a normal bowel movement. Reviewing her medications she is on Ozempic which was started about 5 months ago.  She is on weekly dosing of 2 mg, due for next dose tomorrow. This is  her newest medication, she is also on Glucophage and Laval low which can have side effects of diarrhea but she has been on stable doses of these medications over the past year or so.  Family history negative for colon cancer  Review of Systems Pertinent positive and negative review of systems were noted in the above HPI section.  All other review of systems was otherwise negative.   Outpatient Encounter Medications as of 05/23/2022  Medication Sig   Accu-Chek Softclix Lancets lancets TEST 1 TO 2 TIMES DAILY   allopurinol (ZYLOPRIM) 100 MG tablet Take 1 tablet (100 mg total) by mouth daily. TAKE 1 TABLET(100 MG) BY MOUTH DAILY Strength: 100 mg   Blood Glucose Monitoring Suppl (SOLUS V2 BLOOD GLUCOSE SYSTEM) DEVI Test 1-2 times a day Dx E11.9   Cyanocobalamin (VITAMIN B-12 PO) Take 1 tablet by mouth daily at 2 PM.   glimepiride (AMARYL) 2 MG tablet TAKE 1 TABLET BY MOUTH  DAILY WITH BREAKFAST   glucose blood (ACCU-CHEK GUIDE) test strip TEST 1 TO 2 TIMES DAILY   losartan-hydrochlorothiazide (HYZAAR) 100-12.5 MG tablet Take 1 tablet by mouth daily.   MAGNESIUM CITRATE PO Take 1 tablet by mouth daily as needed.   meclizine (ANTIVERT) 25 MG tablet Take 1 tablet (25 mg total) by mouth 3 (three) times daily as needed for dizziness.   metFORMIN (GLUCOPHAGE) 1000 MG tablet TAKE 1 TABLET BY MOUTH  TWICE DAILY WITH MEALS   Multiple Vitamin (MULTIVITAMIN) tablet Take 1 tablet by mouth daily.   naproxen sodium (ALEVE) 220  MG tablet Take 220 mg by mouth daily as needed.   Pitavastatin Calcium (LIVALO) 4 MG TABS Take 1 tablet (4 mg total) by mouth 2 (two) times a week.   Semaglutide, 1 MG/DOSE, (OZEMPIC, 1 MG/DOSE,) 2 MG/1.5ML SOPN Inject 1 mg into the skin once a week.   [DISCONTINUED] colchicine 0.6 MG tablet Take 1 tablet (0.6 mg total) by mouth 2 (two) times daily. (Patient not taking: Reported on 05/23/2022)   [DISCONTINUED] MODERNA COVID-19 BIVAL BOOSTER 50 MCG/0.5ML injection  (Patient not taking:  Reported on 05/23/2022)   [DISCONTINUED] OZEMPIC, 1 MG/DOSE, 4 MG/3ML SOPN Inject 1 mg into the skin once a week. (Patient not taking: Reported on 05/23/2022)   No facility-administered encounter medications on file as of 05/23/2022.   Allergies  Allergen Reactions   Other Other (See Comments)    NO Blood Transfusions, or giving blood   Sulfa Antibiotics Rash   Patient Active Problem List   Diagnosis Date Noted   Mixed hyperlipidemia 02/20/2022   Primary hypertension 02/20/2022   Elevated serum creatinine 02/20/2022   Elevated uric acid in blood 02/20/2022   Protrusion of lumbar intervertebral disc 02/20/2022   Osteophyte of spine 02/20/2022   Lumbar facet arthropathy 02/20/2022   DNR (do not resuscitate) 02/16/2021   Stress incontinence in female 02/16/2021   Needs flu shot 10/06/2020   Type 2 diabetes mellitus with diabetic nephropathy ([place]) 10/06/2020   Dysosmia 08/24/2020   Sensorineural hearing loss (SNHL), bilateral 08/24/2020   Advanced directive placed in chart this admission 01/01/2020   Chronic bronchitis ([place]) 06/09/2019   Chronic kidney disease 06/09/2019   Environmental and seasonal allergies 05/23/2019   Bilateral leg edema 05/23/2019   Severe obesity (BMI >= 40) ([place]) 01/06/2019   Estrogen deficiency 10/28/2018   Benign positional vertigo 08/30/2017   Hyperlipidemia associated with type 2 diabetes mellitus ([place]) 08/30/2017   Acute gouty arthritis 08/30/2017   Status post total right knee replacement 08/21/2017   Acute blood loss as cause of postoperative anemia 02/16/2017   GERD (gastroesophageal reflux disease) 02/16/2017   Vitamin D deficiency 02/16/2017   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   Unilateral primary osteoarthritis, right knee 01/10/2017   Unilateral primary osteoarthritis, left knee 01/10/2017   Subclinical hypothyroidism 10/11/2016   Decreased hearing of left ear 10/11/2016   Diabetes mellitus type 2 in obese ([place]) 12/31/2015   DOE  (dyspnea on exertion) 04/02/2013   Cough 02/12/2013   Lipoma 09/30/2012   CMC arthritis, thumb, degenerative 09/30/2012   Bilateral carpal tunnel syndrome 04/29/2012   Abdominal pain, left lateral 04/03/2012   Edema 08/22/2011   OBESITY, MORBID 10/16/2007   Hypertension associated with diabetes ([place]) 10/16/2007   Osteoarthrosis involving lower leg 10/16/2007   Social History   Socioeconomic History   Marital status: Single    Spouse name: Not on file   Number of children: 2   Years of education: Not on file   Highest education level: Not on file  Occupational History   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  Tobacco Use   Smoking status: Never   Smokeless tobacco: Never  Vaping Use   Vaping Use: Never used  Substance and Sexual Activity   Alcohol use: Yes    Comment: occ wine   Drug use: No   Sexual activity: Never  Other Topics Concern   Not on file  Social History Narrative   Admitted to [facility] 08/24/17   Divorced   [place]  [employer]    Never smoked   Alcohol none   Full Code   Right handed    Some caffeine in take    Social Determinants of Health   Financial Resource Strain: Not on file  Food Insecurity: Not on file  Transportation Needs: Not on file  Physical Activity: Not on file  Stress: Not on file  Social Connections: Not on file  Intimate Partner Violence: Not on file    Ms. Hanger's family history includes Diabetes in her brother, father, and sister; Heart disease (age of onset: 51) in her brother; Hypertension in her brother, father, mother, sister, and sister; Kidney disease in her mother.      Objective:    Vitals:   05/23/22 1500  BP: 128/72  Pulse: 90    Physical Exam Well-developed well-nourished older African-American female in no acute distress.  Pleasant Weight, 329 BMI 51.5  HEENT; nontraumatic normocephalic, EOMI, PE R LA, sclera anicteric. Oropharynx; not examined today Neck; supple, no  JVD Cardiovascular; regular rate and rhythm with S1-S2, no murmur rub or gallop Pulmonary; Clear bilaterally Abdomen; soft, obese nontender nondistended, no palpable mass or hepatosplenomegaly, bowel sounds are active Rectal; not done today Skin; benign exam, no jaundice rash or appreciable lesions Extremities; no clubbing cyanosis or edema skin warm and dry Neuro/Psych; alert and oriented x4, grossly nonfocal mood and affect appropriate        Assessment & Plan:   #31 71 year old African-American female with 6 to 7-week history of diarrhea, at its worst having 3-4 loose bowel movements per day with urgency postprandially. Also complaining of gassiness and increased growling/rumbling.  Stool cultures including stool for C. difficile by PCR - 04/19/2022  Symptoms have improved over the past 2 weeks but have not completely resolved.  She is having days with normal bowel movements.  I suspect that the diarrhea, gassiness and borborygmi type symptoms are secondary to Ozempic.  #2 morbid obesity-BMI 17 #3 adult onset diabetes mellitus #4 hyperlipidemia #5.  Chronic kidney disease #6.  Hypertension #7 colon cancer screening-negative colonoscopy 2015/Dr. Magod/diverticulosis and small internal/external hemorrhoids as well as a transverse colon lipoma, no polyps   Plan; patient will be established with Dr. Bryan Lemma I have asked her to hold Ozempic over the next 2 weeks and monitor her symptoms.  She will call back and speak to my nurse in 2 weeks with an update.  If she has resolution of symptoms off Ozempic then I do not think she will require further GI evaluation at this time.  If she wishes to resume the Ozempic knowing that she will have GI side effects of gassiness rumbling and loose stools, this is acceptable If she does not have any improvement in symptoms off of Ozempic we will have to consider further work-up with stool for lactoferrin, fecal elastase and possible repeat colonoscopy  which at this point in time would need to be scheduled at the hospital due to BMI greater than 51.  I hope colonoscopy can be avoided at this time. She will be indicated for repeat colonoscopy 2015 from colon cancer screening perspective.   Duayne Brideau S Raelene Trew PA-C 05/23/2022   Cc: Irene Pap, PA-C

## 2022-05-29 NOTE — Progress Notes (Signed)
Agree with the assessment and plan as outlined by Amy Esterwood, PA-C.  Neela Zecca, DO, FACG  

## 2022-06-05 ENCOUNTER — Telehealth: Payer: Self-pay | Admitting: Physician Assistant

## 2022-06-05 ENCOUNTER — Other Ambulatory Visit: Payer: Self-pay | Admitting: Physician Assistant

## 2022-06-05 MED ORDER — GLIMEPIRIDE 2 MG PO TABS: 2.0000 mg | ORAL_TABLET | Freq: Every day | ORAL | 1 refills | Status: DC

## 2022-06-05 MED ORDER — METFORMIN HCL 1000 MG PO TABS: 1000.0000 mg | ORAL_TABLET | Freq: Two times a day (BID) | ORAL | 1 refills | Status: DC

## 2022-06-05 NOTE — Telephone Encounter (Signed)
Refills ordered.

## 2022-06-05 NOTE — Telephone Encounter (Signed)
Fax refill request from [facility]  Glimepiride 2 mg #90  Metformin '1000mg'$  #180

## 2022-06-16 ENCOUNTER — Encounter: Payer: Self-pay | Admitting: Internal Medicine

## 2022-07-04 NOTE — Telephone Encounter (Signed)
ERROR

## 2022-07-25 ENCOUNTER — Encounter: Payer: Self-pay | Admitting: *Deleted

## 2022-07-25 NOTE — Progress Notes (Signed)
[facility] Quality Team Note  Name: [name] Date of Birth: 07/15/1951 MRN: [id number] Date: 07/25/2022  [facility] Quality Team has reviewed this patient's chart, please see recommendations below:  [facility] Quality Other; (Pt has upcoming appt on 08/28/2022.  She has an open gap for KED.  Would need Urine Albumin Creatinine Ratio Test to complete gap.  Pt had EGFR on 04/27/2022.)

## 2022-08-23 ENCOUNTER — Encounter: Payer: Self-pay | Admitting: Internal Medicine

## 2022-08-24 ENCOUNTER — Encounter: Payer: Medicare Other | Admitting: Physician Assistant

## 2022-08-28 ENCOUNTER — Ambulatory Visit (INDEPENDENT_AMBULATORY_CARE_PROVIDER_SITE_OTHER): Payer: Medicare Other | Admitting: Medical

## 2022-08-28 ENCOUNTER — Encounter: Payer: Medicare Other | Admitting: Physician Assistant

## 2022-08-28 VITALS — BP 122/78 | HR 77 | Wt 334.0 lb

## 2022-08-28 DIAGNOSIS — E1169 Type 2 diabetes mellitus with other specified complication: Secondary | ICD-10-CM | POA: Diagnosis not present

## 2022-08-28 DIAGNOSIS — E038 Other specified hypothyroidism: Secondary | ICD-10-CM

## 2022-08-28 DIAGNOSIS — N189 Chronic kidney disease, unspecified: Secondary | ICD-10-CM | POA: Diagnosis not present

## 2022-08-28 DIAGNOSIS — I10 Essential (primary) hypertension: Secondary | ICD-10-CM | POA: Diagnosis not present

## 2022-08-28 DIAGNOSIS — D649 Anemia, unspecified: Secondary | ICD-10-CM | POA: Diagnosis not present

## 2022-08-28 DIAGNOSIS — J42 Unspecified chronic bronchitis: Secondary | ICD-10-CM

## 2022-08-28 DIAGNOSIS — E119 Type 2 diabetes mellitus without complications: Secondary | ICD-10-CM

## 2022-08-28 DIAGNOSIS — I152 Hypertension secondary to endocrine disorders: Secondary | ICD-10-CM

## 2022-08-28 DIAGNOSIS — E559 Vitamin D deficiency, unspecified: Secondary | ICD-10-CM | POA: Diagnosis not present

## 2022-08-28 DIAGNOSIS — E1121 Type 2 diabetes mellitus with diabetic nephropathy: Secondary | ICD-10-CM

## 2022-08-28 DIAGNOSIS — E785 Hyperlipidemia, unspecified: Secondary | ICD-10-CM

## 2022-08-28 DIAGNOSIS — E1159 Type 2 diabetes mellitus with other circulatory complications: Secondary | ICD-10-CM

## 2022-08-28 DIAGNOSIS — Z789 Other specified health status: Secondary | ICD-10-CM | POA: Diagnosis not present

## 2022-08-28 DIAGNOSIS — Z23 Encounter for immunization: Secondary | ICD-10-CM

## 2022-08-28 DIAGNOSIS — E669 Obesity, unspecified: Secondary | ICD-10-CM

## 2022-08-28 MED ORDER — ALBUTEROL SULFATE HFA 108 (90 BASE) MCG/ACT IN AERS: 2.0000 | INHALATION_SPRAY | Freq: Four times a day (QID) | RESPIRATORY_TRACT | 1 refills | Status: DC | PRN

## 2022-08-28 MED ORDER — TIRZEPATIDE 2.5 MG/0.5ML ~~LOC~~ SOAJ: 2.5000 mg | SUBCUTANEOUS | 1 refills | Status: DC

## 2022-08-28 NOTE — Patient Instructions (Signed)
Diabetes Continue glucose monitoring Goal is 80-130 fasting blood sugars and hemoglobin A1c less than 7% For now let us stop Ozempic and glimepiride as they probably are giving you problems such as fatigue nausea and low sugars Begin trial of Mounjaro low-dose weekly.  As long as you tolerate this we will gradually go up over the next month to the next dose Continue metformin twice a day Work to eat a healthy low sugar diet. Get exercise as tolerated   High Blood pressure Continue losartan HCT 100/12.5 mg daily  Hyperlipidemia Stop Livalo since you are not tolerating this or any other statin I would like to maybe have you try fenofibrate or gemfibrozil over the next month but last not worry about this for the time being so we can get your diabetes medicines figured out  Chronic bronchitis Begin albuterol inhaler 2 puffs twice daily.  Lets see if this helps with some of her breathing issues If this works well we can consider adding a daily preventative inhaler that would be longer acting   Gout, elevated uric acid Continue allopurinol 100 mg daily for prevention   History of subclinical hypothyroidism Not currently on medication Updated labs today   Chronic kidney disease related to diabetes and high blood pressure Due to abnormal kidney function, and in order to protect your kidneys, I recommend you avoid medications that can harm the kidneys such as ibuprofen, Aleve, Advil, Motrin, Naprosyn, or prescription anti-inflammatories which are used for pain, inflammation, and arthritis.   You should avoid dehydration which can harm the kidneys. Drink at least 80 to 100 ounces of water daily   History of anemia Going for you may need to do a follow-up with gastroenterology to rule out other causes of anemia history of blood counts are going worse I am checking your blood counts and iron today

## 2022-08-28 NOTE — Progress Notes (Signed)
Subjective:  [name] is a 71 y.o. female who presents for Chief Complaint  Patient presents with   med check    Med check. Having some issues with ozempic being nauseated alot     Here for med check.  Was prior seeing Harland Dingwall, NP and Francis Gaines PA.  She voiced concerns to the nurse that she does not normally like to see a female provider.  She was willing to go ahead and do the visit today.  Here for chronic issues.  The only other doctor she sees regularly is doctor yearly.  She has multiple medical problems including diabetes, hypertension, hyperlipidemia, obesity, chronic bronchitis, GERD, chronic kidney disease, gout, vitamin D deficiency, statin intolerance, and anemia.  Diabetes-blood sugars are averaging fasting in the 150s.  She denies current concerns.  She does get some readings close to 80 and makes her feel bad.  She feels fatigued a lot and nauseated a lot.  She does not like to have her medicines.  Her Ozempic makes her nauseous and she thinks her glimepiride lowers her sugars.  Sometimes does not have an appetite.  Hyperlipidemia-she only takes Livalo 1 day a week as she cannot tolerate that her all the other statins as they give her back cramps.  She has also been on fish oil in the past.  She uses coenzyme every 10 but that does not help the muscle aches and cramps.  Anemia-denies any blood loss or bruising.  CKD-avoids NSAIDs, no prior kidney doctor  Hypertension-compliant with losartan HCT without complaint.  History of elevated uric acid and gout-compliant with allopurinol 100 mg daily  History of vitamin D deficiency-she takes a supplement daily.  History of chronic bronchitis-saw pulmonology 2020 was put on inhalers but did not have any follow-up.  She was unable to even do the breath test that day.  She feels fatigued a lot.  She is a lifetime non-smoker.  No hemoptysis, no cough, no fevers or night sweats.  Saw them in 2020 she did used to do  inhalers and saw some benefit.  No other aggravating or relieving factors.    No other c/o.  Past Medical History:  Diagnosis Date   Abdominal hernia    Acute blood loss as cause of postoperative anemia 02/16/2017   Arthritis    knees, back, R ankle    Bilateral carpal tunnel syndrome 04/29/2012   Diabetes mellitus    Diabetes mellitus type 2 in obese ([place]) 12/31/2015   Edema    legs   GERD (gastroesophageal reflux disease)    HOH (hard of hearing)    Hypertension    Hypertriglyceridemia 08/30/2015   Started on medication 08/30/15   Lipoma 09/30/2012   Obesity    morbid obesity   OBESITY, MORBID 10/16/2007   Qualifier: Diagnosis of  By: Enrique Sack     Osteoarthrosis involving lower leg 10/16/2007   Qualifier: Diagnosis of  By: Enrique Sack     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   Status post total right knee replacement 08/21/2017   Subclinical hypothyroidism 10/11/2016   Tachycardia 08/22/2011   Vertigo    Vitamin D deficiency 02/16/2017   Current Outpatient Medications on File Prior to Visit  Medication Sig Dispense Refill   allopurinol (ZYLOPRIM) 100 MG tablet Take 1 tablet (100 mg total) by mouth daily. TAKE 1 TABLET(100 MG) BY MOUTH DAILY Strength: 100 mg 90 tablet 3   Cyanocobalamin (VITAMIN B-12 PO) Take 1 tablet by mouth daily  at 2 PM.     losartan-hydrochlorothiazide (HYZAAR) 100-12.5 MG tablet Take 1 tablet by mouth daily. 90 tablet 3   MAGNESIUM CITRATE PO Take 1 tablet by mouth daily as needed.     meclizine (ANTIVERT) 25 MG tablet Take 1 tablet (25 mg total) by mouth 3 (three) times daily as needed for dizziness. 21 tablet 0   metFORMIN (GLUCOPHAGE) 1000 MG tablet Take 1 tablet (1,000 mg total) by mouth 2 (two) times daily with a meal. 180 tablet 1   Multiple Vitamin (MULTIVITAMIN) tablet Take 1 tablet by mouth daily.     naproxen sodium (ALEVE) 220 MG tablet Take 220 mg by mouth daily as needed.     Accu-Chek Softclix Lancets lancets TEST 1 TO  2 TIMES DAILY 100 each 1   Blood Glucose Monitoring Suppl (SOLUS V2 BLOOD GLUCOSE SYSTEM) DEVI Test 1-2 times a day Dx E11.9 1 each 0   glucose blood (ACCU-CHEK GUIDE) test strip TEST 1 TO 2 TIMES DAILY 200 strip 1   No current facility-administered medications on file prior to visit.     The following portions of the patient's history were reviewed and updated as appropriate: allergies, current medications, past family history, past medical history, past social history, past surgical history and problem list.  ROS Otherwise as in subjective above    Objective: BP 122/78   Pulse 77   Wt (!) 334 lb (151.5 kg)   LMP  (LMP Unknown)   BMI 52.31 kg/m   General appearance: alert, no distress, well developed, well nourished, African American female Neck: supple, no lymphadenopathy, no thyromegaly, no masses Heart: RRR, normal S1, S2, no murmurs Lungs: CTA bilaterally, no wheezes, rhonchi, or rales Pulses: 2+ radial pulses, 2+ pedal pulses, normal cap refill Ext: no edema Psych: Pleasant, good eye contact, answers questions appropriately     Assessment: Encounter Diagnoses  Name Primary?   Type 2 diabetes mellitus with diabetic nephropathy, without long-term current use of insulin (HCC) Yes   Chronic bronchitis, unspecified chronic bronchitis type ([place])    Chronic kidney disease, unspecified CKD stage    Diabetes mellitus type 2 in obese (HCC)    Needs flu shot    Vitamin D deficiency    Subclinical hypothyroidism    Primary hypertension    Hypertension associated with diabetes ([place])    Hyperlipidemia associated with type 2 diabetes mellitus (HCC)    Anemia, unspecified type    Statin intolerance      Plan: I reviewed her lengthy health history, we discussed her medications, intolerances, prior labs, prior screenings.  Diabetes Continue glucose monitoring Goal is 80-130 fasting blood sugars and hemoglobin A1c less than 7% For now let us stop Ozempic and glimepiride  as they probably are giving you problems such as fatigue nausea and low sugars Begin trial of Mounjaro low-dose weekly.  As long as you tolerate this we will gradually go up over the next month to the next dose Continue metformin twice a day Work to eat a healthy low sugar diet. Get exercise as tolerated   High Blood pressure Continue losartan HCT 100/12.5 mg daily  Hyperlipidemia Stop Livalo since you are not tolerating this or any other statin I would like to maybe have you try fenofibrate or gemfibrozil over the next month but last not worry about this for the time being so we can get your diabetes medicines figured out  Chronic bronchitis Begin albuterol inhaler 2 puffs twice daily.  Lets see if this helps  with some of her breathing issues If this works well we can consider adding a daily preventative inhaler that would be longer acting   Gout, elevated uric acid Continue allopurinol 100 mg daily for prevention   History of subclinical hypothyroidism Not currently on medication Updated labs today   Chronic kidney disease related to diabetes and high blood pressure Due to abnormal kidney function, and in order to protect your kidneys, I recommend you avoid medications that can harm the kidneys such as ibuprofen, Aleve, Advil, Motrin, Naprosyn, or prescription anti-inflammatories which are used for pain, inflammation, and arthritis.   You should avoid dehydration which can harm the kidneys. Drink at least 80 to 100 ounces of water daily   History of anemia Going for you may need to do a follow-up with gastroenterology to rule out other causes of anemia history of blood counts are going worse I am checking your blood counts and iron today   [place] was seen today for med check.  Diagnoses and all orders for this visit:  Type 2 diabetes mellitus with diabetic nephropathy, without long-term current use of insulin (HCC) -     Hemoglobin A1c -     Microalbumin/Creatinine Ratio,  Urine  Chronic bronchitis, unspecified chronic bronchitis type (HCC)  Chronic kidney disease, unspecified CKD stage  Diabetes mellitus type 2 in obese (HCC)  Needs flu shot -     Flu Vaccine QUAD High Dose(Fluad)  Vitamin D deficiency -     VITAMIN D 25 Hydroxy (Vit-D Deficiency, Fractures)  Subclinical hypothyroidism -     TSH + free T4  Primary hypertension  Hypertension associated with diabetes ([place])  Hyperlipidemia associated with type 2 diabetes mellitus (HCC)  Anemia, unspecified type -     Iron -     CBC  Statin intolerance  Other orders -     tirzepatide (MOUNJARO) 2.5 MG/0.5ML Pen; Inject 2.5 mg into the skin once a week. -     albuterol (VENTOLIN HFA) 108 (90 Base) MCG/ACT inhaler; Inhale 2 puffs into the lungs every 6 (six) hours as needed for wheezing or shortness of breath.  Spent > 45 minutes face to face with patient in discussion of symptoms, evaluation, plan and recommendations.    Follow up: pending labs

## 2022-08-29 ENCOUNTER — Telehealth: Payer: Self-pay | Admitting: Physician Assistant

## 2022-08-29 LAB — TSH+FREE T4
Free T4: 1.24 ng/dL (ref 0.82–1.77)
TSH: 2.65 u[IU]/mL (ref 0.450–4.500)

## 2022-08-29 LAB — CBC
Hematocrit: 33.8 % — ABNORMAL LOW (ref 34.0–46.6)
Hemoglobin: 10.8 g/dL — ABNORMAL LOW (ref 11.1–15.9)
MCH: 28.9 pg (ref 26.6–33.0)
MCHC: 32 g/dL (ref 31.5–35.7)
MCV: 90 fL (ref 79–97)
Platelets: 276 10*3/uL (ref 150–450)
RBC: 3.74 x10E6/uL — ABNORMAL LOW (ref 3.77–5.28)
RDW: 13.7 % (ref 11.7–15.4)
WBC: 6.8 10*3/uL (ref 3.4–10.8)

## 2022-08-29 LAB — IRON: Iron: 31 ug/dL (ref 27–139)

## 2022-08-29 LAB — HEMOGLOBIN A1C
Est. average glucose Bld gHb Est-mCnc: 157 mg/dL
Hgb A1c MFr Bld: 7.1 % — ABNORMAL HIGH (ref 4.8–5.6)

## 2022-08-29 LAB — VITAMIN D 25 HYDROXY (VIT D DEFICIENCY, FRACTURES): Vit D, 25-Hydroxy: 27.8 ng/mL — ABNORMAL LOW (ref 30.0–100.0)

## 2022-08-29 LAB — MICROALBUMIN / CREATININE URINE RATIO
Creatinine, Urine: 100.2 mg/dL
Microalb/Creat Ratio: 24 mg/g creat (ref 0–29)
Microalbumin, Urine: 23.8 ug/mL

## 2022-08-29 NOTE — Telephone Encounter (Signed)
Pt called states albuterol not covered.  Called pharmacy Ventolin not covered,  I was able to get albuterol to go thru for $0 co pay when I switched to Proair generic albuterol. Pt informed.  Also checked allopurinol also went thru for $0  co pay.

## 2022-09-01 ENCOUNTER — Telehealth: Payer: Self-pay | Admitting: Physician Assistant

## 2022-09-01 ENCOUNTER — Other Ambulatory Visit: Payer: Self-pay | Admitting: Medical

## 2022-09-01 MED ORDER — VITAMIN D (ERGOCALCIFEROL) 1.25 MG (50000 UNIT) PO CAPS: 50000.0000 [IU] | ORAL_CAPSULE | ORAL | 0 refills | Status: DC

## 2022-09-01 NOTE — Telephone Encounter (Signed)
Pt states her Vit D needs prior auth

## 2022-09-02 NOTE — Telephone Encounter (Signed)
P.A. VITAMIN D completed

## 2022-09-05 NOTE — Telephone Encounter (Unsigned)
Records faxed for P.A.

## 2022-09-07 NOTE — Telephone Encounter (Signed)
P.A. Drisdol denied Medicare will not pay for any vitamins or supplements.  Checked for cheapest prices and [facility] has generic Drisdol #30 for $8 + $5 shipping= $13.  Called pt and she would like Rx sent there for #30.  Please escribe #[phone]

## 2022-09-19 NOTE — Telephone Encounter (Signed)
Pt called back that she set up the account with [facility] but [facility] Vitamin D hasn't been sent in yet.  Shane Please escribe #[phone] to [facility], her cost will only be $13 total

## 2022-09-20 ENCOUNTER — Other Ambulatory Visit: Payer: Self-pay | Admitting: Medical

## 2022-09-20 MED ORDER — VITAMIN D (ERGOCALCIFEROL) 1.25 MG (50000 UNIT) PO CAPS: 50000.0000 [IU] | ORAL_CAPSULE | ORAL | 0 refills | Status: DC

## 2022-09-26 ENCOUNTER — Encounter: Payer: Self-pay | Admitting: Internal Medicine

## 2022-09-26 NOTE — Telephone Encounter (Signed)
Called pt and she still hasn't gotten the Rx, gave her the T# to [place] she will call them and call back if she has any other issues

## 2022-10-03 ENCOUNTER — Ambulatory Visit (INDEPENDENT_AMBULATORY_CARE_PROVIDER_SITE_OTHER): Payer: Medicare Other | Admitting: Medical

## 2022-10-03 VITALS — BP 130/70 | HR 103 | Wt 331.2 lb

## 2022-10-03 DIAGNOSIS — E785 Hyperlipidemia, unspecified: Secondary | ICD-10-CM | POA: Diagnosis not present

## 2022-10-03 DIAGNOSIS — E1169 Type 2 diabetes mellitus with other specified complication: Secondary | ICD-10-CM | POA: Diagnosis not present

## 2022-10-03 DIAGNOSIS — E1159 Type 2 diabetes mellitus with other circulatory complications: Secondary | ICD-10-CM | POA: Diagnosis not present

## 2022-10-03 DIAGNOSIS — E559 Vitamin D deficiency, unspecified: Secondary | ICD-10-CM

## 2022-10-03 DIAGNOSIS — E669 Obesity, unspecified: Secondary | ICD-10-CM

## 2022-10-03 DIAGNOSIS — N189 Chronic kidney disease, unspecified: Secondary | ICD-10-CM | POA: Diagnosis not present

## 2022-10-03 DIAGNOSIS — W19XXXA Unspecified fall, initial encounter: Secondary | ICD-10-CM

## 2022-10-03 DIAGNOSIS — I152 Hypertension secondary to endocrine disorders: Secondary | ICD-10-CM | POA: Diagnosis not present

## 2022-10-03 DIAGNOSIS — D649 Anemia, unspecified: Secondary | ICD-10-CM

## 2022-10-03 MED ORDER — FENOFIBRATE 145 MG PO TABS: 145.0000 mg | ORAL_TABLET | Freq: Every day | ORAL | 3 refills | Status: DC

## 2022-10-03 MED ORDER — TIRZEPATIDE 5 MG/0.5ML ~~LOC~~ SOAJ: 5.0000 mg | SUBCUTANEOUS | 0 refills | Status: DC

## 2022-10-03 MED ORDER — TIRZEPATIDE 7.5 MG/0.5ML ~~LOC~~ SOAJ: 7.5000 mg | SUBCUTANEOUS | 2 refills | Status: DC

## 2022-10-03 NOTE — Patient Instructions (Signed)
  Diabetes Continue glucose monitoring Goal is 80-130 fasting blood sugars and hemoglobin A1c less than 7% Increase dose of Mounjaro to '5mg'$  weekly.  After 1 week, increase to 7.'5mg'$  dose.  Continue metformin twice a day Work to eat a healthy low sugar diet. Get exercise as tolerated   Hyperlipidemia Begin trial of Fenofibrate once daily to help lower lipids  Recommendations for improving lipids:  Foods TO AVOID or limit - fried foods, high sugar foods, white bread, enriched flour, fast food, red meat, large amounts of cheese, processed foods such as little debbie cakes, cookies, pies, donuts, for example  Foods TO INCLUDE in the diet - whole grains such as whole grain pasta, whole grain bread, barley, steel cut oatmeal (not instant oatmeal), avocado, fish, green leafy vegetables, nuts, increased fiber in diet, and using olive oil in small amounts for cooking or as salad dressing vinaigrette.     Chronic bronchitis Continue albuterol as this is working for you If you start having to use albuterol more than 3 nights per week or more than 4 days per week, then we need to ADD a daily prevention inhaler such as Symbicort, Advair or other   History of anemia Continue the multivitamin with iron that you recently started due to anemia If you notice blood in stool, let me know Currently your next coloscopy is planned for 2025   Vitamin D deficiency Continue the recent vitamin D supplement we started.  We can recheck this in 3 month

## 2022-10-03 NOTE — Progress Notes (Signed)
Subjective:  [name] is a 71 y.o. female who presents for Chief Complaint  Patient presents with   4-6 week follow-up    4-6 follow-up on mounjaro. Doing well so far no issues     Here for med check.  She has multiple medical problems including diabetes, hypertension, hyperlipidemia, obesity, chronic bronchitis, GERD, chronic kidney disease, gout, vitamin D deficiency, statin intolerance, and anemia.  I saw her 08/28/22 for med check.   At that visit we made some changes to her regimen and checked some labs.    She has made the changes we discussed last time.  Diabetes-last visit we stopped glimepiride and Ozempic due to side effect and low sugars.  She is tolerating Mounjaro and metformin just fine.  She was on metformin already.  She continues to monitor sugars which have been looking fine.  Chronic bronchitis-she is using albuterol 2 to 3 days/week and does see benefit compared to last visit's concerns.  She does not necessarily want to add a daily preventative inhaler.  She is a non-smoker.  Last visit her labs showed vitamin D deficiency.  She is compliant with the vitamin D supplements sent to the pharmacy  History of iron deficiency anemia-she did start over-the-counter multivitamin with iron.  She has chronic iron deficiency anemia.  No current concerns for bleeding or bruising.  She has a history of statin intolerance.  She is ready to start back on a different medicine to help with lipids  She had a recent fall.  She was coming out of [employer] and ended up tripping and falling on her left side.  She says she was sore for couple days but is doing fine now.  She did not have a head injury.  She did not have any significant injury.  She has no complaint today.  No other aggravating or relieving factors.    No other c/o.  Past Medical History:  Diagnosis Date   Abdominal hernia    Acute blood loss as cause of postoperative anemia 02/16/2017   Arthritis     knees, back, R ankle    Bilateral carpal tunnel syndrome 04/29/2012   Diabetes mellitus    Diabetes mellitus type 2 in obese ([place]) 12/31/2015   Edema    legs   GERD (gastroesophageal reflux disease)    HOH (hard of hearing)    Hypertension    Hypertriglyceridemia 08/30/2015   Started on medication 08/30/15   Lipoma 09/30/2012   Obesity    morbid obesity   OBESITY, MORBID 10/16/2007   Qualifier: Diagnosis of  By: Enrique Sack     Osteoarthrosis involving lower leg 10/16/2007   Qualifier: Diagnosis of  By: Enrique Sack     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 18 g 1   allopurinol (ZYLOPRIM) 100 MG tablet Take 1 tablet (100 mg total) by mouth daily. TAKE 1 TABLET(100 MG) BY MOUTH DAILY Strength: 100 mg 90 tablet 3   Cyanocobalamin (VITAMIN B-12 PO) Take 1 tablet by mouth daily at 2 PM.     glucose blood (ACCU-CHEK  GUIDE) test strip TEST 1 TO 2 TIMES DAILY 200 strip 1   losartan-hydrochlorothiazide (HYZAAR) 100-12.5 MG tablet Take 1 tablet by mouth daily. 90 tablet 3   MAGNESIUM CITRATE PO Take 1 tablet by mouth daily as needed.     meclizine (ANTIVERT) 25 MG tablet Take 1 tablet (25 mg total) by mouth 3 (three) times daily as needed for dizziness. 21 tablet 0   metFORMIN (GLUCOPHAGE) 1000 MG tablet Take 1 tablet (1,000 mg total) by mouth 2 (two) times daily with a meal. 180 tablet 1   Multiple Vitamin (MULTIVITAMIN) tablet Take 1 tablet by mouth daily.     tirzepatide [facility]) 2.5 MG/0.5ML Pen Inject 2.5 mg into the skin once a week. 2 mL 1   Vitamin D, Ergocalciferol,  (DRISDOL) 1.25 MG (50000 UNIT) CAPS capsule Take 1 capsule (50,000 Units total) by mouth every 7 (seven) days. 30 capsule 0   Accu-Chek Softclix Lancets lancets TEST 1 TO 2 TIMES DAILY 100 each 1   Blood Glucose Monitoring Suppl (SOLUS V2 BLOOD GLUCOSE SYSTEM) DEVI Test 1-2 times a day Dx E11.9 1 each 0   No current facility-administered medications on file prior to visit.   The following portions of the patient's history were reviewed and updated as appropriate: allergies, current medications, past family history, past medical history, past social history, past surgical history and problem list.  ROS Otherwise as in subjective above    Objective: BP 130/70   Pulse (!) 103   Wt (!) 331 lb 3.2 oz (150.2 kg)   LMP  (LMP Unknown)   BMI 51.87 kg/m   General appearance: alert, no distress, well developed, well nourished, African American female Psych: Pleasant, good eye contact, answers questions appropriately    Assessment: Encounter Diagnoses  Name Primary?   Diabetes mellitus type 2 in obese ([place]) Yes   Hyperlipidemia associated with type 2 diabetes mellitus ([place])    Vitamin D deficiency    Hypertension associated with diabetes ([place])    Chronic kidney disease, unspecified CKD stage    Anemia, unspecified type    Fall, initial encounter      Plan: I reviewed her lengthy health history, we discussed her medications, intolerances, prior labs, prior screenings.  Diabetes Continue glucose monitoring Goal is 80-130 fasting blood sugars and hemoglobin A1c less than 7% Increase dose of Mounjaro to '5mg'$  weekly.  After 1  month, increase to 7.'5mg'$  dose.  Continue metformin twice a day Work to eat a healthy low sugar diet. Get exercise as tolerated   Hyperlipidemia Begin trial of Fenofibrate once daily to help lower lipids  Recommendations for improving lipids:  Foods TO AVOID or limit - fried foods, high sugar foods, white bread, enriched flour, fast food, red meat, large  amounts of cheese, processed foods such as little debbie cakes, cookies, pies, donuts, for example  Foods TO INCLUDE in the diet - whole grains such as whole grain pasta, whole grain bread, barley, steel cut oatmeal (not instant oatmeal), avocado, fish, green leafy vegetables, nuts, increased fiber in diet, and using olive oil in small amounts for cooking or as salad dressing vinaigrette.     Chronic bronchitis Continue albuterol as this is working for you If you start having to use albuterol more than 3 nights per week or more than 4 days per week, then we need to ADD a daily prevention inhaler such as Symbicort, Advair or other   History of anemia Continue the multivitamin with iron that you recently started  due to anemia If you notice blood in stool, let me know Currently your next coloscopy is planned for 2025   Vitamin D deficiency Continue the recent vitamin D supplement we started.  We can recheck this in 3 month   Fall - fortunately no ongoing issues.  Watch out for hazards, avoid future falls.   Next visit we will plan to recheck diabetes markers, anemia markers, vitamin D, liver and kidney   Rosene was seen today for 4-6 week follow-up.  Diagnoses and all orders for this visit:  Diabetes mellitus type 2 in obese ([place])  Hyperlipidemia associated with type 2 diabetes mellitus ([place])  Vitamin D deficiency  Hypertension associated with diabetes ([place])  Chronic kidney disease, unspecified CKD stage  Anemia, unspecified type  Fall, initial encounter  Other orders -     tirzepatide [facility]) 5 MG/0.5ML Pen; Inject 5 mg into the skin once a week. -     tirzepatide (MOUNJARO) 7.5 MG/0.5ML Pen; Inject 7.5 mg into the skin once a week. -     fenofibrate (TRICOR) 145 MG tablet; Take 1 tablet (145 mg total) by mouth daily.   Follow up: 3-20mo

## 2022-10-11 ENCOUNTER — Other Ambulatory Visit: Payer: Self-pay | Admitting: Medical

## 2022-10-12 ENCOUNTER — Other Ambulatory Visit: Payer: Self-pay | Admitting: Medical

## 2022-10-12 DIAGNOSIS — Z1231 Encounter for screening mammogram for malignant neoplasm of breast: Secondary | ICD-10-CM

## 2022-10-23 ENCOUNTER — Telehealth: Payer: Self-pay | Admitting: Internal Medicine

## 2022-10-23 NOTE — Telephone Encounter (Signed)
I received an order for diabetic shoes from [facility] in [facility].   Did she request diabetic shoes?

## 2022-10-23 NOTE — Telephone Encounter (Signed)
Spoke to patient and she has requested this due to swelling and history of gout in feet and would also like a back brace due to lower back pain

## 2022-10-23 NOTE — Telephone Encounter (Signed)
I received an order for diabetic shoes from [facility] in [facility].   Did she request diabetic shoes?

## 2022-10-23 NOTE — Telephone Encounter (Signed)
Left detailed message for pt to call back to schedule a visit with shane as she needs to do an exam for foot needs to meet qualifying conditions per insurance and do an exa, for need for back brace

## 2022-11-03 NOTE — Telephone Encounter (Signed)
Pt has denied an appointment for this and doesn't want to come in as she has already been seen for this multiple times in the past and she doesn't want another bill

## 2022-11-15 ENCOUNTER — Other Ambulatory Visit: Payer: Self-pay | Admitting: Medical

## 2022-11-21 ENCOUNTER — Other Ambulatory Visit: Payer: Self-pay | Admitting: Family Medicine

## 2022-11-21 DIAGNOSIS — E790 Hyperuricemia without signs of inflammatory arthritis and tophaceous disease: Secondary | ICD-10-CM

## 2022-12-05 ENCOUNTER — Ambulatory Visit
Admission: RE | Admit: 2022-12-05 | Discharge: 2022-12-05 | Disposition: A | Discharge status: Home / Self Care | Payer: Medicare Other | Source: Ambulatory Visit | Attending: Medical | Admitting: Medical

## 2022-12-05 DIAGNOSIS — Z1231 Encounter for screening mammogram for malignant neoplasm of breast: Secondary | ICD-10-CM

## 2022-12-06 NOTE — Progress Notes (Signed)
Results sent through MyChart

## 2022-12-12 DIAGNOSIS — H5203 Hypermetropia, bilateral: Secondary | ICD-10-CM | POA: Diagnosis not present

## 2022-12-12 DIAGNOSIS — H2513 Age-related nuclear cataract, bilateral: Secondary | ICD-10-CM | POA: Diagnosis not present

## 2022-12-12 DIAGNOSIS — H524 Presbyopia: Secondary | ICD-10-CM | POA: Diagnosis not present

## 2022-12-12 DIAGNOSIS — E119 Type 2 diabetes mellitus without complications: Secondary | ICD-10-CM | POA: Diagnosis not present

## 2022-12-12 DIAGNOSIS — H52223 Regular astigmatism, bilateral: Secondary | ICD-10-CM | POA: Diagnosis not present

## 2022-12-12 LAB — HM DIABETES EYE EXAM

## 2022-12-31 ENCOUNTER — Other Ambulatory Visit: Payer: Self-pay | Admitting: Physician Assistant

## 2023-01-13 ENCOUNTER — Other Ambulatory Visit: Payer: Self-pay | Admitting: Physician Assistant

## 2023-01-13 DIAGNOSIS — I10 Essential (primary) hypertension: Secondary | ICD-10-CM

## 2023-02-14 ENCOUNTER — Telehealth: Payer: Self-pay | Admitting: Medical

## 2023-02-14 NOTE — Telephone Encounter (Signed)
Contacted [name] to schedule their annual wellness visit. Appointment made for 02/27/23.  [name] AWV direct phone # [phone]

## 2023-02-27 ENCOUNTER — Ambulatory Visit (INDEPENDENT_AMBULATORY_CARE_PROVIDER_SITE_OTHER): Payer: 59

## 2023-02-27 VITALS — Ht 67.0 in | Wt 318.0 lb

## 2023-02-27 DIAGNOSIS — Z0000 Encounter for general adult medical examination without abnormal findings: Secondary | ICD-10-CM | POA: Diagnosis not present

## 2023-02-27 NOTE — Progress Notes (Signed)
I connected with  [name] on 02/27/23 by a audio enabled telemedicine application and verified that I am speaking with the correct person using two identifiers.  Patient Location: Home  Provider Location: Office/Clinic  I discussed the limitations of evaluation and management by telemedicine. The patient expressed understanding and agreed to proceed.  Subjective:   [name] is a 72 y.o. female who presents for Medicare Annual (Subsequent) preventive examination.  Review of Systems     Cardiac Risk Factors include: advanced age (>24mn, >>2women);diabetes mellitus;dyslipidemia;hypertension;obesity (BMI >30kg/m2)     Objective:    Today's Vitals   02/27/23 1126  Weight: (!) 318 lb (144.2 kg)  Height: '5\' 7"'$  (1.702 m)   Body mass index is 49.81 kg/m.     02/27/2023   11:31 AM 02/16/2021    2:54 PM 01/16/2019    3:05 PM 09/11/2017   10:21 AM 08/27/2017   10:03 AM 08/22/2017    9:04 AM 08/15/2017    3:40 PM  Advanced Directives  Does Patient Have a Medical Advance Directive? Yes Yes Yes No No Yes Yes  Type of Advance Directive Out of facility DNR (pink MOST or yellow form) [place] will Living will   Living will Living will  Does patient want to make changes to medical advance directive?      No - Patient declined No - Patient declined  Copy of [place]?  Yes - validated most recent copy scanned in chart (See row information)         Current Medications (verified) Outpatient Encounter Medications as of 02/27/2023  Medication Sig   Accu-Chek Softclix Lancets lancets TEST 1 TO 2 TIMES DAILY   albuterol (VENTOLIN HFA) 108 (90 Base) MCG/ACT inhaler INHALE 2 PUFFS INTO THE LUNGS EVERY 6 HOURS AS NEEDED FOR WHEEZING OR SHORTNESS OF BREATH   allopurinol (ZYLOPRIM) 100 MG tablet TAKE 1 TABLET BY MOUTH DAILY   Blood Glucose Monitoring Suppl (SOLUS V2 BLOOD GLUCOSE SYSTEM) DEVI Test 1-2 times a day Dx E11.9    Cyanocobalamin (VITAMIN B-12 PO) Take 1 tablet by mouth daily at 2 PM.   fenofibrate (TRICOR) 145 MG tablet Take 1 tablet (145 mg total) by mouth daily.   glucose blood (ACCU-CHEK GUIDE) test strip TEST 1 TO 2 TIMES DAILY   losartan-hydrochlorothiazide (HYZAAR) 100-12.5 MG tablet TAKE 1 TABLET BY MOUTH DAILY   MAGNESIUM CITRATE PO Take 1 tablet by mouth daily as needed.   meclizine (ANTIVERT) 25 MG tablet Take 1 tablet (25 mg total) by mouth 3 (three) times daily as needed for dizziness.   metFORMIN (GLUCOPHAGE) 1000 MG tablet TAKE 1 TABLET BY MOUTH TWICE  DAILY WITH MEALS   Multiple Vitamin (MULTIVITAMIN) tablet Take 1 tablet by mouth daily.   tirzepatide (MOUNJARO) 7.5 MG/0.5ML Pen Inject 7.5 mg into the skin once a week.   Vitamin D, Ergocalciferol, (DRISDOL) 1.25 MG (50000 UNIT) CAPS capsule Take 1 capsule (50,000 Units total) by mouth every 7 (seven) days.   tirzepatide ([facility] 2.5 MG/0.5ML Pen Inject 2.5 mg into the skin once a week. (Patient not taking: Reported on 02/27/2023)   tirzepatide ([facility] 5 MG/0.5ML Pen Inject 5 mg into the skin once a week. (Patient not taking: Reported on 02/27/2023)   No facility-administered encounter medications on file as of 02/27/2023.    Allergies (verified) Crestor [rosuvastatin], Other, and Sulfa antibiotics   History: Past Medical History:  Diagnosis Date   Abdominal hernia  Acute blood loss as cause of postoperative anemia 02/16/2017   Arthritis    knees, back, R ankle    Bilateral carpal tunnel syndrome 04/29/2012   Diabetes mellitus    Diabetes mellitus type 2 in obese ([place]) 12/31/2015   Edema    legs   GERD (gastroesophageal reflux disease)    HOH (hard of hearing)    Hypertension    Hypertriglyceridemia 08/30/2015   Started on medication 08/30/15   Lipoma 09/30/2012   Obesity    morbid obesity   OBESITY, MORBID 10/16/2007   Qualifier: Diagnosis of  By: Enrique Sack     Osteoarthrosis involving lower leg 10/16/2007    Qualifier: Diagnosis of  By: Enrique Sack     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   Status post total right knee replacement 08/21/2017   Subclinical hypothyroidism 10/11/2016   Tachycardia 08/22/2011   Vertigo    Vitamin D deficiency 02/16/2017   Past Surgical History:  Procedure Laterality Date   [place]?   right   CARPAL TUNNEL RELEASE Left 07/10/2014   Procedure: LEFT ENDOSCOPIC CARPAL TUNNEL RELEASE ;  Surgeon: Jolyn Nap, MD;  Location: [place];  Service: Orthopedics;  Laterality: Left;   CARPAL TUNNEL RELEASE Right    COLONOSCOPY     HERNIA REPAIR  07/03/2011   abd hernia   NOSE SURGERY  2008   sinus   TOTAL KNEE ARTHROPLASTY Left 02/06/2017   Procedure: LEFT TOTAL KNEE ARTHROPLASTY;  Surgeon: Mcarthur Rossetti, MD;  Location: [place];  Service: Orthopedics;  Laterality: Left;   TOTAL KNEE ARTHROPLASTY Right 08/21/2017   TOTAL KNEE ARTHROPLASTY Right 08/21/2017   Procedure: RIGHT TOTAL KNEE ARTHROPLASTY;  Surgeon: Mcarthur Rossetti, MD;  Location: [place];  Service: Orthopedics;  Laterality: Right;   TUBAL LIGATION     Family History  Problem Relation Age of Onset   Hypertension Mother    Kidney disease Mother    Diabetes Father    Hypertension Father    Diabetes Sister    Hypertension Sister    Hypertension Sister    Diabetes Brother    Hypertension Brother    Heart disease Brother 53       heart attack   Colon cancer Neg Hx    Esophageal cancer Neg Hx    Colon polyps Neg Hx    Pancreatic cancer Neg Hx    Rectal cancer Neg Hx    Stomach cancer Neg Hx    Social History   Socioeconomic History   Marital status: Single    Spouse name: Not on file   Number of children: 2   Years of education: Not on file   Highest education level: Not on file  Occupational History   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  Tobacco Use   Smoking status: Never   Smokeless tobacco: Never  Vaping  Use   Vaping Use: Never used  Substance and Sexual Activity   Alcohol use: Yes    Comment: occ wine   Drug use: No   Sexual activity: Never  Other Topics Concern   Not on file  Social History Narrative   Admitted to [facility] 08/24/17   Divorced   Jehovah Witness    Never smoked   Alcohol none   Full Code   Right handed    Some caffeine in take    Social Determinants of Radio broadcast assistant  Strain: Low Risk  (02/27/2023)   Overall Financial Resource Strain (CARDIA)    Difficulty of Paying Living Expenses: Not hard at all  Food Insecurity: No Food Insecurity (02/27/2023)   Hunger Vital Sign    Worried About Running Out of Food in the Last Year: Never true    Ran Out of Food in the Last Year: Never true  Transportation Needs: No Transportation Needs (02/27/2023)   PRAPARE - Hydrologist (Medical): No    Lack of Transportation (Non-Medical): No  Physical Activity: Inactive (02/27/2023)   Exercise Vital Sign    Days of Exercise per Week: 0 days    Minutes of Exercise per Session: 0 min  Stress: No Stress Concern Present (02/27/2023)   [place]    Feeling of Stress : Not at all  Social Connections: Not on file    Tobacco Counseling Counseling given: Not Answered   Clinical Intake:  Pre-visit preparation completed: Yes  Pain : No/denies pain     Nutritional Status: BMI > 30  Obese Nutritional Risks: None Diabetes: Yes  How often do you need to have someone help you when you read instructions, pamphlets, or other written materials from your doctor or pharmacy?: 1 - Never  Diabetic? Yes Nutrition Risk Assessment:  Has the patient had any N/V/D within the last 2 months?  No  Does the patient have any non-healing wounds?  No  Has the patient had any unintentional weight loss or weight gain?  No   Diabetes:  Is the patient diabetic?  Yes  If  diabetic, was a CBG obtained today?  No  Did the patient bring in their glucometer from home?  No  How often do you monitor your CBG's? Twice weekly.   Financial Strains and Diabetes Management:  Are you having any financial strains with the device, your supplies or your medication? No .  Does the patient want to be seen by Chronic Care Management for management of their diabetes?  No  Would the patient like to be referred to a Nutritionist or for Diabetic Management?  No   Diabetic Exams:  Diabetic Eye Exam: Overdue for diabetic eye exam. Pt has been advised about the importance in completing this exam. Patient advised to call and schedule an eye exam. Diabetic Foot Exam: Overdue, Pt has been advised about the importance in completing this exam. Pt is scheduled for diabetic foot exam on next appointment.   Interpreter Needed?: No  Information entered by :: NAllen LPN   Activities of Daily Living    02/27/2023   11:33 AM  In your present state of health, do you have any difficulty performing the following activities:  Hearing? 1  Comment decreased hearing in left ear  Vision? 0  Difficulty concentrating or making decisions? 0  Walking or climbing stairs? 1  Dressing or bathing? 0  Doing errands, shopping? 0  Preparing Food and eating ? N  Using the Toilet? N  In the past six months, have you accidently leaked urine? Y  Do you have problems with loss of bowel control? N  Managing your Medications? N  Managing your Finances? N  Housekeeping or managing your Housekeeping? N    Patient Care Team: Tysinger, Camelia Eng, PA-C as PCP - General (Family Medicine)  Indicate any recent Medical Services you may have received from other than [facility] providers in the past year (date may be approximate).  Assessment:   This is a routine wellness examination for [name].  Hearing/Vision screen Vision Screening - Comments:: Regular eye exams, [facility]  Dietary issues and exercise  activities discussed: Current Exercise Habits: The patient does not participate in regular exercise at present   Goals Addressed             This Visit's Progress    Patient Stated       02/27/2023, wants to lose weight       Depression Screen    02/27/2023   11:33 AM 08/28/2022    3:09 PM 02/20/2022    2:42 PM 02/16/2021    2:55 PM 06/30/2020    2:42 PM 01/01/2020    2:00 PM 10/28/2018    2:49 PM  PHQ 2/9 Scores  PHQ - 2 Score 0 0 0 0 0 0 0    Fall Risk    02/27/2023   11:32 AM 08/28/2022    3:09 PM 02/20/2022    2:42 PM 02/16/2021    2:54 PM 06/30/2020    2:42 PM  Fall Risk   Falls in the past year? 1 0 0 0 0  Comment stepped off curb      Number falls in past yr: 0 0 0 0 0  Injury with Fall? 0 0 0 0 0  Risk for fall due to : Medication side effect;Impaired mobility No Fall Risks No Fall Risks No Fall Risks   Follow up Falls prevention discussed;Education provided;Falls evaluation completed Falls evaluation completed Falls evaluation completed Falls evaluation completed     FALL RISK PREVENTION PERTAINING TO THE HOME:  Any stairs in or around the home? No  If so, are there any without handrails? N/a Home free of loose throw rugs in walkways, pet beds, electrical cords, etc? Yes  Adequate lighting in your home to reduce risk of falls? Yes   ASSISTIVE DEVICES UTILIZED TO PREVENT FALLS:  Life alert? No  Use of a cane, walker or w/c? No  Grab bars in the bathroom? Yes  Shower chair or bench in shower? Yes  Elevated toilet seat or a handicapped toilet? No   TIMED UP AND GO:  Was the test performed? No .      Cognitive Function:        02/27/2023   11:36 AM  6CIT Screen  What Year? 0 points  What month? 0 points  What time? 0 points  Count back from 20 0 points  Months in reverse 0 points  Repeat phrase 0 points  Total Score 0 points    Immunizations Immunization History  Administered Date(s) Administered   Fluad Quad(high Dose 65+) 09/10/2019,  10/06/2020, 08/24/2021, 08/28/2022   Influenza, High Dose Seasonal PF 10/28/2018   Influenza,inj,Quad PF,6+ Mos 09/21/2016, 10/03/2017   Moderna Sars-Covid-2 Vaccination 05/25/2020, 06/22/2020, 12/29/2020, 09/09/2021   PPD Test 02/09/2017   Pneumococcal Conjugate-13 10/28/2018   Pneumococcal Polysaccharide-23 08/01/2006, 01/01/2020   RSV,unspecified 11/14/2022   Td 12/21/2003   Tdap 09/29/2015   Unspecified SARS-COV-2 Vaccination 11/14/2022   Zoster Recombinat (Shingrix) 08/19/2021    TDAP status: Due, Education has been provided regarding the importance of this vaccine. Advised may receive this vaccine at local pharmacy or Health Dept. Aware to provide a copy of the vaccination record if obtained from local pharmacy or Health Dept. Verbalized acceptance and understanding.  Flu Vaccine status: Up to date  Pneumococcal vaccine status: Up to date  Covid-19 vaccine status: Completed vaccines  Qualifies for Shingles Vaccine? Yes  Zostavax completed No   Shingrix Completed?: needs second dose  Screening Tests Health Maintenance  Topic Date Due   Zoster Vaccines- Shingrix (2 of 2) 10/14/2021   FOOT EXAM  02/16/2022   OPHTHALMOLOGY EXAM  11/01/2022   COVID-19 Vaccine (6 - 2023-24 season) 01/09/2023   HEMOGLOBIN A1C  02/26/2023   Diabetic kidney evaluation - eGFR measurement  04/28/2023   Diabetic kidney evaluation - Urine ACR  08/29/2023   Medicare Annual Wellness (AWV)  02/27/2024   COLONOSCOPY (Pts 45-91yr Insurance coverage will need to be confirmed)  03/06/2024   MAMMOGRAM  12/05/2024   DTaP/Tdap/Td (3 - Td or Tdap) 09/28/2025   Pneumonia Vaccine 70 Years old  Completed   INFLUENZA VACCINE  Completed   DEXA SCAN  Completed   Hepatitis C Screening  Completed   HPV VACCINES  Aged Out    Health Maintenance  Health Maintenance Due  Topic Date Due   Zoster Vaccines- Shingrix (2 of 2) 10/14/2021   FOOT EXAM  02/16/2022   OPHTHALMOLOGY EXAM  11/01/2022   COVID-19  Vaccine (6 - 2023-24 season) 01/09/2023   HEMOGLOBIN A1C  02/26/2023    Colorectal cancer screening: Type of screening: Colonoscopy. Completed 03/06/2014. Repeat every 10 years  Mammogram status: Completed 12/05/2022. Repeat every year  Bone Density status: Completed 12/27/2018.   Lung Cancer Screening: (Low Dose CT Chest recommended if Age 70-80years, 30 pack-year currently smoking OR have quit w/in 15years.) does not qualify.   Lung Cancer Screening Referral: no  Additional Screening:  Hepatitis C Screening: does qualify; Completed 10/28/2018  Vision Screening: Recommended annual ophthalmology exams for early detection of glaucoma and other disorders of the eye. Is the patient up to date with their annual eye exam?  Yes  Who is the provider or what is the name of the office in which the patient attends annual eye exams? [facility] If pt is not established with a provider, would they like to be referred to a provider to establish care? No .   Dental Screening: Recommended annual dental exams for proper oral hygiene  Community Resource Referral / Chronic Care Management: CRR required this visit?  No   CCM required this visit?  No      Plan:     I have personally reviewed and noted the following in the patient's chart:   Medical and social history Use of alcohol, tobacco or illicit drugs  Current medications and supplements including opioid prescriptions. Patient is not currently taking opioid prescriptions. Functional ability and status Nutritional status Physical activity Advanced directives List of other physicians Hospitalizations, surgeries, and ER visits in previous 12 months Vitals Screenings to include cognitive, depression, and falls Referrals and appointments  In addition, I have reviewed and discussed with patient certain preventive protocols, quality metrics, and best practice recommendations. A written personalized care plan for preventive services as  well as general preventive health recommendations were provided to patient.     NKellie Simmering LPN   [id number]  Nurse Notes: none  Due to this being a virtual visit, the after visit summary with patients personalized plan was offered to patient via mail or my-chart. Patient would like to access on my-chart

## 2023-02-27 NOTE — Patient Instructions (Addendum)
[name] , Thank you for taking time to come for your Medicare Wellness Visit. I appreciate your ongoing commitment to your health goals. Please review the following plan we discussed and let me know if I can assist you in the future.   These are the goals we discussed:  Goals      Patient Stated     02/27/2023, wants to lose weight        This is a list of the screening recommended for you and due dates:  Health Maintenance  Topic Date Due   Zoster (Shingles) Vaccine (2 of 2) 10/14/2021   Complete foot exam   02/16/2022   Eye exam for diabetics  11/01/2022   COVID-19 Vaccine (6 - 2023-24 season) 01/09/2023   Hemoglobin A1C  02/26/2023   Yearly kidney function blood test for diabetes  04/28/2023   Yearly kidney health urinalysis for diabetes  08/29/2023   Medicare Annual Wellness Visit  02/27/2024   Colon Cancer Screening  03/06/2024   Mammogram  12/05/2024   DTaP/Tdap/Td vaccine (3 - Td or Tdap) 09/28/2025   Pneumonia Vaccine  Completed   Flu Shot  Completed   DEXA scan (bone density measurement)  Completed   Hepatitis C Screening: USPSTF Recommendation to screen - Ages 44-79 yo.  Completed   HPV Vaccine  Aged Out    Advanced directives: copy in chart  Conditions/risks identified: none  Next appointment: Follow up in one year for your annual wellness visit    Preventive Care 65 Years and Older, Female Preventive care refers to lifestyle choices and visits with your health care provider that can promote health and wellness. What does preventive care include? A yearly physical exam. This is also called an annual well check. Dental exams once or twice a year. Routine eye exams. Ask your health care provider how often you should have your eyes checked. Personal lifestyle choices, including: Daily care of your teeth and gums. Regular physical activity. Eating a healthy diet. Avoiding tobacco and drug use. Limiting alcohol use. Practicing safe sex. Taking low-dose  aspirin every day. Taking vitamin and mineral supplements as recommended by your health care provider. What happens during an annual well check? The services and screenings done by your health care provider during your annual well check will depend on your age, overall health, lifestyle risk factors, and family history of disease. Counseling  Your health care provider may ask you questions about your: Alcohol use. Tobacco use. Drug use. Emotional well-being. Home and relationship well-being. Sexual activity. Eating habits. History of falls. Memory and ability to understand (cognition). Work and work Statistician. Reproductive health. Screening  You may have the following tests or measurements: Height, weight, and BMI. Blood pressure. Lipid and cholesterol levels. These may be checked every 5 years, or more frequently if you are over 31 years old. Skin check. Lung cancer screening. You may have this screening every year starting at age 55 if you have a 30-pack-year history of smoking and currently smoke or have quit within the past 15 years. Fecal occult blood test (FOBT) of the stool. You may have this test every year starting at age 109. Flexible sigmoidoscopy or colonoscopy. You may have a sigmoidoscopy every 5 years or a colonoscopy every 10 years starting at age 36. Hepatitis C blood test. Hepatitis B blood test. Sexually transmitted disease (STD) testing. Diabetes screening. This is done by checking your blood sugar (glucose) after you have not eaten for a while (fasting). You may have  this done every 1-3 years. Bone density scan. This is done to screen for osteoporosis. You may have this done starting at age 37. Mammogram. This may be done every 1-2 years. Talk to your health care provider about how often you should have regular mammograms. Talk with your health care provider about your test results, treatment options, and if necessary, the need for more tests. Vaccines  Your  health care provider may recommend certain vaccines, such as: Influenza vaccine. This is recommended every year. Tetanus, diphtheria, and acellular pertussis (Tdap, Td) vaccine. You may need a Td booster every 10 years. Zoster vaccine. You may need this after age 69. Pneumococcal 13-valent conjugate (PCV13) vaccine. One dose is recommended after age 7. Pneumococcal polysaccharide (PPSV23) vaccine. One dose is recommended after age 36. Talk to your health care provider about which screenings and vaccines you need and how often you need them. This information is not intended to replace advice given to you by your health care provider. Make sure you discuss any questions you have with your health care provider. Document Released: 12/31/2015 Document Revised: 08/23/2016 Document Reviewed: 10/05/2015 Elsevier Interactive Patient Education  2017 [place] Prevention in the Home Falls can cause injuries. They can happen to people of all ages. There are many things you can do to make your home safe and to help prevent falls. What can I do on the outside of my home? Regularly fix the edges of walkways and driveways and fix any cracks. Remove anything that might make you trip as you walk through a door, such as a raised step or threshold. Trim any bushes or trees on the path to your home. Use bright outdoor lighting. Clear any walking paths of anything that might make someone trip, such as rocks or tools. Regularly check to see if handrails are loose or broken. Make sure that both sides of any steps have handrails. Any raised decks and porches should have guardrails on the edges. Have any leaves, snow, or ice cleared regularly. Use sand or salt on walking paths during winter. Clean up any spills in your garage right away. This includes oil or grease spills. What can I do in the bathroom? Use night lights. Install grab bars by the toilet and in the tub and shower. Do not use towel bars as  grab bars. Use non-skid mats or decals in the tub or shower. If you need to sit down in the shower, use a plastic, non-slip stool. Keep the floor dry. Clean up any water that spills on the floor as soon as it happens. Remove soap buildup in the tub or shower regularly. Attach bath mats securely with double-sided non-slip rug tape. Do not have throw rugs and other things on the floor that can make you trip. What can I do in the bedroom? Use night lights. Make sure that you have a light by your bed that is easy to reach. Do not use any sheets or blankets that are too big for your bed. They should not hang down onto the floor. Have a firm chair that has side arms. You can use this for support while you get dressed. Do not have throw rugs and other things on the floor that can make you trip. What can I do in the kitchen? Clean up any spills right away. Avoid walking on wet floors. Keep items that you use a lot in easy-to-reach places. If you need to reach something above you, use a strong step stool  that has a grab bar. Keep electrical cords out of the way. Do not use floor polish or wax that makes floors slippery. If you must use wax, use non-skid floor wax. Do not have throw rugs and other things on the floor that can make you trip. What can I do with my stairs? Do not leave any items on the stairs. Make sure that there are handrails on both sides of the stairs and use them. Fix handrails that are broken or loose. Make sure that handrails are as long as the stairways. Check any carpeting to make sure that it is firmly attached to the stairs. Fix any carpet that is loose or worn. Avoid having throw rugs at the top or bottom of the stairs. If you do have throw rugs, attach them to the floor with carpet tape. Make sure that you have a light switch at the top of the stairs and the bottom of the stairs. If you do not have them, ask someone to add them for you. What else can I do to help prevent  falls? Wear shoes that: Do not have high heels. Have rubber bottoms. Are comfortable and fit you well. Are closed at the toe. Do not wear sandals. If you use a stepladder: Make sure that it is fully opened. Do not climb a closed stepladder. Make sure that both sides of the stepladder are locked into place. Ask someone to hold it for you, if possible. Clearly mark and make sure that you can see: Any grab bars or handrails. First and last steps. Where the edge of each step is. Use tools that help you move around (mobility aids) if they are needed. These include: Canes. Walkers. Scooters. Crutches. Turn on the lights when you go into a dark area. Replace any light bulbs as soon as they burn out. Set up your furniture so you have a clear path. Avoid moving your furniture around. If any of your floors are uneven, fix them. If there are any pets around you, be aware of where they are. Review your medicines with your doctor. Some medicines can make you feel dizzy. This can increase your chance of falling. Ask your doctor what other things that you can do to help prevent falls. This information is not intended to replace advice given to you by your health care provider. Make sure you discuss any questions you have with your health care provider. Document Released: 09/30/2009 Document Revised: 05/11/2016 Document Reviewed: 01/08/2015 Elsevier Interactive Patient Education  2017 [employer].

## 2023-03-13 ENCOUNTER — Ambulatory Visit (INDEPENDENT_AMBULATORY_CARE_PROVIDER_SITE_OTHER): Payer: 59 | Admitting: Family Medicine

## 2023-03-13 ENCOUNTER — Encounter: Payer: Self-pay | Admitting: Family Medicine

## 2023-03-13 ENCOUNTER — Ambulatory Visit
Admission: RE | Admit: 2023-03-13 | Discharge: 2023-03-13 | Disposition: A | Discharge status: Home / Self Care | Payer: 59 | Source: Ambulatory Visit | Attending: Family Medicine | Admitting: Family Medicine

## 2023-03-13 VITALS — BP 124/76 | HR 96 | Wt 318.6 lb

## 2023-03-13 DIAGNOSIS — M25512 Pain in left shoulder: Secondary | ICD-10-CM

## 2023-03-13 NOTE — Patient Instructions (Signed)
You can keep using the Biofreeze and also take 2 extra strength Tylenol 3 times per day.

## 2023-03-13 NOTE — Progress Notes (Signed)
   Subjective:    Patient ID: [name], female    DOB: 1951-12-02, 72 y.o.   MRN: [id number]  HPI She has a 2-week history of left shoulder pain.  No history of injury or overuse, numbness, tingling or weakness.  Abduction of the shoulder does cause some difficulty.  She has been using Biofreeze and occasional Tylenol.   Review of Systems     Objective:   Physical Exam Pain on motion in any direction.  Cannot do Neer's and Hawkins test due to pain.  No laxity noted.  No palpable tenderness except over the proximal deltoid.       Assessment & Plan:  Acute pain of left shoulder - Plan: DG Shoulder Left I explained that was difficult to do a good exam because of the pain that she was under.  Recommend 2 extra strength Tylenol 3 times per day.  Will get x-rays to determine the extent of any arthritic type complaints versus rotator cuff.

## 2023-03-15 NOTE — Addendum Note (Signed)
Addended by: Denita Lung on: 03/15/2023 02:48 PM   Modules accepted: Orders

## 2023-03-24 ENCOUNTER — Other Ambulatory Visit: Payer: Self-pay | Admitting: Medical

## 2023-03-24 DIAGNOSIS — I10 Essential (primary) hypertension: Secondary | ICD-10-CM

## 2023-04-07 ENCOUNTER — Other Ambulatory Visit: Payer: Self-pay | Admitting: Medical

## 2023-04-12 ENCOUNTER — Ambulatory Visit: Payer: 59 | Admitting: Orthopaedic Surgery

## 2023-04-20 ENCOUNTER — Ambulatory Visit (INDEPENDENT_AMBULATORY_CARE_PROVIDER_SITE_OTHER): Payer: 59 | Admitting: Medical

## 2023-04-20 VITALS — BP 118/68 | HR 108 | Temp 97.1°F | Wt 315.0 lb

## 2023-04-20 DIAGNOSIS — L309 Dermatitis, unspecified: Secondary | ICD-10-CM | POA: Diagnosis not present

## 2023-04-20 MED ORDER — TRIAMCINOLONE ACETONIDE 0.1 % EX CREA: 1.0000 | TOPICAL_CREAM | Freq: Two times a day (BID) | CUTANEOUS | 0 refills | Status: DC

## 2023-04-20 MED ORDER — HYDROXYZINE HCL 10 MG PO TABS: 10.0000 mg | ORAL_TABLET | Freq: Three times a day (TID) | ORAL | 0 refills | Status: DC | PRN

## 2023-04-20 NOTE — Progress Notes (Signed)
Subjective:  [name] is a 72 y.o. female who presents for Chief Complaint  Patient presents with   rash    Rash on chest and both arms. Rash has been there for 2 weeks and spreading     Here for rash. Started on neck, but has spread to further on neck and chest and arms, back of neck.  Feels under skin as well.   Has some scab or scaling.  Using OTC cortisone 10.   No new exposure, no recent poison ivy or yard work to get plant exposed.   Does have hx/o reaction to gold necklace in summer, but winter time it doesn't bother her.    Rash is sometimes itchy.  No other aggravating or relieving factors.    No other c/o.  Past Medical History:  Diagnosis Date   Abdominal hernia    Acute blood loss as cause of postoperative anemia 02/16/2017   Arthritis    knees, back, R ankle    Bilateral carpal tunnel syndrome 04/29/2012   Diabetes mellitus    Diabetes mellitus type 2 in obese (HCC) 12/31/2015   Edema    legs   GERD (gastroesophageal reflux disease)    HOH (hard of hearing)    Hypertension    Hypertriglyceridemia 08/30/2015   Started on medication 08/30/15   Lipoma 09/30/2012   Obesity    morbid obesity   OBESITY, MORBID 10/16/2007   Qualifier: Diagnosis of  By: Abby Potash     Osteoarthrosis involving lower leg 10/16/2007   Qualifier: Diagnosis of  By: Abby Potash     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      fenofibrate (TRICOR) 145 MG tablet Take 1 tablet (145 mg total) by mouth daily. 90 tablet 3   losartan-hydrochlorothiazide (HYZAAR) 100-12.5 MG tablet TAKE 1 TABLET BY MOUTH DAILY 100 tablet 0   MAGNESIUM CITRATE PO Take 1 tablet by mouth daily as needed.     metFORMIN (GLUCOPHAGE) 1000 MG tablet TAKE 1 TABLET BY MOUTH TWICE  DAILY WITH MEALS 180 tablet 0   Multiple Vitamin (MULTIVITAMIN) tablet Take 1 tablet by mouth daily.     tirzepatide (MOUNJARO) 7.5 MG/0.5ML Pen Inject 7.5 mg into the skin once a week. 6 mL 2   Vitamin D, Ergocalciferol, (DRISDOL) 1.25 MG (50000 UNIT) CAPS capsule Take 1 capsule (50,000 Units total) by mouth every 7 (seven) days. 30 capsule 0   Accu-Chek Softclix Lancets lancets TEST 1 TO 2 TIMES DAILY 100 each 1   Blood Glucose Monitoring Suppl (SOLUS V2 BLOOD GLUCOSE SYSTEM) DEVI Test 1-2 times a day Dx E11.9 1 each 0   glucose blood (ACCU-CHEK GUIDE) test strip TEST 1 TO 2 TIMES DAILY 200 strip 1   No current facility-administered medications on file prior to visit.    The following portions of the patient's history were reviewed and updated as appropriate:  allergies, current medications, past family history, past medical history, past social history, past surgical history and problem list.  ROS Otherwise as in subjective above    Objective: BP 118/68   Pulse (!) 108   Temp (!) 97.1 F (36.2 C)   Wt (!) 315 lb (142.9 kg)   LMP  (LMP Unknown)   BMI 49.34 kg/m   General appearance: alert, no distress, well developed, well nourished Skin: Scattered linear brownish markings on the neck circumferentially left to right, similar rash on the right posterior neck, a few small scattered pinkish-red round 2 to 3 mm nonspecific mostly flat rash on bilateral forearms No other rash noted    Assessment: Encounter Diagnosis  Name Primary?   Dermatitis Yes     Plan: Discussed possible causes of rash.  Seems to be a dermatitis, possibly allergic related,  possibly upon evaluate.  Rash is somewhat nonspecific and no recent known triggers.   Patient Instructions  Rash/dermatitis Rash is nonspecific, possible allergy related Rash doesn't appear to be viral or infectious Begin Triamcinolone cream twice daily on the rash except not the face.  You can use your Cortizone 10 for the face if needed, but use the new prescription cream for the other rash You may need to use the cream for up to 7-10 days If not much improved within 7-10 days, let me know and don't keep using the prescription cream without contacting me first Avoid hot showers for the next few days as this can aggravate rash Use the hydroxyzine 10mg  tablet either in the evening, or up to 3 times daily for itching and rash.  This may help the rash go away.   Take Hydroxyzine for at least the next 3 days If not much improved by early to mid next week, then let me know     [name] was seen today for rash.  Diagnoses and all orders for this visit:  Dermatitis  Other orders -     hydrOXYzine (ATARAX) 10 MG tablet; Take 1 tablet (10 mg total) by mouth 3 (three) times daily as needed. -     triamcinolone cream (KENALOG) 0.1 %; Apply 1 Application topically 2 (two) times daily.    Follow up: prn

## 2023-04-20 NOTE — Patient Instructions (Signed)
Rash/dermatitis Rash is nonspecific, possible allergy related Rash doesn't appear to be viral or infectious Begin Triamcinolone cream twice daily on the rash except not the face.  You can use your Cortizone 10 for the face if needed, but use the new prescription cream for the other rash You may need to use the cream for up to 7-10 days If not much improved within 7-10 days, let me know and don't keep using the prescription cream without contacting me first Avoid hot showers for the next few days as this can aggravate rash Use the hydroxyzine 10mg  tablet either in the evening, or up to 3 times daily for itching and rash.  This may help the rash go away.   Take Hydroxyzine for at least the next 3 days If not much improved by early to mid next week, then let me know

## 2023-04-30 ENCOUNTER — Other Ambulatory Visit: Payer: Self-pay | Admitting: Medical

## 2023-04-30 ENCOUNTER — Telehealth: Payer: Self-pay | Admitting: Medical

## 2023-04-30 DIAGNOSIS — L309 Dermatitis, unspecified: Secondary | ICD-10-CM

## 2023-04-30 MED ORDER — HYDROXYZINE HCL 10 MG PO TABS: 10.0000 mg | ORAL_TABLET | Freq: Three times a day (TID) | ORAL | 0 refills | Status: DC | PRN

## 2023-04-30 MED ORDER — VITAMIN D (ERGOCALCIFEROL) 1.25 MG (50000 UNIT) PO CAPS: 50000.0000 [IU] | ORAL_CAPSULE | ORAL | 0 refills | Status: DC

## 2023-04-30 MED ORDER — TRIAMCINOLONE ACETONIDE 0.1 % EX CREA: 1.0000 | TOPICAL_CREAM | Freq: Two times a day (BID) | CUTANEOUS | 0 refills | Status: DC

## 2023-04-30 NOTE — Telephone Encounter (Signed)
[facility] sent refill request for Vit D please send to the  [facility]) - [place], [place] - [address] [place]

## 2023-04-30 NOTE — Telephone Encounter (Signed)
Pt is not having any new symptoms but is out of hydroxyzine as of today and needs more cream.

## 2023-04-30 NOTE — Telephone Encounter (Signed)
Pt called and states that rash around her neck and her arms, and the rash on her arms feels like sandpaper and the cream has helped her neck just a little, she wants to know if she can be referred to a dermatology

## 2023-05-02 ENCOUNTER — Ambulatory Visit (INDEPENDENT_AMBULATORY_CARE_PROVIDER_SITE_OTHER): Payer: 59 | Admitting: Orthopaedic Surgery

## 2023-05-02 DIAGNOSIS — G8929 Other chronic pain: Secondary | ICD-10-CM | POA: Diagnosis not present

## 2023-05-02 DIAGNOSIS — M25512 Pain in left shoulder: Secondary | ICD-10-CM | POA: Diagnosis not present

## 2023-05-02 NOTE — Progress Notes (Signed)
The patient is somewhat upset in the past.  We have replaced both her knees.  Recently she saw Dr. Susann Givens her primary care physician did a significant acute right shoulder pain.  X-rays were obtained showing evidence of rotator cuff arthropathy of the left shoulder.  She says she is not hurting bad today but does know when there is weather changes coming and how that affects her shoulders.  She says her knee replacements have done great.  On exam today she is surprisingly shows some function of that left shoulder more than what I would expect.  She is able to abduct her shoulder as well as rotated and reach behind her.  It is certainly painful to do so and there is some weakness and grinding but otherwise it is functionally okay for her.  I did go over the x-rays with her of her left shoulder.  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  From a surgical standpoint, the only treatment for this would be a reverse shoulder replacement.  However she is really not having a lot of symptoms and so one option would be considering a steroid injection in her right shoulder subacromial outlet for joint if it starts bothering her again.  She will call us directly if she decides to have an injection in her shoulder.  She is diabetic and I did see her last hemoglobin A1c was 7.1 so I did talk to her about this.  All questions and concerns were answered and addressed.  Follow-up thus far is as needed.

## 2023-05-07 ENCOUNTER — Telehealth: Payer: Self-pay

## 2023-05-07 NOTE — Telephone Encounter (Signed)
Pt advised she is okay with Ozempic as she has tolerated this well in the past with minimum nausea. She was previously taking the 1mg .

## 2023-05-07 NOTE — Telephone Encounter (Signed)
Pt called to advise she has been unable to locate mounjaro 7.5mg . The pharmacy advised her to reach out to [place] to see if there was a medication that you'd like to use as a substitute.

## 2023-05-08 ENCOUNTER — Other Ambulatory Visit: Payer: Self-pay | Admitting: Medical

## 2023-05-08 MED ORDER — SEMAGLUTIDE(0.25 OR 0.5MG/DOS) 2 MG/3ML ~~LOC~~ SOPN
0.5000 mg | PEN_INJECTOR | SUBCUTANEOUS | 1 refills | Status: DC
Start: 2023-05-08 — End: 2023-08-14

## 2023-05-09 DIAGNOSIS — L298 Other pruritus: Secondary | ICD-10-CM | POA: Diagnosis not present

## 2023-05-09 DIAGNOSIS — L309 Dermatitis, unspecified: Secondary | ICD-10-CM | POA: Diagnosis not present

## 2023-05-09 DIAGNOSIS — L568 Other specified acute skin changes due to ultraviolet radiation: Secondary | ICD-10-CM | POA: Diagnosis not present

## 2023-05-09 DIAGNOSIS — L989 Disorder of the skin and subcutaneous tissue, unspecified: Secondary | ICD-10-CM | POA: Diagnosis not present

## 2023-05-22 ENCOUNTER — Other Ambulatory Visit: Payer: Self-pay | Admitting: Medical

## 2023-05-22 ENCOUNTER — Encounter: Payer: Self-pay | Admitting: *Deleted

## 2023-05-22 DIAGNOSIS — E790 Hyperuricemia without signs of inflammatory arthritis and tophaceous disease: Secondary | ICD-10-CM

## 2023-05-22 NOTE — Progress Notes (Signed)
[facility] Quality Team Note  Name: [name] Date of Birth: 1951/06/10 MRN: [id number] Date: 05/22/2023  [facility] Quality Team has reviewed this patient's chart, please see recommendations below:  [facility] Quality Other; Pt has open gap for A1C.  Looks like A1C was discussed on 05/02/23 and would be as needed, would need completed before end of 2024 and compliant to close gap.  Routing as FYI for future reference in case pt is seen again.

## 2023-05-22 NOTE — Progress Notes (Signed)
I have called and left a detailed message for pt to call back to schedule a 6 month visit with shane

## 2023-05-25 ENCOUNTER — Encounter: Payer: Self-pay | Admitting: Internal Medicine

## 2023-06-02 ENCOUNTER — Other Ambulatory Visit: Payer: Self-pay | Admitting: Medical

## 2023-06-02 DIAGNOSIS — I10 Essential (primary) hypertension: Secondary | ICD-10-CM

## 2023-06-12 ENCOUNTER — Other Ambulatory Visit: Payer: Self-pay | Admitting: Medical

## 2023-07-03 ENCOUNTER — Other Ambulatory Visit: Payer: Self-pay | Admitting: Medical

## 2023-08-01 ENCOUNTER — Other Ambulatory Visit: Payer: Self-pay | Admitting: Medical

## 2023-08-01 DIAGNOSIS — I10 Essential (primary) hypertension: Secondary | ICD-10-CM

## 2023-08-14 ENCOUNTER — Ambulatory Visit (INDEPENDENT_AMBULATORY_CARE_PROVIDER_SITE_OTHER): Payer: 59 | Admitting: Medical

## 2023-08-14 ENCOUNTER — Other Ambulatory Visit: Payer: Self-pay | Admitting: Medical

## 2023-08-14 ENCOUNTER — Encounter: Payer: Self-pay | Admitting: Medical

## 2023-08-14 VITALS — BP 120/70 | HR 91 | Wt 307.6 lb

## 2023-08-14 DIAGNOSIS — Z789 Other specified health status: Secondary | ICD-10-CM

## 2023-08-14 DIAGNOSIS — Z0000 Encounter for general adult medical examination without abnormal findings: Secondary | ICD-10-CM | POA: Diagnosis not present

## 2023-08-14 DIAGNOSIS — E790 Hyperuricemia without signs of inflammatory arthritis and tophaceous disease: Secondary | ICD-10-CM

## 2023-08-14 DIAGNOSIS — E559 Vitamin D deficiency, unspecified: Secondary | ICD-10-CM | POA: Diagnosis not present

## 2023-08-14 DIAGNOSIS — E1169 Type 2 diabetes mellitus with other specified complication: Secondary | ICD-10-CM | POA: Diagnosis not present

## 2023-08-14 DIAGNOSIS — N189 Chronic kidney disease, unspecified: Secondary | ICD-10-CM | POA: Diagnosis not present

## 2023-08-14 DIAGNOSIS — E038 Other specified hypothyroidism: Secondary | ICD-10-CM | POA: Diagnosis not present

## 2023-08-14 DIAGNOSIS — I152 Hypertension secondary to endocrine disorders: Secondary | ICD-10-CM

## 2023-08-14 DIAGNOSIS — Z780 Asymptomatic menopausal state: Secondary | ICD-10-CM

## 2023-08-14 DIAGNOSIS — Z972 Presence of dental prosthetic device (complete) (partial): Secondary | ICD-10-CM

## 2023-08-14 DIAGNOSIS — Z23 Encounter for immunization: Secondary | ICD-10-CM

## 2023-08-14 DIAGNOSIS — E785 Hyperlipidemia, unspecified: Secondary | ICD-10-CM | POA: Diagnosis not present

## 2023-08-14 DIAGNOSIS — I10 Essential (primary) hypertension: Secondary | ICD-10-CM | POA: Diagnosis not present

## 2023-08-14 DIAGNOSIS — E1121 Type 2 diabetes mellitus with diabetic nephropathy: Secondary | ICD-10-CM

## 2023-08-14 DIAGNOSIS — H938X2 Other specified disorders of left ear: Secondary | ICD-10-CM

## 2023-08-14 DIAGNOSIS — H9190 Unspecified hearing loss, unspecified ear: Secondary | ICD-10-CM | POA: Diagnosis not present

## 2023-08-14 DIAGNOSIS — E1159 Type 2 diabetes mellitus with other circulatory complications: Secondary | ICD-10-CM

## 2023-08-14 NOTE — Progress Notes (Signed)
Subjective:   HPI  [name] is a 72 y.o. female who presents for Chief Complaint  Patient presents with   Annual Exam    Fasting cpe, needs A1c done in blood work, having headaches off and on for a while    Patient Care Team: Jovian Lembcke, Cleda Mccreedy as PCP - General (Family Medicine) [facility] [place] Dr. Zannie Kehr, dermatology Dr. Glenford Peers, eye Dr. Joycelyn Schmid, neurology Dr. Julio Sicks, neurosurgery [facility], Dr. Ewing Schlein, GI Dr. Aquilla Hacker, ENT Dr. Doneen Poisson, ortho   Concerns: Here for well visit  Needs handicap placard form completed  She notes that she stopped red meat for the lab additive back.  She takes a vitamin D supplement over-the-counter.  She knows she is due for her second shingles shot.  She would like a flu shot today  Compliant with medications in general  Been having some headache and ear pressure.  Sinus pressure on the left.  Thinks she may have a sinus infection.  No frequent headaches.  No numbness tingling or weakness.  No confusion.  No vision change.   Reviewed their medical, surgical, family, social, medication, and allergy history and updated chart as appropriate.  Allergies  Allergen Reactions   Crestor [Rosuvastatin]     4 prior statins, crestor, lipitor, livalo, bad cramping   Other Other (See Comments)    NO Blood Transfusions, or giving blood   Sulfa Antibiotics Rash    Past Medical History:  Diagnosis Date   Abdominal hernia    Acute blood loss as cause of postoperative anemia 02/16/2017   Arthritis    knees, back, R ankle    Bilateral carpal tunnel syndrome 04/29/2012   Diabetes mellitus    Diabetes mellitus type 2 in obese 12/31/2015   Edema    legs   GERD (gastroesophageal reflux disease)    HOH (hard of hearing)    Hypertension    Hypertriglyceridemia 08/30/2015   Started on medication 08/30/15   Lipoma 09/30/2012   Obesity    morbid obesity   OBESITY,  MORBID 10/16/2007   Qualifier: Diagnosis of  By: Abby Potash     Osteoarthrosis involving lower leg 10/16/2007   Qualifier: Diagnosis of  By: Abby Potash     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   Status post total right knee replacement 08/21/2017   Subclinical hypothyroidism 10/11/2016   Tachycardia 08/22/2011   Vertigo    Vitamin D deficiency 02/16/2017    Current Outpatient Medications on File Prior to Visit  Medication Sig Dispense Refill   Accu-Chek Softclix Lancets lancets TEST 1 TO 2 TIMES DAILY 100 each 1   albuterol (VENTOLIN HFA) 108 (90 Base) MCG/ACT inhaler INHALE 2 PUFFS INTO THE LUNGS EVERY 6 HOURS AS NEEDED FOR WHEEZING OR SHORTNESS OF BREATH 8.5 g 0   Blood Glucose Monitoring Suppl (SOLUS V2 BLOOD GLUCOSE SYSTEM) DEVI Test 1-2 times a day Dx E11.9 1 each 0   Cyanocobalamin (VITAMIN B-12 PO) Take 1 tablet by mouth daily at 2 PM.     fenofibrate (TRICOR) 145 MG tablet Take 1 tablet (145 mg total) by mouth daily. 90 tablet 3   losartan-hydrochlorothiazide (HYZAAR) 100-12.5 MG tablet TAKE 1 TABLET BY MOUTH DAILY 90 tablet 0   MAGNESIUM CITRATE PO Take 1 tablet by mouth daily as needed.     metFORMIN (GLUCOPHAGE) 1000 MG tablet TAKE 1 TABLET BY MOUTH TWICE  DAILY WITH MEALS 180 tablet 0  Multiple Vitamin (MULTIVITAMIN) tablet Take 1 tablet by mouth daily.     glucose blood (ACCU-CHEK GUIDE) test strip TEST 1 TO 2 TIMES DAILY 200 strip 1   No current facility-administered medications on file prior to visit.      Current Outpatient Medications:    Accu-Chek Softclix Lancets lancets, TEST 1 TO 2 TIMES DAILY, Disp: 100 each, Rfl: 1   albuterol (VENTOLIN HFA) 108 (90 Base) MCG/ACT inhaler, INHALE 2 PUFFS INTO THE LUNGS EVERY 6 HOURS AS NEEDED FOR WHEEZING OR SHORTNESS OF BREATH, Disp: 8.5 g, Rfl: 0   Blood Glucose Monitoring Suppl (SOLUS V2 BLOOD GLUCOSE SYSTEM) DEVI, Test 1-2 times a day Dx E11.9, Disp: 1 each, Rfl: 0   Cyanocobalamin (VITAMIN B-12 PO),  Take 1 tablet by mouth daily at 2 PM., Disp: , Rfl:    fenofibrate (TRICOR) 145 MG tablet, Take 1 tablet (145 mg total) by mouth daily., Disp: 90 tablet, Rfl: 3   losartan-hydrochlorothiazide (HYZAAR) 100-12.5 MG tablet, TAKE 1 TABLET BY MOUTH DAILY, Disp: 90 tablet, Rfl: 0   MAGNESIUM CITRATE PO, Take 1 tablet by mouth daily as needed., Disp: , Rfl:    metFORMIN (GLUCOPHAGE) 1000 MG tablet, TAKE 1 TABLET BY MOUTH TWICE  DAILY WITH MEALS, Disp: 180 tablet, Rfl: 0   Multiple Vitamin (MULTIVITAMIN) tablet, Take 1 tablet by mouth daily., Disp: , Rfl:    allopurinol (ZYLOPRIM) 100 MG tablet, TAKE 1 TABLET BY MOUTH DAILY, Disp: 90 tablet, Rfl: 3   glucose blood (ACCU-CHEK GUIDE) test strip, TEST 1 TO 2 TIMES DAILY, Disp: 200 strip, Rfl: 1  Family History  Problem Relation Age of Onset   Hypertension Mother    Kidney disease Mother    Diabetes Father    Hypertension Father    Diabetes Sister    Hypertension Sister    Hypertension Sister    Diabetes Brother    Hypertension Brother    Heart disease Brother 15       heart attack   Colon cancer Neg Hx    Esophageal cancer Neg Hx    Colon polyps Neg Hx    Pancreatic cancer Neg Hx    Rectal cancer Neg Hx    Stomach cancer Neg Hx     Past Surgical History:  Procedure Laterality Date   ANKLE FRACTURE SURGERY  1998?   right   CARPAL TUNNEL RELEASE Left 07/10/2014   Procedure: LEFT ENDOSCOPIC CARPAL TUNNEL RELEASE ;  Surgeon: Jodi Marble, MD;  Location: [facility];  Service: Orthopedics;  Laterality: Left;   CARPAL TUNNEL RELEASE Right    COLONOSCOPY     HERNIA REPAIR  07/03/2011   abd hernia   NOSE SURGERY  2008   sinus   TOTAL KNEE ARTHROPLASTY Left 02/06/2017   Procedure: LEFT TOTAL KNEE ARTHROPLASTY;  Surgeon: Kathryne Hitch, MD;  Location: [facility] OR;  Service: Orthopedics;  Laterality: Left;   TOTAL KNEE ARTHROPLASTY Right 08/21/2017   TOTAL KNEE ARTHROPLASTY Right 08/21/2017   Procedure: RIGHT TOTAL KNEE  ARTHROPLASTY;  Surgeon: Kathryne Hitch, MD;  Location: [facility] OR;  Service: Orthopedics;  Laterality: Right;   TUBAL LIGATION      Review of Systems  Constitutional:  Negative for chills, fever, malaise/fatigue and weight loss.  HENT:  Positive for ear pain, hearing loss and sinus pain. Negative for congestion, sore throat and tinnitus.   Eyes:  Negative for blurred vision, pain and redness.  Respiratory:  Negative for cough, hemoptysis and shortness of  breath.   Cardiovascular:  Negative for chest pain, palpitations, orthopnea, claudication and leg swelling.  Gastrointestinal:  Negative for abdominal pain, blood in stool, constipation, diarrhea, nausea and vomiting.  Genitourinary:  Negative for dysuria, flank pain, frequency, hematuria and urgency.  Musculoskeletal:  Negative for falls, joint pain and myalgias.  Skin:  Negative for itching and rash.  Neurological:  Negative for dizziness, tingling, speech change, weakness and headaches.  Endo/Heme/Allergies:  Negative for polydipsia. Does not bruise/bleed easily.  Psychiatric/Behavioral:  Negative for depression and memory loss. The patient is not nervous/anxious and does not have insomnia.         Objective:  BP 120/70   Pulse 91   Wt (!) 307 lb 9.6 oz (139.5 kg)   LMP  (LMP Unknown)   BMI 48.18 kg/m   Wt Readings from Last 3 Encounters:  08/14/23 (!) 307 lb 9.6 oz (139.5 kg)  04/20/23 (!) 315 lb (142.9 kg)  03/13/23 (!) 318 lb 9.6 oz (144.5 kg)   BP Readings from Last 3 Encounters:  08/14/23 120/70  04/20/23 118/68  03/13/23 124/76    General appearance: alert, no distress, WD/WN, African American female Skin: Unremarkable HEENT: normocephalic, conjunctiva/corneas normal, sclerae anicteric, PERRLA, EOMi, nares patent, no discharge or erythema, pharynx normal Oral cavity: MMM, tongue normal, teeth normal Neck: supple, no lymphadenopathy, no thyromegaly, no masses, normal ROM, no bruits Chest: non tender, normal  shape and expansion Heart: RRR, normal S1, S2, no murmurs Lungs: CTA bilaterally, no wheezes, rhonchi, or rales Abdomen: +bs, soft, non tender, non distended, no masses, no hepatomegaly, no splenomegaly, no bruits Back: non tender, normal ROM, no scoliosis Musculoskeletal: upper extremities non tender, no obvious deformity, normal ROM throughout, lower extremities non tender, no obvious deformity, normal ROM throughout Extremities: no edema, no cyanosis, no clubbing Pulses: 2+ symmetric, upper and lower extremities, normal cap refill Neurological: Somewhat cautious with gait and cautious getting up on the exam table.  Otherwise alert, oriented x 3, CN2-12 intact, strength normal upper extremities and lower extremities, sensation normal throughout, DTRs 2+ throughout, no cerebellar signs, gait normal Psychiatric: normal affect, behavior normal, pleasant  Breast/gyn/rectal - deferred to gynecology   Diabetic Foot Exam - Simple   Simple Foot Form Diabetic Foot exam was performed with the following findings: Yes 08/14/2023  4:19 PM  Visual Inspection See comments: Yes Sensation Testing See comments: Yes Pulse Check See comments: Yes Comments 1+ pedal pulses, flatfeet, somewhat thickened and hypertrophic toenails particular the great toenails, monofilament normal except the great toes seen a little decreased on the right       Assessment and Plan :   Encounter Diagnoses  Name Primary?   Encounter for health maintenance examination in adult    Vitamin D deficiency Yes   Type 2 diabetes mellitus with diabetic nephropathy, without long-term current use of insulin (HCC)    Subclinical hypothyroidism    Primary hypertension    Hypertension associated with diabetes (HCC)    Hyperlipidemia associated with type 2 diabetes mellitus (HCC)    Elevated uric acid in blood    Chronic kidney disease, unspecified CKD stage    Hearing loss, unspecified hearing loss type, unspecified laterality     Pressure sensation in left ear    Post-menopausal    Wears partial dentures    Needs flu shot    Statin intolerance      This visit was a preventative care visit, also known as wellness visit or routine physical.   Topics typically  include healthy lifestyle, diet, exercise, preventative care, vaccinations, sick and well care, proper use of emergency dept and after hours care, as well as other concerns.    Separate significant issues discussed: Vitamin D deficiency-updated labs, she is taking over-the-counter supplement  Diabetes-updated labs today, continue metformin 1000 mg twice daily  Subclinical hypo thyroidism-updated labs today  Hypertension-controlled on current medicine, continue losartan HCT 100/12.5 mg daily  Hyperlipidemia-currently on Tricor, updated labs today, prior statin intolerance  Elevated uric acid in blood-updated labs today, continue allopurinol  CKD-updated labs today  Hearing loss-mild hearing loss with screening.  She has seen audiology prior for evaluation  Arthritis-I completed her handicap placard form today  Of note, she confirmed that she is DNR status  General Recommendations: Continue to return yearly for your annual wellness and preventative care visits.  This gives [place] a chance to discuss healthy lifestyle, exercise, vaccinations, review your chart record, and perform screenings where appropriate.  I recommend you see your eye doctor yearly for routine vision care.  I recommend you see your dentist yearly for routine dental care including hygiene visits twice yearly.   Vaccination recommendations were reviewed Immunization History  Administered Date(s) Administered   Fluad Quad(high Dose 65+) 09/10/2019, 10/06/2020, 08/24/2021, 08/28/2022   Fluad Trivalent(High Dose 65+) 08/14/2023   Influenza, High Dose Seasonal PF 10/28/2018   Influenza,inj,Quad PF,6+ Mos 09/21/2016, 10/03/2017   Moderna Sars-Covid-2 Vaccination 05/25/2020, 06/22/2020,  12/29/2020, 09/09/2021   PPD Test 02/09/2017   Pneumococcal Conjugate-13 10/28/2018   Pneumococcal Polysaccharide-23 08/01/2006, 01/01/2020   RSV,unspecified 11/14/2022   Td 12/21/2003   Tdap 09/29/2015   Unspecified SARS-COV-2 Vaccination 11/14/2022   Zoster Recombinant(Shingrix) 08/19/2021    Vaccine recommendations: Counseled on the influenza virus vaccine.  Vaccine information sheet given.  Influenza vaccine given after consent obtained.  Get your Shingrix #2 at the pharmacy soon   Screening for cancer: Colon cancer screening: Due repeat 2025    Breast cancer screening: You should perform a self breast exam monthly.   We reviewed recommendations for regular mammograms and breast cancer screening. Last mammogram: 12/23   Skin cancer screening: Check your skin regularly for new changes, growing lesions, or other lesions of concern Come in for evaluation if you have skin lesions of concern.  Lung cancer screening: If you have a greater than 20 pack year history of tobacco use, then you may qualify for lung cancer screening with a chest CT scan.   Please call your insurance company to inquire about coverage for this test.  Pancreatic cancer: no current screening test is available routinely recommended.  (Risk factors: Smoking, overweight or obese, diabetes, chronic pancreatitis, work Nurse, mental health, Solicitor, 47 year old or greater, female greater than female, African-American, family history of pancreatic cancer, hereditary breast, ovarian, melanoma, Lynch, Peutz-jeghers).    Bone health: Get at least 150 minutes of aerobic exercise weekly Get weight bearing exercise at least once weekly Bone density test:  A bone density test is an imaging test that uses a type of X-ray to measure the amount of calcium and other minerals in your bones. The test may be used to diagnose or screen you for a condition that causes weak or thin bones (osteoporosis), predict your risk  for a broken bone (fracture), or determine how well your osteoporosis treatment is working. The bone density test is recommended for females 65 and older, or females or males <65 if certain risk factors such as thyroid disease, long term use of steroids such as for asthma or rheumatological issues,  vitamin D deficiency, estrogen deficiency, family history of osteoporosis, self or family history of fragility fracture in first degree relative.  Bone density screening:  Please call to schedule your bone density test.   [facility]  [phone] [address] [place], [place] [address] cers besides breast, cervical, colon, and lung cancers.  If you have a strong family history of cancer or have other cancer screening concerns, please let me know.     Heart health: Get at least 150 minutes of aerobic exercise weekly Limit alcohol It is important to maintain a healthy blood pressure and healthy cholesterol numbers  Heart disease screening: Screening for heart disease includes screening for blood pressure, fasting lipids, glucose/diabetes screening, BMI height to weight ratio, reviewed of smoking status, physical activity, and diet.    Goals include blood pressure 120/80 or less, maintaining a healthy lipid/cholesterol profile, preventing diabetes or keeping diabetes numbers under good control, not smoking or using tobacco products, exercising most days per week or at least 150 minutes per week of exercise, and eating healthy variety of fruits and vegetables, healthy oils, and avoiding unhealthy food choices like fried food, fast food, high sugar and high cholesterol foods.    Other tests may possibly include EKG test, CT coronary calcium score, echocardiogram, exercise treadmill stress test.     Vascular disease screening: For high risk individuals including smokers, diabetes, patients with known heart disease or high blood pressure, kidney disease,  and others, screening for vascular disease or atherosclerosis of the arteries is available.  Examples may include carotid ultrasound, abdominal aortic ultrasound, ABI blood flow screening in the legs, thoracic aorta screening.  2022 normal ABI screen reviewed   Medical care options: I recommend you continue to seek care here first for routine care.  We try really hard to have available appointments Monday through Friday daytime hours for sick visits, acute visits, and physicals.  Urgent care should be used for after hours and weekends for significant issues that cannot wait till the next day.  The emergency department should be used for significant potentially life-threatening emergencies.  The emergency department is expensive, can often have long wait times for less significant concerns, so try to utilize primary care, urgent care, or telemedicine when possible to avoid unnecessary trips to the emergency department.  Virtual visits and telemedicine have been introduced since the pandemic started in 2020, and can be convenient ways to receive medical care.  We offer virtual appointments as well to assist you in a variety of options to seek medical care.   Legal Take the time to do a Last Will and Testament, advanced directives including Healthcare Power of Attorney and Living Will documents.  Do not leave your family with burdens that can be handled ahead of time.   Advanced Directives: I recommend you consider completing a Health Care Power of Attorney and Living Will.   These documents respect your wishes and help alleviate burdens on your loved ones if you were to become terminally ill or be in a position to need those documents enforced.    You can complete Advanced Directives yourself, have them notarized, then have copies made for our office, for you and for anybody you feel should have them in safe keeping.  Or, you can have an attorney prepare these documents.   If you haven't updated your  Last Will and Testament in a while, it may be worthwhile having an attorney prepare these documents together and  save on some costs.       Spiritual and Emotional Health Keeping a healthy spiritual life can help you better manage your physical health. Your spiritual life can help you to cope with any issues that may arise with your physical health.  Balance can keep [place] healthy and help [place] to recover.  If you are struggling with your spiritual health there are questions that you may want to ask yourself:  What makes me feel most complete? When do I feel most connected to the rest of the world? Where do I find the most inner strength? What am I doing when I feel whole?  Helpful tips: Being in nature. Some people feel very connected and at peace when they are walking outdoors or are outside. Helping others. Some feel the largest sense of wellbeing when they are of service to others. Being of service can take on many forms. It can be doing volunteer work, being kind to strangers, or offering a hand to a friend in need. Gratitude. Some people find they feel the most connected when they remain grateful. They may make lists of all the things they are grateful for or say a thank you out loud for all they have.    Emotional Health Are you in tune with your emotional health?  Check out this link: http://www.marquez-love.com/   Financial Health Make sure you use a budget for your personal finances Make sure you are insured against risks (health insurance, life insurance, auto insurance, etc) Save more, spend less Set financial goals If you need help in this area, good resources include counseling through [employer] or other community resources, have a meeting with a Social research officer, government, and a good resource is Salley Slaughter podcast    Noeli was seen today for annual exam.  Diagnoses and all orders for this visit:  Vitamin D deficiency  Encounter for health maintenance  examination in adult -     CBC -     TSH + free T4 -     Hemoglobin A1c -     Microalbumin/Creatinine Ratio, Urine -     Lipid panel -     VITAMIN D 25 Hydroxy (Vit-D Deficiency, Fractures) -     Renal Function Panel -     Hepatic function panel  Type 2 diabetes mellitus with diabetic nephropathy, without long-term current use of insulin (HCC) -     Hemoglobin A1c -     Microalbumin/Creatinine Ratio, Urine  Subclinical hypothyroidism -     TSH + free T4  Primary hypertension  Hypertension associated with diabetes (HCC)  Hyperlipidemia associated with type 2 diabetes mellitus (HCC) -     Lipid panel  Elevated uric acid in blood -     DG Bone Density; Future  Chronic kidney disease, unspecified CKD stage -     Renal Function Panel -     DG Bone Density; Future  Hearing loss, unspecified hearing loss type, unspecified laterality  Pressure sensation in left ear  Post-menopausal -     DG Bone Density; Future  Wears partial dentures  Needs flu shot -     Flu Vaccine Trivalent High Dose (Fluad)  Statin intolerance     Follow-up pending labs, yearly for physical

## 2023-08-15 ENCOUNTER — Other Ambulatory Visit: Payer: Self-pay | Admitting: Internal Medicine

## 2023-08-15 ENCOUNTER — Other Ambulatory Visit: Payer: Self-pay | Admitting: Medical

## 2023-08-15 LAB — RENAL FUNCTION PANEL
Albumin: 4 g/dL (ref 3.9–4.9)
BUN/Creatinine Ratio: 22 (ref 12–28)
BUN: 26 mg/dL (ref 8–27)
CO2: 24 mmol/L (ref 20–29)
Calcium: 9.6 mg/dL (ref 8.7–10.3)
Chloride: 104 mmol/L (ref 96–106)
Creatinine, Ser: 1.18 mg/dL — ABNORMAL HIGH (ref 0.57–1.00)
Glucose: 141 mg/dL — ABNORMAL HIGH (ref 70–99)
Phosphorus: 2.5 mg/dL — ABNORMAL LOW (ref 3.0–4.3)
Potassium: 4.6 mmol/L (ref 3.5–5.2)
Sodium: 143 mmol/L (ref 134–144)
eGFR: 50 mL/min/{1.73_m2} — ABNORMAL LOW (ref >59)

## 2023-08-15 LAB — CBC
Hematocrit: 34.6 % (ref 34.0–46.6)
Hemoglobin: 11.4 g/dL (ref 11.1–15.9)
MCH: 29.1 pg (ref 26.6–33.0)
MCHC: 32.9 g/dL (ref 31.5–35.7)
MCV: 88 fL (ref 79–97)
Platelets: 352 10*3/uL (ref 150–450)
RBC: 3.92 x10E6/uL (ref 3.77–5.28)
RDW: 13.2 % (ref 11.7–15.4)
WBC: 7.5 10*3/uL (ref 3.4–10.8)

## 2023-08-15 LAB — LIPID PANEL
Chol/HDL Ratio: 3.7 ratio (ref 0.0–4.4)
Cholesterol, Total: 158 mg/dL (ref 100–199)
HDL: 43 mg/dL (ref >39)
LDL Chol Calc (NIH): 96 mg/dL (ref 0–99)
Triglycerides: 101 mg/dL (ref 0–149)
VLDL Cholesterol Cal: 19 mg/dL (ref 5–40)

## 2023-08-15 LAB — HEPATIC FUNCTION PANEL
ALT: 13 IU/L (ref 0–32)
AST: 13 IU/L (ref 0–40)
Alkaline Phosphatase: 57 IU/L (ref 44–121)
Bilirubin Total: <0.2 mg/dL (ref 0.0–1.2)
Bilirubin, Direct: <0.1 mg/dL (ref 0.00–0.40)
Total Protein: 6.7 g/dL (ref 6.0–8.5)

## 2023-08-15 LAB — MICROALBUMIN / CREATININE URINE RATIO
Creatinine, Urine: 151.6 mg/dL
Microalb/Creat Ratio: 18 mg/g{creat} (ref 0–29)
Microalbumin, Urine: 26.7 ug/mL

## 2023-08-15 LAB — VITAMIN D 25 HYDROXY (VIT D DEFICIENCY, FRACTURES): Vit D, 25-Hydroxy: 51 ng/mL (ref 30.0–100.0)

## 2023-08-15 LAB — TSH+FREE T4
Free T4: 1.5 ng/dL (ref 0.82–1.77)
TSH: 3.13 u[IU]/mL (ref 0.450–4.500)

## 2023-08-15 LAB — HEMOGLOBIN A1C
Est. average glucose Bld gHb Est-mCnc: 200 mg/dL
Hgb A1c MFr Bld: 8.6 % — ABNORMAL HIGH (ref 4.8–5.6)

## 2023-08-15 MED ORDER — MOUNJARO 10 MG/0.5ML ~~LOC~~ SOAJ: 10.0000 mg | SUBCUTANEOUS | 1 refills | Status: DC

## 2023-08-15 NOTE — Progress Notes (Signed)
Diabetes marker too high at 8.6%.  Kidney marker abnormal but stable.  Blood counts normal.  Cholesterol okay.  Vitamin D okay.  Liver test okay.  Thyroid okay.  Still pending microalbumin kidney marker.  I recommend we add a medicine such as Ozempic or Mounjaro weekly injections to help control sugars and to help with weight.  I need you to decrease your metformin to 500 mg twice a day given the kidney function numbers.  I will send a new prescription for the metformin.  In the meantime you can cut the 1000 mg in half and do 1/2 tablet twice a day to equal 500 mg twice a day  Continue rest of medicines as usual  Sabrina-see if agreeable to Ozempic and Mounjaro weekly medication assuming we can get this covered by insurance

## 2023-09-09 ENCOUNTER — Other Ambulatory Visit: Payer: Self-pay

## 2023-09-09 ENCOUNTER — Emergency Department (HOSPITAL_COMMUNITY): Payer: 59

## 2023-09-09 ENCOUNTER — Encounter (HOSPITAL_COMMUNITY): Payer: Self-pay

## 2023-09-09 ENCOUNTER — Inpatient Hospital Stay (HOSPITAL_COMMUNITY)
Admission: EM | Admit: 2023-09-09 | Discharge: 2023-09-11 | DRG: 175 | Disposition: A | Discharge status: Home / Self Care | Payer: 59 | Attending: Student | Admitting: Student

## 2023-09-09 DIAGNOSIS — I2699 Other pulmonary embolism without acute cor pulmonale: Principal | ICD-10-CM | POA: Diagnosis present

## 2023-09-09 DIAGNOSIS — E1122 Type 2 diabetes mellitus with diabetic chronic kidney disease: Secondary | ICD-10-CM | POA: Diagnosis not present

## 2023-09-09 DIAGNOSIS — Z96653 Presence of artificial knee joint, bilateral: Secondary | ICD-10-CM | POA: Diagnosis not present

## 2023-09-09 DIAGNOSIS — Z7984 Long term (current) use of oral hypoglycemic drugs: Secondary | ICD-10-CM

## 2023-09-09 DIAGNOSIS — R059 Cough, unspecified: Secondary | ICD-10-CM | POA: Diagnosis not present

## 2023-09-09 DIAGNOSIS — I82452 Acute embolism and thrombosis of left peroneal vein: Secondary | ICD-10-CM | POA: Diagnosis present

## 2023-09-09 DIAGNOSIS — E1169 Type 2 diabetes mellitus with other specified complication: Secondary | ICD-10-CM | POA: Diagnosis not present

## 2023-09-09 DIAGNOSIS — D631 Anemia in chronic kidney disease: Secondary | ICD-10-CM | POA: Diagnosis present

## 2023-09-09 DIAGNOSIS — Z7901 Long term (current) use of anticoagulants: Secondary | ICD-10-CM

## 2023-09-09 DIAGNOSIS — K219 Gastro-esophageal reflux disease without esophagitis: Secondary | ICD-10-CM | POA: Diagnosis not present

## 2023-09-09 DIAGNOSIS — Z833 Family history of diabetes mellitus: Secondary | ICD-10-CM

## 2023-09-09 DIAGNOSIS — I10 Essential (primary) hypertension: Secondary | ICD-10-CM

## 2023-09-09 DIAGNOSIS — E038 Other specified hypothyroidism: Secondary | ICD-10-CM | POA: Diagnosis present

## 2023-09-09 DIAGNOSIS — Z8249 Family history of ischemic heart disease and other diseases of the circulatory system: Secondary | ICD-10-CM | POA: Diagnosis not present

## 2023-09-09 DIAGNOSIS — Z79899 Other long term (current) drug therapy: Secondary | ICD-10-CM | POA: Diagnosis not present

## 2023-09-09 DIAGNOSIS — R0989 Other specified symptoms and signs involving the circulatory and respiratory systems: Secondary | ICD-10-CM | POA: Diagnosis not present

## 2023-09-09 DIAGNOSIS — D649 Anemia, unspecified: Secondary | ICD-10-CM | POA: Diagnosis not present

## 2023-09-09 DIAGNOSIS — Z882 Allergy status to sulfonamides status: Secondary | ICD-10-CM | POA: Diagnosis not present

## 2023-09-09 DIAGNOSIS — E049 Nontoxic goiter, unspecified: Secondary | ICD-10-CM | POA: Diagnosis present

## 2023-09-09 DIAGNOSIS — Z7985 Long-term (current) use of injectable non-insulin antidiabetic drugs: Secondary | ICD-10-CM | POA: Diagnosis not present

## 2023-09-09 DIAGNOSIS — I499 Cardiac arrhythmia, unspecified: Secondary | ICD-10-CM | POA: Diagnosis not present

## 2023-09-09 DIAGNOSIS — N1831 Chronic kidney disease, stage 3a: Secondary | ICD-10-CM | POA: Diagnosis present

## 2023-09-09 DIAGNOSIS — N189 Chronic kidney disease, unspecified: Secondary | ICD-10-CM | POA: Diagnosis present

## 2023-09-09 DIAGNOSIS — E782 Mixed hyperlipidemia: Secondary | ICD-10-CM | POA: Diagnosis not present

## 2023-09-09 DIAGNOSIS — E1121 Type 2 diabetes mellitus with diabetic nephropathy: Secondary | ICD-10-CM | POA: Diagnosis present

## 2023-09-09 DIAGNOSIS — I152 Hypertension secondary to endocrine disorders: Secondary | ICD-10-CM | POA: Diagnosis present

## 2023-09-09 DIAGNOSIS — Z6841 Body Mass Index (BMI) 40.0 and over, adult: Secondary | ICD-10-CM

## 2023-09-09 DIAGNOSIS — R739 Hyperglycemia, unspecified: Secondary | ICD-10-CM | POA: Diagnosis not present

## 2023-09-09 DIAGNOSIS — Z888 Allergy status to other drugs, medicaments and biological substances status: Secondary | ICD-10-CM | POA: Diagnosis not present

## 2023-09-09 DIAGNOSIS — R079 Chest pain, unspecified: Secondary | ICD-10-CM | POA: Diagnosis not present

## 2023-09-09 DIAGNOSIS — E785 Hyperlipidemia, unspecified: Secondary | ICD-10-CM | POA: Diagnosis not present

## 2023-09-09 DIAGNOSIS — R918 Other nonspecific abnormal finding of lung field: Secondary | ICD-10-CM | POA: Diagnosis not present

## 2023-09-09 DIAGNOSIS — E1159 Type 2 diabetes mellitus with other circulatory complications: Secondary | ICD-10-CM | POA: Diagnosis present

## 2023-09-09 DIAGNOSIS — E042 Nontoxic multinodular goiter: Secondary | ICD-10-CM | POA: Diagnosis not present

## 2023-09-09 DIAGNOSIS — R0602 Shortness of breath: Secondary | ICD-10-CM | POA: Diagnosis not present

## 2023-09-09 DIAGNOSIS — I2609 Other pulmonary embolism with acute cor pulmonale: Secondary | ICD-10-CM | POA: Diagnosis not present

## 2023-09-09 DIAGNOSIS — Z743 Need for continuous supervision: Secondary | ICD-10-CM | POA: Diagnosis not present

## 2023-09-09 DIAGNOSIS — Z841 Family history of disorders of kidney and ureter: Secondary | ICD-10-CM

## 2023-09-09 DIAGNOSIS — R599 Enlarged lymph nodes, unspecified: Secondary | ICD-10-CM | POA: Diagnosis not present

## 2023-09-09 DIAGNOSIS — R6889 Other general symptoms and signs: Secondary | ICD-10-CM | POA: Diagnosis not present

## 2023-09-09 LAB — CBC WITH DIFFERENTIAL/PLATELET
Abs Immature Granulocytes: 0.04 10*3/uL (ref 0.00–0.07)
Basophils Absolute: 0 10*3/uL (ref 0.0–0.1)
Basophils Relative: 0 %
Eosinophils Absolute: 0 10*3/uL (ref 0.0–0.5)
Eosinophils Relative: 0 %
HCT: 34.6 % — ABNORMAL LOW (ref 36.0–46.0)
Hemoglobin: 10.7 g/dL — ABNORMAL LOW (ref 12.0–15.0)
Immature Granulocytes: 0 %
Lymphocytes Relative: 14 %
Lymphs Abs: 1.4 10*3/uL (ref 0.7–4.0)
MCH: 29.5 pg (ref 26.0–34.0)
MCHC: 30.9 g/dL (ref 30.0–36.0)
MCV: 95.3 fL (ref 80.0–100.0)
Monocytes Absolute: 0.5 10*3/uL (ref 0.1–1.0)
Monocytes Relative: 6 %
Neutro Abs: 7.8 10*3/uL — ABNORMAL HIGH (ref 1.7–7.7)
Neutrophils Relative %: 80 %
Platelets: 293 10*3/uL (ref 150–400)
RBC: 3.63 MIL/uL — ABNORMAL LOW (ref 3.87–5.11)
RDW: 14.6 % (ref 11.5–15.5)
WBC: 9.8 10*3/uL (ref 4.0–10.5)
nRBC: 0 % (ref 0.0–0.2)

## 2023-09-09 LAB — COMPREHENSIVE METABOLIC PANEL
ALT: 18 U/L (ref 0–44)
AST: 20 U/L (ref 15–41)
Albumin: 3.4 g/dL — ABNORMAL LOW (ref 3.5–5.0)
Alkaline Phosphatase: 56 U/L (ref 38–126)
Anion gap: 11 (ref 5–15)
BUN: 30 mg/dL — ABNORMAL HIGH (ref 8–23)
CO2: 22 mmol/L (ref 22–32)
Calcium: 8.8 mg/dL — ABNORMAL LOW (ref 8.9–10.3)
Chloride: 104 mmol/L (ref 98–111)
Creatinine, Ser: 1.4 mg/dL — ABNORMAL HIGH (ref 0.44–1.00)
GFR, Estimated: 41 mL/min — ABNORMAL LOW (ref >60)
Glucose, Bld: 237 mg/dL — ABNORMAL HIGH (ref 70–99)
Potassium: 4.4 mmol/L (ref 3.5–5.1)
Sodium: 137 mmol/L (ref 135–145)
Total Bilirubin: 0.8 mg/dL (ref 0.3–1.2)
Total Protein: 7.2 g/dL (ref 6.5–8.1)

## 2023-09-09 LAB — GLUCOSE, CAPILLARY
Glucose-Capillary: 155 mg/dL — ABNORMAL HIGH (ref 70–99)
Glucose-Capillary: 225 mg/dL — ABNORMAL HIGH (ref 70–99)

## 2023-09-09 LAB — BRAIN NATRIURETIC PEPTIDE: B Natriuretic Peptide: 167.4 pg/mL — ABNORMAL HIGH (ref 0.0–100.0)

## 2023-09-09 LAB — HEPARIN LEVEL (UNFRACTIONATED): Heparin Unfractionated: 0.2 IU/mL — ABNORMAL LOW (ref 0.30–0.70)

## 2023-09-09 LAB — I-STAT CHEM 8, ED
BUN: 26 mg/dL — ABNORMAL HIGH (ref 8–23)
Calcium, Ion: 1.14 mmol/L — ABNORMAL LOW (ref 1.15–1.40)
Chloride: 106 mmol/L (ref 98–111)
Creatinine, Ser: 1.4 mg/dL — ABNORMAL HIGH (ref 0.44–1.00)
Glucose, Bld: 214 mg/dL — ABNORMAL HIGH (ref 70–99)
HCT: 35 % — ABNORMAL LOW (ref 36.0–46.0)
Hemoglobin: 11.9 g/dL — ABNORMAL LOW (ref 12.0–15.0)
Potassium: 3.7 mmol/L (ref 3.5–5.1)
Sodium: 139 mmol/L (ref 135–145)
TCO2: 21 mmol/L — ABNORMAL LOW (ref 22–32)

## 2023-09-09 LAB — CBG MONITORING, ED: Glucose-Capillary: 197 mg/dL — ABNORMAL HIGH (ref 70–99)

## 2023-09-09 LAB — TROPONIN I (HIGH SENSITIVITY)
Troponin I (High Sensitivity): 17 ng/L (ref <18)
Troponin I (High Sensitivity): 19 ng/L — ABNORMAL HIGH (ref <18)

## 2023-09-09 LAB — D-DIMER, QUANTITATIVE: D-Dimer, Quant: 2.13 ug/mL-FEU — ABNORMAL HIGH (ref 0.00–0.50)

## 2023-09-09 MED ORDER — ONDANSETRON HCL 4 MG PO TABS: 4.0000 mg | ORAL_TABLET | Freq: Four times a day (QID) | ORAL | Status: DC | PRN

## 2023-09-09 MED ORDER — LACTATED RINGERS IV SOLN: INTRAVENOUS | Status: DC

## 2023-09-09 MED ORDER — HEPARIN BOLUS VIA INFUSION
3000.0000 [IU] | Freq: Once | INTRAVENOUS | Status: AC
  Administered 2023-09-09: 3000 [IU] via INTRAVENOUS
  Filled 2023-09-09: qty 3000

## 2023-09-09 MED ORDER — ACETAMINOPHEN 325 MG PO TABS
650.0000 mg | ORAL_TABLET | Freq: Four times a day (QID) | ORAL | Status: DC | PRN
  Administered 2023-09-10: 650 mg via ORAL
  Filled 2023-09-09: qty 2

## 2023-09-09 MED ORDER — ALLOPURINOL 100 MG PO TABS
100.0000 mg | ORAL_TABLET | Freq: Every day | ORAL | Status: DC
  Administered 2023-09-09 – 2023-09-11 (×3): 100 mg via ORAL
  Filled 2023-09-09 (×3): qty 1

## 2023-09-09 MED ORDER — ONDANSETRON HCL 4 MG/2ML IJ SOLN: 4.0000 mg | Freq: Four times a day (QID) | INTRAMUSCULAR | Status: DC | PRN

## 2023-09-09 MED ORDER — HEPARIN (PORCINE) 25000 UT/250ML-% IV SOLN
1950.0000 [IU]/h | INTRAVENOUS | Status: AC
  Administered 2023-09-09: 1750 [IU]/h via INTRAVENOUS
  Administered 2023-09-10 – 2023-09-11 (×3): 1950 [IU]/h via INTRAVENOUS
  Filled 2023-09-09 (×4): qty 250

## 2023-09-09 MED ORDER — ACETAMINOPHEN 650 MG RE SUPP: 650.0000 mg | Freq: Four times a day (QID) | RECTAL | Status: DC | PRN

## 2023-09-09 MED ORDER — HEPARIN BOLUS VIA INFUSION
1500.0000 [IU] | Freq: Once | INTRAVENOUS | Status: AC
  Administered 2023-09-09: 1500 [IU] via INTRAVENOUS
  Filled 2023-09-09: qty 1500

## 2023-09-09 MED ORDER — IOHEXOL 350 MG/ML SOLN
75.0000 mL | Freq: Once | INTRAVENOUS | Status: AC | PRN
  Administered 2023-09-09: 75 mL via INTRAVENOUS

## 2023-09-09 MED ORDER — INSULIN ASPART 100 UNIT/ML IJ SOLN
0.0000 [IU] | Freq: Three times a day (TID) | INTRAMUSCULAR | Status: DC
  Administered 2023-09-09 – 2023-09-10 (×3): 4 [IU] via SUBCUTANEOUS
  Administered 2023-09-10: 3 [IU] via SUBCUTANEOUS
  Administered 2023-09-10: 7 [IU] via SUBCUTANEOUS
  Administered 2023-09-11 (×2): 4 [IU] via SUBCUTANEOUS
  Filled 2023-09-09: qty 0.2

## 2023-09-09 MED ORDER — FENOFIBRATE 160 MG PO TABS
160.0000 mg | ORAL_TABLET | Freq: Every day | ORAL | Status: DC
  Administered 2023-09-09 – 2023-09-11 (×3): 160 mg via ORAL
  Filled 2023-09-09 (×3): qty 1

## 2023-09-09 MED ORDER — METFORMIN HCL 500 MG PO TABS: 1000.0000 mg | ORAL_TABLET | Freq: Two times a day (BID) | ORAL | Status: DC

## 2023-09-09 NOTE — Plan of Care (Signed)

## 2023-09-09 NOTE — H&P (Signed)
History and Physical    Patient: [name] UXL:[id number] DOB: November 18, 1951 DOA: 09/09/2023 DOS: the patient was seen and examined on 09/09/2023 PCP: [name], PA-C  Patient coming from: Home  Chief Complaint:  Chief Complaint  Patient presents with   Chest Pain   Shortness of Breath   HPI: [name] is a 72 y.o. 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  Her O2 sat at home was 88% on room air.  She was placed on 3 LPM via [place] by the ambulance crew.  She also received aspirin 324 mg p.o. x 1.  No travel history.  She sits at home, but stated that she gets up often and moves around.  She gets frequent lower extremity edema.  She denied fever, chills, rhinorrhea, sore throat, wheezing or hemoptysis.  No palpitations, diaphoresis, PND or orthopnea.  No abdominal pain, nausea, emesis, diarrhea, constipation, melena or hematochezia.  No flank pain, dysuria, frequency or hematuria.  No polyuria, polydipsia, polyphagia or blurred vision.   Lab work: Her CBC showed a white count of 9.8, hemoglobin 10.7 g/dL and platelets 536.  D-dimer was 2.13.  Troponin was 17 then 19 ng/L.  BNP 867.4 pg/mL.  CMP showed normal electrolytes after calcium correction.  Hepatic functions unremarkable except for an albumin of 3.4 g/dL.  Glucose 237, BUN 30 and creatinine 1.40 mg/dL.  Imaging: Portable 1 view chest radiograph with low lung volumes but no acute cardiopulmonary disease.  CTA chest showing moderate burden of acute pulmonary emboli  bilaterally described with evidence of right heart strain.  There is a subtle patchy hazy opacification over the left lower lobe and subtle hazy groundglass attenuation over the left upper lobe likely related to the patient's acute emboli.  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  3 cm hypodense nodule over the left lobe of.  Nonemergent thyroid EUS recommended.  Aortic atherosclerosis.   ED course: Initial vital signs were temperature 98.5 F, pulse 93, respiration 20, BP 115/72 mmHg and O2 sat 94% on 3 LPM via nasal cannula.  The patient was started on a heparin infusion after 3000 units IV heparin bolus.  Review of Systems: As mentioned in the history of present illness. All other systems reviewed and are negative. Past Medical History:  Diagnosis Date   Abdominal hernia    Acute blood loss as cause of postoperative anemia 02/16/2017   Arthritis    knees, back, R ankle    Bilateral carpal tunnel syndrome 04/29/2012   Diabetes mellitus    Diabetes mellitus type 2 in obese 12/31/2015   Edema    legs   GERD (gastroesophageal reflux disease)    HOH (hard of hearing)    Hypertension    Hypertriglyceridemia 08/30/2015   Started on medication 08/30/15   Lipoma 09/30/2012   Obesity    morbid obesity   OBESITY, MORBID 10/16/2007   Qualifier: Diagnosis of  By: Abby Potash     Osteoarthrosis involving lower leg 10/16/2007   Qualifier: Diagnosis of  By: Abby Potash     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   right   CARPAL TUNNEL RELEASE Left 07/10/2014   Procedure: LEFT ENDOSCOPIC CARPAL TUNNEL RELEASE ;  Surgeon: Jodi Marble, MD;  Location: [facility];  Service:  Orthopedics;  Laterality: Left;   CARPAL TUNNEL RELEASE Right    COLONOSCOPY     HERNIA REPAIR  07/03/2011   abd hernia   NOSE SURGERY  2008   sinus   TOTAL KNEE ARTHROPLASTY Left 02/06/2017   Procedure: LEFT TOTAL KNEE ARTHROPLASTY;  Surgeon: Kathryne Hitch, MD;  Location: [facility];  Service: Orthopedics;  Laterality: Left;   TOTAL KNEE ARTHROPLASTY Right 08/21/2017   TOTAL KNEE ARTHROPLASTY Right 08/21/2017   Procedure: RIGHT TOTAL KNEE ARTHROPLASTY;  Surgeon: Kathryne Hitch, MD;  Location: [facility] OR;  Service: Orthopedics;  Laterality: Right;   TUBAL LIGATION     Social History:  reports that she has never smoked. She has never used smokeless tobacco. She reports current alcohol use. She reports that she does not use drugs.  Allergies  Allergen Reactions   Crestor [Rosuvastatin]     4 prior statins, crestor, lipitor, livalo, bad cramping   Other Other (See Comments)    NO Blood Transfusions, or giving blood   Sulfa Antibiotics Rash    Family History  Problem Relation Age of Onset   Hypertension Mother    Kidney disease Mother    Diabetes Father    Hypertension Father    Diabetes Sister    Hypertension Sister    Hypertension Sister    Diabetes Brother    Hypertension Brother    Heart disease Brother 40       heart attack   Colon cancer Neg Hx    Esophageal cancer Neg Hx    Colon polyps Neg Hx    Pancreatic cancer Neg Hx    Rectal cancer Neg Hx    Stomach cancer Neg Hx     Prior to Admission medications   Medication Sig Start Date End Date Taking? Authorizing Provider  Accu-Chek Softclix Lancets lancets TEST 1 TO 2 TIMES DAILY 09/20/20   Henson, Vickie L, NP-C  albuterol (VENTOLIN HFA) 108 (90 Base) MCG/ACT inhaler INHALE 2 PUFFS INTO THE LUNGS EVERY 6 HOURS AS NEEDED FOR WHEEZING OR SHORTNESS OF BREATH 11/15/22   Tysinger, Kermit Balo, PA-C  allopurinol (ZYLOPRIM) 100 MG tablet TAKE 1 TABLET BY MOUTH DAILY 08/14/23   Tysinger, Kermit Balo, PA-C  Blood Glucose  Monitoring Suppl (SOLUS V2 BLOOD GLUCOSE SYSTEM) DEVI Test 1-2 times a day Dx E11.9 08/10/20   Henson, Vickie L, NP-C  Cyanocobalamin (VITAMIN B-12 PO) Take 1 tablet by mouth daily at 2 PM.    [provider]  fenofibrate (TRICOR) 145 MG tablet Take 1 tablet (145 mg total) by mouth daily. 10/03/22   Tysinger, Kermit Balo, PA-C  glucose blood (ACCU-CHEK GUIDE) test strip TEST 1 TO 2 TIMES DAILY 12/13/21   Ronnald Nian, MD  losartan-hydrochlorothiazide [facility]) 100-12.5 MG tablet TAKE 1 TABLET BY MOUTH DAILY 08/02/23   Tysinger, Kermit Balo, PA-C  MAGNESIUM CITRATE PO Take 1 tablet by mouth daily as needed.    [provider]  metFORMIN (GLUCOPHAGE) 1000 MG tablet TAKE 1 TABLET BY MOUTH TWICE  DAILY WITH MEALS 07/03/23   Tysinger,  Kermit Balo, PA-C  Multiple Vitamin (MULTIVITAMIN) tablet Take 1 tablet by mouth daily.    [provider]  tirzepatide Gregg[facility] Keen) 10 MG/0.5ML Pen Inject 10 mg into the skin once a week. 08/15/23   [name], PA-C    Physical Exam: Vitals:   09/09/23 1015 09/09/23 1027 09/09/23 1034 09/09/23 1135  BP: 115/72   111/66  Pulse: 93   (!) 103  Resp: 20   (!) 21  Temp: 98.5 F (36.9 C)     TempSrc: Oral     SpO2: 92%  92% 94%  Weight:  (!) 139.3 kg    Height:  5\' 6"  (1.676 m)     Physical Exam Vitals reviewed.  Constitutional:      General: She is awake. She is not in acute distress.    Appearance: She is morbidly obese. She is ill-appearing. She is not toxic-appearing.  HENT:     Head: Normocephalic.     Nose: No rhinorrhea.     Mouth/Throat:     Mouth: Mucous membranes are moist.  Eyes:     General: No scleral icterus.    Pupils: Pupils are equal, round, and reactive to light.  Neck:     Vascular: No JVD.  Cardiovascular:     Rate and Rhythm: Regular rhythm. Tachycardia present.     Heart sounds: S1 normal and S2 normal.  Pulmonary:     Effort: Pulmonary effort is normal.     Breath sounds: Normal breath sounds. No wheezing,  rhonchi or rales.  Abdominal:     General: Bowel sounds are normal. There is no distension.     Palpations: Abdomen is soft.     Tenderness: There is no abdominal tenderness.  Musculoskeletal:     Cervical back: Neck supple.     Right lower leg: 1+ Edema present.     Left lower leg: 1+ Edema present.  Skin:    General: Skin is warm and dry.  Neurological:     General: No focal deficit present.     Mental Status: She is alert and oriented to person, place, and time.  Psychiatric:        Mood and Affect: Mood normal.        Behavior: Behavior normal. Behavior is cooperative.    Data Reviewed:  Results are pending, will review when available.  Assessment and Plan: Principal Problem:   Bilateral pulmonary embolism (HCC) Observation/PCU. Supplemental oxygen. Continue heparin infusion. Discussed need DOAC for 6 months. Normal troponin level. Obtain echocardiogram. Check bilateral lower extremity venous Doppler.  Active Problems:   Normocytic anemia Monitor hematocrit and hemoglobin. Transfuse as needed.    Hypertension associated with diabetes (HCC) BP normal now but mildly tachycardic.   Will hold losartan and hydrochlorothiazide.    Type 2 diabetes mellitus with diabetic nephropathy (HCC) Carbohydrate modified diet. CBG monitoring with RI SS. Continue metformin 1000 mg p.o. twice daily. Check hemoglobin A1c level.    Mixed hyperlipidemia Continue fenofibrate 145 mg p.o. daily or formulary equivalent.    Subclinical hypothyroidism Follow-up with PCP.    GERD (gastroesophageal reflux disease) Antiacid, H2 blocker or PPI as needed.    OBESITY, MORBID Current BMI 49.55 kg/m. Lifestyle modifications. Follow-up with closely PCP and/or bariatric clinic.     Advance Care Planning:   Code Status: Full Code   Consults:   Family Communication:   Severity of Illness: The appropriate patient status for this patient is OBSERVATION. Observation status is judged  to be  reasonable and necessary in order to provide the required intensity of service to ensure the patient's safety. The patient's presenting symptoms, physical exam findings, and initial radiographic and laboratory data in the context of their medical condition is felt to place them at decreased risk for further clinical deterioration. Furthermore, it is anticipated that the patient will be medically stable for discharge from the hospital within 2 midnights of admission.   Author: Bobette Mo, MD 09/09/2023 1:04 PM  For on call review [email or web address].   This document was prepared using Dragon voice recognition software and may contain some unintended transcription errors.

## 2023-09-09 NOTE — Progress Notes (Signed)
ANTICOAGULATION CONSULT NOTE -follow up  Pharmacy Consult for heparin  Indication: acute pulmonary embolus  Allergies  Allergen Reactions   Crestor [Rosuvastatin]     4 prior statins, crestor, lipitor, livalo, bad cramping   Other Other (See Comments)    NO Blood Transfusions, or giving blood   Sulfa Antibiotics Rash    Patient Measurements: Height: 5\' 6"  (167.6 cm) Weight: (!) 139.3 kg (307 lb) IBW/kg (Calculated) : 59.3 Heparin Dosing Weight: 94 kg  Vital Signs: Temp: 99.1 F (37.3 C) (09/22 2049) Temp Source: Oral (09/22 2049) BP: 106/60 (09/22 2049) Pulse Rate: 104 (09/22 2049)  Labs: Recent Labs    09/09/23 1103 09/09/23 1218 09/09/23 1311 09/09/23 2021  HGB 10.7* 11.9*  --   --   HCT 34.6* 35.0*  --   --   PLT 293  --   --   --   HEPARINUNFRC  --   --   --  0.20*  CREATININE 1.40* 1.40*  --   --   TROPONINIHS 17  --  19*  --     Estimated Creatinine Clearance: 54.7 mL/min (A) (by C-G formula based on SCr of 1.4 mg/dL (H)).   Assessment: Patient is a 72 y.o F who presented to the ED on 09/09/23 with c/o CP and SOB.  Chest CT resulted back with acute bilateral PE with evidence of RHS.  Pharmacy has been consulted to dose heparin drip for  VTE treatment.  Today, 09/09/2023: - cbc ok - no bleeding documented  - scr 1.40  2021 HL 0.2 subtherapeutic on 1750 units/hr No bleeding noted  Goal of Therapy:  Heparin level 0.3-0.7 units/ml Monitor platelets by anticoagulation protocol: Yes   Plan:  - heparin 1500 units IV x1 bolus, then increase drip to 1950 units/hr - check 8 hr heparin level - monitor for s/sx bleeding   Arley Phenix RPh 09/09/2023, 9:33 PM

## 2023-09-09 NOTE — Progress Notes (Signed)
ANTICOAGULATION CONSULT NOTE   Pharmacy Consult for heparin  Indication: acute pulmonary embolus  Allergies  Allergen Reactions   Crestor [Rosuvastatin]     4 prior statins, crestor, lipitor, livalo, bad cramping   Other Other (See Comments)    NO Blood Transfusions, or giving blood   Sulfa Antibiotics Rash    Patient Measurements: Height: 5\' 6"  (167.6 cm) Weight: (!) 139.3 kg (307 lb) IBW/kg (Calculated) : 59.3 Heparin Dosing Weight: 94 kg  Vital Signs: Temp: 98.5 F (36.9 C) (09/22 1015) Temp Source: Oral (09/22 1015) BP: 111/66 (09/22 1135) Pulse Rate: 103 (09/22 1135)  Labs: Recent Labs    09/09/23 1103 09/09/23 1218  HGB 10.7* 11.9*  HCT 34.6* 35.0*  PLT 293  --   CREATININE 1.40* 1.40*  TROPONINIHS 17  --     Estimated Creatinine Clearance: 54.7 mL/min (A) (by C-G formula based on SCr of 1.4 mg/dL (H)).   Assessment: Patient is a 72 y.o F who presented to the ED on 09/09/23 with c/o CP and SOB.  Chest CT resulted back with acute bilateral PE with evidence of RHS.  Pharmacy has been consulted to dose heparin drip for  VTE treatment.  Today, 09/09/2023: - cbc ok - no bleeding documented  - scr 1.40  Goal of Therapy:  Heparin level 0.3-0.7 units/ml Monitor platelets by anticoagulation protocol: Yes   Plan:  - heparin 3000 units IV x1 bolus, then drip at 1750 units/hr - check 6 hr heparin level - monitor for s/sx bleeding   [name] P 09/09/2023,12:56 PM

## 2023-09-09 NOTE — ED Notes (Signed)
Patient transported to CT 

## 2023-09-09 NOTE — ED Notes (Signed)
ED TO INPATIENT HANDOFF REPORT  Name/Age/Gender [name] 72 y.o. female  Code Status Code Status History     Date Active Date Inactive Code Status Order ID Comments User Context   08/21/2017 1649 08/24/2017 1654 Full Code [id number]  Kathryne Hitch, MD Inpatient   02/06/2017 2023 02/09/2017 1841 Full Code [id number]  Kathryne Hitch, MD Inpatient       Home/SNF/Other Home  Chief Complaint Bilateral pulmonary embolism [facility]) [I26.99]  Level of Care/Admitting Diagnosis ED Disposition     ED Disposition  Admit   Condition  --   Comment  Hospital Area: [facility] [[id number]]  Level of Care: Progressive [102]  Admit to Progressive based on following criteria: CARDIOVASCULAR & THORACIC of moderate stability with acute coronary syndrome symptoms/low risk myocardial infarction/hypertensive urgency/arrhythmias/heart failure potentially compromising stability and stable post cardiovascular intervention patients.  Admit to Progressive based on following criteria: RESPIRATORY PROBLEMS hypoxemic/hypercapnic respiratory failure that is responsive to NIPPV (BiPAP) or High Flow Nasal Cannula (6-80 lpm). Frequent assessment/intervention, no > Q2 hrs < Q4 hrs, to maintain oxygenation and pulmonary hygiene.  May place patient in observation at [facility] or [facility] if equivalent level of care is available:: No  Covid Evaluation: Asymptomatic - no recent exposure (last 10 days) testing not required  Diagnosis: Bilateral pulmonary embolism [facility]) [[id number]]  Admitting Physician: Bobette Mo [[id number]]  Attending Physician: Bobette Mo [[id number]]          Medical History Past Medical History:  Diagnosis Date   Abdominal hernia    Acute blood loss as cause of postoperative anemia 02/16/2017   Arthritis    knees, back, R ankle    Bilateral carpal tunnel syndrome 04/29/2012   Diabetes mellitus    Diabetes mellitus type 2 in obese 12/31/2015    Edema    legs   GERD (gastroesophageal reflux disease)    HOH (hard of hearing)    Hypertension    Hypertriglyceridemia 08/30/2015   Started on medication 08/30/15   Lipoma 09/30/2012   Obesity    morbid obesity   OBESITY, MORBID 10/16/2007   Qualifier: Diagnosis of  By: Abby Potash     Osteoarthrosis involving lower leg 10/16/2007   Qualifier: Diagnosis of  By: Abby Potash     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   Status post total right knee replacement 08/21/2017   Subclinical hypothyroidism 10/11/2016   Tachycardia 08/22/2011   Vertigo    Vitamin D deficiency 02/16/2017    Allergies Allergies  Allergen Reactions   Crestor [Rosuvastatin]     4 prior statins, crestor, lipitor, livalo, bad cramping   Other Other (See Comments)    NO Blood Transfusions, or giving blood   Sulfa Antibiotics Rash    IV Location/Drains/Wounds Patient Lines/Drains/Airways Status     Active Line/Drains/Airways     Name Placement date Placement time Site Days   Peripheral IV 09/09/23 20 G 1" Left Antecubital 09/09/23  1005  Antecubital  less than 1   Closed System Drain 1 Left;Dorsal Other (Comment) 10 Fr. 07/10/14  1423  Other (Comment)  3348            Labs/Imaging Results for orders placed or performed during the hospital encounter of 09/09/23 (from the past 48 hour(s))  CBC with Differential/Platelet     Status: Abnormal   Collection Time: 09/09/23 11:03 AM  Result Value Ref Range   WBC 9.8 4.0 - 10.5 K/uL   RBC 3.63 (  L) 3.87 - 5.11 MIL/uL   Hemoglobin 10.7 (L) 12.0 - 15.0 g/dL   HCT 30.8 (L) 65.7 - 84.6 %   MCV 95.3 80.0 - 100.0 fL   MCH 29.5 26.0 - 34.0 pg   MCHC 30.9 30.0 - 36.0 g/dL   RDW 96.2 95.2 - 84.1 %   Platelets 293 150 - 400 K/uL   nRBC 0.0 0.0 - 0.2 %   Neutrophils Relative % 80 %   Neutro Abs 7.8 (H) 1.7 - 7.7 K/uL   Lymphocytes Relative 14 %   Lymphs Abs 1.4 0.7 - 4.0 K/uL   Monocytes Relative 6 %   Monocytes Absolute 0.5 0.1 - 1.0 K/uL    Eosinophils Relative 0 %   Eosinophils Absolute 0.0 0.0 - 0.5 K/uL   Basophils Relative 0 %   Basophils Absolute 0.0 0.0 - 0.1 K/uL   Immature Granulocytes 0 %   Abs Immature Granulocytes 0.04 0.00 - 0.07 K/uL    Comment: Performed at [facility], [address] [place], [place] [address]  Comprehensive metabolic panel     Status: Abnormal   Collection Time: 09/09/23 11:03 AM  Result Value Ref Range   Sodium 137 135 - 145 mmol/L   Potassium 4.4 3.5 - 5.1 mmol/L   Chloride 104 98 - 111 mmol/L   CO2 22 22 - 32 mmol/L   Glucose, Bld 237 (H) 70 - 99 mg/dL    Comment: Glucose reference range applies only to samples taken after fasting for at least 8 hours.   BUN 30 (H) 8 - 23 mg/dL   Creatinine, Ser 1.02 (H) 0.44 - 1.00 mg/dL   Calcium 8.8 (L) 8.9 - 10.3 mg/dL   Total Protein 7.2 6.5 - 8.1 g/dL   Albumin 3.4 (L) 3.5 - 5.0 g/dL   AST 20 15 - 41 U/L   ALT 18 0 - 44 U/L   Alkaline Phosphatase 56 38 - 126 U/L   Total Bilirubin 0.8 0.3 - 1.2 mg/dL   GFR, Estimated 41 (L) >60 mL/min    Comment: (NOTE) Calculated using the CKD-EPI Creatinine Equation (2021)    Anion gap 11 5 - 15    Comment: Performed at [facility], [address], [place], [place] [address]  Troponin I (High Sensitivity)     Status: None   Collection Time: 09/09/23 11:03 AM  Result Value Ref Range   Troponin I (High Sensitivity) 17 <18 ng/L    Comment: (NOTE) Elevated high sensitivity troponin I (hsTnI) values and significant  changes across serial measurements may suggest ACS but many other  chronic and acute conditions are known to elevate hsTnI results.  Refer to the "Links" section for chest pain algorithms and additional  guidance. Performed at [facility], [address], [place], [place] [address]   D-dimer, quantitative     Status: Abnormal   Collection Time: 09/09/23 11:03 AM  Result Value Ref Range   D-Dimer, Quant 2.13 (H) 0.00 - 0.50  ug/mL-FEU    Comment: (NOTE) At the manufacturer cut-off value of 0.5 g/mL FEU, this assay has a negative predictive value of 95-100%.This assay is intended for use in conjunction with a clinical pretest probability (PTP) assessment model to exclude pulmonary embolism (PE) and deep venous thrombosis (DVT) in outpatients suspected of PE or DVT. Results should be correlated with clinical presentation. Performed at [facility], [address] [place], [place] [address]   Brain natriuretic peptide  Status: Abnormal   Collection Time: 09/09/23 11:03 AM  Result Value Ref Range   B Natriuretic Peptide 167.4 (H) 0.0 - 100.0 pg/mL    Comment: Performed at [facility], [address], [place], [place] [address]  I-stat chem 8, ed     Status: Abnormal   Collection Time: 09/09/23 12:18 PM  Result Value Ref Range   Sodium 139 135 - 145 mmol/L   Potassium 3.7 3.5 - 5.1 mmol/L   Chloride 106 98 - 111 mmol/L   BUN 26 (H) 8 - 23 mg/dL   Creatinine, Ser 4.40 (H) 0.44 - 1.00 mg/dL   Glucose, Bld 102 (H) 70 - 99 mg/dL    Comment: Glucose reference range applies only to samples taken after fasting for at least 8 hours.   Calcium, Ion 1.14 (L) 1.15 - 1.40 mmol/L   TCO2 21 (L) 22 - 32 mmol/L   Hemoglobin 11.9 (L) 12.0 - 15.0 g/dL   HCT 72.5 (L) 36.6 - 44.0 %   CT Angio Chest PE W/Cm &/Or Wo Cm  Result Date: 09/09/2023 CLINICAL DATA:  Shortness of breath and chest pain beginning yesterday. High probability for pulmonary embolism. EXAM: CT ANGIOGRAPHY CHEST WITH CONTRAST TECHNIQUE: Multidetector CT imaging of the chest was performed using the standard protocol during bolus administration of intravenous contrast. Multiplanar CT image reconstructions and MIPs were obtained to evaluate the vascular anatomy. RADIATION DOSE REDUCTION: This exam was performed according to the departmental dose-optimization program which includes automated exposure control, adjustment of  the mA and/or kV according to patient size and/or use of iterative reconstruction technique. CONTRAST:  75mL OMNIPAQUE IOHEXOL 350 MG/ML SOLN COMPARISON:  08/02/2011 FINDINGS: Cardiovascular: Borderline cardiomegaly. Thoracic aorta is normal in caliber. Minimal calcified plaque over the descending thoracic aorta. Pulmonary arterial system is well opacified and demonstrates moderate burden of pulmonary emboli on the right side involving the distal aspect of the main pulmonary artery and proximal upper and lower lobar pulmonary arteries. There also emboli over the segmental and subsegmental left upper and lower lobar pulmonary arteries. Elevated RV/LV ratio greater than 0.9. Mediastinum/Nodes: 2 enlarged pre-vascular space base lymph nodes with the larger measuring 1.2 cm by short axis. No additional evidence of mediastinal or hilar adenopathy. 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 The thyroid gland is partially obscured by artifact from the contrast bolus. 3 cm hypodense nodule over the left lobe. Remaining mediastinal structures are unremarkable. Lungs/Pleura: Lungs are adequately inflated. Subtle patchy hazy opacification over the left lower lobe and subtle hazy ground-glass attenuation over the left upper lobe likely related to patient's acute emboli. No effusion. Airways are normal. Upper Abdomen: No acute findings. Musculoskeletal: Degenerative changes of the spine. No focal abnormality. Review of the MIP images confirms the above findings. IMPRESSION: 1. Moderate burden of acute pulmonary emboli bilaterally as described with evidence of right heart strain. 2. Subtle patchy hazy opacification over the left lower lobe and subtle hazy ground-glass attenuation over the left upper lobe likely related to patient's acute emboli. 3. 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 3 cm hypodense nodule over the left lobe. Recommend non-emergent  thyroid ultrasound. Reference: J Am Coll Radiol. 2015 Feb;12(2): 143-50. 4. Aortic atherosclerosis. Aortic Atherosclerosis (ICD10-I70.0). Critical Value/emergent results were called by telephone at the time of interpretation on 09/09/2023 at 12:44 p.m. to provider Lorre Nick , who verbally acknowledged these results. Electronically Signed   By: Elberta Fortis M.D.  On: 09/09/2023 12:52   DG Chest Port 1 View  Result Date: 09/09/2023 CLINICAL DATA:  Chest pain and shortness of breath. Cough. EXAM: PORTABLE CHEST 1 VIEW COMPARISON:  Two-view chest x-ray 05/13/2019 FINDINGS: The heart size is exaggerated by low lung volumes. No edema or effusion is present. No focal airspace disease is present. The visualized soft tissues and bony thorax are unremarkable. IMPRESSION: 1. Low lung volumes. 2. No acute cardiopulmonary disease. Electronically Signed   By: Marin Roberts M.D.   On: 09/09/2023 11:46    Pending Labs Unresulted Labs (From admission, onward)     Start     Ordered   09/10/23 0500  CBC  Daily,   R      09/09/23 1302   09/09/23 2000  Heparin level (unfractionated)  Once-Timed,   URGENT        09/09/23 1302            Vitals/Pain Today's Vitals   09/09/23 1015 09/09/23 1027 09/09/23 1034 09/09/23 1135  BP: 115/72   111/66  Pulse: 93   (!) 103  Resp: 20   (!) 21  Temp: 98.5 F (36.9 C)     TempSrc: Oral     SpO2: 92%  92% 94%  Weight:  (!) 139.3 kg    Height:  5\' 6"  (1.676 m)    PainSc:  6  6      Isolation Precautions No active isolations  Medications Medications  lactated ringers infusion ( Intravenous New Bag/Given 09/09/23 1126)  heparin bolus via infusion 3,000 Units (has no administration in time range)  heparin ADULT infusion 100 units/mL (25000 units/226mL) (has no administration in time range)  iohexol (OMNIPAQUE) 350 MG/ML injection 75 mL (75 mLs Intravenous Contrast Given 09/09/23 1215)    Mobility walks

## 2023-09-09 NOTE — ED Triage Notes (Signed)
Patient brought in from home by EMS with c/o SOB and chest pain. Per patient discomfort started yesterday, EMS stated patient O2 at arrival was 88% they placed her on 3L [place] and O2 increased to 94%. EMS gave 324mg  of ASA and of NS in 20g in L arm. Patient has not taken any daily meds.  111/55 94% 3L 110 CBG:251

## 2023-09-09 NOTE — Progress Notes (Signed)
Pt arrived to unit from ED on stretcher. Stand/pivot to floor bed w/ assist. VSS on 2lnc. Pt oriented to callbell and environment. POC discussed w/ pt and family at bedside. Pt sitting up on eob visiting w/ family. No c/o at present.

## 2023-09-09 NOTE — ED Provider Notes (Signed)
[place] EMERGENCY DEPARTMENT AT [facility] Provider Note   CSN: [id number] Arrival date & time: 09/09/23  2536     History  Chief Complaint  Patient presents with   Chest Pain   Shortness of Breath    [name] is a 72 y.o. female.  72 year old female presents with dyspnea on exertion as well as orthopnea x 24 hours.  Has had chest tightness associated with coughing.  No fever or chills.  No nausea or vomiting.  Denies any prior history of cardiac disease.  Does have a history of bronchitis and has used her home inhaler with limited relief.  Called EMS and according to their notes her O2 sat on arrival was 80%.  They placed on 3 L nasal cannula and increased to 94%.  Patient was given aspirin and saline.  Her CBG was 251.  Transferred here for further evaluation       Home Medications Prior to Admission medications   Medication Sig Start Date End Date Taking? Authorizing Provider  Accu-Chek Softclix Lancets lancets TEST 1 TO 2 TIMES DAILY 09/20/20   Henson, Vickie L, NP-C  albuterol (VENTOLIN HFA) 108 (90 Base) MCG/ACT inhaler INHALE 2 PUFFS INTO THE LUNGS EVERY 6 HOURS AS NEEDED FOR WHEEZING OR SHORTNESS OF BREATH 11/15/22   Tysinger, Kermit Balo, PA-C  allopurinol (ZYLOPRIM) 100 MG tablet TAKE 1 TABLET BY MOUTH DAILY 08/14/23   Tysinger, Kermit Balo, PA-C  Blood Glucose Monitoring Suppl (SOLUS V2 BLOOD GLUCOSE SYSTEM) DEVI Test 1-2 times a day Dx E11.9 08/10/20   Henson, Vickie L, NP-C  Cyanocobalamin (VITAMIN B-12 PO) Take 1 tablet by mouth daily at 2 PM.    [provider]  fenofibrate (TRICOR) 145 MG tablet Take 1 tablet (145 mg total) by mouth daily. 10/03/22   Tysinger, Kermit Balo, PA-C  glucose blood (ACCU-CHEK GUIDE) test strip TEST 1 TO 2 TIMES DAILY 12/13/21   Ronnald Nian, MD  losartan-hydrochlorothiazide [facility]) 100-12.5 MG tablet TAKE 1 TABLET BY MOUTH DAILY 08/02/23   Tysinger, Kermit Balo, PA-C  MAGNESIUM CITRATE PO Take 1 tablet by mouth daily as  needed.    [provider]  metFORMIN (GLUCOPHAGE) 1000 MG tablet TAKE 1 TABLET BY MOUTH TWICE  DAILY WITH MEALS 07/03/23   Tysinger, Kermit Balo, PA-C  Multiple Vitamin (MULTIVITAMIN) tablet Take 1 tablet by mouth daily.    [provider]  tirzepatide Greggory Keen) 10 MG/0.5ML Pen Inject 10 mg into the skin once a week. 08/15/23   Tysinger, Kermit Balo, PA-C      Allergies    Crestor [rosuvastatin], Other, and Sulfa antibiotics    Review of Systems   Review of Systems  All other systems reviewed and are negative.   Physical Exam Updated Vital Signs BP 115/72 (BP Location: Left Arm)   Pulse 93   Temp 98.5 F (36.9 C) (Oral)   Resp 20   Ht 1.676 m (5\' 6" )   Wt (!) 139.3 kg   LMP  (LMP Unknown)   SpO2 92%   BMI 49.55 kg/m  Physical Exam Vitals and nursing note reviewed.  Constitutional:      General: She is not in acute distress.    Appearance: Normal appearance. She is well-developed. She is not toxic-appearing.  HENT:     Head: Normocephalic and atraumatic.  Eyes:     General: Lids are normal.     Conjunctiva/sclera: Conjunctivae normal.     Pupils: Pupils are equal, round, and reactive to  light.  Neck:     Thyroid: No thyroid mass.     Trachea: No tracheal deviation.  Cardiovascular:     Rate and Rhythm: Regular rhythm. Tachycardia present.     Heart sounds: Normal heart sounds. No murmur heard.    No gallop.  Pulmonary:     Effort: Pulmonary effort is normal. No respiratory distress.     Breath sounds: No stridor. Decreased breath sounds present. No wheezing, rhonchi or rales.  Abdominal:     General: There is no distension.     Palpations: Abdomen is soft.     Tenderness: There is no abdominal tenderness. There is no rebound.  Musculoskeletal:        General: No tenderness. Normal range of motion.     Cervical back: Normal range of motion and neck supple.  Skin:    General: Skin is warm and dry.     Findings: No abrasion or rash.  Neurological:      Mental Status: She is alert and oriented to person, place, and time. Mental status is at baseline.     GCS: GCS eye subscore is 4. GCS verbal subscore is 5. GCS motor subscore is 6.     Cranial Nerves: Cranial nerves are intact. No cranial nerve deficit.     Sensory: No sensory deficit.     Motor: Motor function is intact.  Psychiatric:        Attention and Perception: Attention normal.        Speech: Speech normal.        Behavior: Behavior normal.     ED Results / Procedures / Treatments   Labs (all labs ordered are listed, but only abnormal results are displayed) Labs Reviewed  CBC WITH DIFFERENTIAL/PLATELET  COMPREHENSIVE METABOLIC PANEL  D-DIMER, QUANTITATIVE  BRAIN NATRIURETIC PEPTIDE  TROPONIN I (HIGH SENSITIVITY)    EKG EKG Interpretation Date/Time:  Sunday September 09 2023 10:27:40 EDT Ventricular Rate:  107 PR Interval:  154 QRS Duration:  88 QT Interval:  364 QTC Calculation: 486 R Axis:   16  Text Interpretation: Sinus tachycardia Low voltage, extremity and precordial leads Borderline prolonged QT interval Confirmed by Lorre Nick ([id number]) on 09/09/2023 11:03:03 AM  Radiology No results found.  Procedures Procedures    Medications Ordered in ED Medications  lactated ringers infusion (has no administration in time range)    ED Course/ Medical Decision Making/ A&P                                 Medical Decision Making Amount and/or Complexity of Data Reviewed Labs: ordered. Radiology: ordered. ECG/medicine tests: ordered.  Risk Prescription drug management.   Patient is EKG shows sinus tachycardia.  This is per my interpretation.  Chest x-ray per interpretation shows no acute findings.  Labs significant for elevated D-dimer.  Patient subsequently had CT chest angio which does show bilateral PEs.  Patient heparinized per pharmacy.  Troponin negative.  Discussed results with family at bedside.  Discussed with hospitalist team and they will  admit  CRITICAL CARE Performed by: Toy Baker Total critical care time: 50 minutes Critical care time was exclusive of separately billable procedures and treating other patients. Critical care was necessary to treat or prevent imminent or life-threatening deterioration. Critical care was time spent personally by me on the following activities: development of treatment plan with patient and/or surrogate as well as nursing, discussions with consultants, evaluation  of patient's response to treatment, examination of patient, obtaining history from patient or surrogate, ordering and performing treatments and interventions, ordering and review of laboratory studies, ordering and review of radiographic studies, pulse oximetry and re-evaluation of patient's condition.         Final Clinical Impression(s) / ED Diagnoses Final diagnoses:  None    Rx / DC Orders ED Discharge Orders     None         Lorre Nick, MD 09/09/23 1310

## 2023-09-10 ENCOUNTER — Observation Stay (HOSPITAL_COMMUNITY): Payer: 59

## 2023-09-10 DIAGNOSIS — Z86711 Personal history of pulmonary embolism: Secondary | ICD-10-CM

## 2023-09-10 DIAGNOSIS — E785 Hyperlipidemia, unspecified: Secondary | ICD-10-CM

## 2023-09-10 DIAGNOSIS — Z7984 Long term (current) use of oral hypoglycemic drugs: Secondary | ICD-10-CM | POA: Diagnosis not present

## 2023-09-10 DIAGNOSIS — Z833 Family history of diabetes mellitus: Secondary | ICD-10-CM | POA: Diagnosis not present

## 2023-09-10 DIAGNOSIS — Z882 Allergy status to sulfonamides status: Secondary | ICD-10-CM | POA: Diagnosis not present

## 2023-09-10 DIAGNOSIS — E782 Mixed hyperlipidemia: Secondary | ICD-10-CM | POA: Diagnosis not present

## 2023-09-10 DIAGNOSIS — I152 Hypertension secondary to endocrine disorders: Secondary | ICD-10-CM

## 2023-09-10 DIAGNOSIS — Z888 Allergy status to other drugs, medicaments and biological substances status: Secondary | ICD-10-CM | POA: Diagnosis not present

## 2023-09-10 DIAGNOSIS — K219 Gastro-esophageal reflux disease without esophagitis: Secondary | ICD-10-CM

## 2023-09-10 DIAGNOSIS — E038 Other specified hypothyroidism: Secondary | ICD-10-CM

## 2023-09-10 DIAGNOSIS — Z7985 Long-term (current) use of injectable non-insulin antidiabetic drugs: Secondary | ICD-10-CM | POA: Diagnosis not present

## 2023-09-10 DIAGNOSIS — I2699 Other pulmonary embolism without acute cor pulmonale: Secondary | ICD-10-CM | POA: Diagnosis present

## 2023-09-10 DIAGNOSIS — E049 Nontoxic goiter, unspecified: Secondary | ICD-10-CM | POA: Diagnosis present

## 2023-09-10 DIAGNOSIS — D649 Anemia, unspecified: Secondary | ICD-10-CM

## 2023-09-10 DIAGNOSIS — I2609 Other pulmonary embolism with acute cor pulmonale: Secondary | ICD-10-CM

## 2023-09-10 DIAGNOSIS — E1159 Type 2 diabetes mellitus with other circulatory complications: Secondary | ICD-10-CM | POA: Diagnosis not present

## 2023-09-10 DIAGNOSIS — Z6841 Body Mass Index (BMI) 40.0 and over, adult: Secondary | ICD-10-CM | POA: Diagnosis not present

## 2023-09-10 DIAGNOSIS — E1121 Type 2 diabetes mellitus with diabetic nephropathy: Secondary | ICD-10-CM | POA: Diagnosis not present

## 2023-09-10 DIAGNOSIS — N1831 Chronic kidney disease, stage 3a: Secondary | ICD-10-CM | POA: Diagnosis not present

## 2023-09-10 DIAGNOSIS — E1169 Type 2 diabetes mellitus with other specified complication: Secondary | ICD-10-CM

## 2023-09-10 DIAGNOSIS — Z7901 Long term (current) use of anticoagulants: Secondary | ICD-10-CM | POA: Diagnosis not present

## 2023-09-10 DIAGNOSIS — Z841 Family history of disorders of kidney and ureter: Secondary | ICD-10-CM | POA: Diagnosis not present

## 2023-09-10 DIAGNOSIS — Z96653 Presence of artificial knee joint, bilateral: Secondary | ICD-10-CM | POA: Diagnosis present

## 2023-09-10 DIAGNOSIS — E1122 Type 2 diabetes mellitus with diabetic chronic kidney disease: Secondary | ICD-10-CM | POA: Diagnosis present

## 2023-09-10 DIAGNOSIS — Z8249 Family history of ischemic heart disease and other diseases of the circulatory system: Secondary | ICD-10-CM | POA: Diagnosis not present

## 2023-09-10 DIAGNOSIS — I82452 Acute embolism and thrombosis of left peroneal vein: Secondary | ICD-10-CM | POA: Diagnosis present

## 2023-09-10 DIAGNOSIS — D631 Anemia in chronic kidney disease: Secondary | ICD-10-CM | POA: Diagnosis present

## 2023-09-10 DIAGNOSIS — Z79899 Other long term (current) drug therapy: Secondary | ICD-10-CM | POA: Diagnosis not present

## 2023-09-10 LAB — ECHOCARDIOGRAM COMPLETE
AR max vel: 1.89 cm2
AV Area VTI: 2.01 cm2
AV Area mean vel: 1.76 cm2
AV Mean grad: 5 mmHg
AV Peak grad: 8.5 mmHg
Ao pk vel: 1.46 m/s
Height: 66 in
S' Lateral: 2.2 cm
Weight: 4912 oz

## 2023-09-10 LAB — CBC
HCT: 34.8 % — ABNORMAL LOW (ref 36.0–46.0)
Hemoglobin: 10.4 g/dL — ABNORMAL LOW (ref 12.0–15.0)
MCH: 29.1 pg (ref 26.0–34.0)
MCHC: 29.9 g/dL — ABNORMAL LOW (ref 30.0–36.0)
MCV: 97.5 fL (ref 80.0–100.0)
Platelets: 281 10*3/uL (ref 150–400)
RBC: 3.57 MIL/uL — ABNORMAL LOW (ref 3.87–5.11)
RDW: 14.6 % (ref 11.5–15.5)
WBC: 8.8 10*3/uL (ref 4.0–10.5)
nRBC: 0 % (ref 0.0–0.2)

## 2023-09-10 LAB — COMPREHENSIVE METABOLIC PANEL
ALT: 17 U/L (ref 0–44)
AST: 12 U/L — ABNORMAL LOW (ref 15–41)
Albumin: 3.2 g/dL — ABNORMAL LOW (ref 3.5–5.0)
Alkaline Phosphatase: 48 U/L (ref 38–126)
Anion gap: 10 (ref 5–15)
BUN: 26 mg/dL — ABNORMAL HIGH (ref 8–23)
CO2: 23 mmol/L (ref 22–32)
Calcium: 8.7 mg/dL — ABNORMAL LOW (ref 8.9–10.3)
Chloride: 103 mmol/L (ref 98–111)
Creatinine, Ser: 1.44 mg/dL — ABNORMAL HIGH (ref 0.44–1.00)
GFR, Estimated: 39 mL/min — ABNORMAL LOW (ref >60)
Glucose, Bld: 193 mg/dL — ABNORMAL HIGH (ref 70–99)
Potassium: 3.9 mmol/L (ref 3.5–5.1)
Sodium: 136 mmol/L (ref 135–145)
Total Bilirubin: 0.6 mg/dL (ref 0.3–1.2)
Total Protein: 6.7 g/dL (ref 6.5–8.1)

## 2023-09-10 LAB — HEPARIN LEVEL (UNFRACTIONATED)
Heparin Unfractionated: 0.51 IU/mL (ref 0.30–0.70)
Heparin Unfractionated: 0.35 IU/mL (ref 0.30–0.70)

## 2023-09-10 LAB — GLUCOSE, CAPILLARY
Glucose-Capillary: 225 mg/dL — ABNORMAL HIGH (ref 70–99)
Glucose-Capillary: 176 mg/dL — ABNORMAL HIGH (ref 70–99)
Glucose-Capillary: 150 mg/dL — ABNORMAL HIGH (ref 70–99)
Glucose-Capillary: 142 mg/dL — ABNORMAL HIGH (ref 70–99)
Glucose-Capillary: 145 mg/dL — ABNORMAL HIGH (ref 70–99)

## 2023-09-10 LAB — HIV ANTIBODY (ROUTINE TESTING W REFLEX): HIV Screen 4th Generation wRfx: NONREACTIVE

## 2023-09-10 MED ORDER — INSULIN GLARGINE-YFGN 100 UNIT/ML ~~LOC~~ SOLN
15.0000 [IU] | Freq: Every day | SUBCUTANEOUS | Status: DC
  Administered 2023-09-10 – 2023-09-11 (×2): 15 [IU] via SUBCUTANEOUS
  Filled 2023-09-10 (×2): qty 0.15

## 2023-09-10 MED ORDER — INSULIN ASPART 100 UNIT/ML IJ SOLN
4.0000 [IU] | Freq: Three times a day (TID) | INTRAMUSCULAR | Status: DC
  Administered 2023-09-10 – 2023-09-11 (×2): 4 [IU] via SUBCUTANEOUS

## 2023-09-10 NOTE — TOC CM/SW Note (Addendum)
Transition of Care [facility]) - Inpatient Brief Assessment   Patient Details  Name: [name] MRN: [id number] Date of Birth: 05-Mar-1951  Transition of [facility] CM/SW Contact:    Howell Rucks, RN Phone Number: 09/10/2023, 10:47 AM   Clinical Narrative: Met with pt at bedside to introduce role of TOC/NCM and review for dc planning. Pt reports she has an established PCP and pharmacy,  reports no current home care services, reports she has a Youth worker, shower chair, potty chair at home she uses as needed. Pt reports she feels safe returning him with support from her family, confirmed she has transporation available at discharge. TOC Brief Assessment completed. MOON explained, will monitor for change in admit status.  No TOC needs identified at this time.      Transition of Care Asessment: Insurance and Status: Insurance coverage has been reviewed Patient has primary care physician: Yes Home environment has been reviewed: resides alonei in an apartment with support from her family Prior level of function:: Independent with walker/cane as needed Prior/Current Home Services: No current home services Social Determinants of Health Reivew: SDOH reviewed no interventions necessary Readmission risk has been reviewed: Yes Transition of care needs: no transition of care needs at this time

## 2023-09-10 NOTE — Progress Notes (Signed)
ANTICOAGULATION CONSULT NOTE - Follow Up Consult  Pharmacy Consult for Heparin Indication: pulmonary embolus  Allergies  Allergen Reactions   Crestor [Rosuvastatin]     4 prior statins, crestor, lipitor, livalo, bad cramping   Other Other (See Comments)    NO Blood Transfusions, or giving blood   Sulfa Antibiotics Rash    Patient Measurements: Height: 5\' 6"  (167.6 cm) Weight: (!) 139.3 kg (307 lb) IBW/kg (Calculated) : 59.3 Heparin Dosing Weight:  94 kg  Vital Signs: Temp: 98.5 F (36.9 C) (09/23 1152) Temp Source: Oral (09/23 1152) BP: 113/69 (09/23 1152) Pulse Rate: 96 (09/23 1152)  Labs: Recent Labs    09/09/23 1103 09/09/23 1218 09/09/23 1311 09/09/23 2021 09/10/23 0414 09/10/23 0801 09/10/23 1343  HGB 10.7* 11.9*  --   --  10.4*  --   --   HCT 34.6* 35.0*  --   --  34.8*  --   --   PLT 293  --   --   --  281  --   --   HEPARINUNFRC  --   --   --  0.20*  --  0.51 0.35  CREATININE 1.40* 1.40*  --   --  1.44*  --   --   TROPONINIHS 17  --  19*  --   --   --   --     Estimated Creatinine Clearance: 53.1 mL/min (A) (by C-G formula based on SCr of 1.44 mg/dL (H)).   Assessment: AC/Heme: heparin for acute BL PE with RHS  - 9/22: Ddimer 2.13 - 9/22 chest CT: Moderate burden of acute pulmonary emboli bilaterally as described with evidence of right heart strain. - 9/23: HL 0.51, confirmatory 0.35 in goal  Goal of Therapy:  Heparin level 0.3-0.7 units/ml Monitor platelets by anticoagulation protocol: Yes   Plan:  IV heparin 1950 units/hr Daily HL and CBC   Boyd Litaker S. Merilynn Finland, PharmD, BCPS Clinical Staff Pharmacist [email or web address] Merilynn Finland, Beryle Zeitz Stillinger 09/10/2023,3:20 PM

## 2023-09-10 NOTE — Progress Notes (Signed)
PROGRESS NOTE  [name] ZOX:[id number] DOB: [birth year]   PCP: [name], PA-C  Patient is from: Home.  Lives alone.  Independently ambulates at baseline.  DOA: 09/09/2023 LOS: 0  Chief complaints Chief Complaint  Patient presents with   Chest Pain   Shortness of Breath     Brief Narrative / Interim history: 72 year old F with PMH of DM-2, CKD-3A, HTN, morbid obesity, osteoarthritis, b/l TKA, goiter and chronic edema presenting with increased shortness of breath and pleuritic chest pain for 2 days, and found to have bilateral acute pulmonary embolism with radiologic evidence of right heart strain.  She was admitted and started on IV heparin.  No recent travel, significant immobility or surgery.  No prior history of blood clot or cancer.  Not on HRT.  Denies smoking cigarettes.  TTE and lower extremity venous Doppler ordered.     Subjective: Seen and examined earlier this morning.  No major events overnight of this morning.  Feels better other than cough and some headache.  Denies leg pain or calf tenderness other than chronic BLE edema.  She says she is up-to-date on age-appropriate cancer screening including colonoscopy and mammogram.  Has not had a Pap smear recently.  Objective: Vitals:   09/09/23 1546 09/09/23 2049 09/10/23 0542 09/10/23 1152  BP: 134/85 106/60 107/74 113/69  Pulse:  (!) 104 (!) 107 96  Resp: 20 17 18 18   Temp: 97.8 F (36.6 C) 99.1 F (37.3 C) 97.9 F (36.6 C) 98.5 F (36.9 C)  TempSrc: Oral Oral Oral Oral  SpO2: 93% 93% 94% 95%  Weight:      Height:        Examination:  GENERAL: No apparent distress.  Nontoxic. HEENT: MMM.  Vision and hearing grossly intact.  NECK: Supple.  No apparent JVD.  RESP:  No IWOB.  Fair aeration bilaterally. CVS:  RRR. Heart sounds normal.  ABD/GI/GU: BS+. Abd soft, NTND.  MSK/EXT:  Moves extremities. No apparent deformity.  Trace BLE edema. SKIN: no apparent skin lesion or wound NEURO: Awake, alert and  oriented appropriately.  No apparent focal neuro deficit. PSYCH: Calm. Normal affect.   Procedures:  None  Microbiology summarized: None  Assessment and plan: Principal Problem:   Acute pulmonary embolism with acute cor pulmonale (HCC) Active Problems:   OBESITY, MORBID   Hypertension associated with diabetes (HCC)   Subclinical hypothyroidism   GERD (gastroesophageal reflux disease)   Hyperlipidemia associated with type 2 diabetes mellitus (HCC)   Chronic kidney disease   Type 2 diabetes mellitus with diabetic nephropathy (HCC)   Mixed hyperlipidemia   Normocytic anemia  Bilateral pulmonary embolism with acute cor pulmonale: Seems unprovoked.  CT angio with RV/LV ratio> 0.9.  Hemodynamically stable.  On 2 L by nasal cannula but no desaturation.  No significant respiratory distress other than cough.  Lower extremity venous Doppler with left peroneal vein DVT.  -Continue IV heparin -Follow echocardiogram -Can be transitioned to DOAC after echocardiogram results. -Wean oxygen and ambulate after transitioning -Discontinue IV fluid  NIDDM-2 with hyperglycemia, neuropathy and CKD-3A: A1c 8.6% in 07/2023. Recent Labs  Lab 09/09/23 1352 09/09/23 1602 09/09/23 2052 09/10/23 0811 09/10/23 1150  GLUCAP 197* 155* 225* 225* 176*  -Continue SSI-resistant scale -Add NovoLog 4 units 3 times daily with meals -Add Semglee 15 units daily   CKD-3A: Cr higher than baseline but not sure if this is acute or chronic.  She is on losartan/HCTZ Recent Labs    08/14/23 1540 09/09/23 1103  09/09/23 1218 09/10/23 0414  BUN 26 30* 26* 26*  CREATININE 1.18* 1.40* 1.40* 1.44*  -Continue holding losartan/HCTZ -Monitor off IV fluid -Aspirate intake and output    Hypertension associated with diabetes: Normotensive off home antihypertensive meds. -Discontinue IV fluid -Continue holding losartan/HCTZ  Normocytic anemia: Likely anemia of chronic disease.  Stable. Recent Labs    08/14/23 1540  09/09/23 1103 09/09/23 1218 09/10/23 0414  HGB 11.4 10.7* 11.9* 10.4*  -Monitor  Osteoarthritis history of bilateral TKA: Stable   Mixed hyperlipidemia -Continue fenofibrate 145 mg p.o. daily or formulary equivalent.   Subclinical hypothyroidism -Follow-up with PCP.   GERD Antiacid, H2 blocker or PPI as needed.   Morbid obesity Body mass index is 49.55 kg/m. -Encourage lifestyle change to lose weight         DVT prophylaxis:  On full dose anticoagulation  Code Status: Full code Family Communication: None at bedside Level of care: Telemetry Status is: Observation The patient will require care spanning > 2 midnights and should be moved to inpatient because: Acute pulmonary embolism with cor pulmonale   Final disposition: Home Consultants:  None  55 minutes with more than 50% spent in reviewing records, counseling patient/family and coordinating care.   Sch Meds:  Scheduled Meds:  allopurinol  100 mg Oral Daily   fenofibrate  160 mg Oral Daily   insulin aspart  0-20 Units Subcutaneous TID WC   insulin aspart  4 Units Subcutaneous TID WC   insulin glargine-yfgn  15 Units Subcutaneous Daily   [START ON 09/11/2023] metFORMIN  1,000 mg Oral BID WC   Continuous Infusions:  heparin 1,950 Units/hr (09/10/23 0224)   lactated ringers 125 mL/hr at 09/10/23 0726   PRN Meds:.acetaminophen **OR** acetaminophen, ondansetron **OR** ondansetron (ZOFRAN) IV  Antimicrobials: Anti-infectives (From admission, onward)    None        I have personally reviewed the following labs and images: CBC: Recent Labs  Lab 09/09/23 1103 09/09/23 1218 09/10/23 0414  WBC 9.8  --  8.8  NEUTROABS 7.8*  --   --   HGB 10.7* 11.9* 10.4*  HCT 34.6* 35.0* 34.8*  MCV 95.3  --  97.5  PLT 293  --  281   BMP &GFR Recent Labs  Lab 09/09/23 1103 09/09/23 1218 09/10/23 0414  NA 137 139 136  K 4.4 3.7 3.9  CL 104 106 103  CO2 22  --  23  GLUCOSE 237* 214* 193*  BUN 30* 26* 26*   CREATININE 1.40* 1.40* 1.44*  CALCIUM 8.8*  --  8.7*   Estimated Creatinine Clearance: 53.1 mL/min (A) (by C-G formula based on SCr of 1.44 mg/dL (H)). Liver & Pancreas: Recent Labs  Lab 09/09/23 1103 09/10/23 0414  AST 20 12*  ALT 18 17  ALKPHOS 56 48  BILITOT 0.8 0.6  PROT 7.2 6.7  ALBUMIN 3.4* 3.2*   No results for input(s): "LIPASE", "AMYLASE" in the last 168 hours. No results for input(s): "AMMONIA" in the last 168 hours. Diabetic: No results for input(s): "HGBA1C" in the last 72 hours. Recent Labs  Lab 09/09/23 1352 09/09/23 1602 09/09/23 2052 09/10/23 0811 09/10/23 1150  GLUCAP 197* 155* 225* 225* 176*   Cardiac Enzymes: No results for input(s): "CKTOTAL", "CKMB", "CKMBINDEX", "TROPONINI" in the last 168 hours. No results for input(s): "PROBNP" in the last 8760 hours. Coagulation Profile: No results for input(s): "INR", "PROTIME" in the last 168 hours. Thyroid Function Tests: No results for input(s): "TSH", "T4TOTAL", "FREET4", "T3FREE", "THYROIDAB" in the  last 72 hours. Lipid Profile: No results for input(s): "CHOL", "HDL", "LDLCALC", "TRIG", "CHOLHDL", "LDLDIRECT" in the last 72 hours. Anemia Panel: No results for input(s): "VITAMINB12", "FOLATE", "FERRITIN", "TIBC", "IRON", "RETICCTPCT" in the last 72 hours. Urine analysis:    Component Value Date/Time   COLORURINE YELLOW 07/02/2011 0923   APPEARANCEUR CLEAR 07/02/2011 0923   LABSPEC 1.030 02/16/2021 1511   PHURINE 5.5 07/02/2011 0923   GLUCOSEU NEGATIVE 07/02/2011 0923   HGBUR NEGATIVE 07/02/2011 0923   BILIRUBINUR negative 02/16/2021 1511   BILIRUBINUR n 08/27/2015 1045   KETONESUR negative 02/16/2021 1511   KETONESUR NEGATIVE 07/02/2011 0923   PROTEINUR trace (A) 02/16/2021 1511   PROTEINUR n 08/27/2015 1045   PROTEINUR NEGATIVE 07/02/2011 0923   UROBILINOGEN negative 08/27/2015 1045   UROBILINOGEN 0.2 07/02/2011 0923   NITRITE Negative 02/16/2021 1511   NITRITE n 08/27/2015 1045   NITRITE  NEGATIVE 07/02/2011 0923   LEUKOCYTESUR Negative 02/16/2021 1511   Sepsis Labs: Invalid input(s): "PROCALCITONIN", "LACTICIDVEN"  Microbiology: No results found for this or any previous visit (from the past 240 hour(s)).  Radiology Studies: VAS [place] LOWER EXTREMITY VENOUS (DVT)  Result Date: 09/10/2023  Lower Venous DVT Study Patient Name:  [name]  Date of Exam:   09/10/2023 Medical Rec #: [id number]       Accession #:    [id number] Date of Birth: 02-13-1951       Patient Gender: F Patient Age:   70 years Exam Location:  [facility] Procedure:      VAS [place] LOWER EXTREMITY VENOUS (DVT) Referring Phys: DAVID ORTIZ --------------------------------------------------------------------------------  Indications: Pulmonary embolism.  Limitations: Body habitus. Comparison Study: Previous exam on 08/25/2011 was negative for DVT. Performing Technologist: Ernestene Mention RVT, RDMS  Examination Guidelines: A complete evaluation includes B-mode imaging, spectral Doppler, color Doppler, and power Doppler as needed of all accessible portions of each vessel. Bilateral testing is considered an integral part of a complete examination. Limited examinations for reoccurring indications may be performed as noted. The reflux portion of the exam is performed with the patient in reverse Trendelenburg.  +---------+---------------+---------+-----------+----------+-------------------+ RIGHT    CompressibilityPhasicitySpontaneityPropertiesThrombus Aging      +---------+---------------+---------+-----------+----------+-------------------+ CFV      Full           Yes      Yes                                      +---------+---------------+---------+-----------+----------+-------------------+ SFJ      Full                                                             +---------+---------------+---------+-----------+----------+-------------------+ FV Prox  Full           Yes      Yes                                       +---------+---------------+---------+-----------+----------+-------------------+ FV Mid   Full           Yes      Yes                                      +---------+---------------+---------+-----------+----------+-------------------+  FV DistalFull           Yes      Yes                                      +---------+---------------+---------+-----------+----------+-------------------+ PFV      Full                                                             +---------+---------------+---------+-----------+----------+-------------------+ POP      Full           Yes      Yes                                      +---------+---------------+---------+-----------+----------+-------------------+ PTV      Full                                                             +---------+---------------+---------+-----------+----------+-------------------+ PERO     Full                                         Not well visualized +---------+---------------+---------+-----------+----------+-------------------+   +---------+---------------+---------+-----------+----------+--------------+ LEFT     CompressibilityPhasicitySpontaneityPropertiesThrombus Aging +---------+---------------+---------+-----------+----------+--------------+ CFV      Full           Yes      Yes                                 +---------+---------------+---------+-----------+----------+--------------+ SFJ      Full                                                        +---------+---------------+---------+-----------+----------+--------------+ FV Prox  Full           Yes      Yes                                 +---------+---------------+---------+-----------+----------+--------------+ FV Mid   Full           Yes      Yes                                 +---------+---------------+---------+-----------+----------+--------------+ FV DistalFull           Yes      Yes                                  +---------+---------------+---------+-----------+----------+--------------+ PFV  Full                                                        +---------+---------------+---------+-----------+----------+--------------+ POP      Full           Yes      Yes                                 +---------+---------------+---------+-----------+----------+--------------+ PTV      Full                                                        +---------+---------------+---------+-----------+----------+--------------+ PERO     None           No       No                   Acute          +---------+---------------+---------+-----------+----------+--------------+    Summary: BILATERAL: -No evidence of popliteal cyst, bilaterally. RIGHT: - There is no evidence of deep vein thrombosis in the lower extremity.  LEFT: - Findings consistent with acute deep vein thrombosis involving the left peroneal veins.   *See table(s) above for measurements and observations.    Preliminary       Elisa Sorlie T. Daronte Shostak [facility]  If 7PM-7AM, please contact night-coverage [email or web address] 09/10/2023, 12:50 PM

## 2023-09-10 NOTE — Plan of Care (Signed)

## 2023-09-10 NOTE — Plan of Care (Signed)
Monitor labs and vital signs. Continue IVF's. Monitor for signs and symptoms of shortness of breath. Monitor skin integrityl

## 2023-09-10 NOTE — Progress Notes (Signed)
Mobility Specialist - Progress Note  During mobility: 131 bpm HR, Post-mobility: 104 bpm HR, 95% SPO2   09/10/23 1407  Oxygen Therapy  O2 Device Nasal Cannula  O2 Flow Rate (L/min) 2 L/min  Mobility  Activity Ambulated independently in hallway  Level of Assistance Independent after set-up  Assistive Device None  Distance Ambulated (ft) 150 ft  Range of Motion/Exercises Active  Activity Response Tolerated fair  Mobility Referral Yes  $Mobility charge 1 Mobility  Mobility Specialist Start Time (ACUTE ONLY) 1355  Mobility Specialist Stop Time (ACUTE ONLY) 1407  Mobility Specialist Time Calculation (min) (ACUTE ONLY) 12 min   Pt was found on recliner chair and agreeable to ambulate. C/o chest pain during session HR 131 bpm which limited session. Pt returned to bed with all needs met. Call bell in reach.  Billey Chang Mobility Specialist

## 2023-09-10 NOTE — Progress Notes (Signed)
ANTICOAGULATION CONSULT NOTE  Pharmacy Consult for IV heparin  Indication: acute pulmonary embolus  Allergies  Allergen Reactions   Crestor [Rosuvastatin]     4 prior statins, crestor, lipitor, livalo, bad cramping   Other Other (See Comments)    NO Blood Transfusions, or giving blood   Sulfa Antibiotics Rash    Patient Measurements: Height: 5\' 6"  (167.6 cm) Weight: (!) 139.3 kg (307 lb) IBW/kg (Calculated) : 59.3 Heparin Dosing Weight: 94 kg  Vital Signs: Temp: 97.9 F (36.6 C) (09/23 0542) Temp Source: Oral (09/23 0542) BP: 107/74 (09/23 0542) Pulse Rate: 107 (09/23 0542)  Labs: Recent Labs    09/09/23 1103 09/09/23 1218 09/09/23 1311 09/09/23 2021 09/10/23 0414 09/10/23 0801  HGB 10.7* 11.9*  --   --  10.4*  --   HCT 34.6* 35.0*  --   --  34.8*  --   PLT 293  --   --   --  281  --   HEPARINUNFRC  --   --   --  0.20*  --  0.51  CREATININE 1.40* 1.40*  --   --  1.44*  --   TROPONINIHS 17  --  19*  --   --   --     Estimated Creatinine Clearance: 53.1 mL/min (A) (by C-G formula based on SCr of 1.44 mg/dL (H)).   Assessment: Patient is a 72 y.o F who presented to the ED on 09/09/23 with c/o CP and SOB. Chest CT + acute bilateral PE with evidence of RHS. Pharmacy has been consulted to dose heparin infusion for VTE treatment.  Today, 09/10/2023: - Heparin level = 0.51 units/mL, now therapeutic - CBC: Hgb decreased to 10.4, Plt WNL  - No bleeding or infusion issues noted per nursing  - SCr 1.44, CrCl ~ 53 ml/min  Goal of Therapy:  Heparin level 0.3-0.7 units/ml Monitor platelets by anticoagulation protocol: Yes   Plan:  - Continue heparin infusion at 1950 units/hr - Check heparin level in 6 hours to confirm remains within therapeutic range - Daily CBC, heparin level - Monitor closely for s/sx of bleeding  - F/u for transition to oral anticoagulation    Greer Pickerel, PharmD, BCPS Clinical Pharmacist 09/10/2023, 8:35 AM

## 2023-09-10 NOTE — Progress Notes (Signed)
BLE venous duplex has been completed.  Preliminary findings given to Gratz, Charity fundraiser.    Results can be found under chart review under CV PROC. 09/10/2023 12:04 PM Mileah Hemmer RVT, RDMS

## 2023-09-11 ENCOUNTER — Other Ambulatory Visit (HOSPITAL_COMMUNITY): Payer: Self-pay

## 2023-09-11 ENCOUNTER — Telehealth: Payer: Self-pay

## 2023-09-11 DIAGNOSIS — I2609 Other pulmonary embolism with acute cor pulmonale: Secondary | ICD-10-CM | POA: Diagnosis not present

## 2023-09-11 DIAGNOSIS — K219 Gastro-esophageal reflux disease without esophagitis: Secondary | ICD-10-CM | POA: Diagnosis not present

## 2023-09-11 DIAGNOSIS — E1169 Type 2 diabetes mellitus with other specified complication: Secondary | ICD-10-CM | POA: Diagnosis not present

## 2023-09-11 LAB — CBC
HCT: 35.2 % — ABNORMAL LOW (ref 36.0–46.0)
Hemoglobin: 10.9 g/dL — ABNORMAL LOW (ref 12.0–15.0)
MCH: 29.2 pg (ref 26.0–34.0)
MCHC: 31 g/dL (ref 30.0–36.0)
MCV: 94.4 fL (ref 80.0–100.0)
Platelets: 238 10*3/uL (ref 150–400)
RBC: 3.73 MIL/uL — ABNORMAL LOW (ref 3.87–5.11)
RDW: 14.7 % (ref 11.5–15.5)
WBC: 7.2 10*3/uL (ref 4.0–10.5)
nRBC: 0 % (ref 0.0–0.2)

## 2023-09-11 LAB — RENAL FUNCTION PANEL
Albumin: 3.1 g/dL — ABNORMAL LOW (ref 3.5–5.0)
Anion gap: 11 (ref 5–15)
BUN: 20 mg/dL (ref 8–23)
CO2: 24 mmol/L (ref 22–32)
Calcium: 8.9 mg/dL (ref 8.9–10.3)
Chloride: 102 mmol/L (ref 98–111)
Creatinine, Ser: 1.38 mg/dL — ABNORMAL HIGH (ref 0.44–1.00)
GFR, Estimated: 41 mL/min — ABNORMAL LOW (ref >60)
Glucose, Bld: 167 mg/dL — ABNORMAL HIGH (ref 70–99)
Phosphorus: 3.7 mg/dL (ref 2.5–4.6)
Potassium: 3.9 mmol/L (ref 3.5–5.1)
Sodium: 137 mmol/L (ref 135–145)

## 2023-09-11 LAB — HEPARIN LEVEL (UNFRACTIONATED): Heparin Unfractionated: 0.45 IU/mL (ref 0.30–0.70)

## 2023-09-11 LAB — MAGNESIUM: Magnesium: 1.6 mg/dL — ABNORMAL LOW (ref 1.7–2.4)

## 2023-09-11 LAB — GLUCOSE, CAPILLARY
Glucose-Capillary: 183 mg/dL — ABNORMAL HIGH (ref 70–99)
Glucose-Capillary: 189 mg/dL — ABNORMAL HIGH (ref 70–99)

## 2023-09-11 MED ORDER — LOSARTAN POTASSIUM-HCTZ 100-12.5 MG PO TABS: 1.0000 | ORAL_TABLET | Freq: Every day | ORAL | Status: DC

## 2023-09-11 MED ORDER — APIXABAN (ELIQUIS) VTE STARTER PACK (10MG AND 5MG)
ORAL_TABLET | ORAL | 0 refills | Status: DC
  Filled 2023-09-11: qty 74, 28d supply, fill #0

## 2023-09-11 MED ORDER — APIXABAN 5 MG PO TABS: 5.0000 mg | ORAL_TABLET | Freq: Two times a day (BID) | ORAL | 3 refills | Status: DC

## 2023-09-11 MED ORDER — MAGNESIUM SULFATE 2 GM/50ML IV SOLN
2.0000 g | Freq: Once | INTRAVENOUS | Status: AC
  Administered 2023-09-11: 2 g via INTRAVENOUS
  Filled 2023-09-11: qty 50

## 2023-09-11 MED ORDER — APIXABAN 5 MG PO TABS: 5.0000 mg | ORAL_TABLET | Freq: Two times a day (BID) | ORAL | Status: DC

## 2023-09-11 MED ORDER — APIXABAN 5 MG PO TABS
10.0000 mg | ORAL_TABLET | Freq: Two times a day (BID) | ORAL | Status: DC
  Administered 2023-09-11: 10 mg via ORAL
  Filled 2023-09-11: qty 2

## 2023-09-11 NOTE — Discharge Instructions (Addendum)
Information on my medicine - ELIQUIS (apixaban)  Why was Eliquis prescribed for you? Eliquis was prescribed to treat blood clots that may have been found in the veins of your legs (deep vein thrombosis) or in your lungs (pulmonary embolism) and to reduce the risk of them occurring again.  What do You need to know about Eliquis ? The starting dose is 10 mg (two 5 mg tablets) taken TWICE daily for the FIRST SEVEN (7) DAYS, then on 09/18/2023, the dose is reduced to ONE 5 mg tablet taken TWICE daily.  Eliquis may be taken with or without food.   Try to take the dose about the same time in the morning and in the evening. If you have difficulty swallowing the tablet whole please discuss with your pharmacist how to take the medication safely.  Take Eliquis exactly as prescribed and DO NOT stop taking Eliquis without talking to the doctor who prescribed the medication.  Stopping may increase your risk of developing a new blood clot.  Refill your prescription before you run out.  After discharge, you should have regular check-up appointments with your healthcare provider that is prescribing your Eliquis.    What do you do if you miss a dose? If a dose of ELIQUIS is not taken at the scheduled time, take it as soon as possible on the same day and twice-daily administration should be resumed. The dose should not be doubled to make up for a missed dose.  Important Safety Information A possible side effect of Eliquis is bleeding. You should call your healthcare provider right away if you experience any of the following: Bleeding from an injury or your nose that does not stop. Unusual colored urine (red or dark brown) or unusual colored stools (red or black). Unusual bruising for unknown reasons. A serious fall or if you hit your head (even if there is no bleeding).  Some medicines may interact with Eliquis and might increase your risk of bleeding or clotting while on Eliquis. To help avoid  this, consult your healthcare provider or pharmacist prior to using any new prescription or non-prescription medications, including herbals, vitamins, non-steroidal anti-inflammatory drugs (NSAIDs) and supplements.  This website has more information on Eliquis (apixaban): http://www.eliquis.com/eliquis/home

## 2023-09-11 NOTE — Progress Notes (Signed)
SATURATION QUALIFICATIONS: (This note is used to comply with regulatory documentation for home oxygen)  Patient Saturations on Room Air at Rest = 95%  Patient Saturations on Room Air while Ambulating = 85%  Patient Saturations on 1 Liters of oxygen while Ambulating = 94%  Please briefly explain why patient needs home oxygen:

## 2023-09-11 NOTE — Progress Notes (Signed)
Mobility Specialist - Progress Note Nurse requested Mobility Specialist to perform oxygen saturation test with pt which includes removing pt from oxygen both at rest and while ambulating.  Below are the results from that testing.     Patient Saturations on Room Air at Rest = spO2 95%  Patient Saturations on Room Air while Ambulating = sp02 85% .  Rested and performed pursed lip breathing for 1 minute with sp02 at 94%.  Patient Saturations on n/a Liters of oxygen while Ambulating = sp02 n/a%  At end of testing pt left in room on 2  Liters of oxygen.  Reported results to nurse.     09/11/23 0941  Mobility  Activity Ambulated independently in hallway  Level of Assistance Independent after set-up  Assistive Device None  Distance Ambulated (ft) 150 ft  Range of Motion/Exercises Active  Activity Response Tolerated well  Mobility Referral Yes  $Mobility charge 1 Mobility  Mobility Specialist Start Time (ACUTE ONLY) 0930  Mobility Specialist Stop Time (ACUTE ONLY) 0941  Mobility Specialist Time Calculation (min) (ACUTE ONLY) 11 min    Pre-mobility: 90 pm HR, 95% SpO2 During mobility: 125 bpm HR, 85-94% SpO2 Post-mobility: 106 bpm HR, 98% SPO2  Pt was found on recliner chair and agreeable to ambulate. Pt SPO2 decreased briefly to 85% and increased back >90% within 30secs. Pt returned to recliner chair at EOS. All needs met and call bell in reach. RN notified of session.  Billey Chang Mobility Specialist

## 2023-09-11 NOTE — Plan of Care (Signed)

## 2023-09-11 NOTE — Telephone Encounter (Signed)
Please see that patient was in the hospital

## 2023-09-11 NOTE — Progress Notes (Signed)
ANTICOAGULATION CONSULT NOTE  Pharmacy Consult for IV heparin > PO apixaban Indication: acute PE/DVT  Allergies  Allergen Reactions   Crestor [Rosuvastatin]     4 prior statins, crestor, lipitor, livalo, bad cramping   Other Other (See Comments)    NO Blood Transfusions, or giving blood   Sulfa Antibiotics Rash    Patient Measurements: Height: 5\' 6"  (167.6 cm) Weight: (!) 139.3 kg (307 lb) IBW/kg (Calculated) : 59.3 Heparin Dosing Weight: 94 kg  Vital Signs: Temp: 98.1 F (36.7 C) (09/24 0527) Temp Source: Oral (09/24 0527) BP: 112/69 (09/24 0527) Pulse Rate: 85 (09/24 0527)  Labs: Recent Labs    09/09/23 1103 09/09/23 1218 09/09/23 1311 09/09/23 2021 09/10/23 0414 09/10/23 0801 09/10/23 1343 09/11/23 0426  HGB 10.7* 11.9*  --   --  10.4*  --   --  10.9*  HCT 34.6* 35.0*  --   --  34.8*  --   --  35.2*  PLT 293  --   --   --  281  --   --  238  HEPARINUNFRC  --   --   --    < >  --  0.51 0.35 0.45  CREATININE 1.40* 1.40*  --   --  1.44*  --   --  1.38*  TROPONINIHS 17  --  19*  --   --   --   --   --    < > = values in this interval not displayed.    Estimated Creatinine Clearance: 55.5 mL/min (A) (by C-G formula based on SCr of 1.38 mg/dL (H)).   Assessment: Patient is a 72 y.o F who presented to the ED on 09/09/23 with c/o CP and SOB. Chest CT + acute bilateral PE with evidence of RHS. Lower extremity venous Doppler with left peroneal vein DVT. Pharmacy initially consulted to dose heparin infusion for VTE treatment, now asked to transition to Apixaban.  Today, 09/11/2023: - Heparin level = 0.45 units/mL, remains therapeutic - CBC: Hgb stable at 10.9. Plt WNL.  - No bleeding issues noted per nursing    Plan:  - Stop heparin infusion - At the same time, start Apixaban 10mg  PO BID x 7 days, then 5mg  PO BID thereafter - Monitor CBC and for s/sx of bleeding - Pharmacy to provide education prior to discharge    Greer Pickerel, PharmD, BCPS Clinical  Pharmacist 09/11/2023, 7:43 AM

## 2023-09-11 NOTE — Telephone Encounter (Signed)
Pt states she was discharged from the hospital for blood clots in her left leg and lungs

## 2023-09-11 NOTE — Plan of Care (Signed)
Monitor labs and vital signs. Monitor edema and dyspnea. Reinforce education. Pharmacy maintaining heparin drip rate. Continue current plan of care.

## 2023-09-11 NOTE — Discharge Summary (Signed)
145 mg total) by mouth daily.   losartan-hydrochlorothiazide 100-12.5 MG tablet Commonly known as: HYZAAR Take 1 tablet by mouth daily. Start taking on: September 15, 2023 What changed: These instructions start on September 15, 2023. If you are unsure what to do until then, ask your doctor or other care provider.   MAGNESIUM CITRATE PO Take 1 tablet by mouth daily as needed.   metFORMIN 1000 MG tablet Commonly known as: GLUCOPHAGE TAKE 1 TABLET BY MOUTH TWICE  DAILY WITH MEALS   Mounjaro 10 MG/0.5ML Pen Generic drug: tirzepatide Inject 10 mg into the skin once a week.   multivitamin tablet Take 1 tablet by mouth daily.   Solus V2 Blood Glucose System Devi Test 1-2 times a day Dx E11.9   VITAMIN B-12 PO Take 1 tablet by mouth daily at 2 PM.        Consultations: None  Procedures/Studies:   ECHOCARDIOGRAM COMPLETE  Result Date: 09/10/2023    ECHOCARDIOGRAM REPORT   Patient Name:   [name] Date of Exam: 09/10/2023 Medical Rec #:  [id number]       Height:       66.0 in Accession #:    [id number]     Weight:       307.0 lb Date of Birth:  1951-01-24      BSA:          2.399 m Patient Age:    71 years       BP:           113/69 mmHg Patient Gender: F              HR:           102 bpm. Exam Location:  Inpatient Procedure: 2D Echo, Cardiac Doppler and Color Doppler Indications:    Pulmonary Embolus I26.09  History:        Patient has no prior history of Echocardiogram examinations.                 Arrythmias:Tachycardia; Risk Factors:Diabetes and Hypertension.  Sonographer:    Darlys Gales Referring Phys: [id number] DAVID MANUEL ORTIZ IMPRESSIONS  1. Left ventricular ejection fraction, by estimation, is 55 to 60%. The left ventricle has normal function. The left ventricle has no regional wall motion abnormalities. Left ventricular diastolic function could not be evaluated.  2. Right ventricular systolic function was not well visualized. The right ventricular size is moderately enlarged.  3. The mitral valve was not well visualized. No evidence of mitral valve regurgitation. No evidence of mitral stenosis.  4. The aortic valve is grossly normal. Aortic valve regurgitation is not visualized. No aortic stenosis is present. Comparison(s): Prior images unable to be directly viewed, comparison made by report only. Conclusion(s)/Recommendation(s): Very technically limited study, with apical images most helpful but still limited. Unable to see RV well but appears moderately to severely enlarged compared to LV. Valves not well seen but no severe valve disease by Doppler. Findings communicated with Dr. Alanda Slim. FINDINGS  Left Ventricle: Left ventricular ejection fraction, by estimation, is 55 to 60%. The left ventricle has normal function. The left ventricle has no regional wall motion abnormalities. The left ventricular internal cavity size was normal in size. There is  no left ventricular hypertrophy. Left ventricular diastolic function could not be evaluated. Right  Ventricle: The right ventricular size is moderately enlarged. Right vetricular wall thickness was not well visualized. Right ventricular systolic function was not well visualized. Left Atrium: Left atrial size was  145 mg total) by mouth daily.   losartan-hydrochlorothiazide 100-12.5 MG tablet Commonly known as: HYZAAR Take 1 tablet by mouth daily. Start taking on: September 15, 2023 What changed: These instructions start on September 15, 2023. If you are unsure what to do until then, ask your doctor or other care provider.   MAGNESIUM CITRATE PO Take 1 tablet by mouth daily as needed.   metFORMIN 1000 MG tablet Commonly known as: GLUCOPHAGE TAKE 1 TABLET BY MOUTH TWICE  DAILY WITH MEALS   Mounjaro 10 MG/0.5ML Pen Generic drug: tirzepatide Inject 10 mg into the skin once a week.   multivitamin tablet Take 1 tablet by mouth daily.   Solus V2 Blood Glucose System Devi Test 1-2 times a day Dx E11.9   VITAMIN B-12 PO Take 1 tablet by mouth daily at 2 PM.        Consultations: None  Procedures/Studies:   ECHOCARDIOGRAM COMPLETE  Result Date: 09/10/2023    ECHOCARDIOGRAM REPORT   Patient Name:   [name] Date of Exam: 09/10/2023 Medical Rec #:  [id number]       Height:       66.0 in Accession #:    [id number]     Weight:       307.0 lb Date of Birth:  1951-01-24      BSA:          2.399 m Patient Age:    71 years       BP:           113/69 mmHg Patient Gender: F              HR:           102 bpm. Exam Location:  Inpatient Procedure: 2D Echo, Cardiac Doppler and Color Doppler Indications:    Pulmonary Embolus I26.09  History:        Patient has no prior history of Echocardiogram examinations.                 Arrythmias:Tachycardia; Risk Factors:Diabetes and Hypertension.  Sonographer:    Darlys Gales Referring Phys: [id number] DAVID MANUEL ORTIZ IMPRESSIONS  1. Left ventricular ejection fraction, by estimation, is 55 to 60%. The left ventricle has normal function. The left ventricle has no regional wall motion abnormalities. Left ventricular diastolic function could not be evaluated.  2. Right ventricular systolic function was not well visualized. The right ventricular size is moderately enlarged.  3. The mitral valve was not well visualized. No evidence of mitral valve regurgitation. No evidence of mitral stenosis.  4. The aortic valve is grossly normal. Aortic valve regurgitation is not visualized. No aortic stenosis is present. Comparison(s): Prior images unable to be directly viewed, comparison made by report only. Conclusion(s)/Recommendation(s): Very technically limited study, with apical images most helpful but still limited. Unable to see RV well but appears moderately to severely enlarged compared to LV. Valves not well seen but no severe valve disease by Doppler. Findings communicated with Dr. Alanda Slim. FINDINGS  Left Ventricle: Left ventricular ejection fraction, by estimation, is 55 to 60%. The left ventricle has normal function. The left ventricle has no regional wall motion abnormalities. The left ventricular internal cavity size was normal in size. There is  no left ventricular hypertrophy. Left ventricular diastolic function could not be evaluated. Right  Ventricle: The right ventricular size is moderately enlarged. Right vetricular wall thickness was not well visualized. Right ventricular systolic function was not well visualized. Left Atrium: Left atrial size was  Physician Discharge Summary  [name] ZOX:[id number] DOB: October 03, 1951 DOA: 09/09/2023  PCP: [name], PA-C  Admit date: 09/09/2023 Discharge date: 09/11/2023 Admitted From: Home Disposition: Home Recommendations for Outpatient Follow-up:  Follow up with PCP in 1 week Check CMP and CBC in 1 week Repeat echocardiogram in 3 to 4 months Ensure age-appropriate cancer screening Please follow up on the following pending results: None  Home Health: None Equipment/Devices: None  Discharge Condition: Stable CODE STATUS: Full code  Follow-up Information     [name], PA-C. Schedule an appointment as soon as possible for a visit in 1 week(s).   Specialty: Family Medicine Contact information: [address] [place] [address] [phone]                 Hospital course 72 year old F with PMH of DM-2, CKD-3A, HTN, morbid obesity, osteoarthritis, b/l TKA, goiter and chronic edema presenting with increased shortness of breath and pleuritic chest pain for 2 days, and found to have bilateral acute pulmonary embolism with radiologic evidence of right heart strain.  She was admitted and started on IV heparin.  No recent travel, significant immobility or surgery.  No prior history of blood clot or cancer.  Not on HRT.  Denies smoking cigarettes.  TTE and lower extremity venous Doppler ordered.   The next day, patient remained stable.  LE venous Doppler positive for left lower extremity peroneal vein DVT.  TTE raised concern for moderate to severe RV enlargement but technically very limited study per cardiology.  On the discharge, patient remained stable and felt well and ready to go home.  Oxygen saturation dropped to 85% with ambulation on room air but recovered to mid 90s without oxygen after resting and pursed lip breathing.  She is discharged on starter pack Eliquis.  Recommend repeat echocardiogram in 3 to 4 months for better assessment of possible RV  enlargement.  See individual problem list below for more.   Problems addressed during this hospitalization Principal Problem:   Acute pulmonary embolism with acute cor pulmonale (HCC) Active Problems:   OBESITY, MORBID   Hypertension associated with diabetes (HCC)   Subclinical hypothyroidism   GERD (gastroesophageal reflux disease)   Hyperlipidemia associated with type 2 diabetes mellitus (HCC)   Chronic kidney disease   Type 2 diabetes mellitus with diabetic nephropathy (HCC)   Mixed hyperlipidemia   Normocytic anemia   Bilateral pulmonary embolism with acute cor pulmonale: Seems unprovoked.  CT angio with RV/LV ratio> 0.9.  Hemodynamically stable.  No significant respiratory distress other than cough.  Lower extremity venous Doppler with left peroneal vein DVT. TTE raised concern for moderate to severe RV enlargement but technically very limited study per cardiology. -Transitioned to start the pack Eliquis -Recommend repeat echocardiogram in 3 to 4 months -Check CMP and CBC at follow-up -Age-appropriate cancer screening   NIDDM-2 with hyperglycemia, neuropathy and CKD-3A: A1c 8.6% in 07/2023. -Continue home meds   CKD-3A: Cr higher than baseline but improved.  She is on losartan/HCTZ -Advised to hold Hyzaar/HCT for 4 more days -Recheck renal function at follow-up   Hypertension associated with diabetes: Normotensive off home antihypertensive meds. -Advised to hold home Hyzaar/HCTZ for 4 days   Normocytic anemia: Likely anemia of chronic disease.  Stable. -Recheck CBC in 1 week   Osteoarthritis history of bilateral TKA: Stable   Mixed hyperlipidemia -Continue home meds   Subclinical hypothyroidism -Follow-up with PCP.   GERD -Continue home meds   Morbid obesity Body mass  145 mg total) by mouth daily.   losartan-hydrochlorothiazide 100-12.5 MG tablet Commonly known as: HYZAAR Take 1 tablet by mouth daily. Start taking on: September 15, 2023 What changed: These instructions start on September 15, 2023. If you are unsure what to do until then, ask your doctor or other care provider.   MAGNESIUM CITRATE PO Take 1 tablet by mouth daily as needed.   metFORMIN 1000 MG tablet Commonly known as: GLUCOPHAGE TAKE 1 TABLET BY MOUTH TWICE  DAILY WITH MEALS   Mounjaro 10 MG/0.5ML Pen Generic drug: tirzepatide Inject 10 mg into the skin once a week.   multivitamin tablet Take 1 tablet by mouth daily.   Solus V2 Blood Glucose System Devi Test 1-2 times a day Dx E11.9   VITAMIN B-12 PO Take 1 tablet by mouth daily at 2 PM.        Consultations: None  Procedures/Studies:   ECHOCARDIOGRAM COMPLETE  Result Date: 09/10/2023    ECHOCARDIOGRAM REPORT   Patient Name:   [name] Date of Exam: 09/10/2023 Medical Rec #:  [id number]       Height:       66.0 in Accession #:    [id number]     Weight:       307.0 lb Date of Birth:  1951-01-24      BSA:          2.399 m Patient Age:    71 years       BP:           113/69 mmHg Patient Gender: F              HR:           102 bpm. Exam Location:  Inpatient Procedure: 2D Echo, Cardiac Doppler and Color Doppler Indications:    Pulmonary Embolus I26.09  History:        Patient has no prior history of Echocardiogram examinations.                 Arrythmias:Tachycardia; Risk Factors:Diabetes and Hypertension.  Sonographer:    Darlys Gales Referring Phys: [id number] DAVID MANUEL ORTIZ IMPRESSIONS  1. Left ventricular ejection fraction, by estimation, is 55 to 60%. The left ventricle has normal function. The left ventricle has no regional wall motion abnormalities. Left ventricular diastolic function could not be evaluated.  2. Right ventricular systolic function was not well visualized. The right ventricular size is moderately enlarged.  3. The mitral valve was not well visualized. No evidence of mitral valve regurgitation. No evidence of mitral stenosis.  4. The aortic valve is grossly normal. Aortic valve regurgitation is not visualized. No aortic stenosis is present. Comparison(s): Prior images unable to be directly viewed, comparison made by report only. Conclusion(s)/Recommendation(s): Very technically limited study, with apical images most helpful but still limited. Unable to see RV well but appears moderately to severely enlarged compared to LV. Valves not well seen but no severe valve disease by Doppler. Findings communicated with Dr. Alanda Slim. FINDINGS  Left Ventricle: Left ventricular ejection fraction, by estimation, is 55 to 60%. The left ventricle has normal function. The left ventricle has no regional wall motion abnormalities. The left ventricular internal cavity size was normal in size. There is  no left ventricular hypertrophy. Left ventricular diastolic function could not be evaluated. Right  Ventricle: The right ventricular size is moderately enlarged. Right vetricular wall thickness was not well visualized. Right ventricular systolic function was not well visualized. Left Atrium: Left atrial size was  145 mg total) by mouth daily.   losartan-hydrochlorothiazide 100-12.5 MG tablet Commonly known as: HYZAAR Take 1 tablet by mouth daily. Start taking on: September 15, 2023 What changed: These instructions start on September 15, 2023. If you are unsure what to do until then, ask your doctor or other care provider.   MAGNESIUM CITRATE PO Take 1 tablet by mouth daily as needed.   metFORMIN 1000 MG tablet Commonly known as: GLUCOPHAGE TAKE 1 TABLET BY MOUTH TWICE  DAILY WITH MEALS   Mounjaro 10 MG/0.5ML Pen Generic drug: tirzepatide Inject 10 mg into the skin once a week.   multivitamin tablet Take 1 tablet by mouth daily.   Solus V2 Blood Glucose System Devi Test 1-2 times a day Dx E11.9   VITAMIN B-12 PO Take 1 tablet by mouth daily at 2 PM.        Consultations: None  Procedures/Studies:   ECHOCARDIOGRAM COMPLETE  Result Date: 09/10/2023    ECHOCARDIOGRAM REPORT   Patient Name:   [name] Date of Exam: 09/10/2023 Medical Rec #:  [id number]       Height:       66.0 in Accession #:    [id number]     Weight:       307.0 lb Date of Birth:  1951-01-24      BSA:          2.399 m Patient Age:    71 years       BP:           113/69 mmHg Patient Gender: F              HR:           102 bpm. Exam Location:  Inpatient Procedure: 2D Echo, Cardiac Doppler and Color Doppler Indications:    Pulmonary Embolus I26.09  History:        Patient has no prior history of Echocardiogram examinations.                 Arrythmias:Tachycardia; Risk Factors:Diabetes and Hypertension.  Sonographer:    Darlys Gales Referring Phys: [id number] DAVID MANUEL ORTIZ IMPRESSIONS  1. Left ventricular ejection fraction, by estimation, is 55 to 60%. The left ventricle has normal function. The left ventricle has no regional wall motion abnormalities. Left ventricular diastolic function could not be evaluated.  2. Right ventricular systolic function was not well visualized. The right ventricular size is moderately enlarged.  3. The mitral valve was not well visualized. No evidence of mitral valve regurgitation. No evidence of mitral stenosis.  4. The aortic valve is grossly normal. Aortic valve regurgitation is not visualized. No aortic stenosis is present. Comparison(s): Prior images unable to be directly viewed, comparison made by report only. Conclusion(s)/Recommendation(s): Very technically limited study, with apical images most helpful but still limited. Unable to see RV well but appears moderately to severely enlarged compared to LV. Valves not well seen but no severe valve disease by Doppler. Findings communicated with Dr. Alanda Slim. FINDINGS  Left Ventricle: Left ventricular ejection fraction, by estimation, is 55 to 60%. The left ventricle has normal function. The left ventricle has no regional wall motion abnormalities. The left ventricular internal cavity size was normal in size. There is  no left ventricular hypertrophy. Left ventricular diastolic function could not be evaluated. Right  Ventricle: The right ventricular size is moderately enlarged. Right vetricular wall thickness was not well visualized. Right ventricular systolic function was not well visualized. Left Atrium: Left atrial size was  Physician Discharge Summary  [name] ZOX:[id number] DOB: October 03, 1951 DOA: 09/09/2023  PCP: [name], PA-C  Admit date: 09/09/2023 Discharge date: 09/11/2023 Admitted From: Home Disposition: Home Recommendations for Outpatient Follow-up:  Follow up with PCP in 1 week Check CMP and CBC in 1 week Repeat echocardiogram in 3 to 4 months Ensure age-appropriate cancer screening Please follow up on the following pending results: None  Home Health: None Equipment/Devices: None  Discharge Condition: Stable CODE STATUS: Full code  Follow-up Information     [name], PA-C. Schedule an appointment as soon as possible for a visit in 1 week(s).   Specialty: Family Medicine Contact information: [address] [place] [address] [phone]                 Hospital course 72 year old F with PMH of DM-2, CKD-3A, HTN, morbid obesity, osteoarthritis, b/l TKA, goiter and chronic edema presenting with increased shortness of breath and pleuritic chest pain for 2 days, and found to have bilateral acute pulmonary embolism with radiologic evidence of right heart strain.  She was admitted and started on IV heparin.  No recent travel, significant immobility or surgery.  No prior history of blood clot or cancer.  Not on HRT.  Denies smoking cigarettes.  TTE and lower extremity venous Doppler ordered.   The next day, patient remained stable.  LE venous Doppler positive for left lower extremity peroneal vein DVT.  TTE raised concern for moderate to severe RV enlargement but technically very limited study per cardiology.  On the discharge, patient remained stable and felt well and ready to go home.  Oxygen saturation dropped to 85% with ambulation on room air but recovered to mid 90s without oxygen after resting and pursed lip breathing.  She is discharged on starter pack Eliquis.  Recommend repeat echocardiogram in 3 to 4 months for better assessment of possible RV  enlargement.  See individual problem list below for more.   Problems addressed during this hospitalization Principal Problem:   Acute pulmonary embolism with acute cor pulmonale (HCC) Active Problems:   OBESITY, MORBID   Hypertension associated with diabetes (HCC)   Subclinical hypothyroidism   GERD (gastroesophageal reflux disease)   Hyperlipidemia associated with type 2 diabetes mellitus (HCC)   Chronic kidney disease   Type 2 diabetes mellitus with diabetic nephropathy (HCC)   Mixed hyperlipidemia   Normocytic anemia   Bilateral pulmonary embolism with acute cor pulmonale: Seems unprovoked.  CT angio with RV/LV ratio> 0.9.  Hemodynamically stable.  No significant respiratory distress other than cough.  Lower extremity venous Doppler with left peroneal vein DVT. TTE raised concern for moderate to severe RV enlargement but technically very limited study per cardiology. -Transitioned to start the pack Eliquis -Recommend repeat echocardiogram in 3 to 4 months -Check CMP and CBC at follow-up -Age-appropriate cancer screening   NIDDM-2 with hyperglycemia, neuropathy and CKD-3A: A1c 8.6% in 07/2023. -Continue home meds   CKD-3A: Cr higher than baseline but improved.  She is on losartan/HCTZ -Advised to hold Hyzaar/HCT for 4 more days -Recheck renal function at follow-up   Hypertension associated with diabetes: Normotensive off home antihypertensive meds. -Advised to hold home Hyzaar/HCTZ for 4 days   Normocytic anemia: Likely anemia of chronic disease.  Stable. -Recheck CBC in 1 week   Osteoarthritis history of bilateral TKA: Stable   Mixed hyperlipidemia -Continue home meds   Subclinical hypothyroidism -Follow-up with PCP.   GERD -Continue home meds   Morbid obesity Body mass  Physician Discharge Summary  [name] ZOX:[id number] DOB: October 03, 1951 DOA: 09/09/2023  PCP: [name], PA-C  Admit date: 09/09/2023 Discharge date: 09/11/2023 Admitted From: Home Disposition: Home Recommendations for Outpatient Follow-up:  Follow up with PCP in 1 week Check CMP and CBC in 1 week Repeat echocardiogram in 3 to 4 months Ensure age-appropriate cancer screening Please follow up on the following pending results: None  Home Health: None Equipment/Devices: None  Discharge Condition: Stable CODE STATUS: Full code  Follow-up Information     [name], PA-C. Schedule an appointment as soon as possible for a visit in 1 week(s).   Specialty: Family Medicine Contact information: [address] [place] [address] [phone]                 Hospital course 72 year old F with PMH of DM-2, CKD-3A, HTN, morbid obesity, osteoarthritis, b/l TKA, goiter and chronic edema presenting with increased shortness of breath and pleuritic chest pain for 2 days, and found to have bilateral acute pulmonary embolism with radiologic evidence of right heart strain.  She was admitted and started on IV heparin.  No recent travel, significant immobility or surgery.  No prior history of blood clot or cancer.  Not on HRT.  Denies smoking cigarettes.  TTE and lower extremity venous Doppler ordered.   The next day, patient remained stable.  LE venous Doppler positive for left lower extremity peroneal vein DVT.  TTE raised concern for moderate to severe RV enlargement but technically very limited study per cardiology.  On the discharge, patient remained stable and felt well and ready to go home.  Oxygen saturation dropped to 85% with ambulation on room air but recovered to mid 90s without oxygen after resting and pursed lip breathing.  She is discharged on starter pack Eliquis.  Recommend repeat echocardiogram in 3 to 4 months for better assessment of possible RV  enlargement.  See individual problem list below for more.   Problems addressed during this hospitalization Principal Problem:   Acute pulmonary embolism with acute cor pulmonale (HCC) Active Problems:   OBESITY, MORBID   Hypertension associated with diabetes (HCC)   Subclinical hypothyroidism   GERD (gastroesophageal reflux disease)   Hyperlipidemia associated with type 2 diabetes mellitus (HCC)   Chronic kidney disease   Type 2 diabetes mellitus with diabetic nephropathy (HCC)   Mixed hyperlipidemia   Normocytic anemia   Bilateral pulmonary embolism with acute cor pulmonale: Seems unprovoked.  CT angio with RV/LV ratio> 0.9.  Hemodynamically stable.  No significant respiratory distress other than cough.  Lower extremity venous Doppler with left peroneal vein DVT. TTE raised concern for moderate to severe RV enlargement but technically very limited study per cardiology. -Transitioned to start the pack Eliquis -Recommend repeat echocardiogram in 3 to 4 months -Check CMP and CBC at follow-up -Age-appropriate cancer screening   NIDDM-2 with hyperglycemia, neuropathy and CKD-3A: A1c 8.6% in 07/2023. -Continue home meds   CKD-3A: Cr higher than baseline but improved.  She is on losartan/HCTZ -Advised to hold Hyzaar/HCT for 4 more days -Recheck renal function at follow-up   Hypertension associated with diabetes: Normotensive off home antihypertensive meds. -Advised to hold home Hyzaar/HCTZ for 4 days   Normocytic anemia: Likely anemia of chronic disease.  Stable. -Recheck CBC in 1 week   Osteoarthritis history of bilateral TKA: Stable   Mixed hyperlipidemia -Continue home meds   Subclinical hypothyroidism -Follow-up with PCP.   GERD -Continue home meds   Morbid obesity Body mass  145 mg total) by mouth daily.   losartan-hydrochlorothiazide 100-12.5 MG tablet Commonly known as: HYZAAR Take 1 tablet by mouth daily. Start taking on: September 15, 2023 What changed: These instructions start on September 15, 2023. If you are unsure what to do until then, ask your doctor or other care provider.   MAGNESIUM CITRATE PO Take 1 tablet by mouth daily as needed.   metFORMIN 1000 MG tablet Commonly known as: GLUCOPHAGE TAKE 1 TABLET BY MOUTH TWICE  DAILY WITH MEALS   Mounjaro 10 MG/0.5ML Pen Generic drug: tirzepatide Inject 10 mg into the skin once a week.   multivitamin tablet Take 1 tablet by mouth daily.   Solus V2 Blood Glucose System Devi Test 1-2 times a day Dx E11.9   VITAMIN B-12 PO Take 1 tablet by mouth daily at 2 PM.        Consultations: None  Procedures/Studies:   ECHOCARDIOGRAM COMPLETE  Result Date: 09/10/2023    ECHOCARDIOGRAM REPORT   Patient Name:   [name] Date of Exam: 09/10/2023 Medical Rec #:  [id number]       Height:       66.0 in Accession #:    [id number]     Weight:       307.0 lb Date of Birth:  1951-01-24      BSA:          2.399 m Patient Age:    71 years       BP:           113/69 mmHg Patient Gender: F              HR:           102 bpm. Exam Location:  Inpatient Procedure: 2D Echo, Cardiac Doppler and Color Doppler Indications:    Pulmonary Embolus I26.09  History:        Patient has no prior history of Echocardiogram examinations.                 Arrythmias:Tachycardia; Risk Factors:Diabetes and Hypertension.  Sonographer:    Darlys Gales Referring Phys: [id number] DAVID MANUEL ORTIZ IMPRESSIONS  1. Left ventricular ejection fraction, by estimation, is 55 to 60%. The left ventricle has normal function. The left ventricle has no regional wall motion abnormalities. Left ventricular diastolic function could not be evaluated.  2. Right ventricular systolic function was not well visualized. The right ventricular size is moderately enlarged.  3. The mitral valve was not well visualized. No evidence of mitral valve regurgitation. No evidence of mitral stenosis.  4. The aortic valve is grossly normal. Aortic valve regurgitation is not visualized. No aortic stenosis is present. Comparison(s): Prior images unable to be directly viewed, comparison made by report only. Conclusion(s)/Recommendation(s): Very technically limited study, with apical images most helpful but still limited. Unable to see RV well but appears moderately to severely enlarged compared to LV. Valves not well seen but no severe valve disease by Doppler. Findings communicated with Dr. Alanda Slim. FINDINGS  Left Ventricle: Left ventricular ejection fraction, by estimation, is 55 to 60%. The left ventricle has normal function. The left ventricle has no regional wall motion abnormalities. The left ventricular internal cavity size was normal in size. There is  no left ventricular hypertrophy. Left ventricular diastolic function could not be evaluated. Right  Ventricle: The right ventricular size is moderately enlarged. Right vetricular wall thickness was not well visualized. Right ventricular systolic function was not well visualized. Left Atrium: Left atrial size was

## 2023-09-11 NOTE — Progress Notes (Signed)
Discharge instructions reviewed w/ pt and dtr. Both verbalize understanding and all questions answered. Apixaban starter pack delivered to pt from outpt pharmacy. Dtr has gone home to get pt's clothes and will return shortly to take pt home.

## 2023-09-11 NOTE — TOC Benefit Eligibility Note (Addendum)
Patient Product/process development scientist completed.    The patient is insured through [facility]. Patient has Medicare and is not eligible for a copay card, but may be able to apply for patient assistance, if available.    Ran test claim for Eliquis 5 mg and the current 30 day co-pay is $0.00.   This test claim was processed through [facility]- copay amounts may vary at other pharmacies due to pharmacy/plan contracts, or as the patient moves through the different stages of their insurance plan.     Roland Earl, CPHT Pharmacy Technician III Certified Patient Advocate [facility] Pharmacy Patient Advocate Team Direct Number: [phone]  Fax: [phone]

## 2023-09-12 ENCOUNTER — Other Ambulatory Visit: Payer: Self-pay | Admitting: Medical

## 2023-09-12 NOTE — Telephone Encounter (Signed)
Pt scheduled for Monday 09/17/23

## 2023-09-13 ENCOUNTER — Other Ambulatory Visit: Payer: Self-pay | Admitting: Medical

## 2023-09-13 DIAGNOSIS — Z780 Asymptomatic menopausal state: Secondary | ICD-10-CM

## 2023-09-13 DIAGNOSIS — Z1231 Encounter for screening mammogram for malignant neoplasm of breast: Secondary | ICD-10-CM

## 2023-09-13 DIAGNOSIS — N189 Chronic kidney disease, unspecified: Secondary | ICD-10-CM

## 2023-09-13 DIAGNOSIS — E790 Hyperuricemia without signs of inflammatory arthritis and tophaceous disease: Secondary | ICD-10-CM

## 2023-09-17 ENCOUNTER — Ambulatory Visit (INDEPENDENT_AMBULATORY_CARE_PROVIDER_SITE_OTHER): Payer: 59 | Admitting: Medical

## 2023-09-17 VITALS — BP 120/72 | HR 74 | Temp 97.9°F | Wt 307.0 lb

## 2023-09-17 DIAGNOSIS — H6592 Unspecified nonsuppurative otitis media, left ear: Secondary | ICD-10-CM | POA: Diagnosis not present

## 2023-09-17 DIAGNOSIS — I10 Essential (primary) hypertension: Secondary | ICD-10-CM | POA: Diagnosis not present

## 2023-09-17 DIAGNOSIS — I152 Hypertension secondary to endocrine disorders: Secondary | ICD-10-CM | POA: Diagnosis not present

## 2023-09-17 DIAGNOSIS — I2609 Other pulmonary embolism with acute cor pulmonale: Secondary | ICD-10-CM

## 2023-09-17 DIAGNOSIS — E1121 Type 2 diabetes mellitus with diabetic nephropathy: Secondary | ICD-10-CM | POA: Diagnosis not present

## 2023-09-17 DIAGNOSIS — E1159 Type 2 diabetes mellitus with other circulatory complications: Secondary | ICD-10-CM

## 2023-09-17 MED ORDER — FLUTICASONE PROPIONATE 50 MCG/ACT NA SUSP: 2.0000 | Freq: Every day | NASAL | 2 refills | Status: DC

## 2023-09-17 MED ORDER — POLYETHYLENE GLYCOL 3350 17 GM/SCOOP PO POWD: 17.0000 g | Freq: Every day | ORAL | 1 refills | Status: DC

## 2023-09-17 MED ORDER — METFORMIN HCL 1000 MG PO TABS: 1000.0000 mg | ORAL_TABLET | Freq: Two times a day (BID) | ORAL | 2 refills | Status: DC

## 2023-09-17 MED ORDER — TIRZEPATIDE 7.5 MG/0.5ML ~~LOC~~ SOAJ: 7.5000 mg | SUBCUTANEOUS | 2 refills | Status: DC

## 2023-09-17 NOTE — Progress Notes (Signed)
Subjective:  [name] is a 72 y.o. female who presents for Chief Complaint  Patient presents with   Hospitalization Follow-up    Hospital follow-up from blood clots     Here for hospital follow-up. Was hospitalized 09/09/2023 through 09/11/2023.  She went in for difficulty breathing in chest discomfort and was found to have DVT in the left leg and pulmonary embolism.  Discharged on Eliquis.  Per Hyzaar HCT was held for 4 days as her creatinine bumped up a little bit.  She feels fine today.  No chest pain or shortness of breath no edema.  She is tolerating the medication fine.  She has some ongoing ear pressure and popping in the left ear.  She tried Claritin and that did not work so well.  She does have a little bit of a headache over the left eye.  She is having trouble with the [facility] at the current dose is causing too much constipation.  No recent injury, long travel, surgery, hormone therapy or other risk.  She denies prior pulmonary embolism or DVT.  Compliant with other medicines as usual  No other aggravating or relieving factors.    No other c/o.  Past Medical History:  Diagnosis Date   Abdominal hernia    Acute blood loss as cause of postoperative anemia 02/16/2017   Arthritis    knees, back, R ankle    Bilateral carpal tunnel syndrome 04/29/2012   Diabetes mellitus    Diabetes mellitus type 2 in obese 12/31/2015   Edema    legs   GERD (gastroesophageal reflux disease)    HOH (hard of hearing)    Hypertension    Hypertriglyceridemia 08/30/2015   Started on medication 08/30/15   Lipoma 09/30/2012   Obesity    morbid obesity   OBESITY, MORBID 10/16/2007   Qualifier: Diagnosis of  By: Abby Potash     Osteoarthrosis involving lower leg 10/16/2007   Qualifier: Diagnosis of  By: Abby Potash     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   Status post total right knee replacement 08/21/2017   Subclinical hypothyroidism 10/11/2016   Tachycardia  08/22/2011   Vertigo    Vitamin D deficiency 02/16/2017   Current Outpatient Medications on File Prior to Visit  Medication Sig Dispense Refill   Accu-Chek Softclix Lancets lancets TEST 1 TO 2 TIMES DAILY 100 each 1   albuterol (VENTOLIN HFA) 108 (90 Base) MCG/ACT inhaler INHALE 2 PUFFS INTO THE LUNGS EVERY 6 HOURS AS NEEDED FOR WHEEZING OR SHORTNESS OF BREATH 8.5 g 0   allopurinol (ZYLOPRIM) 100 MG tablet TAKE 1 TABLET BY MOUTH DAILY 90 tablet 3   [START ON 10/11/2023] apixaban (ELIQUIS) 5 MG TABS tablet Take 1 tablet (5 mg total) by mouth 2 (two) times daily. Start after you finish your starter pack Eliquis 60 tablet 3   APIXABAN (ELIQUIS) VTE STARTER PACK (10MG  AND 5MG ) Take as directed on package: start with two-5mg  tablets twice daily for 7 days. On day 8, switch to one-5mg  tablet twice daily. 74 each 0   Blood Glucose Monitoring Suppl (SOLUS V2 BLOOD GLUCOSE SYSTEM) DEVI Test 1-2 times a day Dx E11.9 1 each 0   Cyanocobalamin (VITAMIN B-12 PO) Take 1 tablet by mouth daily at 2 PM.     fenofibrate (TRICOR) 145 MG tablet TAKE 1 TABLET(145 MG) BY MOUTH DAILY 90 tablet 0   losartan-hydrochlorothiazide (HYZAAR) 100-12.5 MG tablet Take 1 tablet by mouth daily.     MAGNESIUM CITRATE  PO Take 1 tablet by mouth daily as needed.     Multiple Vitamin (MULTIVITAMIN) tablet Take 1 tablet by mouth daily.     glucose blood (ACCU-CHEK GUIDE) test strip TEST 1 TO 2 TIMES DAILY 200 strip 1   No current facility-administered medications on file prior to visit.    Past Surgical History:  Procedure Laterality Date   ANKLE FRACTURE SURGERY  1998?   right   CARPAL TUNNEL RELEASE Left 07/10/2014   Procedure: LEFT ENDOSCOPIC CARPAL TUNNEL RELEASE ;  Surgeon: Jodi Marble, MD;  Location: [facility];  Service: Orthopedics;  Laterality: Left;   CARPAL TUNNEL RELEASE Right    COLONOSCOPY     HERNIA REPAIR  07/03/2011   abd hernia   NOSE SURGERY  2008   sinus   TOTAL KNEE ARTHROPLASTY Left  02/06/2017   Procedure: LEFT TOTAL KNEE ARTHROPLASTY;  Surgeon: Kathryne Hitch, MD;  Location: [facility] OR;  Service: Orthopedics;  Laterality: Left;   TOTAL KNEE ARTHROPLASTY Right 08/21/2017   TOTAL KNEE ARTHROPLASTY Right 08/21/2017   Procedure: RIGHT TOTAL KNEE ARTHROPLASTY;  Surgeon: Kathryne Hitch, MD;  Location: [facility] OR;  Service: Orthopedics;  Laterality: Right;   TUBAL LIGATION      The following portions of the patient's history were reviewed and updated as appropriate: allergies, current medications, past family history, past medical history, past social history, past surgical history and problem list.  ROS Otherwise as in subjective above    Objective: BP 120/72   Pulse 74   Temp 97.9 F (36.6 C)   Wt (!) 307 lb (139.3 kg)   LMP  (LMP Unknown)   SpO2 95%   BMI 49.55 kg/m   Wt Readings from Last 3 Encounters:  09/17/23 (!) 307 lb (139.3 kg)  09/09/23 (!) 307 lb (139.3 kg)  08/14/23 (!) 307 lb 9.6 oz (139.5 kg)   General appearance: alert, no distress, well developed, well nourished HEENT: normocephalic, sclerae anicteric, conjunctiva pink and moist, left Tm with air fluid levels, right TM normal, otherwise  nares patent, no discharge or erythema, pharynx normal Oral cavity: MMM, no lesions Neck: supple, no lymphadenopathy, no thyromegaly, no masses Heart: RRR, normal S1, S2, no murmurs Lungs: CTA bilaterally, no wheezes, rhonchi, or rales Left lower leg without deformity, nontender, no palpable cord, no discoloration Pulses: 2+ radial pulses, 2+ pedal pulses, normal cap refill Ext: no edema   Assessment: Encounter Diagnoses  Name Primary?   Other acute pulmonary embolism with acute cor pulmonale (HCC) Yes   Hypertension associated with diabetes (HCC)    Type 2 diabetes mellitus with diabetic nephropathy, without long-term current use of insulin (HCC)    Primary hypertension    OBESITY, MORBID    Left serous otitis media, unspecified chronicity       Plan: Pulmonary embolism, DVT left leg-I reviewed her recent emergency department notes, discharge summary, medications reviewed.  Medications reconciled. Recently started on Eliquis.  Doing fine on this so far.  Continue Eliquis 5 mg twice daily after completing starter pack. Recommendations from hospitalization was echocardiogram repeat in 3 to 4 months given the strain on the heart from the blood clot If any worse chest pain shortness the next month or so then recheck right away The plan will be Eliquis for 4-6 months. After that period of time, we may need to check some additional labs I also recommend updated cancer screens Get your mammogram in December as planned I recommend updated colonoscopy in spring  2025 I recommend updated pap smear with gynecology in the next few months to help screen for and rule out other cancers.  Hypertension-continue current medications, get back on the Hyzaar HCT that was held briefly on discharge Restart losartan HCT also known as Hyzaar 100/12.5 mg daily  Diabetes-decrease Mounjaro back down to 7.5 mg weekly given the constipation issues.  Continue metformin 1000 mg twice daily  Constipation-you can use MiraLAX 1 capful daily as needed   [name] was seen today for hospitalization follow-up.  Diagnoses and all orders for this visit:  Other acute pulmonary embolism with acute cor pulmonale (HCC) -     Comprehensive metabolic panel -     CBC  Hypertension associated with diabetes (HCC) -     Comprehensive metabolic panel -     CBC  Type 2 diabetes mellitus with diabetic nephropathy, without long-term current use of insulin (HCC)  Primary hypertension  OBESITY, MORBID  Left serous otitis media, unspecified chronicity  Other orders -     tirzepatide (MOUNJARO) 7.5 MG/0.5ML Pen; Inject 7.5 mg into the skin once a week. -     metFORMIN (GLUCOPHAGE) 1000 MG tablet; Take 1 tablet (1,000 mg total) by mouth 2 (two) times daily with a  meal. -     fluticasone (FLONASE) 50 MCG/ACT nasal spray; Place 2 sprays into both nostrils daily. -     polyethylene glycol powder (GLYCOLAX/MIRALAX) 17 GM/SCOOP powder; Take 17 g by mouth daily.    Follow up: pending labs

## 2023-09-17 NOTE — Patient Instructions (Signed)
Pulmonary embolism, DVT left leg-I reviewed her recent emergency department notes, discharge summary, medications reviewed.  Medications reconciled. Recently started on Eliquis.  Doing fine on this so far.  Continue Eliquis 5 mg twice daily after completing starter pack. Recommendations from hospitalization was echocardiogram repeat in 3 to 4 months given the strain on the heart from the blood clot If any worse chest pain shortness the next month or so then recheck right away The plan will be Eliquis for 4-6 months. After that period of time, we may need to check some additional labs I also recommend updated cancer screens Get your mammogram in December as planned I recommend updated colonoscopy in spring 2025 I recommend updated pap smear with gynecology in the next few months to help screen for and rule out other cancers.  Hypertension-continue current medications, get back on the Hyzaar HCT that was held briefly on discharge Restart losartan HCT also known as Hyzaar 100/12.5 mg daily  Diabetes-decrease Mounjaro back down to 7.5 mg weekly given the constipation issues.  Continue metformin 1000 mg twice daily  Constipation-you can use MiraLAX 1 capful daily as needed

## 2023-09-18 ENCOUNTER — Telehealth: Payer: Self-pay | Admitting: Medical

## 2023-09-18 ENCOUNTER — Other Ambulatory Visit: Payer: Self-pay | Admitting: Medical

## 2023-09-18 DIAGNOSIS — R7989 Other specified abnormal findings of blood chemistry: Secondary | ICD-10-CM

## 2023-09-18 LAB — COMPREHENSIVE METABOLIC PANEL WITH GFR
ALT: 13 [IU]/L (ref 0–32)
AST: 14 [IU]/L (ref 0–40)
Albumin: 4 g/dL (ref 3.9–4.9)
Alkaline Phosphatase: 66 [IU]/L (ref 44–121)
BUN/Creatinine Ratio: 13 (ref 12–28)
BUN: 22 mg/dL (ref 8–27)
Bilirubin Total: 0.2 mg/dL (ref 0.0–1.2)
CO2: 23 mmol/L (ref 20–29)
Calcium: 9.6 mg/dL (ref 8.7–10.3)
Chloride: 105 mmol/L (ref 96–106)
Creatinine, Ser: 1.65 mg/dL — ABNORMAL HIGH (ref 0.57–1.00)
Globulin, Total: 3 g/dL (ref 1.5–4.5)
Glucose: 144 mg/dL — ABNORMAL HIGH (ref 70–99)
Potassium: 5.1 mmol/L (ref 3.5–5.2)
Sodium: 142 mmol/L (ref 134–144)
Total Protein: 7 g/dL (ref 6.0–8.5)
eGFR: 33 mL/min/{1.73_m2} — ABNORMAL LOW

## 2023-09-18 LAB — CBC
Hematocrit: 36.6 % (ref 34.0–46.6)
Hemoglobin: 11.3 g/dL (ref 11.1–15.9)
MCH: 29.4 pg (ref 26.6–33.0)
MCHC: 30.9 g/dL — ABNORMAL LOW (ref 31.5–35.7)
MCV: 95 fL (ref 79–97)
Platelets: 370 10*3/uL (ref 150–450)
RBC: 3.85 x10E6/uL (ref 3.77–5.28)
RDW: 13.5 % (ref 11.7–15.4)
WBC: 7.6 10*3/uL (ref 3.4–10.8)

## 2023-09-18 NOTE — Progress Notes (Signed)
Blood counts are normal, electrolytes and liver are normal.  Kidney marker has not bounced back quite yet so hold off on taking your losartan HCT 2 more days.  Make sure you are drinking least to 100 ounces of water daily to the point your urine is light yellow or clear.  After 3 days then do 4 days of 1/2 tablet daily of losartan HCT  So in 1 week you can go back to your normal dose of losartan HCT  You may want to pop back in for a recheck on your kidney function in 1 week just to make sure  This can be a nurse visit

## 2023-09-18 NOTE — Telephone Encounter (Signed)
P.A. Eather Colas not covered, printed Good Rx card, called pharmacy went thru for $19 for 3 months.  Called pt & informed & she says she's not going to need the Miralax if goes back to 7.5 mg on the [facility].  Appt made for next week

## 2023-09-25 ENCOUNTER — Ambulatory Visit (INDEPENDENT_AMBULATORY_CARE_PROVIDER_SITE_OTHER): Payer: 59 | Admitting: Medical

## 2023-09-25 ENCOUNTER — Other Ambulatory Visit: Payer: 59

## 2023-09-25 VITALS — BP 110/64 | HR 83 | Temp 97.3°F | Wt 307.6 lb

## 2023-09-25 DIAGNOSIS — M545 Low back pain, unspecified: Secondary | ICD-10-CM | POA: Diagnosis not present

## 2023-09-25 DIAGNOSIS — G8929 Other chronic pain: Secondary | ICD-10-CM

## 2023-09-25 DIAGNOSIS — Z789 Other specified health status: Secondary | ICD-10-CM | POA: Diagnosis not present

## 2023-09-25 DIAGNOSIS — N1831 Chronic kidney disease, stage 3a: Secondary | ICD-10-CM

## 2023-09-25 DIAGNOSIS — R7989 Other specified abnormal findings of blood chemistry: Secondary | ICD-10-CM

## 2023-09-25 DIAGNOSIS — K5909 Other constipation: Secondary | ICD-10-CM | POA: Diagnosis not present

## 2023-09-25 DIAGNOSIS — Z79899 Other long term (current) drug therapy: Secondary | ICD-10-CM

## 2023-09-25 DIAGNOSIS — M51369 Other intervertebral disc degeneration, lumbar region without mention of lumbar back pain or lower extremity pain: Secondary | ICD-10-CM

## 2023-09-25 DIAGNOSIS — Z86711 Personal history of pulmonary embolism: Secondary | ICD-10-CM

## 2023-09-25 MED ORDER — METHOCARBAMOL 500 MG PO TABS: 500.0000 mg | ORAL_TABLET | Freq: Three times a day (TID) | ORAL | 0 refills | Status: DC | PRN

## 2023-09-25 NOTE — Progress Notes (Addendum)
Subjective:  [name] is a 71 y.o. female who presents for Chief Complaint  Patient presents with   Back Pain    Back pain, and recheck labs. Still having issues with bowel movements- the laxative isn't working     Here for several concerns.  Patient has history of back pain.  Lately has been flared up a little bit.  No recent fall injury or trauma.  But the pain is in her low back and preventing her from walking for exercise like she likes to do.  She does not want to do water aerobics as she is scared of the water.  She uses some Biofreeze topically.  She would like to have a reinforcement brace to help.  She denies any leg pain, numbness or tingling in the legs, no saddle anesthesia, no fever, no incontinence.  She is still having some chronic constipation.  Things are better since coming down from 10 mg Mounjaro to the 7.5 mg.  She is using MiraLAX but is afraid to use it every day as she does not want to have an accident  She had updated her DNR status a few months ago but would like to change it back to full code.  After talking with family she would rather not be DNR at this time.  Here to recheck kidney marker.  At her recent hospital visit her creatinine was elevated.  At her last visit here asked her to hold her losartan HCT for a few days then resume which she did.  She is back to taking the regular dose daily and drinking plenty of fluids  Status post recent pulmonary embolism.  She is compliant with Eliquis  No other aggravating or relieving factors.    No other c/o.  Past Medical History:  Diagnosis Date   Abdominal hernia    Acute blood loss as cause of postoperative anemia 02/16/2017   Arthritis    knees, back, R ankle    Bilateral carpal tunnel syndrome 04/29/2012   Diabetes mellitus    Diabetes mellitus type 2 in obese 12/31/2015   Edema    legs   GERD (gastroesophageal reflux disease)    HOH (hard of hearing)    Hypertension    Hypertriglyceridemia  08/30/2015   Started on medication 08/30/15   Lipoma 09/30/2012   Obesity    morbid obesity   OBESITY, MORBID 10/16/2007   Qualifier: Diagnosis of  By: Abby Potash     Osteoarthrosis involving lower leg 10/16/2007   Qualifier: Diagnosis of  By: Abby Potash     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   Status post total right knee replacement 08/21/2017   Subclinical hypothyroidism 10/11/2016   Tachycardia 08/22/2011   Vertigo    Vitamin D deficiency 02/16/2017    Current Outpatient Medications on File Prior to Visit  Medication Sig Dispense Refill   Accu-Chek Softclix Lancets lancets TEST 1 TO 2 TIMES DAILY 100 each 1   albuterol (VENTOLIN HFA) 108 (90 Base) MCG/ACT inhaler INHALE 2 PUFFS INTO THE LUNGS EVERY 6 HOURS AS NEEDED FOR WHEEZING OR SHORTNESS OF BREATH 8.5 g 0   allopurinol (ZYLOPRIM) 100 MG tablet TAKE 1 TABLET BY MOUTH DAILY 90 tablet 3   [START ON 10/11/2023] apixaban (ELIQUIS) 5 MG TABS tablet Take 1 tablet (5 mg total) by mouth 2 (two) times daily. Start after you finish your starter pack Eliquis 60 tablet 3   fenofibrate (TRICOR) 145 MG tablet TAKE 1 TABLET(145 MG) BY  MOUTH DAILY 90 tablet 0   losartan-hydrochlorothiazide (HYZAAR) 100-12.5 MG tablet Take 1 tablet by mouth daily.     MAGNESIUM CITRATE PO Take 1 tablet by mouth daily as needed.     metFORMIN (GLUCOPHAGE) 1000 MG tablet Take 1 tablet (1,000 mg total) by mouth 2 (two) times daily with a meal. 180 tablet 2   Multiple Vitamin (MULTIVITAMIN) tablet Take 1 tablet by mouth daily.     polyethylene glycol powder (GLYCOLAX/MIRALAX) 17 GM/SCOOP powder Take 17 g by mouth daily. 3350 g 1   tirzepatide (MOUNJARO) 7.5 MG/0.5ML Pen Inject 7.5 mg into the skin once a week. 6 mL 2   Blood Glucose Monitoring Suppl (SOLUS V2 BLOOD GLUCOSE SYSTEM) DEVI Test 1-2 times a day Dx E11.9 1 each 0   Cyanocobalamin (VITAMIN B-12 PO) Take 1 tablet by mouth daily at 2 PM.     glucose blood (ACCU-CHEK GUIDE) test strip  TEST 1 TO 2 TIMES DAILY 200 strip 1   No current facility-administered medications on file prior to visit.     The following portions of the patient's history were reviewed and updated as appropriate: allergies, current medications, past family history, past medical history, past social history, past surgical history and problem list.  ROS Otherwise as in subjective above  Objective: BP 110/64   Pulse 83   Temp (!) 97.3 F (36.3 C)   Wt (!) 307 lb 9.6 oz (139.5 kg)   LMP  (LMP Unknown)   BMI 49.65 kg/m   Wt Readings from Last 3 Encounters:  09/25/23 (!) 307 lb 9.6 oz (139.5 kg)  09/17/23 (!) 307 lb (139.3 kg)  09/09/23 (!) 307 lb (139.3 kg)    General appearance: alert, no distress, well developed, well nourished Back: Tender lumbar spine region in general, range of motion about 90% normal, no obvious swelling or deformity MSK: Hips with somewhat decreased range of motion internal and external but still about 90% of normal range of motion, otherwise legs nontender without deformity Abdomen: +bs, soft, non tender, non distended, no masses, no hepatomegaly, no splenomegaly Pulses: 2+ radial pulses, 2+ pedal pulses, normal cap refill Ext: no edema Leg strength and sensation normal Negative leg straight leg raise   Assessment: Encounter Diagnoses  Name Primary?   Chronic bilateral low back pain without sciatica Yes   Elevated serum creatinine    Degeneration of intervertebral disc of lumbar region without discogenic back pain or lower extremity pain    Chronic constipation    High risk medication use    Stage 3a chronic kidney disease (HCC)    Advanced directive placed in chart this admission    History of pulmonary embolism      Plan: Chronic low back pain I reviewed your MRI lumbar spine from last year 2023 showing degenerative changes, protruding disc and some foraminal narrowing Stretch daily I wrote your prescription today for a lumbar brace which she can use  as needed I recommend you go see a massage therapist for some therapy You can use over-the-counter Tylenol for pain no more than 2-3 times a day.  Try not to use this every day but you can use either low-dose 325 mg or 500 mg mid dose as needed If you start having worse pain or pain down the legs or numbness or tingling or weakness then recheck  Elevated creatinine kidney marker, history of chronic kidney disease Update basic metabolic lab today  Chronic constipation Improved since going back down to 7.5 mg [employer]  for diabetes Continue MiraLAX to try to use either a half dose or hold dose daily or every other day to see if you can keep your bowels more steady Drink at least 100 ounces of water daily Take a fiber supplement daily  We reviewed your concern for advanced directives.  We changed you back to a full code and not DNR status today  History of pulmonary embolism-continue your anticoagulant Eliquis  Continue your other medicines as usual  [name] was seen today for back pain.  Diagnoses and all orders for this visit:  Chronic bilateral low back pain without sciatica  Elevated serum creatinine -     Basic metabolic panel  Degeneration of intervertebral disc of lumbar region without discogenic back pain or lower extremity pain  Chronic constipation  High risk medication use  Stage 3a chronic kidney disease (HCC)  Advanced directive placed in chart this admission  History of pulmonary embolism  Other orders -     methocarbamol (ROBAXIN) 500 MG tablet; Take 1 tablet (500 mg total) by mouth every 8 (eight) hours as needed for muscle spasms.    Follow up: Pending lab

## 2023-09-25 NOTE — Addendum Note (Signed)
Addended by: [name] on: 09/25/2023 02:35 PM   Modules accepted: Orders

## 2023-09-25 NOTE — Patient Instructions (Addendum)
Chronic low back pain I reviewed your MRI lumbar spine from last year 2023 showing degenerative changes, protruding disc and some foraminal narrowing Stretch daily I wrote your prescription today for a lumbar brace which she can use as needed I recommend you go see a massage therapist for some therapy You can use over-the-counter Tylenol for pain no more than 2-3 times a day.  Try not to use this every day but you can use either low-dose 325 mg or 500 mg mid dose as needed If you start having worse pain or pain down the legs or numbness or tingling or weakness then recheck  Elevated creatinine kidney marker, history of chronic kidney disease Update basic metabolic lab today  Chronic constipation Improved since going back down to 7.5 mg Mounjaro for diabetes Continue MiraLAX to try to use either a half dose or hold dose daily or every other day to see if you can keep your bowels more steady Drink at least 100 ounces of water daily Take a fiber supplement daily  We reviewed your concern for advanced directives.  We changed you back to a full code and not DNR status today  History of pulmonary embolism-continue your anticoagulant Eliquis  Continue your other medicines as usual   Massage therapy:  Kneaded by Rise Paganini [email or web address]   [email or web address]   Sharen Hint, [facility] [phone]

## 2023-09-26 LAB — BASIC METABOLIC PANEL
BUN/Creatinine Ratio: 18 (ref 12–28)
BUN: 25 mg/dL (ref 8–27)
CO2: 23 mmol/L (ref 20–29)
Calcium: 9.7 mg/dL (ref 8.7–10.3)
Chloride: 100 mmol/L (ref 96–106)
Creatinine, Ser: 1.39 mg/dL — ABNORMAL HIGH (ref 0.57–1.00)
Glucose: 180 mg/dL — ABNORMAL HIGH (ref 70–99)
Potassium: 4.9 mmol/L (ref 3.5–5.2)
Sodium: 137 mmol/L (ref 134–144)
eGFR: 41 mL/min/{1.73_m2} — ABNORMAL LOW (ref >59)

## 2023-09-26 NOTE — Progress Notes (Signed)
Results sent through MyChart

## 2023-10-03 DIAGNOSIS — E119 Type 2 diabetes mellitus without complications: Secondary | ICD-10-CM | POA: Diagnosis not present

## 2023-10-03 DIAGNOSIS — H04123 Dry eye syndrome of bilateral lacrimal glands: Secondary | ICD-10-CM | POA: Diagnosis not present

## 2023-10-03 DIAGNOSIS — H5203 Hypermetropia, bilateral: Secondary | ICD-10-CM | POA: Diagnosis not present

## 2023-10-03 DIAGNOSIS — Z135 Encounter for screening for eye and ear disorders: Secondary | ICD-10-CM | POA: Diagnosis not present

## 2023-10-03 DIAGNOSIS — H2513 Age-related nuclear cataract, bilateral: Secondary | ICD-10-CM | POA: Diagnosis not present

## 2023-10-03 LAB — HM DIABETES EYE EXAM

## 2023-10-04 ENCOUNTER — Other Ambulatory Visit: Payer: Self-pay | Admitting: Medical

## 2023-10-04 DIAGNOSIS — I10 Essential (primary) hypertension: Secondary | ICD-10-CM

## 2023-10-16 ENCOUNTER — Telehealth: Payer: Self-pay | Admitting: Medical

## 2023-10-16 NOTE — Telephone Encounter (Signed)
I received an order from [facility].   What is this for? This form requires face to face visit about the requested item.  I don't recall [place] discussing a medical device.  Please inquire

## 2023-10-17 NOTE — Telephone Encounter (Signed)
Pt states that you told her to get a back brace. In your notes, it says she was given and order for it. But she needs it to go through [facility].

## 2023-10-18 NOTE — Telephone Encounter (Signed)
Form has been sent back to shane to sign then needs to be faxed

## 2023-10-19 DIAGNOSIS — M532X6 Spinal instabilities, lumbar region: Secondary | ICD-10-CM | POA: Diagnosis not present

## 2023-12-14 ENCOUNTER — Other Ambulatory Visit: Payer: Self-pay | Admitting: Medical

## 2023-12-15 ENCOUNTER — Other Ambulatory Visit: Payer: Self-pay | Admitting: Medical

## 2023-12-15 DIAGNOSIS — I10 Essential (primary) hypertension: Secondary | ICD-10-CM

## 2023-12-18 ENCOUNTER — Ambulatory Visit
Admission: RE | Admit: 2023-12-18 | Discharge: 2023-12-18 | Disposition: A | Discharge status: Home / Self Care | Payer: 59 | Source: Ambulatory Visit | Attending: Medical | Admitting: Medical

## 2023-12-18 DIAGNOSIS — Z1231 Encounter for screening mammogram for malignant neoplasm of breast: Secondary | ICD-10-CM | POA: Diagnosis not present

## 2024-01-14 ENCOUNTER — Telehealth: Payer: Self-pay

## 2024-01-14 NOTE — Progress Notes (Signed)
Care Guide Pharmacy Note  01/14/2024 Name: [name] MRN: [id number] DOB: 07-20-1951  Referred By: [name], PA-C Reason for referral: Care Coordination (TNM Diabetes.)   Kamila Broda Ledonne is a 73 y.o. year old female who is a primary care patient of Genia Del.  [place]gitt was referred to the pharmacist for assistance related to: DMII  Successful contact was made with the patient to discuss pharmacy services.  Patient declines engagement at this time. Contact information was provided to the patient should they wish to reach out for assistance at a later time.  [facility]  [facility], [facility] Health Care Management Assistant Direct Dial: [phone]  Fax: [phone]

## 2024-01-24 ENCOUNTER — Other Ambulatory Visit: Payer: Self-pay

## 2024-01-24 ENCOUNTER — Encounter (HOSPITAL_COMMUNITY): Payer: Self-pay | Admitting: Emergency Medicine

## 2024-01-24 ENCOUNTER — Inpatient Hospital Stay (HOSPITAL_COMMUNITY)
Admission: EM | Admit: 2024-01-24 | Discharge: 2024-01-28 | DRG: 378 | Disposition: A | Discharge status: Home / Self Care | Payer: 59 | Attending: Internal Medicine | Admitting: Internal Medicine

## 2024-01-24 DIAGNOSIS — Z7984 Long term (current) use of oral hypoglycemic drugs: Secondary | ICD-10-CM

## 2024-01-24 DIAGNOSIS — D62 Acute posthemorrhagic anemia: Secondary | ICD-10-CM | POA: Diagnosis present

## 2024-01-24 DIAGNOSIS — N1831 Chronic kidney disease, stage 3a: Secondary | ICD-10-CM | POA: Diagnosis not present

## 2024-01-24 DIAGNOSIS — Z8719 Personal history of other diseases of the digestive system: Secondary | ICD-10-CM | POA: Diagnosis not present

## 2024-01-24 DIAGNOSIS — Z882 Allergy status to sulfonamides status: Secondary | ICD-10-CM

## 2024-01-24 DIAGNOSIS — K219 Gastro-esophageal reflux disease without esophagitis: Secondary | ICD-10-CM | POA: Diagnosis present

## 2024-01-24 DIAGNOSIS — K922 Gastrointestinal hemorrhage, unspecified: Secondary | ICD-10-CM

## 2024-01-24 DIAGNOSIS — K5730 Diverticulosis of large intestine without perforation or abscess without bleeding: Secondary | ICD-10-CM | POA: Diagnosis not present

## 2024-01-24 DIAGNOSIS — M19071 Primary osteoarthritis, right ankle and foot: Secondary | ICD-10-CM | POA: Diagnosis present

## 2024-01-24 DIAGNOSIS — E038 Other specified hypothyroidism: Secondary | ICD-10-CM | POA: Diagnosis not present

## 2024-01-24 DIAGNOSIS — Z96653 Presence of artificial knee joint, bilateral: Secondary | ICD-10-CM | POA: Diagnosis not present

## 2024-01-24 DIAGNOSIS — M170 Bilateral primary osteoarthritis of knee: Secondary | ICD-10-CM | POA: Diagnosis not present

## 2024-01-24 DIAGNOSIS — K5731 Diverticulosis of large intestine without perforation or abscess with bleeding: Secondary | ICD-10-CM

## 2024-01-24 DIAGNOSIS — D175 Benign lipomatous neoplasm of intra-abdominal organs: Secondary | ICD-10-CM

## 2024-01-24 DIAGNOSIS — Z531 Procedure and treatment not carried out because of patient's decision for reasons of belief and group pressure: Secondary | ICD-10-CM | POA: Diagnosis not present

## 2024-01-24 DIAGNOSIS — Z794 Long term (current) use of insulin: Secondary | ICD-10-CM | POA: Diagnosis not present

## 2024-01-24 DIAGNOSIS — E1159 Type 2 diabetes mellitus with other circulatory complications: Secondary | ICD-10-CM | POA: Diagnosis not present

## 2024-01-24 DIAGNOSIS — I499 Cardiac arrhythmia, unspecified: Secondary | ICD-10-CM | POA: Diagnosis not present

## 2024-01-24 DIAGNOSIS — E781 Pure hyperglyceridemia: Secondary | ICD-10-CM | POA: Diagnosis present

## 2024-01-24 DIAGNOSIS — R6889 Other general symptoms and signs: Secondary | ICD-10-CM | POA: Diagnosis not present

## 2024-01-24 DIAGNOSIS — D122 Benign neoplasm of ascending colon: Secondary | ICD-10-CM | POA: Diagnosis present

## 2024-01-24 DIAGNOSIS — K649 Unspecified hemorrhoids: Secondary | ICD-10-CM

## 2024-01-24 DIAGNOSIS — K5721 Diverticulitis of large intestine with perforation and abscess with bleeding: Principal | ICD-10-CM | POA: Diagnosis present

## 2024-01-24 DIAGNOSIS — K921 Melena: Secondary | ICD-10-CM | POA: Diagnosis not present

## 2024-01-24 DIAGNOSIS — E1169 Type 2 diabetes mellitus with other specified complication: Secondary | ICD-10-CM | POA: Diagnosis present

## 2024-01-24 DIAGNOSIS — E66813 Obesity, class 3: Secondary | ICD-10-CM | POA: Diagnosis present

## 2024-01-24 DIAGNOSIS — R739 Hyperglycemia, unspecified: Secondary | ICD-10-CM | POA: Diagnosis not present

## 2024-01-24 DIAGNOSIS — H9190 Unspecified hearing loss, unspecified ear: Secondary | ICD-10-CM | POA: Diagnosis present

## 2024-01-24 DIAGNOSIS — Z9889 Other specified postprocedural states: Secondary | ICD-10-CM

## 2024-01-24 DIAGNOSIS — Z833 Family history of diabetes mellitus: Secondary | ICD-10-CM

## 2024-01-24 DIAGNOSIS — Z79899 Other long term (current) drug therapy: Secondary | ICD-10-CM

## 2024-01-24 DIAGNOSIS — Z743 Need for continuous supervision: Secondary | ICD-10-CM | POA: Diagnosis not present

## 2024-01-24 DIAGNOSIS — I152 Hypertension secondary to endocrine disorders: Secondary | ICD-10-CM | POA: Diagnosis present

## 2024-01-24 DIAGNOSIS — Z7901 Long term (current) use of anticoagulants: Secondary | ICD-10-CM

## 2024-01-24 DIAGNOSIS — K625 Hemorrhage of anus and rectum: Secondary | ICD-10-CM | POA: Diagnosis not present

## 2024-01-24 DIAGNOSIS — I10 Essential (primary) hypertension: Secondary | ICD-10-CM | POA: Diagnosis not present

## 2024-01-24 DIAGNOSIS — Z7985 Long-term (current) use of injectable non-insulin antidiabetic drugs: Secondary | ICD-10-CM

## 2024-01-24 DIAGNOSIS — M1909 Primary osteoarthritis, other specified site: Secondary | ICD-10-CM | POA: Diagnosis present

## 2024-01-24 DIAGNOSIS — K648 Other hemorrhoids: Secondary | ICD-10-CM | POA: Diagnosis not present

## 2024-01-24 DIAGNOSIS — Z888 Allergy status to other drugs, medicaments and biological substances status: Secondary | ICD-10-CM

## 2024-01-24 DIAGNOSIS — R58 Hemorrhage, not elsewhere classified: Secondary | ICD-10-CM | POA: Diagnosis not present

## 2024-01-24 DIAGNOSIS — Z841 Family history of disorders of kidney and ureter: Secondary | ICD-10-CM

## 2024-01-24 DIAGNOSIS — E1122 Type 2 diabetes mellitus with diabetic chronic kidney disease: Secondary | ICD-10-CM | POA: Diagnosis not present

## 2024-01-24 DIAGNOSIS — Z9851 Tubal ligation status: Secondary | ICD-10-CM

## 2024-01-24 DIAGNOSIS — I2699 Other pulmonary embolism without acute cor pulmonale: Secondary | ICD-10-CM | POA: Diagnosis present

## 2024-01-24 DIAGNOSIS — E119 Type 2 diabetes mellitus without complications: Secondary | ICD-10-CM | POA: Diagnosis not present

## 2024-01-24 DIAGNOSIS — Z6841 Body Mass Index (BMI) 40.0 and over, adult: Secondary | ICD-10-CM

## 2024-01-24 DIAGNOSIS — Z8249 Family history of ischemic heart disease and other diseases of the circulatory system: Secondary | ICD-10-CM

## 2024-01-24 DIAGNOSIS — Z86711 Personal history of pulmonary embolism: Secondary | ICD-10-CM

## 2024-01-24 LAB — COMPREHENSIVE METABOLIC PANEL
ALT: 14 U/L (ref 0–44)
AST: 17 U/L (ref 15–41)
Albumin: 3.5 g/dL (ref 3.5–5.0)
Alkaline Phosphatase: 49 U/L (ref 38–126)
Anion gap: 14 (ref 5–15)
BUN: 22 mg/dL (ref 8–23)
CO2: 22 mmol/L (ref 22–32)
Calcium: 9.6 mg/dL (ref 8.9–10.3)
Chloride: 106 mmol/L (ref 98–111)
Creatinine, Ser: 1.32 mg/dL — ABNORMAL HIGH (ref 0.44–1.00)
GFR, Estimated: 43 mL/min — ABNORMAL LOW (ref >60)
Glucose, Bld: 222 mg/dL — ABNORMAL HIGH (ref 70–99)
Potassium: 4.2 mmol/L (ref 3.5–5.1)
Sodium: 142 mmol/L (ref 135–145)
Total Bilirubin: 0.4 mg/dL (ref 0.0–1.2)
Total Protein: 6.6 g/dL (ref 6.5–8.1)

## 2024-01-24 LAB — CBC WITH DIFFERENTIAL/PLATELET
Abs Immature Granulocytes: 0.03 10*3/uL (ref 0.00–0.07)
Basophils Absolute: 0.1 10*3/uL (ref 0.0–0.1)
Basophils Relative: 1 %
Eosinophils Absolute: 0.1 10*3/uL (ref 0.0–0.5)
Eosinophils Relative: 1 %
HCT: 36.7 % (ref 36.0–46.0)
Hemoglobin: 10.8 g/dL — ABNORMAL LOW (ref 12.0–15.0)
Immature Granulocytes: 0 %
Lymphocytes Relative: 17 %
Lymphs Abs: 1.9 10*3/uL (ref 0.7–4.0)
MCH: 29.4 pg (ref 26.0–34.0)
MCHC: 29.4 g/dL — ABNORMAL LOW (ref 30.0–36.0)
MCV: 100 fL (ref 80.0–100.0)
Monocytes Absolute: 0.6 10*3/uL (ref 0.1–1.0)
Monocytes Relative: 5 %
Neutro Abs: 8.5 10*3/uL — ABNORMAL HIGH (ref 1.7–7.7)
Neutrophils Relative %: 76 %
Platelets: 338 10*3/uL (ref 150–400)
RBC: 3.67 MIL/uL — ABNORMAL LOW (ref 3.87–5.11)
RDW: 14.3 % (ref 11.5–15.5)
WBC: 11.1 10*3/uL — ABNORMAL HIGH (ref 4.0–10.5)
nRBC: 0 % (ref 0.0–0.2)

## 2024-01-24 LAB — FOLATE: Folate: 27.2 ng/mL (ref >5.9)

## 2024-01-24 LAB — PROTIME-INR
INR: 1.1 (ref 0.8–1.2)
Prothrombin Time: 14.4 s (ref 11.4–15.2)

## 2024-01-24 LAB — FERRITIN: Ferritin: 10 ng/mL — ABNORMAL LOW (ref 11–307)

## 2024-01-24 LAB — IRON AND TIBC
Iron: 42 ug/dL (ref 28–170)
Saturation Ratios: 9 % — ABNORMAL LOW (ref 10.4–31.8)
TIBC: 483 ug/dL — ABNORMAL HIGH (ref 250–450)
UIBC: 441 ug/dL

## 2024-01-24 LAB — CBG MONITORING, ED: Glucose-Capillary: 153 mg/dL — ABNORMAL HIGH (ref 70–99)

## 2024-01-24 LAB — GLUCOSE, CAPILLARY: Glucose-Capillary: 184 mg/dL — ABNORMAL HIGH (ref 70–99)

## 2024-01-24 LAB — RETICULOCYTES
Immature Retic Fract: 28.3 % — ABNORMAL HIGH (ref 2.3–15.9)
RBC.: 3.62 MIL/uL — ABNORMAL LOW (ref 3.87–5.11)
Retic Count, Absolute: 66.2 10*3/uL (ref 19.0–186.0)
Retic Ct Pct: 1.8 % (ref 0.4–3.1)

## 2024-01-24 LAB — ABO/RH: ABO/RH(D): O POS

## 2024-01-24 MED ORDER — ACETAMINOPHEN 650 MG RE SUPP: 650.0000 mg | Freq: Four times a day (QID) | RECTAL | Status: DC | PRN

## 2024-01-24 MED ORDER — ONDANSETRON HCL 4 MG PO TABS: 4.0000 mg | ORAL_TABLET | Freq: Four times a day (QID) | ORAL | Status: DC | PRN

## 2024-01-24 MED ORDER — MORPHINE SULFATE (PF) 2 MG/ML IV SOLN: 2.0000 mg | INTRAVENOUS | Status: DC | PRN

## 2024-01-24 MED ORDER — HYDRALAZINE HCL 20 MG/ML IJ SOLN: 5.0000 mg | INTRAMUSCULAR | Status: DC | PRN

## 2024-01-24 MED ORDER — LACTATED RINGERS IV SOLN: INTRAVENOUS | Status: AC

## 2024-01-24 MED ORDER — ACETAMINOPHEN 325 MG PO TABS
650.0000 mg | ORAL_TABLET | Freq: Four times a day (QID) | ORAL | Status: DC | PRN
  Administered 2024-01-25 – 2024-01-27 (×2): 650 mg via ORAL
  Filled 2024-01-24 (×2): qty 2

## 2024-01-24 MED ORDER — ORAL CARE MOUTH RINSE: 15.0000 mL | OROMUCOSAL | Status: DC | PRN

## 2024-01-24 MED ORDER — ONDANSETRON HCL 4 MG/2ML IJ SOLN: 4.0000 mg | Freq: Four times a day (QID) | INTRAMUSCULAR | Status: DC | PRN

## 2024-01-24 MED ORDER — INSULIN ASPART 100 UNIT/ML IJ SOLN
0.0000 [IU] | INTRAMUSCULAR | Status: DC
  Administered 2024-01-24 – 2024-01-25 (×4): 2 [IU] via SUBCUTANEOUS
  Administered 2024-01-25: 3 [IU] via SUBCUTANEOUS
  Administered 2024-01-25: 2 [IU] via SUBCUTANEOUS
  Administered 2024-01-25: 1 [IU] via SUBCUTANEOUS
  Administered 2024-01-26 (×5): 2 [IU] via SUBCUTANEOUS
  Administered 2024-01-27: 3 [IU] via SUBCUTANEOUS
  Administered 2024-01-27 (×2): 2 [IU] via SUBCUTANEOUS
  Administered 2024-01-27: 3 [IU] via SUBCUTANEOUS
  Administered 2024-01-27 – 2024-01-28 (×2): 2 [IU] via SUBCUTANEOUS

## 2024-01-24 NOTE — Assessment & Plan Note (Addendum)
 SCD's . Ambulate out of bed with assistance.  Fall precaution.

## 2024-01-24 NOTE — Assessment & Plan Note (Addendum)
 Resume home meds once diet is restarted and will consider IV replacement if pt has extended stay.

## 2024-01-24 NOTE — Assessment & Plan Note (Addendum)
?   Etiology. D/W pt that she will need GI eval , unfortunately I can't do angio although pt is not presenting with symptoms concerning of active bleed.  We will admit to progressive and monitor with h/h q4 hourly.  GI consult- Message sent - Dr.Pyrtle.  NPO. HOLD anticoagulation / INR WNL.  IV PPI. Type/ screen . D/W pt about need for blood transfusion , and she states she can't receive any blood or blood products due to religious regions.

## 2024-01-24 NOTE — H&P (Signed)
 History and Physical    Patient: [name] FMW:[id number] DOB: 1951/04/14 DOA: 01/24/2024 DOS: the patient was seen and examined on 01/24/2024 PCP: Bulah Alm RAMAN, PA-C  Patient coming from: Home Chief complaint: Chief Complaint  Patient presents with   Rectal Bleeding   HPI:  [name] is a 73 y.o. female with past medical history  of PE on eliquis  , morbid obesity, diabetes mellitus type 2, essential hypertension, hyperlipidemia, GERD, presenting with diarrhea that started today a.m.  Patient reports that the first 2 bowel movements she had were loose and she did not note the color as the lights were off in the bathroom, later on when her diarrhea recurred she noted that the stools were bright red then turned into melanotic stools.  Pt denies any abd pain or any diarrhea or melena or symptoms in past few weeks.  Patient's last colonoscopy was in 2015 patient was last seen by GI in 2023 for chronic diarrhea.  Delay in admission due to lack of blood work.  >>ED Course: In emergency room patient is alert awake oriented in no distress, vitals are stable. Vitals:   01/24/24 1436 01/24/24 1438 01/24/24 1730  BP: 122/65  137/83  Pulse: (!) 105  (!) 101  Temp: 98.6 F (37 C)    Resp: 17  13  Height:  5' 6 (1.676 m)   Weight:  (!) 139.3 kg   SpO2: 99%  99%  TempSrc: Oral    BMI (Calculated):  49.57   ED evaluation  so far shows: CMP shows glucose of 222 creatinine 1.32 EGFR 43. Cbc shows hb of 10.8 and RDW of 14.3 INR of 1.1 EKG ordered and pending.  In the emergency room  pt has received the following treatment thus far: Medications  lactated ringers  infusion (has no administration in time range)  acetaminophen  (TYLENOL ) tablet 650 mg (has no administration in time range)    Or  acetaminophen  (TYLENOL ) suppository 650 mg (has no administration in time range)  morphine  (PF) 2 MG/ML injection 2 mg (has no administration in time range)  ondansetron  (ZOFRAN ) tablet 4 mg  (has no administration in time range)    Or  ondansetron  (ZOFRAN ) injection 4 mg (has no administration in time range)  hydrALAZINE  (APRESOLINE ) injection 5 mg (has no administration in time range)  insulin  aspart (novoLOG ) injection 0-9 Units (has no administration in time range)     Review of Systems  Gastrointestinal:  Positive for blood in stool, diarrhea and melena. Negative for abdominal pain.   Past Medical History:  Diagnosis Date   Abdominal hernia    Acute blood loss as cause of postoperative anemia 02/16/2017   Arthritis    knees, back, R ankle    Bilateral carpal tunnel syndrome 04/29/2012   Diabetes mellitus    Diabetes mellitus type 2 in obese 12/31/2015   Edema    legs   GERD (gastroesophageal reflux disease)    HOH (hard of hearing)    Hypertension    Hypertriglyceridemia 08/30/2015   Started on medication 08/30/15   Lipoma 09/30/2012   Obesity    morbid obesity   OBESITY, MORBID 10/16/2007   Qualifier: Diagnosis of  By: Candia Moats     Osteoarthrosis involving lower leg 10/16/2007   Qualifier: Diagnosis of  By: Candia Moats     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   right   CARPAL TUNNEL RELEASE Left 07/10/2014   Procedure: LEFT ENDOSCOPIC CARPAL TUNNEL RELEASE ;  Surgeon: Alm DELENA Hummer, MD;  Location: [facility];  Service: Orthopedics;  Laterality: Left;   CARPAL TUNNEL RELEASE Right    COLONOSCOPY     HERNIA REPAIR  07/03/2011   abd hernia   NOSE SURGERY  2008   sinus   TOTAL KNEE ARTHROPLASTY Left 02/06/2017   Procedure: LEFT TOTAL KNEE ARTHROPLASTY;  Surgeon: Lonni CINDERELLA Poli, MD;  Location: [facility] OR;  Service: Orthopedics;  Laterality: Left;   TOTAL KNEE ARTHROPLASTY  Right 08/21/2017   TOTAL KNEE ARTHROPLASTY Right 08/21/2017   Procedure: RIGHT TOTAL KNEE ARTHROPLASTY;  Surgeon: Poli Lonni CINDERELLA, MD;  Location: [facility] OR;  Service: Orthopedics;  Laterality: Right;   TUBAL LIGATION      reports that she has never smoked. She has never used smokeless tobacco. She reports current alcohol use. She reports that she does not use drugs.  Allergies  Allergen Reactions   Other Other (See Comments)    NO Blood Transfusions, or giving blood   Crestor [Rosuvastatin] Other (See Comments)    4 prior statins, crestor, lipitor, livalo , bad cramping   Sulfa Antibiotics Rash    Family History  Problem Relation Age of Onset   Hypertension Mother    Kidney disease Mother    Diabetes Father    Hypertension Father    Diabetes Sister    Hypertension Sister    Hypertension Sister    Diabetes Brother    Hypertension Brother    Heart disease Brother 21       heart attack   Colon cancer Neg Hx    Esophageal cancer Neg Hx    Colon polyps Neg Hx    Pancreatic cancer Neg Hx    Rectal cancer Neg Hx    Stomach cancer Neg Hx     Prior to Admission medications   Medication Sig Start Date End Date Taking? Authorizing Provider  allopurinol  (ZYLOPRIM ) 100 MG tablet TAKE 1 TABLET BY MOUTH DAILY 08/14/23  Yes Tysinger, Alm RAMAN, PA-C  apixaban  (ELIQUIS ) 5 MG TABS tablet Take 1 tablet (5 mg total) by mouth 2 (two) times daily. Start after you finish your starter pack Eliquis  Patient taking differently: Take 5 mg by mouth 2 (two) times daily. 10/11/23  Yes Gonfa, Taye T, MD  Cyanocobalamin  (VITAMIN B-12 PO) Take 1 tablet by mouth daily at 2 PM.   Yes [provider]  fenofibrate  (TRICOR ) 145 MG tablet TAKE 1 TABLET(145 MG) BY MOUTH DAILY 12/14/23  Yes Tysinger, Alm RAMAN, PA-C  losartan -hydrochlorothiazide  (HYZAAR) 100-12.5 MG tablet TAKE 1 TABLET BY MOUTH DAILY 12/17/23  Yes Tysinger, Alm RAMAN, PA-C  MAGNESIUM  CITRATE PO Take 1 tablet by mouth daily as needed.   Yes  [provider]  metFORMIN  (GLUCOPHAGE ) 1000 MG tablet Take 1 tablet (1,000 mg total) by mouth 2 (two) times daily with a meal. 09/17/23  Yes Tysinger, Alm RAMAN, PA-C  methocarbamol  (ROBAXIN ) 500 MG tablet Take 1 tablet (500 mg total) by mouth every 8 (eight) hours as needed for muscle spasms. 09/25/23  Yes Tysinger, Alm RAMAN, PA-C  Multiple Vitamin (MULTIVITAMIN) tablet Take 1 tablet by mouth daily.   Yes [provider]  polyethylene glycol powder (GLYCOLAX /MIRALAX ) 17 GM/SCOOP powder Take 17 g by mouth daily.  Patient taking differently: Take 17 g by mouth daily as needed for moderate constipation. 09/17/23  Yes Tysinger, Alm RAMAN, PA-C  tirzepatide  (MOUNJARO ) 7.5 MG/0.5ML Pen Inject 7.5 mg into the skin once a week. Patient taking differently: Inject 7.5 mg into the skin once a week. On Thursdays 09/17/23  Yes Tysinger, Alm RAMAN, PA-C  Accu-Chek Softclix Lancets lancets TEST 1 TO 2 TIMES DAILY 09/20/20   Henson, Vickie L, NP-C  Blood Glucose Monitoring Suppl (SOLUS V2 BLOOD GLUCOSE SYSTEM) DEVI Test 1-2 times a day Dx E11.9 08/10/20   Henson, Vickie L, NP-C  glucose blood (ACCU-CHEK GUIDE) test strip TEST 1 TO 2 TIMES DAILY 12/13/21   Joyce Norleen BROCKS, MD     Vitals:   01/24/24 1436 01/24/24 1438 01/24/24 1730  BP: 122/65  137/83  Pulse: (!) 105  (!) 101  Resp: 17  13  Temp: 98.6 F (37 C)    TempSrc: Oral    SpO2: 99%  99%  Weight:  (!) 139.3 kg   Height:  5' 6 (1.676 m)    Physical Exam Vitals and nursing note reviewed.  Constitutional:      General: She is not in acute distress. HENT:     Head: Normocephalic and atraumatic.     Right Ear: Hearing normal.     Left Ear: Hearing normal.     Nose: Nose normal. No nasal deformity.     Mouth/Throat:     Lips: Pink.     Tongue: No lesions.     Pharynx: Oropharynx is clear.  Eyes:     General: Lids are normal.     Extraocular Movements: Extraocular movements intact.  Cardiovascular:     Rate and Rhythm: Normal rate  and regular rhythm.     Heart sounds: Normal heart sounds.  Pulmonary:     Effort: Pulmonary effort is normal.     Breath sounds: Normal breath sounds.  Abdominal:     General: Bowel sounds are normal. There is no distension.     Palpations: Abdomen is soft. There is no mass.     Tenderness: There is no abdominal tenderness.  Musculoskeletal:     Right lower leg: No edema.     Left lower leg: No edema.  Skin:    General: Skin is warm.  Neurological:     General: No focal deficit present.     Mental Status: She is alert and oriented to person, place, and time.     Cranial Nerves: Cranial nerves 2-12 are intact.  Psychiatric:        Attention and Perception: Attention normal.        Mood and Affect: Mood normal.        Speech: Speech normal.        Behavior: Behavior normal. Behavior is cooperative.      Labs on Admission: I have personally reviewed following labs and imaging studies Results for orders placed or performed during the hospital encounter of 01/24/24 (from the past 24 hours)  CBC with Differential     Status: Abnormal   Collection Time: 01/24/24  5:51 PM  Result Value Ref Range   WBC 11.1 (H) 4.0 - 10.5 K/uL   RBC 3.67 (L) 3.87 - 5.11 MIL/uL   Hemoglobin 10.8 (L) 12.0 - 15.0 g/dL   HCT 63.2 63.9 - 53.9 %   MCV 100.0 80.0 - 100.0 fL   MCH 29.4 26.0 - 34.0 pg   MCHC 29.4 (L) 30.0 - 36.0 g/dL  RDW 14.3 11.5 - 15.5 %   Platelets 338 150 - 400 K/uL   nRBC 0.0 0.0 - 0.2 %   Neutrophils Relative % 76 %   Neutro Abs 8.5 (H) 1.7 - 7.7 K/uL   Lymphocytes Relative 17 %   Lymphs Abs 1.9 0.7 - 4.0 K/uL   Monocytes Relative 5 %   Monocytes Absolute 0.6 0.1 - 1.0 K/uL   Eosinophils Relative 1 %   Eosinophils Absolute 0.1 0.0 - 0.5 K/uL   Basophils Relative 1 %   Basophils Absolute 0.1 0.0 - 0.1 K/uL   Immature Granulocytes 0 %   Abs Immature Granulocytes 0.03 0.00 - 0.07 K/uL  Comprehensive metabolic panel     Status: Abnormal   Collection Time: 01/24/24  5:51 PM   Result Value Ref Range   Sodium 142 135 - 145 mmol/L   Potassium 4.2 3.5 - 5.1 mmol/L   Chloride 106 98 - 111 mmol/L   CO2 22 22 - 32 mmol/L   Glucose, Bld 222 (H) 70 - 99 mg/dL   BUN 22 8 - 23 mg/dL   Creatinine, Ser 8.67 (H) 0.44 - 1.00 mg/dL   Calcium  9.6 8.9 - 10.3 mg/dL   Total Protein 6.6 6.5 - 8.1 g/dL   Albumin 3.5 3.5 - 5.0 g/dL   AST 17 15 - 41 U/L   ALT 14 0 - 44 U/L   Alkaline Phosphatase 49 38 - 126 U/L   Total Bilirubin 0.4 0.0 - 1.2 mg/dL   GFR, Estimated 43 (L) >60 mL/min   Anion gap 14 5 - 15  Protime-INR     Status: None   Collection Time: 01/24/24  5:51 PM  Result Value Ref Range   Prothrombin Time 14.4 11.4 - 15.2 seconds   INR 1.1 0.8 - 1.2  Reticulocytes     Status: Abnormal   Collection Time: 01/24/24  6:32 PM  Result Value Ref Range   Retic Ct Pct 1.8 0.4 - 3.1 %   RBC. 3.62 (L) 3.87 - 5.11 MIL/uL   Retic Count, Absolute 66.2 19.0 - 186.0 K/uL   Immature Retic Fract 28.3 (H) 2.3 - 15.9 %   No results found for this or any previous visit (from the past 720 hours). CBC:    Latest Ref Rng & Units 01/24/2024    5:51 PM 09/17/2023    3:08 PM 09/11/2023    4:26 AM  CBC  WBC 4.0 - 10.5 K/uL 11.1  7.6  7.2   Hemoglobin 12.0 - 15.0 g/dL 89.1  88.6  89.0   Hematocrit 36.0 - 46.0 % 36.7  36.6  35.2   Platelets 150 - 400 K/uL 338  370  238    Basic Metabolic Panel: Recent Labs  Lab 01/24/24 1751  NA 142  K 4.2  CL 106  CO2 22  GLUCOSE 222*  BUN 22  CREATININE 1.32*  CALCIUM  9.6   Creatinine: Lab Results  Component Value Date   CREATININE 1.32 (H) 01/24/2024   CREATININE 1.39 (H) 09/25/2023   CREATININE 1.65 (H) 09/17/2023   Liver Function Tests:    Latest Ref Rng & Units 01/24/2024    5:51 PM 09/17/2023    3:08 PM 09/11/2023    4:26 AM  Hepatic Function  Total Protein 6.5 - 8.1 g/dL 6.6  7.0    Albumin 3.5 - 5.0 g/dL 3.5  4.0  3.1   AST 15 - 41 U/L 17  14    ALT 0 -  44 U/L 14  13    Alk Phosphatase 38 - 126 U/L 49  66    Total Bilirubin  0.0 - 1.2 mg/dL 0.4  0.2     Coagulation Profile: Recent Labs  Lab 01/24/24 1751  INR 1.1   Radiological Exams on Admission: No results found.  Data Reviewed: Relevant notes from primary care and specialist visits, past discharge summaries as available in EHR, including Care Everywhere. Prior diagnostic testing as pertinent to current admission diagnoses, Updated medications and problem lists for reconciliation ED course, including vitals, labs, imaging, treatment and response to treatment,Triage notes, nursing and pharmacy notes and ED provider's notes Notable results as noted in HPI.Discussed case with EDMD/ ED APP/ or Specialty MD on call and as needed. Assessment & Plan Melena ? Etiology. D/W pt that she will need GI eval , unfortunately I can't do angio although pt is not presenting with symptoms concerning of active bleed.  We will admit to progressive and monitor with h/h q4 hourly.  GI consult- Message sent - Dr.Pyrtle.  NPO. HOLD anticoagulation / INR WNL.  IV PPI. Type/ screen . D/W pt about need for blood transfusion , and she states she can't receive any blood or blood products due to religious regions.   ABLA (acute blood loss anemia) 2/2 Eliquis .  Anemia panel.  Pulmonary embolism (HCC) SCD's . Ambulate out of bed with assistance.  Fall precaution.   Hypertension associated with diabetes (HCC) Vitals:   01/24/24 1436 01/24/24 1730  BP: 122/65 137/83  HOLD BP meds.  Prn hydralazine   if needed after manual bp.    Type 2 diabetes mellitus with obesity (HCC) NPO. Glycemic protocol q 4 hourly.   Subclinical hypothyroidism  Resume home meds once diet is restarted and will consider IV replacement if pt has extended stay.   DVT prophylaxis:  SCD's Consults:  Gastro: Dr.Pyrtle.  Advance Care Planning:    Code Status: Full Code   Family Communication:  None  Disposition Plan:  Home  Severity of Illness: The appropriate patient status for this patient  is INPATIENT. Inpatient status is judged to be reasonable and necessary in order to provide the required intensity of service to ensure the patient's safety. The patient's presenting symptoms, physical exam findings, and initial radiographic and laboratory data in the context of their chronic comorbidities is felt to place them at high risk for further clinical deterioration. Furthermore, it is not anticipated that the patient will be medically stable for discharge from the hospital within 2 midnights of admission.   * I certify that at the point of admission it is my clinical judgment that the patient will require inpatient hospital care spanning beyond 2 midnights from the point of admission due to high intensity of service, high risk for further deterioration and high frequency of surveillance required.*  Author: Mario LULLA Blanch, MD 01/24/2024 8:02 PM  For on call review [email or web address].   Unresulted Labs (From admission, onward)     Start     Ordered   01/24/24 2000  ABO/Rh  Once,   R        01/24/24 2000   01/24/24 1832  Vitamin B12  (Anemia Panel (PNL))  Add-on,   AD        01/24/24 1832   01/24/24 1832  Folate  (Anemia Panel (PNL))  Add-on,   AD        01/24/24 1832   01/24/24 1832  Iron  and TIBC  (Anemia Panel (PNL))  Add-on,  AD        01/24/24 1832   01/24/24 1832  Ferritin  (Anemia Panel (PNL))  Add-on,   AD        01/24/24 1832   01/24/24 1502  Type and screen [facility]  Once,   STAT       Comments: [facility]    01/24/24 1501            Orders Placed This Encounter  Procedures   CBC with Differential   Comprehensive metabolic panel   Protime-INR   Vitamin B12   Folate   Iron  and TIBC   Ferritin   Reticulocytes   Diet NPO time specified   Informed Blood Refusal: Physician/Practitioner Attestation; Transcribe to Blood Refusal form and obtain patient signature   SCDs   Cardiac Monitoring Continuous x 24 hours Indications for use:  Other; other indications for use: Monitor for ischemia   Vital signs   Notify physician (specify)   Refer to Sidebar Report Refer to ICU, Med-Surg, Progressive, and Step-Down Mobility Protocol Sidebars   Initiate Adult Central Line Maintenance and Catheter Protocol for patients with central line (CVC, PICC, Port, Hemodialysis, Trialysis)   If patient diabetic or glucose greater than 140 notify physician for Sliding Scale Insulin  Orders   Intake and Output   Do not place and if present remove PureWick   Initiate Oral Care Protocol   Initiate Carrier Fluid Protocol   RN may order General Admission PRN Orders utilizing General Admission PRN medications (through manage orders) for the following patient needs: allergy  symptoms (Claritin ), cold sores (Carmex), cough (Robitussin DM), eye irritation (Liquifilm Tears), hemorrhoids (Tucks), indigestion (Maalox), minor skin irritation (Hydrocortisone Cream), muscle pain (Ben Gay), nose irritation (saline nasal spray) and sore throat (Chloraseptic spray).   Bed rest   Full code   Consult to hospitalist   Consult to gastroenterology Consult Timeframe: ROUTINE - requires response within 24 hours; Reason for Consult? melena / ABLA   Pulse oximetry check with vital signs   Oxygen therapy Mode or (Route): Nasal cannula; Liters Per Minute: 2; Keep O2 saturation between: greater than 92 %   Type and screen [facility]   ABO/Rh   Insert peripheral IV   Admit to Inpatient (patient's expected length of stay will be greater than 2 midnights or inpatient only procedure)   Aspiration precautions   Fall precautions

## 2024-01-24 NOTE — Assessment & Plan Note (Addendum)
 Vitals:   01/24/24 1436 01/24/24 1730  BP: 122/65 137/83  HOLD BP meds.  Prn hydralazine   if needed after manual bp.

## 2024-01-24 NOTE — ED Provider Notes (Signed)
 [place] EM[facility]GENCY DEPARTMENT AT [facility] Provider Note  MDM   HPI/ROS:  [name] is a 73 y.o. female with pertinent past medical history of PE on Eliquis , DM-2, CKD-3A, HTN, morbid obesity, osteoarthritis, b/l TKA, goiter and chronic edema who presents for rectal bleeding.  Patient reports she has had several episodes of diarrhea today.  Initially she noted diarrhea was pink-tinged, however the past 2-3 episodes have been grossly bloody with dark red blood.  She has not noticed any clots.  Believes that it is mixed in with stool.  She notes mild associated abdominal cramping but otherwise denies any vomiting, chest pain, dyspnea, lightheadedness, dizziness, syncope, dysuria, hematuria.  Last dose of Eliquis  was this morning.  Of note, patient is a Scientist, Product/process Development.  She declines receiving any blood products though is potentially open to discussing blood alternatives if medically indicated.  Last dose of Eliquis  was this morning.   VS BP 160/116, HR 108, Resp 20, SpO2 100%, CBG 293 Physical exam is notable for: - Rectal exam with no hemorrhoids, anal fissures, grossly positive with dark red blood. - No subconjunctival pallor  On my initial evaluation, patient is:  -Vital signs stable. Patient afebrile, mildly tachycardic but otherwise hemodynamically stable, and non-toxic appearing. -Additional history obtained from review of prior records  This patient's current presentation, including their history and physical exam, is most consistent with GI bleed, likely lower. Differentials include upper GI bleed, lower suspicion for diverticulitis given lack of localized abdominal pain, less likely infectious given lack of fevers or other infective symptoms, less likely mesenteric ischemia given lack of abdominal pain.   Plan to obtain CBC, CMP, type and screen.  No indication for abdominal imaging at this time.  Last colonoscopy was in 2015.  Did discuss possible blood transfusion  with patient, but she declined at this time given her identity as a Scientist, Product/process Development.  She did state that if she experiences increased bleeding or labs are indicative of need for transfusion, she would be willing to discuss potentially receiving blood components pending further discussion with her as well as a friend over the phone.   Interpretations, interventions, and the patient's course of care are documented below.    -Labs reviewed: WBC 11.1, hemoglobin 10.8, platelets 338, creatinine 1.32 (stable from prior), serum glucose 222, INR 1.1 -Patient had additional large bloody bowel movement while in the ED, reevaluated after this time with continued normal vital signs and no other symptoms -Engaged in repeat discussions regarding potential need for future blood transfusions with patient and her religious advisor Brother Ozell via telephone, patient reports that she has a card indicating which blood product alternative she is open to receiving, however she left this at home and cannot recall.  Based on our discussion she is open to receiving potential blood alternatives at this time, but continues to decline any whole blood or blood products due to religious identification.  -Considered but actually reversing patient's Eliquis  given she is declining any blood products if her symptoms were to worsen, however ultimately favored not indicated as patient continues to have stable vital signs and current hemoglobin stable from 4 months ago (11.3 > 10.8).  Will defer to inpatient team  Disposition:  I discussed the case with Dr. Tobie with the hospitalist team who graciously agreed to admit the patient to their service for continued care.  Please see their note for further treatment plan details.  Patient remained stable and had no acute events under my care  in the emergency department.  Clinical Impression:  1. Rectal bleeding   2. Acute GI bleeding     Rx / DC Orders ED Discharge Orders     None        The plan for this patient was discussed with Dr. Charlyn, who voiced agreement and who oversaw evaluation and treatment of this patient.   Clinical Complexity A medically appropriate history, review of systems, and physical exam was performed.  My independent interpretations of EKG, labs, and radiology are documented in the ED course above.   If decision rules were used in this patient's evaluation, they are listed below.   Patient's presentation is most consistent with acute presentation with potential threat to life or bodily function.  Medical Decision Making Amount and/or Complexity of Data Reviewed Labs: ordered.  Risk Decision regarding hospitalization.    HPI/ROS      See MDM section for pertinent HPI and ROS. A complete ROS was performed with pertinent positives/negatives noted above.   Past Medical History:  Diagnosis Date   Abdominal hernia    Acute blood loss as cause of postoperative anemia 02/16/2017   Arthritis    knees, back, R ankle    Bilateral carpal tunnel syndrome 04/29/2012   Diabetes mellitus    Diabetes mellitus type 2 in obese 12/31/2015   Edema    legs   GERD (gastroesophageal reflux disease)    HOH (hard of hearing)    Hypertension    Hypertriglyceridemia 08/30/2015   Started on medication 08/30/15   Lipoma 09/30/2012   Obesity    morbid obesity   OBESITY, MORBID 10/16/2007   Qualifier: Diagnosis of  By: Candia Moats     Osteoarthrosis involving lower leg 10/16/2007   Qualifier: Diagnosis of  By: Candia Moats     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   right   CARPAL TUNNEL RELEASE Left 07/10/2014   Procedure: LEFT ENDOSCOPIC CARPAL TUNNEL RELEASE ;  Surgeon: Alm DELENA Hummer, MD;  Location: [facility];  Service: Orthopedics;  Laterality: Left;   CARPAL TUNNEL RELEASE Right    COLONOSCOPY     HERNIA REPAIR  07/03/2011   abd hernia   NOSE SURGERY  2008   sinus   TOTAL KNEE ARTHROPLASTY Left 02/06/2017   Procedure: LEFT TOTAL KNEE ARTHROPLASTY;  Surgeon: Lonni CINDERELLA Poli, MD;  Location[facility];  Service: Orthopedics;  Laterality: Left;   TOTAL KNEE ARTHROPLASTY Right 08/21/2017   TOTAL KNEE ARTHROPLASTY Right 08/21/2017   Procedure: RIGHT TOTAL KNEE ARTHROPLASTY;  Surgeon: Poli Lonni CINDERELLA, MD;  Location: [facility] OR;  Service: Orthopedics;  Laterality: Right;   TUBAL LIGATION        Physical Exam   Vitals:   01/24/24 1436 01/24/24 1438  BP: 122/65   Pulse: (!) 105   Resp: 17   Temp: 98.6 F (37 C)   TempSrc: Oral   SpO2: 99%   Weight:  (!) 139.3 kg  Height:  5' 6 (1.676 m)    Physical Exam Gen: NAD. Appears comfortable HENT: Conjunctiva clear with no subconjunctival pallor, PERRL, EOMI. MMM.  CV: RRR. No M/R/G Pulm: Lungs CTAB with no wheezing, rales, or rhonchi.  GI: Abdomen soft, non-tender, non-distended.  Normal bowel sounds in all 4 quadrants. Rectal: No hemorrhoids or anal fissures, rectal exam with small amount of grossly bloody output.  No melena or clots noted. MSK/Skin: No lower extremity edema. Extremities warm, well-perfused with 2+ pulses in all 4 extremities. Neuro: A&Ox3. GCS 15. Moves all extremities.     Procedures   If procedures were preformed on this patient, they are listed below:  Procedures   Laverda Rimes, MD Emergency Medicine PGY-2   Please note that this documentation was produced with the assistance of voice-to-text technology and may contain errors.    Rimes Laverda, MD 01/24/24 [id number]    Charlyn Sora, MD 01/24/24 [phone]

## 2024-01-24 NOTE — Assessment & Plan Note (Addendum)
 2/2 Eliquis .  Anemia panel.

## 2024-01-24 NOTE — Assessment & Plan Note (Addendum)
 NPO. Glycemic protocol q 4 hourly.

## 2024-01-24 NOTE — ED Triage Notes (Signed)
 Pt BIB [facility] from home due to bloody stools.  Pt reports since 0700 today she has had 5 bowel movements that started with bright red blood and then turned into dark red blood.  Pt reports no pain but does take Eliquis . VS BP 160/116, HR 108, Resp 20, SpO2 100%, CBG 293

## 2024-01-25 ENCOUNTER — Ambulatory Visit: Payer: 59 | Admitting: Medical

## 2024-01-25 DIAGNOSIS — K625 Hemorrhage of anus and rectum: Principal | ICD-10-CM

## 2024-01-25 DIAGNOSIS — K921 Melena: Secondary | ICD-10-CM | POA: Diagnosis not present

## 2024-01-25 DIAGNOSIS — D62 Acute posthemorrhagic anemia: Secondary | ICD-10-CM | POA: Diagnosis not present

## 2024-01-25 LAB — NO BLOOD PRODUCTS

## 2024-01-25 LAB — HEMOGLOBIN AND HEMATOCRIT, BLOOD
HCT: 30.2 % — ABNORMAL LOW (ref 36.0–46.0)
HCT: 29 % — ABNORMAL LOW (ref 36.0–46.0)
HCT: 29.9 % — ABNORMAL LOW (ref 36.0–46.0)
Hemoglobin: 9.3 g/dL — ABNORMAL LOW (ref 12.0–15.0)
Hemoglobin: 8.9 g/dL — ABNORMAL LOW (ref 12.0–15.0)
Hemoglobin: 9.1 g/dL — ABNORMAL LOW (ref 12.0–15.0)

## 2024-01-25 LAB — MRSA NEXT GEN BY PCR, NASAL: MRSA by PCR Next Gen: NOT DETECTED

## 2024-01-25 LAB — GLUCOSE, CAPILLARY
Glucose-Capillary: 157 mg/dL — ABNORMAL HIGH (ref 70–99)
Glucose-Capillary: 174 mg/dL — ABNORMAL HIGH (ref 70–99)
Glucose-Capillary: 168 mg/dL — ABNORMAL HIGH (ref 70–99)
Glucose-Capillary: 133 mg/dL — ABNORMAL HIGH (ref 70–99)
Glucose-Capillary: 203 mg/dL — ABNORMAL HIGH (ref 70–99)
Glucose-Capillary: 119 mg/dL — ABNORMAL HIGH (ref 70–99)

## 2024-01-25 LAB — VITAMIN B12: Vitamin B-12: 1206 pg/mL — ABNORMAL HIGH (ref 180–914)

## 2024-01-25 MED ORDER — SODIUM CHLORIDE 0.9 % IV SOLN
100.0000 mg | Freq: Once | INTRAVENOUS | Status: AC
  Administered 2024-01-25: 100 mg via INTRAVENOUS
  Filled 2024-01-25: qty 5

## 2024-01-25 NOTE — Progress Notes (Signed)
 Pt had a large bloody BM in the BR. Pt stated "I felt some warm sensation in my stomach then I had to go". Pt VSS; MD notified and report given to oncoming RN. P. Amo Teshia Mahone RN

## 2024-01-25 NOTE — Progress Notes (Signed)
 PROGRESS NOTE  [name] FMW:[id number] DOB: 1951-03-02 DOA: 01/24/2024 PCP: Bulah Alm RAMAN, PA-C   LOS: 1 day   Brief narrative:  [name] is a 73 y.o. female with past medical history  of PE on eliquis  , morbid obesity, diabetes mellitus type 2, essential hypertension, hyperlipidemia, GERD, presented hospital with loose stools subsequently noted to have bright red blood followed by melanotic stools.  No abdominal pain.  History of colonoscopy 2015.  In the ED, vitals were stable.  CBC showed hemoglobin of 10.8 with INR of 1.1.  BMP with glucose of 222 with creatinine elevation at 1.3. Patient received Ringer lactate, Tylenol , Zofran  and was admitted hospital for GI bleed.   Assessment/Plan: Principal Problem:   Melena Active Problems:   ABLA (acute blood loss anemia)   Pulmonary embolism (HCC)   Hypertension associated with diabetes (HCC)   Type 2 diabetes mellitus with obesity (HCC)   Subclinical hypothyroidism  GI bleed with melena. GI has been consulted.  Has had bowel movement this morning with blood around 7 AM.  Currently NPO.  IV PPI.  Unable to receive blood transfusion due to being Jehov[employer]'s Witness.  Previous colonoscopy 2015 with history of diverticulosis and internal hemorrhoids.  Last dose of Eliquis  morning of 01/24/2024.  General exam in the ED with no hemorrhoids but grossly positive with dark stools.  Latest hemoglobin of 9.3 from initial 10.8.  Acute blood loss anemia.  Secondary to being on Eliquis  and GI bleed.  Will continue to monitor support.  Hold off with Eliquis .  Patient is .  Anemia panel with vitamin B12 at 12.6.  Folic acid 27.2.  Iron  of 42 and ferritin very low at 10.  Will give 1 and dose of IV Venofer .  Consider blood if possible.  Hemoglobin and hematocrit every 12.  History of pulmonary embolism.  Hold off with Eliquis  due to GI bleed.  Will need to restart when able.  Patient did have PEs and DVT September 2024.  Essential hypertension.   Will closely monitor blood pressure.  Resume outpatient medication..  Hold antihypertensives for now.  Will consider as needed hydralazine  for now..  Diabetes mellitus type 2.  Continue sliding scale insulin  Accu-Cheks.  Hemoglobin A1c of 8.6 on 08/14/2023.  Class III obesity.Body mass index is 49.55 kg/m.   Would benefit from weight loss as outpatient.  DVT prophylaxis: SCDs Start: 01/24/24 1956   Disposition: Home likely in 1 to 2 days  Status is: Inpatient Remains inpatient appropriate because: GI bleed, need for further observation and possible evaluation by GI    Code Status:     Code Status: Full Code  Family Communication: None at bedside  Consultants: GI  Procedures: None yet  Anti-infectives:  None  Anti-infectives (From admission, onward)    None        Subjective: Today, patient was seen and examined at bedside.  Patient feels hungry.  Denies any nausea, vomiting, abdominal pain.  Has had bright red blood this morning at around 7 AM.  Denies any dizziness, lightheadedness   Objective: Vitals:   01/25/24 0337 01/25/24 0733  BP: 133/88 129/73  Pulse: 94 89  Resp: 13 17  Temp: 98.9 F (37.2 C) 98.8 F (37.1 C)  SpO2: 98% 99%    Intake/Output Summary (Last 24 hours) at 01/25/2024 1015 Last data filed at 01/25/2024 0900 Gross per 24 hour  Intake 565 ml  Output --  Net 565 ml   Filed Weights   01/24/24  1438  Weight: (!) 139.3 kg   Body mass index is 49.55 kg/m.   Physical Exam:  GENERAL: Patient is alert awake and oriented. Not in obvious distress.  Morbidly obese HENT: Mild pallor noted. Pupils equally reactive to light. Oral mucosa is moist NECK: is supple, no gross swelling noted. CHEST: Clear to auscultation. No crackles or wheezes.   CVS: S1 and S2 heard, no murmur. Regular rate and rhythm.  ABDOMEN: Soft, non-tender, bowel sounds are present. EXTREMITIES: No edema. CNS: Cranial nerves are intact. No focal motor deficits. SKIN: warm  and dry without rashes.  Data Review: I have personally reviewed the following laboratory data and studies,  CBC: Recent Labs  Lab 01/24/24 1751 01/25/24 0124  WBC 11.1*  --   NEUTROABS 8.5*  --   HGB 10.8* 9.3*  HCT 36.7 30.2*  MCV 100.0  --   PLT 338  --    Basic Metabolic Panel: Recent Labs  Lab 01/24/24 1751  NA 142  K 4.2  CL 106  CO2 22  GLUCOSE 222*  BUN 22  CREATININE 1.32*  CALCIUM  9.6   Liver Function Tests: Recent Labs  Lab 01/24/24 1751  AST 17  ALT 14  ALKPHOS 49  BILITOT 0.4  PROT 6.6  ALBUMIN 3.5   No results for input(s): LIPASE, AMYLASE in the last 168 hours. No results for input(s): AMMONIA in the last 168 hours. Cardiac Enzymes: No results for input(s): CKTOTAL, CKMB, CKMBINDEX, TROPONINI in the last 168 hours. BNP (last 3 results) Recent Labs    09/09/23 1103  BNP 167.4*    ProBNP (last 3 results) No results for input(s): PROBNP in the last 8760 hours.  CBG: Recent Labs  Lab 01/24/24 2140 01/24/24 2316 01/25/24 0338 01/25/24 0731  GLUCAP 153* 184* 157* 174*   Recent Results (from the past 240 hours)  MRSA Next Gen by PCR, Nasal     Status: None   Collection Time: 01/25/24 12:26 AM   Specimen: Nasal Mucosa; Nasal Swab  Result Value Ref Range Status   MRSA by PCR Next Gen NOT DETECTED NOT DETECTED Final    Comment: (NOTE) The GeneXpert MRSA Assay (FDA approved for NASAL specimens only), is one component of a comprehensive MRSA colonization surveillance program. It is not intended to diagnose MRSA infection nor to guide or monitor treatment for MRSA infections. Test performance is not FDA approved in patients less than 26 years old. Performed at [facility], [address] [place], [place] [address]      Studies: No results found.    Envi Eagleson, MD  [facility] 01/25/2024  If 7PM-7AM, please contact night-coverage

## 2024-01-25 NOTE — Consult Note (Signed)
 Consultation Note   Referring Provider:  [facility] PCP: Bulah Alm RAMAN, PA-C Primary Gastroenterologist: Sandor Flatter, MD        Reason for Consultation: melena  DOA: 01/24/2024         Hospital Day: 2   ASSESSMENT    Brief Narrative:  73 y.o. year old female with a history of PE on eliquis  ,  morbid obesity, DM2, HTN, HLD, CKD 3, GERD, diverticulosis. Admitted with painless hematochezia.   Hemodynamically stable GI bleed with painless hematochezia on Eliquis .  Probably a diverticular hemorrhage. Currently only a scant amount of medium colored blood in vault on DRE  Acute on chronic anemia 2/2 to GI bleed.  Presenting hgb 10.8 (Baseline 10-11), now down to 8.9. Normal BUN  History of PE, on Eliquis  Last dose of Eliquis  was 01/24/24 am  Obesity / DM.  On mounjaro   Jehovah's Witness No blood transfusions   Principal Problem:   Melena Active Problems:   Hypertension associated with diabetes (HCC)   Type 2 diabetes mellitus with obesity (HCC)   Subclinical hypothyroidism   ABLA (acute blood loss anemia)   Pulmonary embolism (HCC)      PLAN:   --If she has another episode of hematochezia then will likely send for CTA . CKD stage 3 is noted but hgb declining and she doesn't accept blood products. --Hopefully bleeding stops spontaneously and we can arrange for a more elective colonoscopy (last one was in 2015).  --Getting QQ 12 H/H, next one due around 5p --Clear liquids okay from Gi standpoint  HPI   Patient admitted yesterday with bloody stools on Eliquis . Bleeding started early yesterday morning. Doesn't usually have diarrhea but around 6 am felt need to have BM. Had loose stool but doesn't know if contained blood ( didn't turn on bathroom light). Around 8 am had another episode of loose stool, that time with red blood. Had a couple more episodes of diarrhea but with more darker red blood. Came to ED. Says she  had two more episodes of hematochezia in ED. She has had two episodes this am but described blood as lighter red in color.  She hasn't had any associated N/V/abdominal pain or rectal pain. No other GI complaints.   Previous GI Evaluations   Colonoscopy March 2015 Large lipoma in proximal transverse colon  Left sided diverticulosis hemorrhoids  Labs and Imaging: Recent Labs    01/24/24 1751 01/25/24 0124 01/25/24 1118  WBC 11.1*  --   --   HGB 10.8* 9.3* 8.9*  HCT 36.7 30.2* 29.0*  PLT 338  --   --    Recent Labs    01/24/24 1751  NA 142  K 4.2  CL 106  CO2 22  GLUCOSE 222*  BUN 22  CREATININE 1.32*  CALCIUM  9.6   Recent Labs    01/24/24 1751  PROT 6.6  ALBUMIN 3.5  AST 17  ALT 14  ALKPHOS 49  BILITOT 0.4   No results for input(s): HEPBSAG, HCVAB, HEPAIGM, HEPBIGM in the last 72 hours. Recent Labs    01/24/24 1751  LABPROT 14.4  INR 1.1      Past Medical History:  Diagnosis Date   Abdominal hernia    Acute blood  loss as cause of postoperative anemia 02/16/2017   Arthritis    knees, back, R ankle    Bilateral carpal tunnel syndrome 04/29/2012   Diabetes mellitus    Diabetes mellitus type 2 in obese 12/31/2015   Edema    legs   GERD (gastroesophageal reflux disease)    HOH (hard of hearing)    Hypertension    Hypertriglyceridemia 08/30/2015   Started on medication 08/30/15   Lipoma 09/30/2012   Obesity    morbid obesity   OBESITY, MORBID 10/16/2007   Qualifier: Diagnosis of  By: Candia Moats     Osteoarthrosis involving lower leg 10/16/2007   Qualifier: Diagnosis of  By: Candia Moats     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   right   CARPAL TUNNEL RELEASE Left 07/10/2014    Procedure: LEFT ENDOSCOPIC CARPAL TUNNEL RELEASE ;  Surgeon: Alm DELENA Hummer, MD;  Location: [facility];  Service: Orthopedics;  Laterality: Left;   CARPAL TUNNEL RELEASE Right    COLONOSCOPY     HERNIA REPAIR  07/03/2011   abd hernia   NOSE SURGERY  2008   sinus   TOTAL KNEE ARTHROPLASTY Left 02/06/2017   Procedure: LEFT TOTAL KNEE ARTHROPLASTY;  Surgeon: Lonni CINDERELLA Poli, MD;  Location[facility] OR;  Service: Orthopedics;  Laterality: Left;   TOTAL KNEE ARTHROPLASTY Right 08/21/2017   TOTAL KNEE ARTHROPLASTY Right 08/21/2017   Procedure: RIGHT TOTAL KNEE ARTHROPLASTY;  Surgeon: Poli Lonni CINDERELLA, MD;  Location: [facility] OR;  Service: Orthopedics;  Laterality: Right;   TUBAL LIGATION      Family History  Problem Relation Age of Onset   Hypertension Mother    Kidney disease Mother    Diabetes Father    Hypertension Father    Diabetes Sister    Hypertension Sister    Hypertension Sister    Diabetes Brother    Hypertension Brother    Heart disease Brother 74       heart attack   Colon cancer Neg Hx    Esophageal cancer Neg Hx    Colon polyps Neg Hx    Pancreatic cancer Neg Hx    Rectal cancer Neg Hx    Stomach cancer Neg Hx     Prior to Admission medications   Medication Sig Start Date End Date Taking? Authorizing Provider  allopurinol  (ZYLOPRIM ) 100 MG tablet TAKE 1 TABLET BY MOUTH DAILY 08/14/23  Yes Tysinger, Alm RAMAN, PA-C  apixaban  (ELIQUIS ) 5 MG TABS tablet Take 1 tablet (5 mg total) by mouth 2 (two) times daily. Start after you finish your starter pack Eliquis  Patient taking differently: Take 5 mg by mouth 2 (two) times daily. 10/11/23  Yes Gonfa, Taye T, MD  Cyanocobalamin  (VITAMIN B-12 PO) Take 1 tablet by mouth daily at 2 PM.   Yes [provider]  fenofibrate  (TRICOR ) 145 MG tablet TAKE 1 TABLET(145 MG) BY MOUTH DAILY 12/14/23  Yes Tysinger, Alm RAMAN, PA-C  losartan -hydrochlorothiazide  (HYZAAR) 100-12.5 MG tablet TAKE 1 TABLET BY MOUTH DAILY  12/17/23  Yes Tysinger, Alm RAMAN, PA-C  MAGNESIUM  CITRATE PO Take 1 tablet by mouth daily as needed.   Yes [provider]  metFORMIN  (GLUCOPHAGE ) 1000 MG tablet Take 1 tablet (1,000  mg total) by mouth 2 (two) times daily with a meal. 09/17/23  Yes Tysinger, Alm RAMAN, PA-C  methocarbamol  (ROBAXIN ) 500 MG tablet Take 1 tablet (500 mg total) by mouth every 8 (eight) hours as needed for muscle spasms. 09/25/23  Yes Tysinger, Alm RAMAN, PA-C  Multiple Vitamin (MULTIVITAMIN) tablet Take 1 tablet by mouth daily.   Yes [provider]  polyethylene glycol powder (GLYCOLAX /MIRALAX ) 17 GM/SCOOP powder Take 17 g by mouth daily. Patient taking differently: Take 17 g by mouth daily as needed for moderate constipation. 09/17/23  Yes Tysinger, Alm RAMAN, PA-C  tirzepatide  (MOUNJARO ) 7.5 MG/0.5ML Pen Inject 7.5 mg into the skin once a week. Patient taking differently: Inject 7.5 mg into the skin once a week. On Thursdays 09/17/23  Yes Tysinger, Alm RAMAN, PA-C  Accu-Chek Softclix Lancets lancets TEST 1 TO 2 TIMES DAILY 09/20/20   Henson, Vickie L, NP-C  Blood Glucose Monitoring Suppl (SOLUS V2 BLOOD GLUCOSE SYSTEM) DEVI Test 1-2 times a day Dx E11.9 08/10/20   Henson, Vickie L, NP-C  glucose blood (ACCU-CHEK GUIDE) test strip TEST 1 TO 2 TIMES DAILY 12/13/21   Joyce Norleen BROCKS, MD    Current Facility-Administered Medications  Medication Dose Route Frequency Provider Last Rate Last Admin   acetaminophen  (TYLENOL ) tablet 650 mg  650 mg Oral Q6H PRN Patel, Ekta V, MD       Or   acetaminophen  (TYLENOL ) suppository 650 mg  650 mg Rectal Q6H PRN Patel, Ekta V, MD       hydrALAZINE  (APRESOLINE ) injection 5 mg  5 mg Intravenous Q4H PRN Patel, Ekta V, MD       insulin  aspart (novoLOG ) injection 0-9 Units  0-9 Units Subcutaneous Q4H Tobie Mario GAILS, MD   2 Units at 01/25/24 1213   lactated ringers  infusion   Intravenous Continuous Tobie Mario GAILS, MD 50 mL/hr at 01/25/24 1213 New Bag at 01/25/24 1213   morphine  (PF) 2  MG/ML injection 2 mg  2 mg Intravenous Q2H PRN Tobie Mario GAILS, MD       ondansetron  (ZOFRAN ) tablet 4 mg  4 mg Oral Q6H PRN Tobie Mario GAILS, MD       Or   ondansetron  (ZOFRAN ) injection 4 mg  4 mg Intravenous Q6H PRN Patel, Ekta V, MD       Oral care mouth rinse  15 mL Mouth Rinse PRN Tobie Mario GAILS, MD        Allergies as of 01/24/2024 - Review Complete 01/24/2024  Allergen Reaction Noted   Other Other (See Comments) 02/06/2017   Crestor [rosuvastatin] Other (See Comments) 08/28/2022   Sulfa antibiotics Rash 11/04/2012    Social History   Socioeconomic History   Marital status: Single    Spouse name: Not on file   Number of children: 2   Years of education: Not on file   Highest education level: Not on file  Occupational History   Occupation: UNEMPLOYED Theatre Stage Manager: UNEMPLOYED  Tobacco Use   Smoking status: Never   Smokeless tobacco: Never  Vaping Use   Vaping status: Never Used  Substance and Sexual Activity   Alcohol use: Yes    Comment: occ wine   Drug use: No   Sexual activity: Never  Other Topics Concern   Not on file  Social History Narrative   Admitted to [facility]/24/17   Divorced   Jehovah Witness    Never smoked   Alcohol none   Full  Code   Right handed    Some caffeine in take    Social Drivers of Health   Financial Resource Strain: Low Risk  (02/27/2023)   Overall Financial Resource Strain (CARDIA)    Difficulty of Paying Living Expenses: Not hard at all  Food Insecurity: No Food Insecurity (01/24/2024)   Hunger Vital Sign    Worried About Running Out of Food in the Last Year: Never true    Ran Out of Food in the Last Year: Never true  Transportation Needs: No Transportation Needs (01/24/2024)   PRAPARE - Administrator, Civil Service (Medical): No    Lack of Transportation (Non-Medical): No  Physical Activity: Inactive (02/27/2023)   Exercise Vital Sign    Days of Exercise per Week: 0 days    Minutes of Exercise  per Session: 0 min  Stress: No Stress Concern Present (02/27/2023)   [employer] of Occupational Health - Occupational Stress Questionnaire    Feeling of Stress : Not at all  Social Connections: Moderately Integrated (01/24/2024)   Social Connection and Isolation Panel [NHANES]    Frequency of Communication with Friends and Family: More than three times a week    Frequency of Social Gatherings with Friends and Family: More than three times a week    Attends Religious Services: More than 4 times per year    Active Member of [employer] or Organizations: Yes    Attends Engineer, Structural: More than 4 times per year    Marital Status: Divorced  Intimate Partner Violence: Not At Risk (01/24/2024)   Humiliation, Afraid, Rape, and Kick questionnaire    Fear of Current or Ex-Partner: No    Emotionally Abused: No    Physically Abused: No    Sexually Abused: No     Code Status   Code Status: Full Code  Review of Systems: All systems reviewed and negative except where noted in HPI.  Physical Exam: Vital signs in last 24 hours: Temp:  [98.2 F (36.8 C)-99 F (37.2 C)] 98.5 F (36.9 C) (02/07 1137) Pulse Rate:  [89-105] 104 (02/07 1137) Resp:  [13-20] 17 (02/07 1137) BP: (96-137)/(65-88) 125/68 (02/07 1137) SpO2:  [95 %-100 %] 100 % (02/07 1137) Weight:  [139.3 kg] 139.3 kg (02/06 1438) Last BM Date : 01/25/24  General:  Pleasant female in NAD Psych:  Cooperative. Normal mood and affect Eyes: Pupils equal Ears:  Normal auditory acuity Nose: No deformity, discharge or lesions Neck:  Supple, no masses felt Lungs:  Clear to auscultation.  Heart:  Regular rate, regular rhythm.  Abdomen:  Soft, nondistended, nontender, active bowel sounds, no masses felt Rectal :  scant amount of medium red colored blood in vault Msk: Symmetrical without gross deformities.  Neurologic:  Alert, oriented, grossly normal neurologically Extremities : No edema Skin:  Intact without significant  lesions.    Intake/Output from previous day: No intake/output data recorded. Intake/Output this shift:  Total I/O In: 565 [I.V.:565] Out: -    Vina Dasen, NP-C   01/25/2024, 1:04 PM

## 2024-01-25 NOTE — Hospital Course (Signed)
 [name] is a 73 y.o. female with past medical history  of PE on eliquis  , morbid obesity, diabetes mellitus type 2, essential hypertension, hyperlipidemia, GERD, presented hospital with loose stools subsequently noted to have bright red blood followed by melanotic stools.  No abdominal pain.  History of colonoscopy 2015.  In the ED, vitals were stable.  CBC showed hemoglobin of 10.8 with INR of 1.1.  BMP with glucose of 222 with creatinine elevation at 1.3. Patient received Ringer lactate, Tylenol , Zofran  and was admitted hospital for GI bleed.  GI bleed with melena. GI has been consulted.  Currently NPO.  IV PPI.  Unable to receive blood transfusion due to religious reasons.  Previous colonoscopy 2015 with history of diverticulosis and internal hemorrhoids.  Last dose of Eliquis  morning of 01/24/2024.  General exam in the ED with no hemorrhoids but grossly positive with dark stools.  Latest hemoglobin of 9.3 from initial 10.8.  Acute blood loss anemia.  Secondary to being on Eliquis  and GI bleed.  Will continue to monitor support.  Hold off with Eliquis .  Patient is Jehovah's Witness.  Anemia panel with vitamin B12 at 12.6.  Folic acid 27.2.  Iron  of 42 and ferritin very low at 10.  Would benefit from iron  transfusion.  History of pulmonary embolism.  Hold off with Eliquis  due to GI bleed.  Hypertension.  Will closely monitor blood pressure.  Resume outpatient medication..  Hold antihypertensives for now.  As needed hydralazine .  Diabetes mellitus type 2.  Continue sliding scale insulin  Accu-Cheks.  Hemoglobin A1c of  Subclinical hypothyroidism.

## 2024-01-26 DIAGNOSIS — K921 Melena: Secondary | ICD-10-CM | POA: Diagnosis not present

## 2024-01-26 DIAGNOSIS — D62 Acute posthemorrhagic anemia: Secondary | ICD-10-CM | POA: Diagnosis not present

## 2024-01-26 LAB — BASIC METABOLIC PANEL
Anion gap: 9 (ref 5–15)
BUN: 15 mg/dL (ref 8–23)
CO2: 23 mmol/L (ref 22–32)
Calcium: 8.9 mg/dL (ref 8.9–10.3)
Chloride: 107 mmol/L (ref 98–111)
Creatinine, Ser: 1.15 mg/dL — ABNORMAL HIGH (ref 0.44–1.00)
GFR, Estimated: 51 mL/min — ABNORMAL LOW (ref >60)
Glucose, Bld: 167 mg/dL — ABNORMAL HIGH (ref 70–99)
Potassium: 3.9 mmol/L (ref 3.5–5.1)
Sodium: 139 mmol/L (ref 135–145)

## 2024-01-26 LAB — HEMOGLOBIN AND HEMATOCRIT, BLOOD
HCT: 27.2 % — ABNORMAL LOW (ref 36.0–46.0)
HCT: 30.1 % — ABNORMAL LOW (ref 36.0–46.0)
Hemoglobin: 8.4 g/dL — ABNORMAL LOW (ref 12.0–15.0)
Hemoglobin: 9.2 g/dL — ABNORMAL LOW (ref 12.0–15.0)

## 2024-01-26 LAB — GLUCOSE, CAPILLARY
Glucose-Capillary: 155 mg/dL — ABNORMAL HIGH (ref 70–99)
Glucose-Capillary: 168 mg/dL — ABNORMAL HIGH (ref 70–99)
Glucose-Capillary: 172 mg/dL — ABNORMAL HIGH (ref 70–99)
Glucose-Capillary: 172 mg/dL — ABNORMAL HIGH (ref 70–99)
Glucose-Capillary: 181 mg/dL — ABNORMAL HIGH (ref 70–99)
Glucose-Capillary: 186 mg/dL — ABNORMAL HIGH (ref 70–99)

## 2024-01-26 LAB — HEMOGLOBIN A1C
Hgb A1c MFr Bld: 8.1 % — ABNORMAL HIGH (ref 4.8–5.6)
Mean Plasma Glucose: 185.77 mg/dL

## 2024-01-26 MED ORDER — PEG-KCL-NACL-NASULF-NA ASC-C 100 G PO SOLR
0.5000 | Freq: Once | ORAL | Status: AC
  Administered 2024-01-26: 100 g via ORAL
  Filled 2024-01-26: qty 1

## 2024-01-26 MED ORDER — PEG-KCL-NACL-NASULF-NA ASC-C 100 G PO SOLR: 1.0000 | Freq: Once | ORAL | Status: DC

## 2024-01-26 NOTE — Progress Notes (Signed)
 Telemetry called RN to report pt having 14 beats run of Vtach. Pt asymptomatic and in bed. MD notified, no new orders received. P. Amo Yvone Slape RN

## 2024-01-26 NOTE — Progress Notes (Signed)
 Daily Progress Note  DOA: 01/24/2024 Hospital Day: 3   Chief Complaint: GI bleed  ASSESSMENT    Brief Narrative:  [name] is a 73 y.o. year old female with a history of  PE on eliquis  ,  morbid obesity, DM2, HTN, HLD, CKD 3, GERD, diverticulosis. Admitted with painless hematochezia.   Hemodynamically stable GI bleed with painless hematochezia on Eliquis .  Probably a diverticular hemorrhage. Had another episode of hematochezia around 7 am ( I saw photo on phone). Hgb fluctuating but down slightly overnight 9.1 >> 8.4   Acute on chronic anemia 2/2 to GI bleed.  Presenting hgb 10.8 (Baseline 10-11)   History of PE, on Eliquis  Last dose of Eliquis  was 01/24/24 am   Obesity / DM.  On mounjaro    Jehovah's Witness No blood transfusions   Principal Problem:   Melena Active Problems:   Hypertension associated with diabetes (HCC)   Type 2 diabetes mellitus with obesity (HCC)   Subclinical hypothyroidism   ABLA (acute blood loss anemia)   Pulmonary embolism (HCC)   Rectal bleeding   PLAN   --Right now she isn't actively bleeding so CTA probably won't be helpful The bleeding is so intermittent so maybe colonoscopy is next step. Keep on clear liquids right now. Will discuss with Dr. Suzann   Subjective   Hungry but otherwise feels okay. She had another episode of hematochezia around 7 pm. No BMs / bleeding so far this am.   Objective   Recent Labs    01/24/24 1751 01/25/24 0124 01/25/24 1118 01/25/24 1630 01/26/24 0743  WBC 11.1*  --   --   --   --   HGB 10.8*   < > 8.9* 9.1* 8.4*  HCT 36.7   < > 29.0* 29.9* 27.2*  PLT 338  --   --   --   --    < > = values in this interval not displayed.   BMET Recent Labs    01/24/24 1751 01/26/24 0743  NA 142 139  K 4.2 3.9  CL 106 107  CO2 22 23  GLUCOSE 222* 167*  BUN 22 15  CREATININE 1.32* 1.15*  CALCIUM  9.6 8.9   LFT Recent Labs    01/24/24 1751  PROT 6.6  ALBUMIN 3.5  AST 17  ALT 14   ALKPHOS 49  BILITOT 0.4   PT/INR Recent Labs    01/24/24 1751  LABPROT 14.4  INR 1.1     Imaging:  MM 3D SCREENING MAMMOGRAM BILATERAL BREAST CLINICAL DATA:  Screening.  EXAM: DIGITAL SCREENING BILATERAL MAMMOGRAM WITH TOMOSYNTHESIS AND CAD  TECHNIQUE: Bilateral screening digital craniocaudal and mediolateral oblique mammograms were obtained. Bilateral screening digital breast tomosynthesis was performed. The images were evaluated with computer-aided detection.  COMPARISON:  Previous exam(s).  ACR Breast Density Category a: The breasts are almost entirely fatty.  FINDINGS: There are no findings suspicious for malignancy.  IMPRESSION: No mammographic evidence of malignancy. A result letter of this screening mammogram will be mailed directly to the patient.  RECOMMENDATION: Screening mammogram in one year. (Code:[id number]-B-01Y)  BI-RADS CATEGORY  1: Negative.  Electronically Signed   By: Rosaline Collet M.D.   On: 12/24/2023 11:38     Scheduled inpatient medications:   insulin  aspart  0-9 Units Subcutaneous Q4H   Continuous inpatient infusions:  PRN inpatient medications: acetaminophen  **OR** acetaminophen , hydrALAZINE , morphine  injection, ondansetron  **OR** ondansetron  (ZOFRAN ) IV, mouth rinse  Vital signs in last 24 hours: Temp:  [97.8  F (36.6 C)-98.5 F (36.9 C)] 97.8 F (36.6 C) (02/08 0800) Pulse Rate:  [86-104] 93 (02/08 0800) Resp:  [10-20] 20 (02/08 0800) BP: (101-143)/(52-93) 143/80 (02/08 0800) SpO2:  [92 %-100 %] 92 % (02/08 0800) Last BM Date : 01/25/24  Intake/Output Summary (Last 24 hours) at 01/26/2024 0928 Last data filed at 01/26/2024 0600 Gross per 24 hour  Intake 721.85 ml  Output --  Net 721.85 ml    Intake/Output from previous day: 02/07 0701 - 02/08 0700 In: 1286.9 [P.O.:120; I.V.:1061.9; IV Piggyback:105] Out: -  Intake/Output this shift: No intake/output data recorded.   Physical Exam:  General: Alert female in  NAD Heart:  Regular rate and rhythm.  Pulmonary: Normal respiratory effort Abdomen: Soft, nondistended, nontender. Normal bowel sounds. Neurologic: Alert and oriented Psych: Pleasant. Cooperative. Insight appears normal.     LOS: 2 days   Vina Dasen ,NP 01/26/2024, 9:28 AM

## 2024-01-26 NOTE — Plan of Care (Signed)
   Problem: Education: Goal: Ability to describe self-care measures that may prevent or decrease complications (Diabetes Survival Skills Education) will improve Outcome: Progressing Goal: Individualized Educational Video(s) Outcome: Progressing   Problem: Coping: Goal: Ability to adjust to condition or change in health will improve Outcome: Progressing

## 2024-01-26 NOTE — Progress Notes (Addendum)
 PROGRESS NOTE  [name] FMW:[id number] DOB: 10-Feb-1951 DOA: 01/24/2024 PCP: Bulah Alm RAMAN, PA-C   LOS: 2 days   Brief narrative:  [name] is a 73 y.o. female with past medical history  of PE on eliquis  , morbid obesity, diabetes mellitus type 2, essential hypertension, hyperlipidemia, GERD, presented hospital with loose stools subsequently noted to have bright red blood followed by melanotic stools.  No abdominal pain.  History of colonoscopy 2015.  In the ED, vitals were stable.  CBC showed hemoglobin of 10.8 with INR of 1.1.  BMP with glucose of 222 with creatinine elevation at 1.3. Patient received Ringer lactate, Tylenol , Zofran  and was admitted hospital for GI bleed.  During hospitalization GI was consulted.   Assessment/Plan: Principal Problem:   Melena Active Problems:   ABLA (acute blood loss anemia)   Pulmonary embolism (HCC)   Hypertension associated with diabetes (HCC)   Type 2 diabetes mellitus with obesity (HCC)   Subclinical hypothyroidism   Rectal bleeding  GI bleed with melena. GI on board.  Last bowel movement yesterday in the evening bright red.  GI following.  Likely need colonoscopy.  Currently NPO.  IV PPI.  Unable to receive blood transfusion due to being Jehovah's Witness.  Previous colonoscopy 2015 with history of diverticulosis and internal hemorrhoids.  Last dose of Eliquis  morning of 01/24/2024.    Latest hemoglobin of 9.1 from initial 10.8.  Acute blood loss anemia.   Secondary to being on Eliquis  and GI bleed.  Continued to hold Eliquis .   Anemia panel with vitamin B12 at 12.6.  Folic acid 27.2.  Iron  of 42 and ferritin very low at 10.  Received 1 dose of IV Venofer  yesterday. Hemoglobin and hematocrit every 12hrly.  History of pulmonary embolism.  Hold off with Eliquis  due to GI bleed.  Will need to restart when able.  Patient did have PEs and DVT September 2024.  Essential hypertension.   Hold antihypertensives for now.  Will consider as  needed hydralazine  for now..  Diabetes mellitus type 2.  Continue sliding scale insulin  Accu-Cheks.  Hemoglobin A1c of 8.6 on 08/14/2023.  Class III obesity.Body mass index is 49.55 kg/m.   Would benefit from weight loss as outpatient.  DVT prophylaxis: SCDs Start: 01/24/24 1956   Disposition: Home likely in 1 to 2 days, plan for colonoscopy  Status is: Inpatient  Remains inpatient appropriate because: GI bleed, need for further observation and possible colonoscopy.    Code Status:     Code Status: Full Code  Family Communication: None at bedside.  Spoke with the patient's son on the phone and updated him about the clinical condition of the patient  Consultants: GI  Procedures: None yet  Anti-infectives:  None  Anti-infectives (From admission, onward)    None       Subjective: Today, patient was seen and examined at bedside.  Patient had episode of GI bleed yesterday.  Seen by GI this morning.  Plan for possible colonoscopy.  Denies shortness of breath, dizziness, lightheadedness.  Objective: Vitals:   01/26/24 0800 01/26/24 1129  BP: (!) 143/80 (!) 117/57  Pulse: 93 88  Resp: 20   Temp: 97.8 F (36.6 C) 98.7 F (37.1 C)  SpO2: 92% 96%    Intake/Output Summary (Last 24 hours) at 01/26/2024 1139 Last data filed at 01/26/2024 0600 Gross per 24 hour  Intake 721.85 ml  Output --  Net 721.85 ml   Filed Weights   01/24/24 1438  Weight: ROLLEN)  139.3 kg   Body mass index is 49.55 kg/m.   Physical Exam:  GENERAL: Patient is alert awake and oriented. Not in obvious distress.  Morbidly obese HENT: Pallor noted.  Pupils equally reactive to light. Oral mucosa is moist NECK: is supple, no gross swelling noted. CHEST: Clear to auscultation. No crackles or wheezes.   CVS: S1 and S2 heard, no murmur. Regular rate and rhythm.  ABDOMEN: Soft, non-tender, bowel sounds are present. EXTREMITIES: No edema. CNS: Cranial nerves are intact. No focal motor deficits. SKIN:  warm and dry without rashes.  Data Review: I have personally reviewed the following laboratory data and studies,  CBC: Recent Labs  Lab 01/24/24 1751 01/25/24 0124 01/25/24 1118 01/25/24 1630 01/26/24 0743  WBC 11.1*  --   --   --   --   NEUTROABS 8.5*  --   --   --   --   HGB 10.8* 9.3* 8.9* 9.1* 8.4*  HCT 36.7 30.2* 29.0* 29.9* 27.2*  MCV 100.0  --   --   --   --   PLT 338  --   --   --   --    Basic Metabolic Panel: Recent Labs  Lab 01/24/24 1751 01/26/24 0743  NA 142 139  K 4.2 3.9  CL 106 107  CO2 22 23  GLUCOSE 222* 167*  BUN 22 15  CREATININE 1.32* 1.15*  CALCIUM  9.6 8.9   Liver Function Tests: Recent Labs  Lab 01/24/24 1751  AST 17  ALT 14  ALKPHOS 49  BILITOT 0.4  PROT 6.6  ALBUMIN 3.5   No results for input(s): LIPASE, AMYLASE in the last 168 hours. No results for input(s): AMMONIA in the last 168 hours. Cardiac Enzymes: No results for input(s): CKTOTAL, CKMB, CKMBINDEX, TROPONINI in the last 168 hours. BNP (last 3 results) Recent Labs    09/09/23 1103  BNP 167.4*    ProBNP (last 3 results) No results for input(s): PROBNP in the last 8760 hours.  CBG: Recent Labs  Lab 01/25/24 1922 01/25/24 2311 01/26/24 0407 01/26/24 0754 01/26/24 1126  GLUCAP 203* 119* 181* 155* 172*   Recent Results (from the past 240 hours)  MRSA Next Gen by PCR, Nasal     Status: None   Collection Time: 01/25/24 12:26 AM   Specimen: Nasal Mucosa; Nasal Swab  Result Value Ref Range Status   MRSA by PCR Next Gen NOT DETECTED NOT DETECTED Final    Comment: (NOTE) The GeneXpert MRSA Assay (FDA approved for NASAL specimens only), is one component of a comprehensive MRSA colonization surveillance program. It is not intended to diagnose MRSA infection nor to guide or monitor treatment for MRSA infections. Test performance is not FDA approved in patients less than 58 years old. Performed at [facility], [address] [place],  [place] [address]      Studies: No results found.    Charene Mccallister, MD  [facility] 01/26/2024  If 7PM-7AM, please contact night-coverage

## 2024-01-27 ENCOUNTER — Encounter (HOSPITAL_COMMUNITY)
Admission: EM | Disposition: A | Discharge status: Home / Self Care | Payer: Self-pay | Source: Home / Self Care | Attending: Internal Medicine

## 2024-01-27 ENCOUNTER — Inpatient Hospital Stay (HOSPITAL_COMMUNITY): Payer: 59 | Admitting: Certified Registered Nurse Anesthetist

## 2024-01-27 ENCOUNTER — Encounter (HOSPITAL_COMMUNITY): Payer: Self-pay | Admitting: Internal Medicine

## 2024-01-27 DIAGNOSIS — D122 Benign neoplasm of ascending colon: Secondary | ICD-10-CM

## 2024-01-27 DIAGNOSIS — K921 Melena: Secondary | ICD-10-CM | POA: Diagnosis not present

## 2024-01-27 DIAGNOSIS — K649 Unspecified hemorrhoids: Secondary | ICD-10-CM

## 2024-01-27 DIAGNOSIS — K648 Other hemorrhoids: Secondary | ICD-10-CM | POA: Diagnosis not present

## 2024-01-27 DIAGNOSIS — D175 Benign lipomatous neoplasm of intra-abdominal organs: Secondary | ICD-10-CM

## 2024-01-27 DIAGNOSIS — K5731 Diverticulosis of large intestine without perforation or abscess with bleeding: Secondary | ICD-10-CM | POA: Diagnosis not present

## 2024-01-27 HISTORY — PX: COLONOSCOPY WITH PROPOFOL: SHX5780

## 2024-01-27 LAB — GLUCOSE, CAPILLARY
Glucose-Capillary: 250 mg/dL — ABNORMAL HIGH (ref 70–99)
Glucose-Capillary: 104 mg/dL — ABNORMAL HIGH (ref 70–99)
Glucose-Capillary: 187 mg/dL — ABNORMAL HIGH (ref 70–99)
Glucose-Capillary: 166 mg/dL — ABNORMAL HIGH (ref 70–99)
Glucose-Capillary: 226 mg/dL — ABNORMAL HIGH (ref 70–99)
Glucose-Capillary: 162 mg/dL — ABNORMAL HIGH (ref 70–99)
Glucose-Capillary: 211 mg/dL — ABNORMAL HIGH (ref 70–99)
Glucose-Capillary: 77 mg/dL (ref 70–99)

## 2024-01-27 LAB — HEMOGLOBIN AND HEMATOCRIT, BLOOD
HCT: 27 % — ABNORMAL LOW (ref 36.0–46.0)
HCT: 27.2 % — ABNORMAL LOW (ref 36.0–46.0)
Hemoglobin: 8.3 g/dL — ABNORMAL LOW (ref 12.0–15.0)
Hemoglobin: 8.1 g/dL — ABNORMAL LOW (ref 12.0–15.0)

## 2024-01-27 SURGERY — COLONOSCOPY WITH PROPOFOL
Anesthesia: Monitor Anesthesia Care

## 2024-01-27 MED ORDER — SODIUM CHLORIDE 0.9 % IV SOLN: INTRAVENOUS | Status: DC | PRN

## 2024-01-27 MED ORDER — PROPOFOL 10 MG/ML IV BOLUS
INTRAVENOUS | Status: DC | PRN
  Administered 2024-01-27: 20 mg via INTRAVENOUS

## 2024-01-27 MED ORDER — EPHEDRINE SULFATE (PRESSORS) 50 MG/ML IJ SOLN
INTRAMUSCULAR | Status: DC | PRN
  Administered 2024-01-27: 10 mg via INTRAVENOUS

## 2024-01-27 MED ORDER — PHENYLEPHRINE 80 MCG/ML (10ML) SYRINGE FOR IV PUSH (FOR BLOOD PRESSURE SUPPORT)
PREFILLED_SYRINGE | INTRAVENOUS | Status: DC | PRN
  Administered 2024-01-27 (×2): 160 ug via INTRAVENOUS
  Administered 2024-01-27 (×5): 80 ug via INTRAVENOUS

## 2024-01-27 MED ORDER — PROPOFOL 500 MG/50ML IV EMUL
INTRAVENOUS | Status: DC | PRN
  Administered 2024-01-27: 180 ug/kg/min via INTRAVENOUS

## 2024-01-27 MED ORDER — LIDOCAINE 2% (20 MG/ML) 5 ML SYRINGE
INTRAMUSCULAR | Status: DC | PRN
  Administered 2024-01-27: 40 mg via INTRAVENOUS

## 2024-01-27 SURGICAL SUPPLY — 20 items
ELECT REM PT RETURN 9FT ADLT (ELECTROSURGICAL) IMPLANT
ELECTRODE REM PT RTRN 9FT ADLT (ELECTROSURGICAL) IMPLANT
FLOOR PAD 36X40 (MISCELLANEOUS) ×1 IMPLANT
FORCEPS BIOP RAD 4 LRG CAP 4 (CUTTING FORCEPS) IMPLANT
FORCEPS BIOP RJ4 240 W/NDL (CUTTING FORCEPS) IMPLANT
FORCEPS BXJMBJMB 240X2.8X (CUTTING FORCEPS) IMPLANT
INJECTOR/SNARE I SNARE (MISCELLANEOUS) IMPLANT
LUBRICANT JELLY 4.5OZ STERILE (MISCELLANEOUS) IMPLANT
MANIFOLD NEPTUNE II (INSTRUMENTS) IMPLANT
NDL SCLEROTHERAPY 25GX240 (NEEDLE) IMPLANT
NEEDLE SCLEROTHERAPY 25GX240 (NEEDLE) IMPLANT
PAD FLOOR 36X40 (MISCELLANEOUS) ×1 IMPLANT
PROBE APC STR FIRE (PROBE) IMPLANT
PROBE INJECTION GOLD 7FR (MISCELLANEOUS) IMPLANT
SNARE ROTATE MED OVAL 20MM (MISCELLANEOUS) IMPLANT
SYR 50ML LL SCALE MARK (SYRINGE) IMPLANT
TRAP SPECIMEN MUCOUS 40CC (MISCELLANEOUS) IMPLANT
TUBING ENDO SMARTCAP PENTAX (MISCELLANEOUS) IMPLANT
TUBING IRRIGATION ENDOGATOR (MISCELLANEOUS) ×1 IMPLANT
WATER STERILE IRR 1000ML POUR (IV SOLUTION) IMPLANT

## 2024-01-27 NOTE — Anesthesia Preprocedure Evaluation (Signed)
 Anesthesia Evaluation  Patient identified by MRN, date of birth, ID band Patient awake    Reviewed: Allergy  & Precautions, NPO status , Patient's Chart, lab work & pertinent test results, reviewed documented beta blocker date and time   History of Anesthesia Complications Negative for: history of anesthetic complications  Airway Mallampati: III  TM Distance: >3 FB   Mouth opening: Limited Mouth Opening  Dental no notable dental hx.    Pulmonary neg COPD, neg PE   breath sounds clear to auscultation       Cardiovascular hypertension, (-) angina + DOE  (-) CAD and (-) Past MI  Rhythm:Regular Rate:Normal     Neuro/Psych neg Seizures  Neuromuscular disease    GI/Hepatic ,GERD  ,,  Endo/Other  diabetesHypothyroidism    Renal/GU Renal disease     Musculoskeletal  (+) Arthritis ,    Abdominal   Peds  Hematology  (+) Blood dyscrasia, anemia   Anesthesia Other Findings   Reproductive/Obstetrics                             Anesthesia Physical Anesthesia Plan  ASA: 3  Anesthesia Plan: MAC   Post-op Pain Management:    Induction:   PONV Risk Score and Plan: 2 and Ondansetron  and Propofol  infusion  Airway Management Planned: Natural Airway and Simple Face Mask  Additional Equipment:   Intra-op Plan:   Post-operative Plan:   Informed Consent: I have reviewed the patients History and Physical, chart, labs and discussed the procedure including the risks, benefits and alternatives for the proposed anesthesia with the patient or authorized representative who has indicated his/her understanding and acceptance.     Dental advisory given  Plan Discussed with: CRNA  Anesthesia Plan Comments:        Anesthesia Quick Evaluation

## 2024-01-27 NOTE — Op Note (Signed)
 [facility] Patient Name: [name] Procedure Date : 01/27/2024 MRN: [id number] Attending MD: Inocente Hausen , MD, [id number] Date of Birth: 1951-08-16 CSN: [id number] Age: 73 Admit Type: Inpatient Procedure:                Colonoscopy Indications:              Last colonoscopy: 2015, Hematochezia, Acute post                            hemorrhagic anemia Providers:                Inocente Hausen, MD, Collene Edu, RN, Farris Southgate,                            Technician Referring MD:              Medicines:                Monitored Anesthesia Care Complications:            No immediate complications. Estimated blood loss:                            None. Estimated Blood Loss:     Estimated blood loss: none. Procedure:                Pre-Anesthesia Assessment:                           - Prior to the procedure, a History and Physical                            was performed, and patient medications and                            allergies were reviewed. The patient's tolerance of                            previous anesthesia was also reviewed. The risks                            and benefits of the procedure and the sedation                            options and risks were discussed with the patient.                            All questions were answered, and informed consent                            was obtained. Prior Anticoagulants: The patient has                            taken Eliquis  (apixaban ), last dose was 3 days                            prior to procedure. ASA Grade  Assessment: III - A                            patient with severe systemic disease. After                            reviewing the risks and benefits, the patient was                            deemed in satisfactory condition to undergo the                            procedure.                           After obtaining informed consent, the colonoscope                            was passed  under direct vision. Throughout the                            procedure, the patient's blood pressure, pulse, and                            oxygen saturations were monitored continuously. The                            CF-HQ190L ([id number]) Olympus coloscope was                            introduced through the anus and advanced to the                            terminal ileum. The colonoscopy was performed                            without difficulty. The patient tolerated the                            procedure but did have an irritable airway during                            exam with excess secretions. The quality of the                            bowel preparation was evaluated using the BBPS                            [facility] Bowel Preparation Scale) with scores of:                            Right Colon = 3, Transverse Colon = 3 and Left  Colon = 3 (entire mucosa seen well with no residual                            staining, small fragments of stool or opaque                            liquid). The total BBPS score equals 9. Scope In: 11:36:43 AM Scope Out: 11:59:00 AM Scope Withdrawal Time: 0 hours 12 minutes 26 seconds  Total Procedure Duration: 0 hours 22 minutes 17 seconds  Findings:      The perianal and digital rectal examinations were normal. Pertinent       negatives include normal sphincter tone and no palpable rectal lesions.      Multiple small-mouthed diverticula were found in the sigmoid colon and       descending colon.      There was a large lipoma, in the transverse colon.      A 5 mm polyp was found in the proximal ascending colon. The polyp was       sessile. The polyp was not removed in the setting of recent GI bleeding.      The terminal ileum could not be deeply intubated but limited views       appeared normal.      There is no evidence of active bleeding or stigmata of recent bleeding       seen in the colon or terminal ileum  during today's exam.      Internal hemorrhoids were found during retroflexion. Impression:               - Diverticulosis in the sigmoid colon and in the                            descending colon. Given spontaneous cessation of                            bleeding and absence of other findings during                            colonoscopy suspect diverticular bleeding was the                            culprit etiology of the patient's presentation.                           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                           - One 5 mm polyp in the proximal ascending colon.                           - The examined portion of the ileum was normal.                           - Internal hemorrhoids.                           - No specimens  collected. Recommendation:           - Return patient to hospital ward for ongoing care.                           - In the absence of active bleeding may resume diet.                           - Continue to monitor serial H&H given 30%                            recurrence risk with diverticular bleeds                           - If there is significant recurrent bleeding can                            revisit possibility of CTA with VIR embolization                           - Proximal ascending colon polyp was not removed                            during today's exam given recent bleeding. Patient                            will need a follow-up colonoscopy in the outpatient                            setting for polyp removal. Recommend this be                            performed in a hospital-based unit given irritable                            airway observed during procedure today. Procedure Code(s):        --- Professional ---                           [phone], Colonoscopy, flexible; diagnostic, including                            collection of specimen(s) by brushing or washing,                            when performed (separate  procedure) Diagnosis Code(s):        --- Professional ---                           K64.8, Other hemorrhoids                           D17.5, Benign lipomatous neoplasm of  intra-abdominal organs                           D12.2, Benign neoplasm of ascending colon                           K92.1, Melena (includes Hematochezia)                           D62, Acute posthemorrhagic anemia                           K57.30, Diverticulosis of large intestine without                            perforation or abscess without bleeding CPT copyright 2022 [employer]. All rights reserved. The codes documented in this report are preliminary and upon coder review may  be revised to meet current compliance requirements. Inocente Hausen, MD 01/27/2024 12:10:02 PM This report has been signed electronically. Number of Addenda: 0

## 2024-01-27 NOTE — Progress Notes (Signed)
 Pt back to the unit from procedure. Pt alert and verbally responsive. Pt OOB with assistance to the BR. Denies any pain. VSS. PSABRA Gong Aureliano Oshields RN   01/27/24 1300  Vitals  Temp 98.3 F (36.8 C)  Temp Source Oral  BP (!) 117/54  MAP (mmHg) 72  BP Location Right Arm  BP Method Automatic  Patient Position (if appropriate) Lying  Pulse Rate 97  Pulse Rate Source Monitor  ECG Heart Rate 98  Resp 16  Level of Consciousness  Level of Consciousness Alert  MEWS COLOR  MEWS Score Color Green  Oxygen Therapy  SpO2 94 %  O2 Device Room Air  Pain Assessment  Pain Scale 0-10  Pain Score 0  MEWS Score  MEWS Temp 0  MEWS Systolic 0  MEWS Pulse 0  MEWS RR 0  MEWS LOC 0  MEWS Score 0

## 2024-01-27 NOTE — Anesthesia Postprocedure Evaluation (Signed)
 Anesthesia Post Note  Patient: [name]  Procedure(s) Performed: COLONOSCOPY WITH PROPOFOL      Patient location during evaluation: PACU Anesthesia Type: MAC Level of consciousness: awake and alert Pain management: pain level controlled Vital Signs Assessment: post-procedure vital signs reviewed and stable Respiratory status: spontaneous breathing, nonlabored ventilation, respiratory function stable and patient connected to nasal cannula oxygen Cardiovascular status: stable and blood pressure returned to baseline Postop Assessment: no apparent nausea or vomiting Anesthetic complications: no   No notable events documented.  Last Vitals:  Vitals:   01/27/24 1205 01/27/24 1215  BP: (!) 106/57 (P) 101/60  Pulse:  93  Resp: 19 19  Temp: 36.4 C   SpO2: 100% 95%    Last Pain:  Vitals:   01/27/24 1205  TempSrc:   PainSc: Asleep                 Lynwood MARLA Cornea

## 2024-01-27 NOTE — Progress Notes (Signed)
 Pt off unit to endo for procedure. P. Amo Mckaylee Dimalanta RN

## 2024-01-27 NOTE — OR Nursing (Signed)
 Contacting [facility] hospital liaison about albumin; requested by pt. Contacted Cortland Ding @[phone] and he spoke with pt. Pt is willing to accept albumin and ENDO team aware. Lucetta Russel [place]

## 2024-01-27 NOTE — Transfer of Care (Signed)
 Immediate Anesthesia Transfer of Care Note  Patient: [name]  Procedure(s) Performed: COLONOSCOPY WITH PROPOFOL   Patient Location: PACU  Anesthesia Type:MAC  Level of Consciousness: drowsy and patient cooperative  Airway & Oxygen Therapy: Patient Spontanous Breathing and Patient connected to face mask oxygen  Post-op Assessment: Report given to RN and Post -op Vital signs reviewed and stable  Post vital signs: Reviewed and stable  Last Vitals:  Vitals Value Taken Time  BP 106/57 01/27/24 1206  Temp    Pulse 105 01/27/24 1207  Resp 19 01/27/24 1207  SpO2 100 % 01/27/24 1207  Vitals shown include unfiled device data.  Last Pain:  Vitals:   01/27/24 1051  TempSrc: Temporal  PainSc: 0-No pain      Patients Stated Pain Goal: 0 (01/25/24 1908)  Complications: No notable events documented.

## 2024-01-27 NOTE — Plan of Care (Signed)
 Problem: Education: Goal: Ability to describe self-care measures that may prevent or decrease complications (Diabetes Survival Skills Education) will improve 01/27/2024 2211 by Marvis Kenneth SAILOR, RN Outcome: Progressing 01/27/2024 2017 by Marvis Kenneth SAILOR, RN Outcome: Progressing Goal: Individualized Educational Video(s) 01/27/2024 2211 by Marvis Kenneth SAILOR, RN Outcome: Progressing 01/27/2024 2017 by Marvis Kenneth SAILOR, RN Outcome: Progressing   Problem: Coping: Goal: Ability to adjust to condition or change in health will improve 01/27/2024 2211 by Marvis Kenneth SAILOR, RN Outcome: Progressing 01/27/2024 2017 by Marvis Kenneth SAILOR, RN Outcome: Progressing   Problem: Fluid Volume: Goal: Ability to maintain a balanced intake and output will improve 01/27/2024 2211 by Marvis Kenneth SAILOR, RN Outcome: Progressing 01/27/2024 2017 by Marvis Kenneth SAILOR, RN Outcome: Progressing   Problem: Health Behavior/Discharge Planning: Goal: Ability to identify and utilize available resources and services will improve 01/27/2024 2211 by Marvis Kenneth SAILOR, RN Outcome: Progressing 01/27/2024 2017 by Marvis Kenneth SAILOR, RN Outcome: Progressing Goal: Ability to manage health-related needs will improve 01/27/2024 2211 by Marvis Kenneth SAILOR, RN Outcome: Progressing 01/27/2024 2017 by Marvis Kenneth SAILOR, RN Outcome: Progressing   Problem: Metabolic: Goal: Ability to maintain appropriate glucose levels will improve 01/27/2024 2211 by Marvis Kenneth SAILOR, RN Outcome: Progressing 01/27/2024 2017 by Marvis Kenneth SAILOR, RN Outcome: Progressing   Problem: Nutritional: Goal: Maintenance of adequate nutrition will improve 01/27/2024 2211 by Marvis Kenneth SAILOR, RN Outcome: Progressing 01/27/2024 2017 by Marvis Kenneth SAILOR, RN Outcome: Progressing Goal: Progress toward achieving an optimal weight will improve 01/27/2024 2211 by Marvis Kenneth SAILOR, RN Outcome: Progressing 01/27/2024 2017 by Marvis Kenneth SAILOR, RN Outcome:  Progressing   Problem: Skin Integrity: Goal: Risk for impaired skin integrity will decrease 01/27/2024 2211 by Marvis Kenneth SAILOR, RN Outcome: Progressing 01/27/2024 2017 by Marvis Kenneth SAILOR, RN Outcome: Progressing   Problem: Tissue Perfusion: Goal: Adequacy of tissue perfusion will improve 01/27/2024 2211 by Marvis Kenneth SAILOR, RN Outcome: Progressing 01/27/2024 2017 by Marvis Kenneth SAILOR, RN Outcome: Progressing   Problem: Education: Goal: Knowledge of General Education information will improve Description: Including pain rating scale, medication(s)/side effects and non-pharmacologic comfort measures 01/27/2024 2211 by Marvis Kenneth SAILOR, RN Outcome: Progressing 01/27/2024 2017 by Marvis Kenneth SAILOR, RN Outcome: Progressing   Problem: Health Behavior/Discharge Planning: Goal: Ability to manage health-related needs will improve 01/27/2024 2211 by Marvis Kenneth SAILOR, RN Outcome: Progressing 01/27/2024 2017 by Marvis Kenneth SAILOR, RN Outcome: Progressing   Problem: Clinical Measurements: Goal: Ability to maintain clinical measurements within normal limits will improve 01/27/2024 2211 by Marvis Kenneth SAILOR, RN Outcome: Progressing 01/27/2024 2017 by Marvis Kenneth SAILOR, RN Outcome: Progressing Goal: Will remain free from infection 01/27/2024 2211 by Marvis Kenneth SAILOR, RN Outcome: Progressing 01/27/2024 2017 by Marvis Kenneth SAILOR, RN Outcome: Progressing Goal: Diagnostic test results will improve 01/27/2024 2211 by Marvis Kenneth SAILOR, RN Outcome: Progressing 01/27/2024 2017 by Marvis Kenneth SAILOR, RN Outcome: Progressing Goal: Respiratory complications will improve 01/27/2024 2211 by Marvis Kenneth SAILOR, RN Outcome: Progressing 01/27/2024 2017 by Marvis Kenneth SAILOR, RN Outcome: Progressing Goal: Cardiovascular complication will be avoided 01/27/2024 2211 by Marvis Kenneth SAILOR, RN Outcome: Progressing 01/27/2024 2017 by Marvis Kenneth SAILOR, RN Outcome: Progressing   Problem: Activity: Goal: Risk  for activity intolerance will decrease 01/27/2024 2211 by Marvis Kenneth SAILOR, RN Outcome: Progressing 01/27/2024 2017 by Marvis Kenneth SAILOR, RN Outcome: Progressing   Problem: Nutrition: Goal: Adequate nutrition will be maintained 01/27/2024 2211 by Marvis Kenneth SAILOR, RN Outcome: Progressing 01/27/2024 2017 by Marvis Kenneth SAILOR, RN Outcome: Progressing  Problem: Coping: Goal: Level of anxiety will decrease 01/27/2024 2211 by Marvis Kenneth SAILOR, RN Outcome: Progressing 01/27/2024 2017 by Marvis Kenneth SAILOR, RN Outcome: Progressing   Problem: Elimination: Goal: Will not experience complications related to bowel motility 01/27/2024 2211 by Marvis Kenneth SAILOR, RN Outcome: Progressing 01/27/2024 2017 by Marvis Kenneth SAILOR, RN Outcome: Progressing Goal: Will not experience complications related to urinary retention 01/27/2024 2211 by Marvis Kenneth SAILOR, RN Outcome: Progressing 01/27/2024 2017 by Marvis Kenneth SAILOR, RN Outcome: Progressing   Problem: Pain Managment: Goal: General experience of comfort will improve and/or be controlled 01/27/2024 2211 by Marvis Kenneth SAILOR, RN Outcome: Progressing 01/27/2024 2017 by Marvis Kenneth SAILOR, RN Outcome: Progressing   Problem: Safety: Goal: Ability to remain free from injury will improve 01/27/2024 2211 by Marvis Kenneth SAILOR, RN Outcome: Progressing 01/27/2024 2017 by Marvis Kenneth SAILOR, RN Outcome: Progressing   Problem: Skin Integrity: Goal: Risk for impaired skin integrity will decrease 01/27/2024 2211 by Marvis Kenneth SAILOR, RN Outcome: Progressing 01/27/2024 2017 by Marvis Kenneth SAILOR, RN Outcome: Progressing   Problem: Education: Goal: Ability to identify signs and symptoms of gastrointestinal bleeding will improve 01/27/2024 2211 by Marvis Kenneth SAILOR, RN Outcome: Progressing 01/27/2024 2017 by Marvis Kenneth SAILOR, RN Outcome: Progressing   Problem: Bowel/Gastric: Goal: Will show no signs and symptoms of gastrointestinal bleeding 01/27/2024 2211 by  Marvis Kenneth SAILOR, RN Outcome: Progressing 01/27/2024 2017 by Marvis Kenneth SAILOR, RN Outcome: Progressing   Problem: Fluid Volume: Goal: Will show no signs and symptoms of excessive bleeding 01/27/2024 2211 by Marvis Kenneth SAILOR, RN Outcome: Progressing 01/27/2024 2017 by Marvis Kenneth SAILOR, RN Outcome: Progressing   Problem: Clinical Measurements: Goal: Complications related to the disease process, condition or treatment will be avoided or minimized 01/27/2024 2211 by Marvis Kenneth SAILOR, RN Outcome: Progressing 01/27/2024 2017 by Marvis Kenneth SAILOR, RN Outcome: Progressing

## 2024-01-27 NOTE — H&P (Signed)
 [facility] History and Physical   Primary Care Physician:  Bulah Alm RAMAN, PA-C   Reason for Procedure:  Hematochezia and posthemorrhagic anemia  Plan:    Colonoscopy     HPI: [name] is a 73 y.o. female undergoing diagnostic colonoscopy for evaluation of hematochezia and posthemorrhagic anemia.  [name] was admitted to the hospital with abrupt onset of bright red and maroon stool per rectum.  Hemoglobin has declined to 8.4 from a baseline of 10-11.  She is Jehovah's Witness and declines blood transfusion.  Reports that bleeding seems to have slowed down overnight.  She is hemodynamically stable.  Last colonoscopy was performed in 2015 and showed diverticuli and a lipoma.  Last dose of Eliquis  01/24/2024.   Past Medical History:  Diagnosis Date   Abdominal hernia    Acute blood loss as cause of postoperative anemia 02/16/2017   Arthritis    knees, back, R ankle    Bilateral carpal tunnel syndrome 04/29/2012   Diabetes mellitus    Diabetes mellitus type 2 in obese 12/31/2015   Edema    legs   GERD (gastroesophageal reflux disease)    HOH (hard of hearing)    Hypertension    Hypertriglyceridemia 08/30/2015   Started on medication 08/30/15   Lipoma 09/30/2012   Obesity    morbid obesity   OBESITY, MORBID 10/16/2007   Qualifier: Diagnosis of  By: Candia Moats     Osteoarthrosis involving lower leg 10/16/2007   Qualifier: Diagnosis of  By: Candia Moats     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   right   CARPAL TUNNEL RELEASE Left 07/10/2014   Procedure: LEFT ENDOSCOPIC CARPAL TUNNEL RELEASE ;  Surgeon: Alm DELENA Hummer, MD;  Location: [facility];  Service: Orthopedics;   Laterality: Left;   CARPAL TUNNEL RELEASE Right    COLONOSCOPY     HERNIA REPAIR  07/03/2011   abd hernia   NOSE SURGERY  2008   sinus   TOTAL KNEE ARTHROPLASTY Left 02/06/2017   Procedure: LEFT TOTAL KNEE ARTHROPLASTY;  Surgeon: Lonni CINDERELLA Poli, MD;  Location[facility];  Service: Orthopedics;  Laterality: Left;   TOTAL KNEE ARTHROPLASTY Right 08/21/2017   TOTAL KNEE ARTHROPLASTY Right 08/21/2017   Procedure: RIGHT TOTAL KNEE ARTHROPLASTY;  Surgeon: Poli Lonni CINDERELLA, MD;  Location: [facility] OR;  Service: Orthopedics;  Laterality: Right;   TUBAL LIGATION      Prior to Admission medications   Medication Sig Start Date End Date Taking? Authorizing Provider  allopurinol  (ZYLOPRIM ) 100 MG tablet TAKE 1 TABLET BY MOUTH DAILY 08/14/23  Yes Tysinger, Alm RAMAN, PA-C  apixaban  (ELIQUIS ) 5 MG TABS tablet Take 1 tablet (5 mg total) by mouth 2 (two) times daily. Start after you finish your starter pack Eliquis  Patient taking differently: Take 5 mg by mouth 2 (two) times daily. 10/11/23  Yes Gonfa, Taye T, MD  Cyanocobalamin  (VITAMIN B-12 PO) Take 1 tablet by mouth daily at 2 PM.   Yes [provider]  fenofibrate  (TRICOR ) 145 MG tablet TAKE 1 TABLET(145 MG) BY MOUTH DAILY 12/14/23  Yes Tysinger, Alm RAMAN, PA-C  losartan -hydrochlorothiazide  (HYZAAR) 100-12.5 MG tablet TAKE 1 TABLET BY MOUTH DAILY 12/17/23  Yes Tysinger, Alm  S, PA-C  MAGNESIUM  CITRATE PO Take 1 tablet by mouth daily as needed.   Yes [provider]  metFORMIN  (GLUCOPHAGE ) 1000 MG tablet Take 1 tablet (1,000 mg total) by mouth 2 (two) times daily with a meal. 09/17/23  Yes Tysinger, Alm RAMAN, PA-C  methocarbamol  (ROBAXIN ) 500 MG tablet Take 1 tablet (500 mg total) by mouth every 8 (eight) hours as needed for muscle spasms. 09/25/23  Yes Tysinger, Alm RAMAN, PA-C  Multiple Vitamin (MULTIVITAMIN) tablet Take 1 tablet by mouth daily.   Yes [provider]  polyethylene glycol powder (GLYCOLAX /MIRALAX ) 17 GM/SCOOP  powder Take 17 g by mouth daily. Patient taking differently: Take 17 g by mouth daily as needed for moderate constipation. 09/17/23  Yes Tysinger, Alm RAMAN, PA-C  tirzepatide  (MOUNJARO ) 7.5 MG/0.5ML Pen Inject 7.5 mg into the skin once a week. Patient taking differently: Inject 7.5 mg into the skin once a week. On Thursdays 09/17/23  Yes Tysinger, Alm RAMAN, PA-C  Accu-Chek Softclix Lancets lancets TEST 1 TO 2 TIMES DAILY 09/20/20   Henson, Vickie L, NP-C  Blood Glucose Monitoring Suppl (SOLUS V2 BLOOD GLUCOSE SYSTEM) DEVI Test 1-2 times a day Dx E11.9 08/10/20   Henson, Vickie L, NP-C  glucose blood (ACCU-CHEK GUIDE) test strip TEST 1 TO 2 TIMES DAILY 12/13/21   Joyce Norleen BROCKS, MD    Current Facility-Administered Medications  Medication Dose Route Frequency Provider Last Rate Last Admin   [MAR Hold] acetaminophen  (TYLENOL ) tablet 650 mg  650 mg Oral Q6H PRN Patel, Ekta V, MD   650 mg at 01/25/24 1907   Or   [MAR Hold] acetaminophen  (TYLENOL ) suppository 650 mg  650 mg Rectal Q6H PRN Patel, Ekta V, MD       [MAR Hold] hydrALAZINE  (APRESOLINE ) injection 5 mg  5 mg Intravenous Q4H PRN Patel, Ekta V, MD       [MAR Hold] insulin  aspart (novoLOG ) injection 0-9 Units  0-9 Units Subcutaneous Q4H Tobie Modest V, MD   3 Units at 01/27/24 0446   [MAR Hold] morphine  (PF) 2 MG/ML injection 2 mg  2 mg Intravenous Q2H PRN Patel, Ekta V, MD       [MAR Hold] ondansetron  (ZOFRAN ) tablet 4 mg  4 mg Oral Q6H PRN Patel, Ekta V, MD       Or   ILDA Hold] ondansetron  (ZOFRAN ) injection 4 mg  4 mg Intravenous Q6H PRN Patel, Ekta V, MD       [MAR Hold] Oral care mouth rinse  15 mL Mouth Rinse PRN Tobie Modest GAILS, MD        Allergies as of 01/24/2024 - Review Complete 01/24/2024  Allergen Reaction Noted   Other Other (See Comments) 02/06/2017   Crestor [rosuvastatin] Other (See Comments) 08/28/2022   Sulfa antibiotics Rash 11/04/2012    Family History  Problem Relation Age of Onset   Hypertension Mother    Kidney  disease Mother    Diabetes Father    Hypertension Father    Diabetes Sister    Hypertension Sister    Hypertension Sister    Diabetes Brother    Hypertension Brother    Heart disease Brother 21       heart attack   Colon cancer Neg Hx    Esophageal cancer Neg Hx    Colon polyps Neg Hx    Pancreatic cancer Neg Hx    Rectal cancer Neg Hx    Stomach cancer Neg Hx     Social History  Socioeconomic History   Marital status: Single    Spouse name: Not on file   Number of children: 2   Years of education: Not on file   Highest education level: Not on file  Occupational History   Occupation: UNEMPLOYED Theatre Stage Manager: UNEMPLOYED  Tobacco Use   Smoking status: Never   Smokeless tobacco: Never  Vaping Use   Vaping status: Never Used  Substance and Sexual Activity   Alcohol use: Yes    Comment: occ wine   Drug use: No   Sexual activity: Never  Other Topics Concern   Not on file  Social History Narrative   Admitted to [facility] 08/24/17   Divorced   Jehovah Witness    Never smoked   Alcohol none   Full Code   Right handed    Some caffeine in take    Social Drivers of Health   Financial Resource Strain: Low Risk  (02/27/2023)   Overall Financial Resource Strain (CARDIA)    Difficulty of Paying Living Expenses: Not hard at all  Food Insecurity: No Food Insecurity (01/24/2024)   Hunger Vital Sign    Worried About Running Out of Food in the Last Year: Never true    Ran Out of Food in the Last Year: Never true  Transportation Needs: No Transportation Needs (01/24/2024)   PRAPARE - Administrator, Civil Service (Medical): No    Lack of Transportation (Non-Medical): No  Physical Activity: Inactive (02/27/2023)   Exercise Vital Sign    Days of Exercise per Week: 0 days    Minutes of Exercise per Session: 0 min  Stress: No Stress Concern Present (02/27/2023)   [employer] of Occupational Health - Occupational Stress Questionnaire     Feeling of Stress : Not at all  Social Connections: Moderately Integrated (01/24/2024)   Social Connection and Isolation Panel [NHANES]    Frequency of Communication with Friends and Family: More than three times a week    Frequency of Social Gatherings with Friends and Family: More than three times a week    Attends Religious Services: More than 4 times per year    Active Member of [employer] or Organizations: Yes    Attends Engineer, Structural: More than 4 times per year    Marital Status: Divorced  Intimate Partner Violence: Not At Risk (01/24/2024)   Humiliation, Afraid, Rape, and Kick questionnaire    Fear of Current or Ex-Partner: No    Emotionally Abused: No    Physically Abused: No    Sexually Abused: No    Review of Systems:  All other review of systems negative except as mentioned in the HPI.  Physical Exam: Vital signs BP (!) 120/55   Pulse 95   Temp (!) 97.4 F (36.3 C) (Temporal)   Resp 15   Ht 5' 6 (1.676 m)   Wt (!) 139.3 kg   LMP  (LMP Unknown)   SpO2 99%   BMI 49.55 kg/m   General:   Alert,  Well-developed, well-nourished, pleasant and cooperative in NAD Lungs:  Clear throughout to auscultation.   Heart:  Regular rate and rhythm; no murmurs, clicks, rubs,  or gallops. Abdomen:  Soft, nontender and nondistended. Normal bowel sounds.   Neuro/Psych:  Normal mood and affect. A and O x 3  Lab Results  Component Value Date   WBC 11.1 (H) 01/24/2024   HGB 8.3 (L) 01/27/2024   HCT 27.0 (L) 01/27/2024  MCV 100.0 01/24/2024   PLT 338 01/24/2024   Lab Results  Component Value Date   NA 139 01/26/2024   K 3.9 01/26/2024   CL 107 01/26/2024   CO2 23 01/26/2024   Lab Results  Component Value Date   ALT 14 01/24/2024   AST 17 01/24/2024   ALKPHOS 49 01/24/2024   BILITOT 0.4 01/24/2024     Inocente Hausen, MD [facility]

## 2024-01-27 NOTE — Plan of Care (Signed)
 Problem: Education: Goal: Ability to describe self-care measures that may prevent or decrease complications (Diabetes Survival Skills Education) will improve 01/27/2024 0216 by Marvis Kenneth SAILOR, RN Outcome: Progressing 01/26/2024 2210 by Marvis Kenneth SAILOR, RN Outcome: Progressing Goal: Individualized Educational Video(s) 01/27/2024 0216 by Marvis Kenneth SAILOR, RN Outcome: Progressing 01/26/2024 2210 by Marvis Kenneth SAILOR, RN Outcome: Progressing   Problem: Coping: Goal: Ability to adjust to condition or change in health will improve 01/27/2024 0216 by Marvis Kenneth SAILOR, RN Outcome: Progressing 01/26/2024 2210 by Marvis Kenneth SAILOR, RN Outcome: Progressing   Problem: Fluid Volume: Goal: Ability to maintain a balanced intake and output will improve 01/27/2024 0216 by Marvis Kenneth SAILOR, RN Outcome: Progressing 01/26/2024 2210 by Marvis Kenneth SAILOR, RN Outcome: Progressing   Problem: Health Behavior/Discharge Planning: Goal: Ability to identify and utilize available resources and services will improve 01/27/2024 0216 by Marvis Kenneth SAILOR, RN Outcome: Progressing 01/26/2024 2210 by Marvis Kenneth SAILOR, RN Outcome: Progressing Goal: Ability to manage health-related needs will improve 01/27/2024 0216 by Marvis Kenneth SAILOR, RN Outcome: Progressing 01/26/2024 2210 by Marvis Kenneth SAILOR, RN Outcome: Progressing   Problem: Metabolic: Goal: Ability to maintain appropriate glucose levels will improve 01/27/2024 0216 by Marvis Kenneth SAILOR, RN Outcome: Progressing 01/26/2024 2210 by Marvis Kenneth SAILOR, RN Outcome: Progressing   Problem: Nutritional: Goal: Maintenance of adequate nutrition will improve 01/27/2024 0216 by Marvis Kenneth SAILOR, RN Outcome: Progressing 01/26/2024 2210 by Marvis Kenneth SAILOR, RN Outcome: Progressing Goal: Progress toward achieving an optimal weight will improve 01/27/2024 0216 by Marvis Kenneth SAILOR, RN Outcome: Progressing 01/26/2024 2210 by Marvis Kenneth SAILOR, RN Outcome:  Progressing   Problem: Skin Integrity: Goal: Risk for impaired skin integrity will decrease 01/27/2024 0216 by Marvis Kenneth SAILOR, RN Outcome: Progressing 01/26/2024 2210 by Marvis Kenneth SAILOR, RN Outcome: Progressing   Problem: Tissue Perfusion: Goal: Adequacy of tissue perfusion will improve 01/27/2024 0216 by Marvis Kenneth SAILOR, RN Outcome: Progressing 01/26/2024 2210 by Marvis Kenneth SAILOR, RN Outcome: Progressing   Problem: Education: Goal: Knowledge of General Education information will improve Description: Including pain rating scale, medication(s)/side effects and non-pharmacologic comfort measures 01/27/2024 0216 by Marvis Kenneth SAILOR, RN Outcome: Progressing 01/26/2024 2210 by Marvis Kenneth SAILOR, RN Outcome: Progressing   Problem: Health Behavior/Discharge Planning: Goal: Ability to manage health-related needs will improve 01/27/2024 0216 by Marvis Kenneth SAILOR, RN Outcome: Progressing 01/26/2024 2210 by Marvis Kenneth SAILOR, RN Outcome: Progressing   Problem: Clinical Measurements: Goal: Ability to maintain clinical measurements within normal limits will improve 01/27/2024 0216 by Marvis Kenneth SAILOR, RN Outcome: Progressing 01/26/2024 2210 by Marvis Kenneth SAILOR, RN Outcome: Progressing Goal: Will remain free from infection 01/27/2024 0216 by Marvis Kenneth SAILOR, RN Outcome: Progressing 01/26/2024 2210 by Marvis Kenneth SAILOR, RN Outcome: Progressing Goal: Diagnostic test results will improve 01/27/2024 0216 by Marvis Kenneth SAILOR, RN Outcome: Progressing 01/26/2024 2210 by Marvis Kenneth SAILOR, RN Outcome: Progressing Goal: Respiratory complications will improve 01/27/2024 0216 by Marvis Kenneth SAILOR, RN Outcome: Progressing 01/26/2024 2210 by Marvis Kenneth SAILOR, RN Outcome: Progressing Goal: Cardiovascular complication will be avoided 01/27/2024 0216 by Marvis Kenneth SAILOR, RN Outcome: Progressing 01/26/2024 2210 by Marvis Kenneth SAILOR, RN Outcome: Progressing   Problem: Activity: Goal: Risk  for activity intolerance will decrease 01/27/2024 0216 by Marvis Kenneth SAILOR, RN Outcome: Progressing 01/26/2024 2210 by Marvis Kenneth SAILOR, RN Outcome: Progressing   Problem: Nutrition: Goal: Adequate nutrition will be maintained 01/27/2024 0216 by Marvis Kenneth SAILOR, RN Outcome: Progressing 01/26/2024 2210 by Marvis Kenneth SAILOR, RN Outcome: Progressing  Problem: Coping: Goal: Level of anxiety will decrease 01/27/2024 0216 by Marvis Kenneth SAILOR, RN Outcome: Progressing 01/26/2024 2210 by Marvis Kenneth SAILOR, RN Outcome: Progressing   Problem: Elimination: Goal: Will not experience complications related to bowel motility 01/27/2024 0216 by Marvis Kenneth SAILOR, RN Outcome: Progressing 01/26/2024 2210 by Marvis Kenneth SAILOR, RN Outcome: Progressing Goal: Will not experience complications related to urinary retention 01/27/2024 0216 by Marvis Kenneth SAILOR, RN Outcome: Progressing 01/26/2024 2210 by Marvis Kenneth SAILOR, RN Outcome: Progressing   Problem: Pain Managment: Goal: General experience of comfort will improve and/or be controlled 01/27/2024 0216 by Marvis Kenneth SAILOR, RN Outcome: Progressing 01/26/2024 2210 by Marvis Kenneth SAILOR, RN Outcome: Progressing   Problem: Safety: Goal: Ability to remain free from injury will improve 01/27/2024 0216 by Marvis Kenneth SAILOR, RN Outcome: Progressing 01/26/2024 2210 by Marvis Kenneth SAILOR, RN Outcome: Progressing   Problem: Skin Integrity: Goal: Risk for impaired skin integrity will decrease 01/27/2024 0216 by Marvis Kenneth SAILOR, RN Outcome: Progressing 01/26/2024 2210 by Marvis Kenneth SAILOR, RN Outcome: Progressing   Problem: Education: Goal: Ability to identify signs and symptoms of gastrointestinal bleeding will improve 01/27/2024 0216 by Marvis Kenneth SAILOR, RN Outcome: Progressing 01/26/2024 2210 by Marvis Kenneth SAILOR, RN Outcome: Progressing   Problem: Bowel/Gastric: Goal: Will show no signs and symptoms of gastrointestinal bleeding 01/27/2024 0216 by  Marvis Kenneth SAILOR, RN Outcome: Progressing 01/26/2024 2210 by Marvis Kenneth SAILOR, RN Outcome: Progressing   Problem: Fluid Volume: Goal: Will show no signs and symptoms of excessive bleeding 01/27/2024 0216 by Marvis Kenneth SAILOR, RN Outcome: Progressing 01/26/2024 2210 by Marvis Kenneth SAILOR, RN Outcome: Progressing   Problem: Clinical Measurements: Goal: Complications related to the disease process, condition or treatment will be avoided or minimized 01/27/2024 0216 by Marvis Kenneth SAILOR, RN Outcome: Progressing 01/26/2024 2210 by Marvis Kenneth SAILOR, RN Outcome: Progressing

## 2024-01-27 NOTE — Progress Notes (Signed)
 PROGRESS NOTE  [name] FMW:[id number] DOB: 1951-02-08 DOA: 01/24/2024 PCP: Kaylee Alm RAMAN, PA-C   LOS: 3 days   Brief narrative:  [name] is a 73 y.o. female with past medical history  of PE on eliquis  , morbid obesity, diabetes mellitus type 2, essential hypertension, hyperlipidemia, GERD, presented hospital with loose stools subsequently noted to have bright red blood followed by melanotic stools.  No abdominal pain.  History of colonoscopy 2015.  In the ED, vitals were stable.  CBC showed hemoglobin of 10.8 with INR of 1.1.  BMP with glucose of 222 with creatinine elevation at 1.3. Patient received Ringer lactate, Tylenol , Zofran  and was admitted hospital for GI bleed.  During hospitalization GI was consulted and has plans for colonoscopy   Assessment/Plan: Principal Problem:   Hematochezia Active Problems:   ABLA (acute blood loss anemia)   Pulmonary embolism (HCC)   Hypertension associated with diabetes (HCC)   Type 2 diabetes mellitus with obesity (HCC)   Subclinical hypothyroidism   Rectal bleeding  GI bleed with melena. GI on board.  Plan for colonoscopy today.  Continue IV PPI.[name]  Unable to receive blood transfusion due to being Jehovah's Witness.  Previous colonoscopy 2015 with history of diverticulosis and internal hemorrhoids.  Last dose of Eliquis  morning of 01/24/2024.    Latest hemoglobin of 8.3 from initial 10.8.  Acute blood loss anemia.   Secondary to being on Eliquis  and GI bleed.  Continued to hold Eliquis .   Anemia panel with vitamin B12 at 12.6.  Folic acid 27.2.  Iron  of 42 and ferritin very low at 10.  Received 1 dose of IV Venofer  on 725. Hemoglobin and hematocrit every 12hrly.  History of pulmonary embolism.  Hold off with Eliquis  due to GI bleed.  Will need to restart when able and okay with GI.[name]  Patient did have PEs and DVT September 2024.  Essential hypertension.   Hold antihypertensives for now.  Will consider as needed hydralazine  for  now..  Diabetes mellitus type 2.  Continue sliding scale insulin , Accu-Cheks.  Hemoglobin A1c of 8.6 on 08/14/2023.  Latest POC glucose of 104.  Class III obesity.Body mass index is 49.55 kg/m.   Would benefit from weight loss as outpatient.  DVT prophylaxis: SCDs Start: 01/24/24 1956   Disposition: Home likely in 1 to 2 days, plan for colonoscopy today to 925  Status is: Inpatient  Remains inpatient appropriate because: GI bleed, need for further observation and  colonoscopy.    Code Status:     Code Status: Full Code  Family Communication:   Spoke with the patient's son on the phone on 01/26/2024   Consultants: GI  Procedures: None yet  Anti-infectives:  None  Anti-infectives (From admission, onward)    None       Subjective: Today, patient was seen and examined at bedside.  Patient undergoing colonic perforation for colonoscopy today.  Still having some radius colored stool.  Denies any pain, nausea, vomiting, fever, chills or rigor.    Objective: Vitals:   01/27/24 0711 01/27/24 0838  BP: (!) 94/44 120/64  Pulse: 86 91  Resp: 13 16  Temp: 97.7 F (36.5 C)   SpO2: (!) 86% 93%   No intake or output data in the 24 hours ending 01/27/24 1028  Filed Weights   01/24/24 1438  Weight: (!) 139.3 kg   Body mass index is 49.55 kg/m.   Physical Exam:  GENERAL: Patient is alert awake and oriented. Not in obvious  distress.  Morbidly obese HENT: Pallor, Pupils equally reactive to light. Oral mucosa is moist NECK: is supple, no gross swelling noted. CHEST: Clear to auscultation. No crackles or wheezes.   CVS: S1 and S2 heard, no murmur. Regular rate and rhythm.  ABDOMEN: Soft, non-tender, bowel sounds are present. EXTREMITIES: No edema. CNS: Cranial nerves are intact. No focal motor deficits. SKIN: warm and dry without rashes.  Data Review: I have personally reviewed the following laboratory data and studies,  CBC: Recent Labs  Lab 01/24/24 1751  01/25/24 0124 01/25/24 1118 01/25/24 1630 01/26/24 0743 01/26/24 1808 01/27/24 0419  WBC 11.1*  --   --   --   --   --   --   NEUTROABS 8.5*  --   --   --   --   --   --   HGB 10.8*   < > 8.9* 9.1* 8.4* 9.2* 8.3*  HCT 36.7   < > 29.0* 29.9* 27.2* 30.1* 27.0*  MCV 100.0  --   --   --   --   --   --   PLT 338  --   --   --   --   --   --    < > = values in this interval not displayed.   Basic Metabolic Panel: Recent Labs  Lab 01/24/24 1751 01/26/24 0743  NA 142 139  K 4.2 3.9  CL 106 107  CO2 22 23  GLUCOSE 222* 167*  BUN 22 15  CREATININE 1.32* 1.15*  CALCIUM  9.6 8.9   Liver Function Tests: Recent Labs  Lab 01/24/24 1751  AST 17  ALT 14  ALKPHOS 49  BILITOT 0.4  PROT 6.6  ALBUMIN 3.5   No results for input(s): LIPASE, AMYLASE in the last 168 hours. No results for input(s): AMMONIA in the last 168 hours. Cardiac Enzymes: No results for input(s): CKTOTAL, CKMB, CKMBINDEX, TROPONINI in the last 168 hours. BNP (last 3 results) Recent Labs    09/09/23 1103  BNP 167.4*    ProBNP (last 3 results) No results for input(s): PROBNP in the last 8760 hours.  CBG: Recent Labs  Lab 01/26/24 1641 01/26/24 1932 01/26/24 2352 01/27/24 0416 01/27/24 0715  GLUCAP 186* 168* 172* 250* 104*   Recent Results (from the past 240 hours)  MRSA Next Gen by PCR, Nasal     Status: None   Collection Time: 01/25/24 12:26 AM   Specimen: Nasal Mucosa; Nasal Swab  Result Value Ref Range Status   MRSA by PCR Next Gen NOT DETECTED NOT DETECTED Final    Comment: (NOTE) The GeneXpert MRSA Assay (FDA approved for NASAL specimens only), is one component of a comprehensive MRSA colonization surveillance program. It is not intended to diagnose MRSA infection nor to guide or monitor treatment for MRSA infections. Test performance is not FDA approved in patients less than 76 years old. Performed at [facility], [address], [place], [place] [address]       Studies: No results found.    Trina Asch, MD  [facility] 01/27/2024  If 7PM-7AM, please contact night-coverage

## 2024-01-28 ENCOUNTER — Encounter (HOSPITAL_COMMUNITY): Payer: Self-pay | Admitting: Pediatrics

## 2024-01-28 ENCOUNTER — Telehealth: Payer: Self-pay | Admitting: Internal Medicine

## 2024-01-28 DIAGNOSIS — K921 Melena: Secondary | ICD-10-CM

## 2024-01-28 DIAGNOSIS — K5730 Diverticulosis of large intestine without perforation or abscess without bleeding: Secondary | ICD-10-CM

## 2024-01-28 DIAGNOSIS — D122 Benign neoplasm of ascending colon: Secondary | ICD-10-CM | POA: Diagnosis not present

## 2024-01-28 DIAGNOSIS — K648 Other hemorrhoids: Secondary | ICD-10-CM

## 2024-01-28 DIAGNOSIS — I10 Essential (primary) hypertension: Secondary | ICD-10-CM

## 2024-01-28 DIAGNOSIS — D62 Acute posthemorrhagic anemia: Secondary | ICD-10-CM | POA: Diagnosis not present

## 2024-01-28 LAB — GLUCOSE, CAPILLARY
Glucose-Capillary: 165 mg/dL — ABNORMAL HIGH (ref 70–99)
Glucose-Capillary: 168 mg/dL — ABNORMAL HIGH (ref 70–99)
Glucose-Capillary: 198 mg/dL — ABNORMAL HIGH (ref 70–99)

## 2024-01-28 LAB — HEMOGLOBIN AND HEMATOCRIT, BLOOD
HCT: 21.6 % — ABNORMAL LOW (ref 36.0–46.0)
Hemoglobin: 6.7 g/dL — CL (ref 12.0–15.0)

## 2024-01-28 MED ORDER — INSULIN ASPART 100 UNIT/ML IJ SOLN: 0.0000 [IU] | Freq: Three times a day (TID) | INTRAMUSCULAR | Status: DC

## 2024-01-28 MED ORDER — FERROUS GLUCONATE 240 (27 FE) MG PO TABS: 240.0000 mg | ORAL_TABLET | Freq: Two times a day (BID) | ORAL | 2 refills | Status: DC

## 2024-01-28 MED ORDER — DARBEPOETIN ALFA 150 MCG/0.3ML IJ SOSY
150.0000 ug | PREFILLED_SYRINGE | Freq: Once | INTRAMUSCULAR | Status: AC
  Administered 2024-01-28: 150 ug via SUBCUTANEOUS
  Filled 2024-01-28: qty 0.3

## 2024-01-28 MED ORDER — INSULIN ASPART 100 UNIT/ML IJ SOLN: 0.0000 [IU] | Freq: Every day | INTRAMUSCULAR | Status: DC

## 2024-01-28 MED ORDER — EPOETIN ALFA NICU SYRINGE 2000 UNITS/ML: 400.0000 [IU]/kg | Freq: Once | INTRAMUSCULAR | Status: DC

## 2024-01-28 MED ORDER — DOCUSATE SODIUM 100 MG PO CAPS: 100.0000 mg | ORAL_CAPSULE | Freq: Every day | ORAL | 2 refills | Status: DC

## 2024-01-28 MED ORDER — IRON SUCROSE 300 MG IVPB - SIMPLE MED
300.0000 mg | Freq: Once | Status: AC
  Administered 2024-01-28: 300 mg via INTRAVENOUS
  Filled 2024-01-28: qty 300

## 2024-01-28 NOTE — Discharge Summary (Addendum)
 Physician Discharge Summary  [name] ZOX:[id number] DOB: 1951-09-11 DOA: 01/24/2024  PCP: Kaylee Crystal, PA-C  Admit date: 01/24/2024 Discharge date: 01/28/2024  Admitted From: Home  Discharge disposition: Home   Recommendations for Outpatient Follow-Up:   Follow up with your primary care provider in one week.  Check CBC, BMP, magnesium  in the next visit Follow-up with hematology Dr. Salomon Cree in 3 to 4 weeks or when scheduled by the clinic. Patient would benefit from ongoing regimen to improve her hemoglobin as outpatient since she is unable to take blood transfusion   Discharge Diagnosis:   Principal Problem:   Hematochezia Active Problems:   ABLA (acute blood loss anemia)   Pulmonary embolism (HCC)   Hypertension associated with diabetes (HCC)   Type 2 diabetes mellitus with obesity (HCC)   Subclinical hypothyroidism   Rectal bleeding   Diverticulosis of colon with hemorrhage   Lipoma of colon   Hemorrhoids   Discharge Condition: Improved.  Diet recommendation: Low sodium, heart healthy.  Carbohydrate-modified.    Wound care: None.  Code status: Full.   History of Present Illness:   [name] is a 73 y.o. female with past medical history  of PE on eliquis  , morbid obesity, diabetes mellitus type 2, essential hypertension, hyperlipidemia, GERD, presented hospital with loose stools subsequently noted to have bright red blood followed by melanotic stools.  No abdominal pain.  History of colonoscopy 2015.  In the ED, vitals were stable.  CBC showed hemoglobin of 10.8 with INR of 1.1.  BMP with glucose of 222 with creatinine elevation at 1.3. Patient received Ringer lactate, Tylenol , Zofran  and was admitted hospital for GI bleed.  During hospitalization GI was consulted and was admitted to hospital for further evaluation and treatment.  Hospital Course:   Following conditions were addressed during hospitalization as listed below,  GI bleed with melena. GI  followed the patient during hospitalization and underwent colonoscopy on 01/27/2024.  Noted diverticulosis without any active bleed.  At this time patient had few bowel movements which were nonbloody.  Discussed with GI Dr. Bridgett Camps and at this time will not continue with anticoagulation due to risk of ongoing bleed, unable to receive blood transfusion due to being Jehovah's Witness and patient has already received more than 5 months of anticoagulation for her history of PE.[name]  Previous colonoscopy 2015 with history of diverticulosis and internal hemorrhoids.  Last dose of Eliquis  morning of 01/24/2024.       Acute blood loss anemia.   Secondary to being on Eliquis  and GI bleed.  Eliquis  will be discontinued on discharge.  Anemia panel with vitamin B12 at 1206.  Folic acid 27.2.  Iron  of 42 and ferritin very low at 10.  Received 1 dose of IV Venofer  on 01/25/24 and received 1 more dose on 01/28/2024.  Per Dr. Salomon Cree hematology and recommends giving Procrit as well and recommend outpatient follow-up with hematology.   History of pulmonary embolism.  Hold off with Eliquis  due to GI bleed.  Has completed more than 5 months of anticoagulation.  Spoke with the patient regarding the risk versus benefits and she is okay about holding off on further anticoagulation.  Patient did have PEs and DVT September 2024.   Essential hypertension.   Resume Hyzaar from home.   Diabetes mellitus type 2. .  Hemoglobin A1c of 8.6 on 08/14/2023.  Latest POC glucose of 104.  Will resume metformin  from home.   Class III obesity.Body mass index is 49.55 kg/m.  Would benefit from weight loss as outpatient.  Disposition.  At this time, patient is stable for disposition home with outpatient PCP, oncology follow-up.  Medical Consultants:   GI  Procedures:    Colonoscopy 01/27/2024 Subjective:   Today, patient was seen and examined at bedside.  Has had bowel movements x 2 without any blood.  Feels okay.  No shortness of breath  dizziness lightheadedness.  Discharge Exam:   Vitals:   01/28/24 0809 01/28/24 1109  BP: (!) 122/58 (!) 120/51  Pulse: 87 90  Resp: 19 18  Temp: 98.5 F (36.9 C) 97.8 F (36.6 C)  SpO2: 97% 100%   Vitals:   01/27/24 2320 01/28/24 0337 01/28/24 0809 01/28/24 1109  BP: 132/64 111/62 (!) 122/58 (!) 120/51  Pulse: 95  87 90  Resp: 17 20 19 18   Temp: 98.4 F (36.9 C) 98.4 F (36.9 C) 98.5 F (36.9 C) 97.8 F (36.6 C)  TempSrc: Oral Oral Oral Oral  SpO2: 96%  97% 100%  Weight:      Height:       Body mass index is 49.55 kg/m.  General: Alert awake, not in obvious distress, morbidly obese HENT: pupils equally reacting to light, pallor noted. Oral mucosa is moist.  Chest:  Clear breath sounds.  Diminished breath sounds bilaterally. No crackles or wheezes.  CVS: S1 &S2 heard. No murmur.  Regular rate and rhythm. Abdomen: Soft, nontender, nondistended.  Bowel sounds are heard.   Extremities: No cyanosis, clubbing or edema.  Peripheral pulses are palpable. Psych: Alert, awake and oriented, normal mood CNS:  No cranial nerve deficits.  Power equal in all extremities.   Skin: Warm and dry.  No rashes noted.  The results of significant diagnostics from this hospitalization (including imaging, microbiology, ancillary and laboratory) are listed below for reference.     Diagnostic Studies:   No results found.   Labs:   Basic Metabolic Panel: Recent Labs  Lab 01/24/24 1751 01/26/24 0743  NA 142 139  K 4.2 3.9  CL 106 107  CO2 22 23  GLUCOSE 222* 167*  BUN 22 15  CREATININE 1.32* 1.15*  CALCIUM  9.6 8.9   GFR Estimated Creatinine Clearance: 65.6 mL/min (A) (by C-G formula based on SCr of 1.15 mg/dL (H)). Liver Function Tests: Recent Labs  Lab 01/24/24 1751  AST 17  ALT 14  ALKPHOS 49  BILITOT 0.4  PROT 6.6  ALBUMIN 3.5   No results for input(s): "LIPASE", "AMYLASE" in the last 168 hours. No results for input(s): "AMMONIA" in the last 168 hours. Coagulation  profile Recent Labs  Lab 01/24/24 1751  INR 1.1    CBC: Recent Labs  Lab 01/24/24 1751 01/25/24 0124 01/26/24 0743 01/26/24 1808 01/27/24 0419 01/27/24 1753 01/28/24 0451  WBC 11.1*  --   --   --   --   --   --   NEUTROABS 8.5*  --   --   --   --   --   --   HGB 10.8*   < > 8.4* 9.2* 8.3* 8.1* 6.7*  HCT 36.7   < > 27.2* 30.1* 27.0* 27.2* 21.6*  MCV 100.0  --   --   --   --   --   --   PLT 338  --   --   --   --   --   --    < > = values in this interval not displayed.   Cardiac Enzymes: No results for  input(s): "CKTOTAL", "CKMB", "CKMBINDEX", "TROPONINI" in the last 168 hours. BNP: Invalid input(s): "POCBNP" CBG: Recent Labs  Lab 01/27/24 1937 01/27/24 2324 01/28/24 0336 01/28/24 0806 01/28/24 1105  GLUCAP 211* 77 165* 168* 198*   D-Dimer No results for input(s): "DDIMER" in the last 72 hours. Hgb A1c Recent Labs    01/26/24 0743  HGBA1C 8.1*   Lipid Profile No results for input(s): "CHOL", "HDL", "LDLCALC", "TRIG", "CHOLHDL", "LDLDIRECT" in the last 72 hours. Thyroid  function studies No results for input(s): "TSH", "T4TOTAL", "T3FREE", "THYROIDAB" in the last 72 hours.  Invalid input(s): "FREET3" Anemia work up No results for input(s): "VITAMINB12", "FOLATE", "FERRITIN", "TIBC", "IRON ", "RETICCTPCT" in the last 72 hours. Microbiology Recent Results (from the past 240 hours)  MRSA Next Gen by PCR, Nasal     Status: None   Collection Time: 01/25/24 12:26 AM   Specimen: Nasal Mucosa; Nasal Swab  Result Value Ref Range Status   MRSA by PCR Next Gen NOT DETECTED NOT DETECTED Final    Comment: (NOTE) The GeneXpert MRSA Assay (FDA approved for NASAL specimens only), is one component of a comprehensive MRSA colonization surveillance program. It is not intended to diagnose MRSA infection nor to guide or monitor treatment for MRSA infections. Test performance is not FDA approved in patients less than 100 years old. Performed at [facility], [address], [place], [place] [address]      Discharge Instructions:   Discharge Instructions     Call [name] for:  persistant nausea and vomiting   Complete by: As directed    Call [name] for:  severe uncontrolled pain   Complete by: As directed    Diet - low sodium heart healthy   Complete by: As directed    Discharge instructions   Complete by: As directed    Follow-up with primary care provider in 1 week.  Check blood work at that time.  Discuss with primary care doctor about improving your hemoglobin levels.  Follow-up with heme oncology Dr. Salomon Cree in 3 to 4 weeks (call for appointment)  Continue taking iron  supplements at home.  Do not overexert.  Seek medical attention for worsening symptoms.  Keep your bowels are soft and moving.  Do not take a blood thinner.   Increase activity slowly   Complete by: As directed       Allergies as of 01/28/2024       Reactions   Other Other (See Comments)   NO Blood Transfusions, or giving blood   Crestor [rosuvastatin] Other (See Comments)   4 prior statins, crestor, lipitor, livalo , bad cramping   Sulfa Antibiotics Rash        Medication List     STOP taking these medications    apixaban  5 MG Tabs tablet Commonly known as: ELIQUIS        TAKE these medications    Accu-Chek Guide test strip Generic drug: glucose blood TEST 1 TO 2 TIMES DAILY   Accu-Chek Softclix Lancets lancets TEST 1 TO 2 TIMES DAILY   allopurinol  100 MG tablet Commonly known as: ZYLOPRIM  TAKE 1 TABLET BY MOUTH DAILY   docusate sodium  100 MG capsule Commonly known as: Colace Take 1 capsule (100 mg total) by mouth daily. Do not take if diarrhea.   fenofibrate  145 MG tablet Commonly known as: TRICOR  TAKE 1 TABLET(145 MG) BY MOUTH DAILY   ferrous gluconate  240 (27 FE) MG tablet Commonly known as: FERGON Take 1 tablet (240 mg total) by mouth 2 (two)  times daily before a meal.   losartan -hydrochlorothiazide  100-12.5 MG tablet Commonly known as: HYZAAR TAKE  1 TABLET BY MOUTH DAILY   MAGNESIUM  CITRATE PO Take 1 tablet by mouth daily as needed.   metFORMIN  1000 MG tablet Commonly known as: GLUCOPHAGE  Take 1 tablet (1,000 mg total) by mouth 2 (two) times daily with a meal.   methocarbamol  500 MG tablet Commonly known as: ROBAXIN  Take 1 tablet (500 mg total) by mouth every 8 (eight) hours as needed for muscle spasms.   multivitamin tablet Take 1 tablet by mouth daily.   polyethylene glycol powder 17 GM/SCOOP powder Commonly known as: GLYCOLAX /MIRALAX  Take 17 g by mouth daily. What changed:  when to take this reasons to take this   Solus V2 Blood Glucose System Devi Test 1-2 times a day Dx E11.9   tirzepatide  7.5 MG/0.5ML Pen Commonly known as: MOUNJARO  Inject 7.5 mg into the skin once a week. What changed: additional instructions   VITAMIN B-12 PO Take 1 tablet by mouth daily at 2 PM.        Follow-up Information     [name], Kaylee Cowing, PA-C Follow up in 1 week(s).   Specialty: Family Medicine Contact information: [address] [place] [address] [phone]         Kaylee Israel, [name] Follow up in 3 week(s).   Specialties: Hematology, Oncology Contact information: [address] [place] [address] [phone]                  Time coordinating discharge: 39 minutes  Signed:  Kaian Fahs  [facility] 01/28/2024, 11:41 AM

## 2024-01-28 NOTE — Progress Notes (Signed)
 Pt has critical lab value of Hgb of 6.7. MD notified.

## 2024-01-28 NOTE — Progress Notes (Signed)
   01/28/24 1142  TOC Brief Assessment  Insurance and Status Reviewed  Patient has primary care physician Yes  Home environment has been reviewed home  Prior level of function: self  Prior/Current Home Services No current home services  Social Drivers of Health Review SDOH reviewed no interventions necessary  Readmission risk has been reviewed Yes  Transition of care needs no transition of care needs at this time    Pt for transition home later today after IV Iron  infusion. No HH or DME needs noted.

## 2024-01-28 NOTE — Progress Notes (Signed)
 Patient ID: [name], female   DOB: 04-10-1951, 73 y.o.   MRN: [id number]    Progress Note   Subjective   Day # 4 CC; acute GI bleed with hematochezia and patient who is a Jehovah's Witness  CTA on admission no active bleeding  Colonoscopy yesterday-5 mm polyp in the proximal ascending colon, not removed in setting of GI bleeding, multiple small mouth diverticuli in the sigmoid and descending colon, large lipoma in the transverse colon, no active bleeding Hemoglobin 10.8 on admission> 8.1 yesterday> 6.7 today  Patient is sitting up in the chair eating breakfast, says she feels okay no abdominal pain she had 2 bowel movements yesterday after the colonoscopy which she says were brown and normal and no bowel movement since.  Discussion with hospitalist this morning regarding indication to resume Eliquis  and decision has been made to stop Eliquis  as she is almost 6 months out from her PE in September.  Vital signs in last 24 hours: Temp:  [97.6 F (36.4 C)-98.9 F (37.2 C)] 98.5 F (36.9 C) (02/10 0809) Pulse Rate:  [87-100] 87 (02/10 0809) Resp:  [16-20] 19 (02/10 0809) BP: (101-132)/(54-85) 122/58 (02/10 0809) SpO2:  [94 %-100 %] 97 % (02/10 0809) Last BM Date : 01/27/24 General:   Older African-American female female in NAD Heart:  Regular rate and rhythm; no murmurs Lungs: Respirations even and unlabored, lungs CTA bilaterally Abdomen:  Soft, nontender and nondistended. Normal bowel sounds. Extremities:  Without edema. Neurologic:  Alert and oriented,  grossly normal neurologically. Psych:  Cooperative. Normal mood and affect.  Intake/Output from previous day: 02/09 0701 - 02/10 0700 In: 850 [P.O.:600; I.V.:250] Out: -  Intake/Output this shift: No intake/output data recorded.  Lab Results: Recent Labs    01/27/24 0419 01/27/24 1753 01/28/24 0451  HGB 8.3* 8.1* 6.7*  HCT 27.0* 27.2* 21.6*   BMET Recent Labs    01/26/24 0743  NA 139  K 3.9  CL 107  CO2 23   GLUCOSE 167*  BUN 15  CREATININE 1.15*  CALCIUM  8.9   LFT No results for input(s): "PROT", "ALBUMIN", "AST", "ALT", "ALKPHOS", "BILITOT", "BILIDIR", "IBILI" in the last 72 hours. PT/INR No results for input(s): "LABPROT", "INR" in the last 72 hours.       Assessment / Plan:    #72 73 year old African-American female admitted with acute lower GI bleed in setting of Eliquis . CTA on admission negative for active extravasation Colonoscopy yesterday pertinent for multiple diverticuli, a large lipoma in the transverse colon and one 5 mm polyp which was not removed No active bleeding noted at the time of procedure  Patient says she had brown stool yesterday no further bowel movement since  She has had an almost 4 g drop in hemoglobin since admission with hemoglobin 6.7 this morning-hopefully equilibrating from previous blood loss She declines blood transfusion, is receiving an iron  infusion this morning  #2 anemia acute on chronic secondary to above #3 history of pulmonary embolus September 2024 #4.  Diabetes mellitus #5.  History of hypertension  Plan; continue to observe hemoglobin until stable, may be able to be discharged home later today or tomorrow Avoid aspirin and NSAIDs  Plan is to leave her off Eliquis  as she is almost 6 months out from the pulmonary embolism  Will need follow-up hemoglobin as an outpatient within a week We can arrange for outpatient follow-up at our office to discuss repeat colonoscopy at some point in the next year or so to remove the 5  mm polyp.  GI will sign off, available if needed    Principal Problem:   Hematochezia Active Problems:   Hypertension associated with diabetes (HCC)   Type 2 diabetes mellitus with obesity (HCC)   Subclinical hypothyroidism   ABLA (acute blood loss anemia)   Pulmonary embolism (HCC)   Rectal bleeding   Diverticulosis of colon with hemorrhage   Lipoma of colon   Hemorrhoids     LOS: 4 days   Izel Hochberg PA-C 01/28/2024, 10:57 AM

## 2024-01-28 NOTE — Telephone Encounter (Signed)
 Patient was identified as falling into the [place] Measure - Diabetes.   Patient was: Appointment scheduled with primary care provider in the next 30 days.

## 2024-01-28 NOTE — Care Management Important Message (Addendum)
 Important Message  Patient Details  Name: [name] MRN: [id number] Date of Birth: [birth year]   Important Message Given:  Yes - Medicare IM   Patient left prior to IM delivery will mail a copy to the patients home address.   Penney Domanski 01/28/2024, 3:37 PM

## 2024-01-29 ENCOUNTER — Inpatient Hospital Stay: Payer: 59 | Admitting: Medical

## 2024-01-30 ENCOUNTER — Telehealth: Payer: Self-pay | Admitting: *Deleted

## 2024-01-30 NOTE — Telephone Encounter (Signed)
Spoke to patient. She says she does not have follow up with any GI and was last seen by [place]. She has scheduled follow up with Dr Doy Hutching following recent hospitalization at which time she can discuss repeat colonoscopy. Scheduled for 03/31/24.

## 2024-01-30 NOTE — Transitions of Care (Post Inpatient/ED Visit) (Signed)
   01/30/2024  Name: [name] MRN: [id number] DOB: Jul 01, 1951  Today's TOC FU Call Status: Today's TOC FU Call Status:: Unsuccessful Call (1st Attempt) Unsuccessful Call (1st Attempt) Date: 01/30/24  Attempted to reach the patient regarding the most recent Inpatient/ED visit.  Follow Up Plan: Additional outreach attempts will be made to reach the patient to complete the Transitions of Care (Post Inpatient/ED visit) call.   Gean Maidens BSN RN [facility] Care Management Coordinator [email or web address] Direct Dial: [phone]  Fax: [phone] Website: [email or web address]

## 2024-01-30 NOTE — Telephone Encounter (Signed)
===  View-only below this line=== ----- Message ----- From: Ottie Glazier, MD Sent: 01/30/2024   8:35 AM EST To: Richardson Chiquito, RN  Please contact the patient and find out if she has a gastroenterology office where she was undergoing previous procedures or if she would like to follow-up with [facility].  If she would like to follow-up with [place] I would recommend setting up an office visit in the next 2 to 3 months to discuss her procedure.  She became very anemic with her recent GI bleed -last hemoglobin 6.7.  She is Jehovah's Witness and does not accept blood transfusions so we will need to wait time for IV iron and Procrit to get her hemoglobin to a safe level to do another colonoscopy could take some time.  Okay to set her up for an appointment with either me or an APP to discuss the next colonoscopy if she wants to see [place].  Harriett Sine ----- Message -----

## 2024-01-31 ENCOUNTER — Ambulatory Visit: Payer: 59 | Admitting: Medical

## 2024-01-31 VITALS — BP 128/72 | HR 66 | Wt 312.6 lb

## 2024-01-31 DIAGNOSIS — K649 Unspecified hemorrhoids: Secondary | ICD-10-CM | POA: Diagnosis not present

## 2024-01-31 DIAGNOSIS — I10 Essential (primary) hypertension: Secondary | ICD-10-CM | POA: Diagnosis not present

## 2024-01-31 DIAGNOSIS — E1169 Type 2 diabetes mellitus with other specified complication: Secondary | ICD-10-CM | POA: Diagnosis not present

## 2024-01-31 DIAGNOSIS — D649 Anemia, unspecified: Secondary | ICD-10-CM | POA: Diagnosis not present

## 2024-01-31 DIAGNOSIS — E669 Obesity, unspecified: Secondary | ICD-10-CM

## 2024-01-31 DIAGNOSIS — K921 Melena: Secondary | ICD-10-CM | POA: Diagnosis not present

## 2024-01-31 MED ORDER — TIRZEPATIDE 10 MG/0.5ML ~~LOC~~ SOAJ: 10.0000 mg | SUBCUTANEOUS | 0 refills | Status: DC

## 2024-01-31 MED ORDER — TIRZEPATIDE 12.5 MG/0.5ML ~~LOC~~ SOAJ: 12.5000 mg | SUBCUTANEOUS | 0 refills | Status: DC

## 2024-01-31 NOTE — Progress Notes (Signed)
Subjective:  [name] is a 73 y.o. female who presents for Chief Complaint  Patient presents with   Hospitalization Follow-up    Hospital follow-up for rectal bleeding     Here for hospital f/u  Was in hospital recently for blood in stool.    Admit date: 01/24/2024 Discharge date: 01/28/2024   Principal Problem:   Hematochezia Active Problems:   ABLA (acute blood loss anemia)   Pulmonary embolism (HCC)   Hypertension associated with diabetes (HCC)   Type 2 diabetes mellitus with obesity (HCC)   Subclinical hypothyroidism   Rectal bleeding   Diverticulosis of colon with hemorrhage   Lipoma of colon   Hemorrhoids   Per hospital records: [name] is a 73 y.o. female with past medical history  of PE on eliquis , morbid obesity, diabetes mellitus type 2, essential hypertension, hyperlipidemia, GERD, presented hospital with loose stools subsequently noted to have bright red blood followed by melanotic stools.  No abdominal pain.  History of colonoscopy 2015.  In the ED, vitals were stable.  CBC showed hemoglobin of 10.8 with INR of 1.1.  BMP with glucose of 222 with creatinine elevation at 1.3. Patient received Ringer lactate, Tylenol, Zofran and was admitted hospital for GI bleed.  During hospitalization GI was consulted and was admitted to hospital for further evaluation and treatment.   GI bleed with melena. GI followed the patient during hospitalization and underwent colonoscopy on 01/27/2024.  Noted diverticulosis without any active bleed.  At this time patient had few bowel movements which were non bloody.  Discussed with GI Dr. Rhea Belton and at this time will not continue with anticoagulation due to risk of ongoing bleed, unable to receive blood transfusion due to being [employer] and patient has already received more than 5 months of anticoagulation for her history of PE.Marland Kitchen  Previous colonoscopy 2015 with history of diverticulosis and internal hemorrhoids.  Last dose of  Eliquis morning of 01/24/2024.       Acute blood loss anemia.   Secondary to being on Eliquis and GI bleed.  Eliquis will be discontinued on discharge.  Anemia panel with vitamin B12 at 1206.  Folic acid 27.2.  Iron of 42 and ferritin very low at 10.  Received 1 dose of IV Venofer on 01/25/24 and received 1 more dose on 01/28/2024.  Per Dr. Candise Che hematology and recommends giving Procrit as well and recommend outpatient follow-up with hematology.   History of pulmonary embolism.  Hold off with Eliquis due to GI bleed.  Has completed more than 5 months of anticoagulation.  Spoke with the patient regarding the risk versus benefits and she is okay about holding off on further anticoagulation.  Patient did have PEs and DVT September 2024.  Currently: She notes no additional bleeding.  She is taking iron BID and tolerating this.  Denies constipation or abdominal pain.   She has discontinued the Eliquis she was on prior.   She is taking her mounjaro 7.5mg  weekly.  No new c/o  No other aggravating or relieving factors.    No other c/o.  Past Medical History:  Diagnosis Date   Abdominal hernia    Acute blood loss as cause of postoperative anemia 02/16/2017   Arthritis    knees, back, R ankle    Bilateral carpal tunnel syndrome 04/29/2012   Diabetes mellitus    Diabetes mellitus type 2 in obese 12/31/2015   Edema    legs   GERD (gastroesophageal reflux disease)  HOH (hard of hearing)    Hypertension    Hypertriglyceridemia 08/30/2015   Started on medication 08/30/15   Lipoma 09/30/2012   Obesity    morbid obesity   OBESITY, MORBID 10/16/2007   Qualifier: Diagnosis of  By: Abby Potash     Osteoarthrosis involving lower leg 10/16/2007   Qualifier: Diagnosis of  By: Abby Potash     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     fenofibrate (TRICOR) 145 MG tablet TAKE 1 TABLET(145 MG) BY MOUTH DAILY 90 tablet 1   ferrous gluconate (FERGON) 240 (27 FE) MG tablet Take 1 tablet (240 mg total) by mouth 2 (two) times daily before a meal. 60 tablet 2   losartan-hydrochlorothiazide (HYZAAR) 100-12.5 MG tablet TAKE 1 TABLET BY MOUTH DAILY 100 tablet 1   MAGNESIUM CITRATE PO Take 1 tablet by mouth daily as needed.     metFORMIN (GLUCOPHAGE) 1000 MG tablet Take 1 tablet (1,000 mg total) by mouth 2 (two) times daily with a meal. 180 tablet 2   Multiple Vitamin (MULTIVITAMIN) tablet Take 1 tablet by mouth daily.     Accu-Chek Softclix Lancets lancets TEST 1 TO 2 TIMES DAILY 100 each 1   Blood Glucose Monitoring Suppl (SOLUS V2 BLOOD GLUCOSE SYSTEM) DEVI Test 1-2 times a day Dx E11.9 1 each 0   glucose blood (ACCU-CHEK GUIDE) test strip TEST 1 TO 2 TIMES DAILY 200 strip 1   methocarbamol (ROBAXIN) 500 MG tablet Take 1 tablet (500 mg total) by mouth every 8 (eight) hours as needed for muscle spasms. (Patient not taking: Reported on 01/31/2024) 15 tablet 0   No current facility-administered medications on file prior to visit.     The following portions of the patient's history were reviewed and updated as appropriate: allergies, current medications, past family history, past medical history, past social history, past surgical history and problem list.  ROS Otherwise as in subjective above     Objective: BP 128/72   Pulse 66   Wt (!) 312 lb 9.6 oz (141.8 kg)   LMP  (LMP Unknown)   BMI 50.45 kg/m   Wt Readings from Last 3 Encounters:  01/31/24 (!) 312 lb 9.6 oz (141.8 kg)  01/24/24 (!) 307 lb (139.3 kg)  09/25/23 (!) 307 lb 9.6 oz (139.5 kg)   General appearance:  alert, no distress, well developed, well nourished neck: supple, no lymphadenopathy, no thyromegaly, no masses Heart: RRR, normal S1, S2, no murmurs Lungs: CTA bilaterally, no wheezes, rhonchi, or rales Pulses: 2+ radial pulses, 2+ pedal pulses, normal cap refill Ext: no edema    Assessment: Encounter Diagnoses  Name Primary?   Anemia, unspecified type Yes   Type 2 diabetes mellitus with obesity (HCC)    Primary hypertension    Hematochezia    Hemorrhoids, unspecified hemorrhoid type      Plan: I reviewed her recent hospital discharge summary, medications reconciles, I reviewed labs, recommendations  Updated labs today as below  She will plan to follow up with hematology soon  Discussed another possible iron infusion  pending labs.  She had 2 infusions 3 days apart in hospital, Venofer.  There was notation from hematology about procrit but I can't tell she received this in the hospital.  She is compliant with iron BID started with hospitalization.  She has discontinued Eliquis after 5 month course of this for PE.  She is a [employer] and declines blood transfusion.  Diabetes - continue Metformin and lets increase Mounjaro over the next few months.  Increase from 7.5mg  to 10mg  for 1 month, then increase to 12.5mg , then recheck in 2 months here  Keirah was seen today for hospitalization follow-up.  Diagnoses and all orders for this visit:  Anemia, unspecified type -     Magnesium -     Basic metabolic panel -     CBC  Type 2 diabetes mellitus with obesity (HCC)  Primary hypertension -     Basic metabolic panel  Hematochezia  Hemorrhoids, unspecified hemorrhoid type  Other orders -     tirzepatide (MOUNJARO) 10 MG/0.5ML Pen; Inject 10 mg into the skin once a week. -     tirzepatide (MOUNJARO) 12.5 MG/0.5ML Pen; Inject 12.5 mg into the skin once a week.   Follow up: pending labs, hematology f/u

## 2024-02-01 LAB — BASIC METABOLIC PANEL
BUN/Creatinine Ratio: 15 (ref 12–28)
BUN: 16 mg/dL (ref 8–27)
CO2: 21 mmol/L (ref 20–29)
Calcium: 9.6 mg/dL (ref 8.7–10.3)
Chloride: 106 mmol/L (ref 96–106)
Creatinine, Ser: 1.08 mg/dL — ABNORMAL HIGH (ref 0.57–1.00)
Glucose: 143 mg/dL — ABNORMAL HIGH (ref 70–99)
Potassium: 4.4 mmol/L (ref 3.5–5.2)
Sodium: 142 mmol/L (ref 134–144)
eGFR: 55 mL/min/{1.73_m2} — ABNORMAL LOW (ref >59)

## 2024-02-01 LAB — MAGNESIUM: Magnesium: 1.7 mg/dL (ref 1.6–2.3)

## 2024-02-01 LAB — CBC
Hematocrit: 24.4 % — ABNORMAL LOW (ref 34.0–46.6)
Hemoglobin: 7.6 g/dL — ABNORMAL LOW (ref 11.1–15.9)
MCH: 29.5 pg (ref 26.6–33.0)
MCHC: 31.1 g/dL — ABNORMAL LOW (ref 31.5–35.7)
MCV: 95 fL (ref 79–97)
NRBC: 1 % — ABNORMAL HIGH (ref 0–0)
Platelets: 323 10*3/uL (ref 150–450)
RBC: 2.58 x10E6/uL — CL (ref 3.77–5.28)
RDW: 13.8 % (ref 11.7–15.4)
WBC: 7.9 10*3/uL (ref 3.4–10.8)

## 2024-02-01 NOTE — Progress Notes (Signed)
Hello Dr. Milus Mallick saw this patient in the hospital recently.  She is supposed to be having follow-up with you soon for outpatient visit from hospitalization.  She is a [employer] and does not get blood transfusions.  She had 2 back-to-back infusions of Venofer with her recent hospitalization.  Her hemoglobin has barely improved  Do want me to set her up for Feraheme or another Venofer within the next week before she sees you?  You also recommended Procrit in the hospital but I do not believe she got this.  I does want to touch base with you to help with this transition of care and to get her in with you posthospital follow-up  Thanks Vincenza Hews

## 2024-02-05 ENCOUNTER — Telehealth: Payer: Self-pay | Admitting: Internal Medicine

## 2024-02-05 DIAGNOSIS — D649 Anemia, unspecified: Secondary | ICD-10-CM

## 2024-02-05 NOTE — Telephone Encounter (Signed)
-----   Message from [place] H sent at 02/05/2024 11:28 AM EST ----- Hey I received a vm from this patient about a hematology referral. There isn't anything entered.

## 2024-02-05 NOTE — Telephone Encounter (Signed)
 Please advise if we need to put order in for hematology

## 2024-02-06 NOTE — Addendum Note (Signed)
 Addended by: Herminio Commons A on: 02/06/2024 09:03 AM   Modules accepted: Orders

## 2024-02-06 NOTE — Telephone Encounter (Signed)
 I have put in hematology referral as patient has never seen one before

## 2024-02-07 ENCOUNTER — Ambulatory Visit: Payer: 59 | Admitting: Medical

## 2024-02-13 NOTE — Progress Notes (Signed)
 HEMATOLOGY/ONCOLOGY CONSULTATION NOTE  Date of Service: 02/13/2024  Patient Care Team: Genia Del as PCP - General (Family Medicine) [facility]  CHIEF COMPLAINTS/PURPOSE OF CONSULTATION:  Evaluation and management of anemia  HISTORY OF PRESENTING ILLNESS:   [name] is a wonderful Jehovah's Witness 73 y.o. female who has been referred to [place] by Kaylee Canavan, PA-C for evaluation and management of anemia and hx of PE.   She presented to the ED on 01/24/2024 for rectal bleeding. Colonoscopy showed diverticulosis and internal hemorrhoids.   Patient was admitted in September 2024 for PE.    Today, she reports that she received two units of IV iron infusions while she was recently hospitalized as well as an injection and oral iron. Patient has not received IV iron since being discharged from the hospital and has not yet been set up for additional IV iron. Patient has been taking two tablets of oral iron daily since the day after being discharged.   Patient does endorse dark stools from oral iron. Her oral iron does cause constipation, which is managed with stool softener. She does take a laxative on a regular basis for constipation management.  She denies any blood in her stools since her discharge from the hospital.   She reports that her rectal bleeding episode occurred while being on blood thinners, and she has been off of blood thinners since.   She denies any hx of GI bleeding prior to her most recent episode. Patient notes that multiple siblings do have diverticulitis.  She reports that a cause for her blood clot in September had not been determined. Patient denies any hx of blood clots in the past prior to her blood clot in September.   Patient reports that her son did have a blood clot in the knee which traveled to his lung, which was provoked from knee replacement surgery.   Patient denies any other known inherited clotting  disorder in the family. She has not been tested for an inherited clotting disorder.   Patient reports a hx of chronic bronchitis managed with an inhaler. She reports that bronchitis did not cause increased immobility at that time.   Patient reports that there was a period prior to her admission in September 2024 in which she endorsed breathing issues and pain with deep breaths.   She denies having a COVID-19 infection, long distance travel, or surgeries at the time of her blood clot. She denies having any leg trauma and denies having any pain or swelling in the leg at that time.  Patient has a hx of bilateral knee replacements, which were not done around the time of her blood clot.   She reports that she is UTD with her cancer screenings and her last mammogram showed no concerns.   Patient does use Mounjaro and reports that she has lost about 30s pounds over 7 months.   Patient is a never smoker but does report second-hand smoke exposure growing up.   She complains of lower back pain limiting her walking ability. She was seen by a spine specialist previously and it was thought that her back pain was due to arthritis. Patient did previously receive injections which did not improve her symptoms. She reports that there is no need for surgical intervention.  Patient reports that she is unable to engage in physical therapy due to back pain. She is able to engage in stationary cycling. Patient reports that she uses a  walker when walking to her car and is able to move around stores by stabilizing herself with shopping carts.   Patient has used a back brace which has not been very helpful. Her PCP recommended that she manage pain with Tylenol.   She reports intermittent SOB attributed to feeling anxious. She denies any abdominal pain, but does report gassiness. Patient denies any lightheadedness, dizziness, or other new concerns. She has no leg swelling at this time.   She reports that her HTN has  been stable. However, in regards to her DM, her blood glucose levels have been elevated recently.  She reports having chest pain last week, which has improved.   Her PCP is Dr. Aleen Campi.   MEDICAL HISTORY:  Past Medical History:  Diagnosis Date   Abdominal hernia    Acute blood loss as cause of postoperative anemia 02/16/2017   Arthritis    knees, back, R ankle    Bilateral carpal tunnel syndrome 04/29/2012   Diabetes mellitus    Diabetes mellitus type 2 in obese 12/31/2015   Edema    legs   GERD (gastroesophageal reflux disease)    HOH (hard of hearing)    Hypertension    Hypertriglyceridemia 08/30/2015   Started on medication 08/30/15   Lipoma 09/30/2012   Obesity    morbid obesity   OBESITY, MORBID 10/16/2007   Qualifier: Diagnosis of  By: Abby Potash     Osteoarthrosis involving lower leg 10/16/2007   Qualifier: Diagnosis of  By: Abby Potash     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   right   CARPAL TUNNEL RELEASE Left 07/10/2014   Procedure: LEFT ENDOSCOPIC CARPAL TUNNEL RELEASE ;  Surgeon: Jodi Marble, MD;  Location: [facility];  Service: Orthopedics;  Laterality: Left;   CARPAL TUNNEL RELEASE Right    COLONOSCOPY     COLONOSCOPY WITH PROPOFOL N/A 01/27/2024   Procedure: COLONOSCOPY WITH PROPOFOL;  Surgeon: Ottie Glazier, MD;  Location: [facility] ENDOSCOPY;  Service: Gastroenterology;  Laterality: N/A;   HERNIA REPAIR  07/03/2011   abd hernia   NOSE SURGERY  2008   sinus   TOTAL KNEE ARTHROPLASTY Left 02/06/2017   Procedure: LEFT TOTAL KNEE ARTHROPLASTY;  Surgeon: Kathryne Hitch, MD;  Location: [facility] OR;  Service: Orthopedics;  Laterality: Left;   TOTAL KNEE  ARTHROPLASTY Right 08/21/2017   TOTAL KNEE ARTHROPLASTY Right 08/21/2017   Procedure: RIGHT TOTAL KNEE ARTHROPLASTY;  Surgeon: Kathryne Hitch, MD;  Location: [facility] OR;  Service: Orthopedics;  Laterality: Right;   TUBAL LIGATION      SOCIAL HISTORY: Social History   Socioeconomic History   Marital status: Single    Spouse name: Not on file   Number of children: 2   Years of education: Not on file   Highest education level: Associate degree: occupational, Scientist, product/process development, or vocational program  Occupational History   Occupation: UNEMPLOYED Theatre stage manager: UNEMPLOYED  Tobacco Use   Smoking status: Never   Smokeless tobacco: Never  Vaping Use   Vaping status: Never Used  Substance and Sexual Activity   Alcohol use: Yes    Comment: occ wine   Drug use: No   Sexual activity: Never  Other Topics Concern   Not on file  Social History Narrative   Admitted to [facility] 08/24/17   Divorced   Jehovah Witness    Never smoked   Alcohol none   Full Code   Right handed    Some caffeine in take    Social Drivers of Health   Financial Resource Strain: Low Risk  (02/06/2024)   Overall Financial Resource Strain (CARDIA)    Difficulty of Paying Living Expenses: Not very hard  Food Insecurity: No Food Insecurity (02/06/2024)   Hunger Vital Sign    Worried About Running Out of Food in the Last Year: Never true    Ran Out of Food in the Last Year: Never true  Transportation Needs: No Transportation Needs (02/06/2024)   PRAPARE - Administrator, Civil Service (Medical): No    Lack of Transportation (Non-Medical): No  Physical Activity: Inactive (02/06/2024)   Exercise Vital Sign    Days of Exercise per Week: 0 days    Minutes of Exercise per Session: 0 min  Stress: No Stress Concern Present (02/06/2024)   [employer] of Occupational Health - Occupational Stress Questionnaire    Feeling of Stress : Not at all  Social Connections: Moderately Integrated  (02/06/2024)   Social Connection and Isolation Panel [NHANES]    Frequency of Communication with Friends and Family: More than three times a week    Frequency of Social Gatherings with Friends and Family: Twice a week    Attends Religious Services: More than 4 times per year    Active Member of [employer] or Organizations: Yes    Attends Engineer, structural: More than 4 times per year    Marital Status: Divorced  Intimate Partner Violence: Not At Risk (01/24/2024)   Humiliation, Afraid, Rape, and Kick questionnaire    Fear of Current or Ex-Partner: No    Emotionally Abused: No    Physically Abused: No    Sexually Abused: No    FAMILY HISTORY: Family History  Problem Relation Age of Onset   Hypertension Mother    Kidney disease Mother    Diabetes Father    Hypertension Father    Diabetes Sister    Hypertension Sister    Hypertension Sister    Diabetes Brother    Hypertension Brother    Heart disease Brother 39       heart attack   Colon cancer Neg Hx    Esophageal cancer Neg Hx    Colon polyps Neg Hx    Pancreatic cancer Neg Hx    Rectal cancer Neg Hx    Stomach cancer Neg Hx     ALLERGIES:  is allergic to other, crestor [rosuvastatin], and sulfa antibiotics.  MEDICATIONS:  Current Outpatient Medications  Medication Sig Dispense Refill   Accu-Chek Softclix Lancets lancets TEST 1 TO 2 TIMES DAILY 100 each 1   allopurinol (ZYLOPRIM) 100 MG tablet TAKE 1 TABLET BY MOUTH DAILY 90 tablet 3   Blood Glucose Monitoring Suppl (SOLUS V2 BLOOD GLUCOSE SYSTEM) DEVI Test 1-2 times a day Dx E11.9 1 each 0   Cyanocobalamin (VITAMIN B-12 PO) Take 1 tablet by mouth daily at 2 PM.     fenofibrate (TRICOR) 145 MG tablet TAKE 1 TABLET(145 MG) BY MOUTH DAILY 90 tablet 1   ferrous gluconate (FERGON) 240 (27 FE) MG tablet Take 1 tablet (240 mg total) by mouth 2 (two) times daily before a meal. 60 tablet 2  glucose blood (ACCU-CHEK GUIDE) test strip TEST 1 TO 2 TIMES DAILY 200 strip 1    losartan-hydrochlorothiazide (HYZAAR) 100-12.5 MG tablet TAKE 1 TABLET BY MOUTH DAILY 100 tablet 1   MAGNESIUM CITRATE PO Take 1 tablet by mouth daily as needed.     metFORMIN (GLUCOPHAGE) 1000 MG tablet Take 1 tablet (1,000 mg total) by mouth 2 (two) times daily with a meal. 180 tablet 2   methocarbamol (ROBAXIN) 500 MG tablet Take 1 tablet (500 mg total) by mouth every 8 (eight) hours as needed for muscle spasms. (Patient not taking: Reported on 01/31/2024) 15 tablet 0   Multiple Vitamin (MULTIVITAMIN) tablet Take 1 tablet by mouth daily.     tirzepatide (MOUNJARO) 10 MG/0.5ML Pen Inject 10 mg into the skin once a week. 6 mL 0   tirzepatide (MOUNJARO) 12.5 MG/0.5ML Pen Inject 12.5 mg into the skin once a week. 6 mL 0   No current facility-administered medications for this visit.    REVIEW OF SYSTEMS:    10 Point review of Systems was done is negative except as noted above.  PHYSICAL EXAMINATION: ECOG PERFORMANCE STATUS: 1 - Symptomatic but completely ambulatory  . Vitals:   02/14/24 0947  BP: 132/67  Pulse: 100  Resp: 17  Temp: (!) 97.5 F (36.4 C)  SpO2: 99%   Filed Weights   02/14/24 0947  Weight: (!) 307 lb 9.6 oz (139.5 kg)   .Body mass index is 49.65 kg/m.  GENERAL:alert, in no acute distress and comfortable SKIN: no acute rashes, no significant lesions EYES: conjunctiva are pink and non-injected, sclera anicteric OROPHARYNX: MMM, no exudates, no oropharyngeal erythema or ulceration NECK: supple, no JVD LYMPH:  no palpable lymphadenopathy in the cervical, axillary or inguinal regions LUNGS: clear to auscultation b/l with normal respiratory effort HEART: regular rate & rhythm ABDOMEN:  normoactive bowel sounds , non tender, not distended. Extremity: no pedal edema PSYCH: alert & oriented x 3 with fluent speech NEURO: no focal motor/sensory deficits  LABORATORY DATA:  I have reviewed the data as listed  Colonoscopy 01/27/2024:    .    Latest Ref Rng & Units  02/14/2024   11:07 AM 01/31/2024    2:29 PM 01/28/2024    4:51 AM  CBC  WBC 4.0 - 10.5 K/uL 5.8  7.9    Hemoglobin 12.0 - 15.0 g/dL 8.9  7.6  6.7   Hematocrit 36.0 - 46.0 % 30.1  24.4  21.6   Platelets 150 - 400 K/uL 388  323      .    Latest Ref Rng & Units 02/14/2024   11:07 AM 01/31/2024    2:29 PM 01/26/2024    7:43 AM  CMP  Glucose 70 - 99 mg/dL 161  096  045   BUN 8 - 23 mg/dL 25  16  15    Creatinine 0.44 - 1.00 mg/dL 4.09  8.11  9.14   Sodium 135 - 145 mmol/L 140  142  139   Potassium 3.5 - 5.1 mmol/L 4.2  4.4  3.9   Chloride 98 - 111 mmol/L 105  106  107   CO2 22 - 32 mmol/L 29  21  23    Calcium 8.9 - 10.3 mg/dL 9.7  9.6  8.9   Total Protein 6.5 - 8.1 g/dL 7.1     Total Bilirubin 0.0 - 1.2 mg/dL 0.3     Alkaline Phos 38 - 126 U/L 53     AST 15 - 41 U/L 9  ALT 0 - 44 U/L 11        RADIOGRAPHIC STUDIES: I have personally reviewed the radiological images as listed and agreed with the findings in the report. No results found.  ASSESSMENT & PLAN:  73 y.o. female of Jehovah's Witness faith with:  Anemia from blood loss and resulting iron deficiency Hx of pulmonary embolism 3. Jehova's witness. Patient notes she would not accept prbc transfusion under any circumstances. PLAN:  -CBC from two weeks ago showed WBC of 7.9K, hemoglobin of 7.6, and platelets of 323K. -patient has anemia from rectal blood loss -discussed ferritin goal of more than 200 -Colonoscopy on 01/27/2024 showed:Diverticulosis in the sigmoid colon and in the descending colon. Given spontaneous cessation of bleeding and absence of other findings during colonoscopy suspect diverticular bleeding was the culprit etiology of the patient' s presentation. 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 - One 5 mm polyp in the proximal ascending colon. - The examined portion of the ileum was normal. - Internal hemorrhoids. - No specimens collected. -venous doppler US from 08/2023 showed findings consistent with acute deep  vein thrombosis involving the left peroneal veins in the left lower extremity  -there is no obvious new triggering event causing blood clots -discussed that there are some soft risk factors including morbid obesity .Body mass index is 49.65 kg/m. -discussed that her immobilization from back pain is a risk factor for blood clots -given her blood clot is unprovoked, she would be given the option of blood testing down the line to tease out any particular risk factor such as inherited clotting disorder or abnormal antibody, which patient is agreeable with.  -will hold hypercoagulability panel at this time to limit blood loss until her hgb is stable.  -will order only basic labs today including CBC and CMP -discussed that with unprovoked blood clots, the risk of repeat clots may continue to exist and she may eventually need blood thinners in the longer term.  -discussed that there is a 1/3 risk of repeat diverticular bleeding. Discussed that blood thinners increases the risk of significant bleeding. The inability to transfuse patient does make the bleeding element much more concerning.  -will hold off on blood thinners at this time. Discussed that once her hgb is normal, there is no bleeding concern for a while, and depending on blood tests, recommends in regards to anticoagulation can be made -recommend risk reduction, including staying well-hydrated, using knee-high compression socks, avoiding sitting on the ground with folded legs, staying regularly active, and continuing to lose weight with goal of being as close to ideal body weight as possible. 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 Also discussed option of massage therapy  FOLLOW-UP: Labs today IV venofer 300mg  weekly x 4 doses Retacrit weekly x 4 Weekly labs RTC with Dr Candise Che in 2 weeks  The total time spent in the appointment was 60 minutes* .  All of the patient's questions were answered with apparent satisfaction.  The patient knows to call the clinic with any problems, questions or concerns.   Wyvonnia Lora MD MS AAHIVMS [facility] Hematology/Oncology Physician [facility]  .*Total Encounter Time as defined by the Centers for Medicare and Medicaid Services includes, in addition to the face-to-face time of a patient visit (documented in the note above) non-face-to-face time: obtaining and reviewing outside history, ordering and reviewing medications, tests or procedures, care coordination (communications with other health care professionals or caregivers) and documentation in the medical record.    I,Mitra Faeizi,acting as a Neurosurgeon for Wyvonnia Lora, MD.,have documented all relevant documentation on the behalf of Wyvonnia Lora, MD,as directed by  Wyvonnia Lora, MD while in the presence of Wyvonnia Lora, MD.  .I have reviewed the above documentation for accuracy and completeness, and I agree with the above. Johney Maine MD

## 2024-02-14 ENCOUNTER — Inpatient Hospital Stay: Payer: 59 | Attending: Hematology | Admitting: Hematology

## 2024-02-14 ENCOUNTER — Ambulatory Visit: Payer: 59

## 2024-02-14 ENCOUNTER — Other Ambulatory Visit: Payer: 59

## 2024-02-14 ENCOUNTER — Inpatient Hospital Stay: Payer: 59

## 2024-02-14 VITALS — BP 132/67 | HR 100 | Temp 97.5°F | Resp 17 | Wt 307.6 lb

## 2024-02-14 DIAGNOSIS — Z79899 Other long term (current) drug therapy: Secondary | ICD-10-CM | POA: Insufficient documentation

## 2024-02-14 DIAGNOSIS — R0602 Shortness of breath: Secondary | ICD-10-CM | POA: Insufficient documentation

## 2024-02-14 DIAGNOSIS — D509 Iron deficiency anemia, unspecified: Secondary | ICD-10-CM | POA: Insufficient documentation

## 2024-02-14 DIAGNOSIS — D508 Other iron deficiency anemias: Secondary | ICD-10-CM

## 2024-02-14 DIAGNOSIS — Z6841 Body Mass Index (BMI) 40.0 and over, adult: Secondary | ICD-10-CM | POA: Diagnosis not present

## 2024-02-14 DIAGNOSIS — I10 Essential (primary) hypertension: Secondary | ICD-10-CM | POA: Insufficient documentation

## 2024-02-14 DIAGNOSIS — D500 Iron deficiency anemia secondary to blood loss (chronic): Secondary | ICD-10-CM | POA: Diagnosis not present

## 2024-02-14 DIAGNOSIS — Z7722 Contact with and (suspected) exposure to environmental tobacco smoke (acute) (chronic): Secondary | ICD-10-CM | POA: Insufficient documentation

## 2024-02-14 DIAGNOSIS — Z86711 Personal history of pulmonary embolism: Secondary | ICD-10-CM | POA: Insufficient documentation

## 2024-02-14 DIAGNOSIS — E1159 Type 2 diabetes mellitus with other circulatory complications: Secondary | ICD-10-CM | POA: Diagnosis not present

## 2024-02-14 LAB — CBC WITH DIFFERENTIAL (CANCER CENTER ONLY)
Abs Immature Granulocytes: 0.01 10*3/uL (ref 0.00–0.07)
Basophils Absolute: 0 10*3/uL (ref 0.0–0.1)
Basophils Relative: 0 %
Eosinophils Absolute: 0.1 10*3/uL (ref 0.0–0.5)
Eosinophils Relative: 1 %
HCT: 30.1 % — ABNORMAL LOW (ref 36.0–46.0)
Hemoglobin: 8.9 g/dL — ABNORMAL LOW (ref 12.0–15.0)
Immature Granulocytes: 0 %
Lymphocytes Relative: 20 %
Lymphs Abs: 1.2 10*3/uL (ref 0.7–4.0)
MCH: 29.6 pg (ref 26.0–34.0)
MCHC: 29.6 g/dL — ABNORMAL LOW (ref 30.0–36.0)
MCV: 100 fL (ref 80.0–100.0)
Monocytes Absolute: 0.4 10*3/uL (ref 0.1–1.0)
Monocytes Relative: 7 %
Neutro Abs: 4.1 10*3/uL (ref 1.7–7.7)
Neutrophils Relative %: 72 %
Platelet Count: 388 10*3/uL (ref 150–400)
RBC: 3.01 MIL/uL — ABNORMAL LOW (ref 3.87–5.11)
RDW: 15.7 % — ABNORMAL HIGH (ref 11.5–15.5)
WBC Count: 5.8 10*3/uL (ref 4.0–10.5)
nRBC: 0 % (ref 0.0–0.2)

## 2024-02-14 LAB — CMP (CANCER CENTER ONLY)
ALT: 11 U/L (ref 0–44)
AST: 9 U/L — ABNORMAL LOW (ref 15–41)
Albumin: 4 g/dL (ref 3.5–5.0)
Alkaline Phosphatase: 53 U/L (ref 38–126)
Anion gap: 6 (ref 5–15)
BUN: 25 mg/dL — ABNORMAL HIGH (ref 8–23)
CO2: 29 mmol/L (ref 22–32)
Calcium: 9.7 mg/dL (ref 8.9–10.3)
Chloride: 105 mmol/L (ref 98–111)
Creatinine: 1.31 mg/dL — ABNORMAL HIGH (ref 0.44–1.00)
GFR, Estimated: 44 mL/min — ABNORMAL LOW (ref >60)
Glucose, Bld: 201 mg/dL — ABNORMAL HIGH (ref 70–99)
Potassium: 4.2 mmol/L (ref 3.5–5.1)
Sodium: 140 mmol/L (ref 135–145)
Total Bilirubin: 0.3 mg/dL (ref 0.0–1.2)
Total Protein: 7.1 g/dL (ref 6.5–8.1)

## 2024-02-14 LAB — IRON AND IRON BINDING CAPACITY (CC-WL,HP ONLY)
Iron: 61 ug/dL (ref 28–170)
Saturation Ratios: 14 % (ref 10.4–31.8)
TIBC: 452 ug/dL — ABNORMAL HIGH (ref 250–450)
UIBC: 391 ug/dL (ref 148–442)

## 2024-02-14 LAB — FERRITIN: Ferritin: 22 ng/mL (ref 11–307)

## 2024-02-14 LAB — VITAMIN B12: Vitamin B-12: 1033 pg/mL — ABNORMAL HIGH (ref 180–914)

## 2024-02-15 ENCOUNTER — Other Ambulatory Visit: Payer: 59

## 2024-02-15 ENCOUNTER — Ambulatory Visit: Payer: 59

## 2024-02-20 ENCOUNTER — Encounter: Payer: Self-pay | Admitting: Hematology

## 2024-02-21 ENCOUNTER — Ambulatory Visit: Payer: 59

## 2024-02-21 ENCOUNTER — Other Ambulatory Visit: Payer: 59

## 2024-02-21 ENCOUNTER — Other Ambulatory Visit: Payer: Self-pay

## 2024-02-21 DIAGNOSIS — D508 Other iron deficiency anemias: Secondary | ICD-10-CM

## 2024-02-22 ENCOUNTER — Inpatient Hospital Stay: Payer: 59

## 2024-02-22 ENCOUNTER — Inpatient Hospital Stay: Payer: 59 | Attending: Hematology

## 2024-02-22 ENCOUNTER — Other Ambulatory Visit: Payer: 59

## 2024-02-22 VITALS — BP 126/62 | HR 81 | Temp 98.3°F | Resp 18

## 2024-02-22 DIAGNOSIS — D500 Iron deficiency anemia secondary to blood loss (chronic): Secondary | ICD-10-CM | POA: Diagnosis not present

## 2024-02-22 DIAGNOSIS — D508 Other iron deficiency anemias: Secondary | ICD-10-CM

## 2024-02-22 DIAGNOSIS — Z79899 Other long term (current) drug therapy: Secondary | ICD-10-CM | POA: Insufficient documentation

## 2024-02-22 LAB — CMP (CANCER CENTER ONLY)
ALT: 11 U/L (ref 0–44)
AST: 10 U/L — ABNORMAL LOW (ref 15–41)
Albumin: 4.1 g/dL (ref 3.5–5.0)
Alkaline Phosphatase: 50 U/L (ref 38–126)
Anion gap: 9 (ref 5–15)
BUN: 30 mg/dL — ABNORMAL HIGH (ref 8–23)
CO2: 26 mmol/L (ref 22–32)
Calcium: 9.3 mg/dL (ref 8.9–10.3)
Chloride: 106 mmol/L (ref 98–111)
Creatinine: 1.42 mg/dL — ABNORMAL HIGH (ref 0.44–1.00)
GFR, Estimated: 40 mL/min — ABNORMAL LOW (ref >60)
Glucose, Bld: 216 mg/dL — ABNORMAL HIGH (ref 70–99)
Potassium: 4.1 mmol/L (ref 3.5–5.1)
Sodium: 141 mmol/L (ref 135–145)
Total Bilirubin: 0.3 mg/dL (ref 0.0–1.2)
Total Protein: 7.1 g/dL (ref 6.5–8.1)

## 2024-02-22 LAB — CBC WITH DIFFERENTIAL (CANCER CENTER ONLY)
Abs Immature Granulocytes: 0.02 10*3/uL (ref 0.00–0.07)
Basophils Absolute: 0 10*3/uL (ref 0.0–0.1)
Basophils Relative: 1 %
Eosinophils Absolute: 0.1 10*3/uL (ref 0.0–0.5)
Eosinophils Relative: 1 %
HCT: 32.2 % — ABNORMAL LOW (ref 36.0–46.0)
Hemoglobin: 9.4 g/dL — ABNORMAL LOW (ref 12.0–15.0)
Immature Granulocytes: 0 %
Lymphocytes Relative: 18 %
Lymphs Abs: 1.2 10*3/uL (ref 0.7–4.0)
MCH: 28.8 pg (ref 26.0–34.0)
MCHC: 29.2 g/dL — ABNORMAL LOW (ref 30.0–36.0)
MCV: 98.8 fL (ref 80.0–100.0)
Monocytes Absolute: 0.4 10*3/uL (ref 0.1–1.0)
Monocytes Relative: 6 %
Neutro Abs: 4.9 10*3/uL (ref 1.7–7.7)
Neutrophils Relative %: 74 %
Platelet Count: 345 10*3/uL (ref 150–400)
RBC: 3.26 MIL/uL — ABNORMAL LOW (ref 3.87–5.11)
RDW: 15.1 % (ref 11.5–15.5)
WBC Count: 6.6 10*3/uL (ref 4.0–10.5)
nRBC: 0 % (ref 0.0–0.2)

## 2024-02-22 LAB — FERRITIN: Ferritin: 11 ng/mL (ref 11–307)

## 2024-02-22 LAB — IRON AND IRON BINDING CAPACITY (CC-WL,HP ONLY)
Iron: 54 ug/dL (ref 28–170)
Saturation Ratios: 11 % (ref 10.4–31.8)
TIBC: 484 ug/dL — ABNORMAL HIGH (ref 250–450)
UIBC: 430 ug/dL (ref 148–442)

## 2024-02-22 LAB — VITAMIN B12: Vitamin B-12: 1007 pg/mL — ABNORMAL HIGH (ref 180–914)

## 2024-02-22 MED ORDER — SODIUM CHLORIDE 0.9 % IV SOLN: Freq: Once | INTRAVENOUS | Status: AC

## 2024-02-22 MED ORDER — LORATADINE 10 MG PO TABS
10.0000 mg | ORAL_TABLET | Freq: Once | ORAL | Status: AC
Start: 2024-02-22 — End: 2024-02-22
  Administered 2024-02-22: 10 mg via ORAL
  Filled 2024-02-22: qty 1

## 2024-02-22 MED ORDER — SODIUM CHLORIDE 0.9 % IV SOLN
300.0000 mg | Freq: Once | INTRAVENOUS | Status: AC
  Administered 2024-02-22: 300.0065 mg via INTRAVENOUS
  Filled 2024-02-22: qty 15
  Filled 2024-02-22: qty 300

## 2024-02-22 MED ORDER — ACETAMINOPHEN 325 MG PO TABS
650.0000 mg | ORAL_TABLET | Freq: Once | ORAL | Status: AC
  Administered 2024-02-22: 650 mg via ORAL
  Filled 2024-02-22: qty 2

## 2024-02-22 NOTE — Patient Instructions (Signed)

## 2024-02-22 NOTE — Progress Notes (Signed)
Pt observed for 30 minutes post Venofer infusion. Pt tolerated Tx well w/out incident. VSS at discharge.  Ambulatory to lobby.

## 2024-02-27 ENCOUNTER — Telehealth: Payer: Self-pay | Admitting: Hematology

## 2024-02-27 NOTE — Telephone Encounter (Signed)
 Rescheduled appointments and left a voicemail informing the patient.

## 2024-02-28 ENCOUNTER — Other Ambulatory Visit: Payer: Self-pay

## 2024-02-28 DIAGNOSIS — D508 Other iron deficiency anemias: Secondary | ICD-10-CM

## 2024-02-29 ENCOUNTER — Telehealth: Payer: Self-pay | Admitting: *Deleted

## 2024-02-29 ENCOUNTER — Other Ambulatory Visit: Payer: 59

## 2024-02-29 ENCOUNTER — Inpatient Hospital Stay (HOSPITAL_BASED_OUTPATIENT_CLINIC_OR_DEPARTMENT_OTHER): Payer: 59 | Admitting: Hematology

## 2024-02-29 ENCOUNTER — Inpatient Hospital Stay: Payer: 59

## 2024-02-29 VITALS — BP 119/61 | HR 74 | Temp 98.2°F | Resp 18 | Wt 307.0 lb

## 2024-02-29 DIAGNOSIS — D508 Other iron deficiency anemias: Secondary | ICD-10-CM

## 2024-02-29 DIAGNOSIS — D500 Iron deficiency anemia secondary to blood loss (chronic): Secondary | ICD-10-CM | POA: Diagnosis not present

## 2024-02-29 DIAGNOSIS — Z79899 Other long term (current) drug therapy: Secondary | ICD-10-CM | POA: Diagnosis not present

## 2024-02-29 LAB — CBC WITH DIFFERENTIAL (CANCER CENTER ONLY)
Abs Immature Granulocytes: 0.02 10*3/uL (ref 0.00–0.07)
Basophils Absolute: 0 10*3/uL (ref 0.0–0.1)
Basophils Relative: 1 %
Eosinophils Absolute: 0.1 10*3/uL (ref 0.0–0.5)
Eosinophils Relative: 1 %
HCT: 33.2 % — ABNORMAL LOW (ref 36.0–46.0)
Hemoglobin: 9.8 g/dL — ABNORMAL LOW (ref 12.0–15.0)
Immature Granulocytes: 0 %
Lymphocytes Relative: 19 %
Lymphs Abs: 1.1 10*3/uL (ref 0.7–4.0)
MCH: 29.7 pg (ref 26.0–34.0)
MCHC: 29.5 g/dL — ABNORMAL LOW (ref 30.0–36.0)
MCV: 100.6 fL — ABNORMAL HIGH (ref 80.0–100.0)
Monocytes Absolute: 0.3 10*3/uL (ref 0.1–1.0)
Monocytes Relative: 4 %
Neutro Abs: 4.5 10*3/uL (ref 1.7–7.7)
Neutrophils Relative %: 75 %
Platelet Count: 328 10*3/uL (ref 150–400)
RBC: 3.3 MIL/uL — ABNORMAL LOW (ref 3.87–5.11)
RDW: 15.4 % (ref 11.5–15.5)
WBC Count: 5.9 10*3/uL (ref 4.0–10.5)
nRBC: 0 % (ref 0.0–0.2)

## 2024-02-29 LAB — CMP (CANCER CENTER ONLY)
ALT: 12 U/L (ref 0–44)
AST: 11 U/L — ABNORMAL LOW (ref 15–41)
Albumin: 4.1 g/dL (ref 3.5–5.0)
Alkaline Phosphatase: 52 U/L (ref 38–126)
Anion gap: 8 (ref 5–15)
BUN: 30 mg/dL — ABNORMAL HIGH (ref 8–23)
CO2: 26 mmol/L (ref 22–32)
Calcium: 9.5 mg/dL (ref 8.9–10.3)
Chloride: 108 mmol/L (ref 98–111)
Creatinine: 1.34 mg/dL — ABNORMAL HIGH (ref 0.44–1.00)
GFR, Estimated: 43 mL/min — ABNORMAL LOW (ref >60)
Glucose, Bld: 210 mg/dL — ABNORMAL HIGH (ref 70–99)
Potassium: 4 mmol/L (ref 3.5–5.1)
Sodium: 142 mmol/L (ref 135–145)
Total Bilirubin: 0.2 mg/dL (ref 0.0–1.2)
Total Protein: 6.9 g/dL (ref 6.5–8.1)

## 2024-02-29 LAB — IRON AND IRON BINDING CAPACITY (CC-WL,HP ONLY)
Iron: 47 ug/dL (ref 28–170)
Saturation Ratios: 10 % — ABNORMAL LOW (ref 10.4–31.8)
TIBC: 459 ug/dL — ABNORMAL HIGH (ref 250–450)
UIBC: 412 ug/dL (ref 148–442)

## 2024-02-29 LAB — VITAMIN B12: Vitamin B-12: 1191 pg/mL — ABNORMAL HIGH (ref 180–914)

## 2024-02-29 LAB — FERRITIN: Ferritin: 81 ng/mL (ref 11–307)

## 2024-02-29 MED ORDER — SODIUM CHLORIDE 0.9 % IV SOLN
300.0000 mg | Freq: Once | INTRAVENOUS | Status: AC
  Administered 2024-02-29: 300 mg via INTRAVENOUS
  Filled 2024-02-29: qty 300

## 2024-02-29 MED ORDER — ACETAMINOPHEN 325 MG PO TABS
650.0000 mg | ORAL_TABLET | Freq: Once | ORAL | Status: AC
  Administered 2024-02-29: 650 mg via ORAL
  Filled 2024-02-29: qty 2

## 2024-02-29 MED ORDER — LORATADINE 10 MG PO TABS
10.0000 mg | ORAL_TABLET | Freq: Once | ORAL | Status: AC
  Administered 2024-02-29: 10 mg via ORAL
  Filled 2024-02-29: qty 1

## 2024-02-29 MED ORDER — SODIUM CHLORIDE 0.9 % IV SOLN: INTRAVENOUS | Status: DC

## 2024-02-29 NOTE — Patient Instructions (Signed)

## 2024-02-29 NOTE — Telephone Encounter (Signed)
 Patient was identified as falling into the [place] Measure - Diabetes.   Patient was: Left voicemail to schedule with primary care provider.

## 2024-03-04 ENCOUNTER — Ambulatory Visit: Payer: 59

## 2024-03-04 DIAGNOSIS — Z0000 Encounter for general adult medical examination without abnormal findings: Secondary | ICD-10-CM

## 2024-03-04 NOTE — Patient Instructions (Signed)
 [name] , Thank you for taking time to come for your Medicare Wellness Visit. I appreciate your ongoing commitment to your health goals. Please review the following plan we discussed and let me know if I can assist you in the future.   Referrals/Orders/Follow-Ups/Clinician Recommendations: none  This is a list of the screening recommended for you and due dates:  Health Maintenance  Topic Date Due   COVID-19 Vaccine (6 - 2024-25 season) 08/19/2023   Eye exam for diabetics  12/13/2023   Hemoglobin A1C  07/25/2024   Yearly kidney health urinalysis for diabetes  08/13/2024   Complete foot exam   08/13/2024   Yearly kidney function blood test for diabetes  02/28/2025   Medicare Annual Wellness Visit  03/04/2025   DTaP/Tdap/Td vaccine (3 - Td or Tdap) 09/28/2025   Mammogram  12/17/2025   Colon Cancer Screening  01/26/2034   Pneumonia Vaccine  Completed   Flu Shot  Completed   DEXA scan (bone density measurement)  Completed   Hepatitis C Screening  Completed   Zoster (Shingles) Vaccine  Completed   HPV Vaccine  Aged Out    Advanced directives: (In Chart) A copy of your advanced directives are scanned into your chart should your provider ever need it.  Next Medicare Annual Wellness Visit scheduled for next year: Yes  insert Preventive Care attachment Insert FALL PREVENTION attachment if needed

## 2024-03-04 NOTE — Progress Notes (Signed)
 Subjective:   Triva Hueber Baumgarner is a 73 y.o. who presents for a Medicare Wellness preventive visit.  Visit Complete: Virtual I connected with  Brynnly Bonet Caplin on 03/04/24 by a audio enabled telemedicine application and verified that I am speaking with the correct person using two identifiers.  Patient Location: Home  Provider Location: Office/Clinic  I discussed the limitations of evaluation and management by telemedicine. The patient expressed understanding and agreed to proceed.  Vital Signs: Because this visit was a virtual/telehealth visit, some criteria may be missing or patient reported. Any vitals not documented were not able to be obtained and vitals that have been documented are patient reported.  VideoError- Librarian, academic were attempted between this provider and patient, however failed, due to patient having technical difficulties OR patient did not have access to video capability.  We continued and completed visit with audio only.   Persons Participating in Visit: n/a  AWV Questionnaire: Yes: Patient Medicare AWV questionnaire was completed by the patient on 03/03/2024; I have confirmed that all information answered by patient is correct and no changes since this date.  Cardiac Risk Factors include: advanced age (>48men, >59 women);diabetes mellitus;hypertension     Objective:    Today's Vitals   There is no height or weight on file to calculate BMI.     03/04/2024   11:35 AM 01/27/2024   10:53 AM 01/24/2024    2:38 PM 09/09/2023   10:38 AM 09/09/2023   10:30 AM 02/27/2023   11:31 AM 02/16/2021    2:54 PM  Advanced Directives  Does Patient Have a Medical Advance Directive? Yes No;Yes No No No Yes Yes  Type of Advance Directive Out of facility DNR (pink MOST or yellow form) Living will    Out of facility DNR (pink MOST or yellow form) Healthcare Power of Pierre;Living will  Does patient want to make changes to medical advance directive?    No  - Guardian declined     Copy of Healthcare Power of Attorney in Chart?       Yes - validated most recent copy scanned in chart (See row information)  Would patient like information on creating a medical advance directive?   No - Patient declined No - Guardian declined No - Patient declined      Current Medications (verified) Outpatient Encounter Medications as of 03/04/2024  Medication Sig   Accu-Chek Softclix Lancets lancets TEST 1 TO 2 TIMES DAILY   allopurinol (ZYLOPRIM) 100 MG tablet TAKE 1 TABLET BY MOUTH DAILY   Blood Glucose Monitoring Suppl (SOLUS V2 BLOOD GLUCOSE SYSTEM) DEVI Test 1-2 times a day Dx E11.9   Cyanocobalamin (VITAMIN B-12 PO) Take 1 tablet by mouth daily at 2 PM.   fenofibrate (TRICOR) 145 MG tablet TAKE 1 TABLET(145 MG) BY MOUTH DAILY   glucose blood (ACCU-CHEK GUIDE) test strip TEST 1 TO 2 TIMES DAILY   losartan-hydrochlorothiazide (HYZAAR) 100-12.5 MG tablet TAKE 1 TABLET BY MOUTH DAILY   MAGNESIUM CITRATE PO Take 1 tablet by mouth daily as needed.   metFORMIN (GLUCOPHAGE) 1000 MG tablet Take 1 tablet (1,000 mg total) by mouth 2 (two) times daily with a meal.   Multiple Vitamin (MULTIVITAMIN) tablet Take 1 tablet by mouth daily.   tirzepatide (MOUNJARO) 10 MG/0.5ML Pen Inject 10 mg into the skin once a week.   ferrous gluconate (FERGON) 240 (27 FE) MG tablet Take 1 tablet (240 mg total) by mouth 2 (two) times daily before a meal. (Patient  not taking: Reported on 03/04/2024)   methocarbamol (ROBAXIN) 500 MG tablet Take 1 tablet (500 mg total) by mouth every 8 (eight) hours as needed for muscle spasms. (Patient not taking: Reported on 01/31/2024)   tirzepatide [facility]) 12.5 MG/0.5ML Pen Inject 12.5 mg into the skin once a week. (Patient not taking: Reported on 03/04/2024)   No facility-administered encounter medications on file as of 03/04/2024.    Allergies (verified) Other, Crestor [rosuvastatin], and Sulfa antibiotics   History: Past Medical History:  Diagnosis  Date   Abdominal hernia    Acute blood loss as cause of postoperative anemia 02/16/2017   Allergy    Arthritis    knees, back, R ankle    Bilateral carpal tunnel syndrome 04/29/2012   Diabetes mellitus    Diabetes mellitus type 2 in obese 12/31/2015   Edema    legs   GERD (gastroesophageal reflux disease)    HOH (hard of hearing)    Hypertension    Hypertriglyceridemia 08/30/2015   Started on medication 08/30/15   Lipoma 09/30/2012   Obesity    morbid obesity   OBESITY, MORBID 10/16/2007   Qualifier: Diagnosis of  By: Abby Potash     Osteoarthrosis involving lower leg 10/16/2007   Qualifier: Diagnosis of  By: Abby Potash     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   Status post total right knee replacement 08/21/2017   Subclinical hypothyroidism 10/11/2016   Tachycardia 08/22/2011   Vertigo    Vitamin D deficiency 02/16/2017   Past Surgical History:  Procedure Laterality Date   ANKLE FRACTURE SURGERY  1998?   right   CARPAL TUNNEL RELEASE Left 07/10/2014   Procedure: LEFT ENDOSCOPIC CARPAL TUNNEL RELEASE ;  Surgeon: Jodi Marble, MD;  Location: [facility];  Service: Orthopedics;  Laterality: Left;   CARPAL TUNNEL RELEASE Right    COLONOSCOPY     COLONOSCOPY WITH PROPOFOL N/A 01/27/2024   Procedure: COLONOSCOPY WITH PROPOFOL;  Surgeon: Ottie Glazier, MD;  Location: [facility] ENDOSCOPY;  Service: Gastroenterology;  Laterality: N/A;   HERNIA REPAIR  07/03/2011   abd hernia   JOINT REPLACEMENT     NOSE SURGERY  2008   sinus   TOTAL KNEE ARTHROPLASTY Left 02/06/2017   Procedure: LEFT TOTAL KNEE ARTHROPLASTY;  Surgeon: Kathryne Hitch, MD;  Location[facility] OR;  Service: Orthopedics;  Laterality: Left;   TOTAL KNEE ARTHROPLASTY Right 08/21/2017   TOTAL KNEE ARTHROPLASTY Right 08/21/2017   Procedure: RIGHT TOTAL KNEE ARTHROPLASTY;  Surgeon: Kathryne Hitch, MD;  Location: [facility] OR;  Service: Orthopedics;  Laterality: Right;    TUBAL LIGATION     Family History  Problem Relation Age of Onset   Hypertension Mother    Kidney disease Mother    Diabetes Father    Hypertension Father    Diabetes Sister    Hypertension Sister    Hypertension Sister    Obesity Sister    Diabetes Brother    Hypertension Brother    Heart disease Brother 2       heart attack   Diabetes Sister    Colon cancer Neg Hx    Esophageal cancer Neg Hx    Colon polyps Neg Hx    Pancreatic cancer Neg Hx    Rectal cancer Neg Hx    Stomach cancer Neg Hx    Social History   Socioeconomic History   Marital status: Single    Spouse name: Not on file   Number of children:  2   Years of education: Not on file   Highest education level: Associate degree: occupational, Scientist, product/process development, or vocational program  Occupational History   Occupation: UNEMPLOYED Theatre stage manager: UNEMPLOYED  Tobacco Use   Smoking status: Never   Smokeless tobacco: Never  Vaping Use   Vaping status: Never Used  Substance and Sexual Activity   Alcohol use: Not Currently    Comment: occ wine   Drug use: No   Sexual activity: Never  Other Topics Concern   Not on file  Social History Narrative   Admitted to [facility] 08/24/17   Divorced   Jehovah Witness    Never smoked   Alcohol none   Full Code   Right handed    Some caffeine in take    Social Drivers of Health   Financial Resource Strain: Low Risk  (03/04/2024)   Overall Financial Resource Strain (CARDIA)    Difficulty of Paying Living Expenses: Not hard at all  Food Insecurity: No Food Insecurity (03/04/2024)   Hunger Vital Sign    Worried About Running Out of Food in the Last Year: Never true    Ran Out of Food in the Last Year: Never true  Transportation Needs: No Transportation Needs (03/04/2024)   PRAPARE - Administrator, Civil Service (Medical): No    Lack of Transportation (Non-Medical): No  Physical Activity: Insufficiently Active (03/04/2024)   Exercise Vital Sign     Days of Exercise per Week: 7 days    Minutes of Exercise per Session: 10 min  Stress: No Stress Concern Present (03/04/2024)   [employer] of Occupational Health - Occupational Stress Questionnaire    Feeling of Stress : Not at all  Social Connections: Moderately Integrated (03/04/2024)   Social Connection and Isolation Panel [NHANES]    Frequency of Communication with Friends and Family: Three times a week    Frequency of Social Gatherings with Friends and Family: Twice a week    Attends Religious Services: More than 4 times per year    Active Member of [employer] or Organizations: Yes    Attends Engineer, structural: More than 4 times per year    Marital Status: Divorced    Tobacco Counseling Counseling given: Not Answered    Clinical Intake:  Pre-visit preparation completed: Yes  Pain : No/denies pain     Nutritional Risks: None Diabetes: Yes CBG done?: No Did pt. bring in CBG monitor from home?: No  How often do you need to have someone help you when you read instructions, pamphlets, or other written materials from your doctor or pharmacy?: 1 - Never  Interpreter Needed?: No  Information entered by :: NAllen LPN   Activities of Daily Living     03/03/2024   12:35 PM 01/24/2024   11:29 PM  In your present state of health, do you have any difficulty performing the following activities:  Hearing? 0 0  Vision? 0 0  Difficulty concentrating or making decisions? 0 0  Walking or climbing stairs? 0   Dressing or bathing? 0   Doing errands, shopping? 0 0  Preparing Food and eating ? N   Using the Toilet? N   In the past six months, have you accidently leaked urine? N   Do you have problems with loss of bowel control? N   Managing your Medications? N   Managing your Finances? N   Housekeeping or managing your Housekeeping? N  Patient Care Team: Tysinger, Cleda Mccreedy as PCP - General (Family Medicine) [facility]  Indicate any recent Medical Services you may have received from other than [facility] providers in the past year (date may be approximate).     Assessment:   This is a routine wellness examination for Deloma.  Hearing/Vision screen Hearing Screening - Comments:: Denies hearing issues Vision Screening - Comments:: Regular eye exams, [facility]   Goals Addressed             This Visit's Progress    Patient Stated       03/04/2024, wants to lose more weight and wants sugar to go down       Depression Screen     03/04/2024   11:37 AM 08/14/2023    2:57 PM 02/27/2023   11:33 AM 08/28/2022    3:09 PM 02/20/2022    2:42 PM 02/16/2021    2:55 PM 06/30/2020    2:42 PM  PHQ 2/9 Scores  PHQ - 2 Score 0 0 0 0 0 0 0  PHQ- 9 Score 0          Fall Risk     03/03/2024   12:35 PM 08/14/2023    2:57 PM 03/13/2023   10:53 AM 02/27/2023   11:32 AM 08/28/2022    3:09 PM  Fall Risk   Falls in the past year? 0 0 1 1 0  Comment    stepped off curb   Number falls in past yr: 0 0 0 0 0  Injury with Fall? 0 0 0 0 0  Risk for fall due to : Medication side effect No Fall Risks History of fall(s) Medication side effect;Impaired mobility No Fall Risks  Follow up Falls prevention discussed;Falls evaluation completed Falls evaluation completed Falls evaluation completed Falls prevention discussed;Education provided;Falls evaluation completed Falls evaluation completed    MEDICARE RISK AT HOME:  Medicare Risk at Home Any stairs in or around the home?: (Patient-Rptd) No If so, are there any without handrails?: (Patient-Rptd) No Home free of loose throw rugs in walkways, pet beds, electrical cords, etc?: (Patient-Rptd) Yes Adequate lighting in your home to reduce risk of falls?: (Patient-Rptd) Yes Life alert?: (Patient-Rptd) Yes Use of a cane, walker or w/c?: (Patient-Rptd) Yes Grab bars in the bathroom?: (Patient-Rptd) Yes Shower chair or bench in shower?: (Patient-Rptd) Yes Elevated toilet seat or a  handicapped toilet?: (Patient-Rptd) No  TIMED UP AND GO:  Was the test performed?  No  Cognitive Function: 6CIT completed        03/04/2024   11:39 AM 02/27/2023   11:36 AM  6CIT Screen  What Year? 0 points 0 points  What month? 0 points 0 points  What time? 0 points 0 points  Count back from 20 0 points 0 points  Months in reverse 0 points 0 points  Repeat phrase 2 points 0 points  Total Score 2 points 0 points    Immunizations Immunization History  Administered Date(s) Administered   Fluad Quad(high Dose 65+) 09/10/2019, 10/06/2020, 08/24/2021, 08/28/2022   Fluad Trivalent(High Dose 65+) 08/14/2023   Influenza, High Dose Seasonal PF 10/28/2018   Influenza,inj,Quad PF,6+ Mos 09/21/2016, 10/03/2017   Moderna Sars-Covid-2 Vaccination 05/25/2020, 06/22/2020, 12/29/2020, 09/09/2021   PPD Test 02/09/2017   Pneumococcal Conjugate-13 10/28/2018   Pneumococcal Polysaccharide-23 08/01/2006, 01/01/2020   RSV,unspecified 11/14/2022   Td 12/21/2003   Tdap 09/29/2015   Unspecified SARS-COV-2 Vaccination 11/14/2022   Zoster Recombinant(Shingrix) 08/19/2021, 08/21/2023  Screening Tests Health Maintenance  Topic Date Due   COVID-19 Vaccine (6 - 2024-25 season) 08/19/2023   OPHTHALMOLOGY EXAM  12/13/2023   HEMOGLOBIN A1C  07/25/2024   Diabetic kidney evaluation - Urine ACR  08/13/2024   FOOT EXAM  08/13/2024   Diabetic kidney evaluation - eGFR measurement  02/28/2025   Medicare Annual Wellness (AWV)  03/04/2025   DTaP/Tdap/Td (3 - Td or Tdap) 09/28/2025   MAMMOGRAM  12/17/2025   Colonoscopy  01/26/2034   Pneumonia Vaccine 70+ Years old  Completed   INFLUENZA VACCINE  Completed   DEXA SCAN  Completed   Hepatitis C Screening  Completed   Zoster Vaccines- Shingrix  Completed   HPV VACCINES  Aged Out    Health Maintenance  Health Maintenance Due  Topic Date Due   COVID-19 Vaccine (6 - 2024-25 season) 08/19/2023   OPHTHALMOLOGY EXAM  12/13/2023   Health Maintenance  Items Addressed: Requested diabetic eye exam  Additional Screening:  Vision Screening: Recommended annual ophthalmology exams for early detection of glaucoma and other disorders of the eye.  Dental Screening: Recommended annual dental exams for proper oral hygiene  Community Resource Referral / Chronic Care Management: CRR required this visit?  No   CCM required this visit?  No     Plan:     I have personally reviewed and noted the following in the patient's chart:   Medical and social history Use of alcohol, tobacco or illicit drugs  Current medications and supplements including opioid prescriptions. Patient is not currently taking opioid prescriptions. Functional ability and status Nutritional status Physical activity Advanced directives List of other physicians Hospitalizations, surgeries, and ER visits in previous 12 months Vitals Screenings to include cognitive, depression, and falls Referrals and appointments  In addition, I have reviewed and discussed with patient certain preventive protocols, quality metrics, and best practice recommendations. A written personalized care plan for preventive services as well as general preventive health recommendations were provided to patient.     Barb Merino, LPN   03/26/8118   After Visit Summary: (MyChart) Due to this being a telephonic visit, the after visit summary with patients personalized plan was offered to patient via MyChart   Notes: Nothing significant to report at this time.

## 2024-03-06 ENCOUNTER — Other Ambulatory Visit (INDEPENDENT_AMBULATORY_CARE_PROVIDER_SITE_OTHER)

## 2024-03-06 ENCOUNTER — Other Ambulatory Visit: Payer: Self-pay

## 2024-03-06 ENCOUNTER — Encounter: Payer: Self-pay | Admitting: Hematology

## 2024-03-06 DIAGNOSIS — E669 Obesity, unspecified: Secondary | ICD-10-CM

## 2024-03-06 DIAGNOSIS — D508 Other iron deficiency anemias: Secondary | ICD-10-CM

## 2024-03-06 DIAGNOSIS — E1169 Type 2 diabetes mellitus with other specified complication: Secondary | ICD-10-CM

## 2024-03-06 NOTE — Progress Notes (Signed)
 HEMATOLOGY/ONCOLOGY CLINIC NOTE  Date of Service: 03/06/2024  Patient Care Team: Tysinger, Cleda Mccreedy as PCP - General (Family Medicine) [facility] [place], [place], [facility] (Pharmacist)  CHIEF COMPLAINTS/PURPOSE OF CONSULTATION:  Evaluation and management of anemia  HISTORY OF PRESENTING ILLNESS:   [name] is a wonderful Jehovah's Witness 73 y.o. female who has been referred to [place] by [name], PA-C for evaluation and management of anemia and hx of PE.   She presented to the ED on 01/24/2024 for rectal bleeding. Colonoscopy showed diverticulosis and internal hemorrhoids.   Patient was admitted in September 2024 for PE currently remains off anticoagulation due to risk of GI bleeding. Tolerating her IV iron well and notes no other acute new symptoms.  No active overt GI bleeding at this time.Marland Kitchen    MEDICAL HISTORY:  Past Medical History:  Diagnosis Date   Abdominal hernia    Acute blood loss as cause of postoperative anemia 02/16/2017   Allergy    Arthritis    knees, back, R ankle    Bilateral carpal tunnel syndrome 04/29/2012   Diabetes mellitus    Diabetes mellitus type 2 in obese 12/31/2015   Edema    legs   GERD (gastroesophageal reflux disease)    HOH (hard of hearing)    Hypertension    Hypertriglyceridemia 08/30/2015   Started on medication 08/30/15   Lipoma 09/30/2012   Obesity    morbid obesity   OBESITY, MORBID 10/16/2007   Qualifier: Diagnosis of  By: Abby Potash     Osteoarthrosis involving lower leg 10/16/2007   Qualifier: Diagnosis of  By: Abby Potash     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   right   CARPAL  TUNNEL RELEASE Left 07/10/2014   Procedure: LEFT ENDOSCOPIC CARPAL TUNNEL RELEASE ;  Surgeon: Jodi Marble, MD;  Location: [facility];  Service: Orthopedics;  Laterality: Left;   CARPAL TUNNEL RELEASE Right    COLONOSCOPY     COLONOSCOPY WITH PROPOFOL N/A 01/27/2024   Procedure: COLONOSCOPY WITH PROPOFOL;  Surgeon: Ottie Glazier, MD;  Location: [facility];  Service: Gastroenterology;  Laterality: N/A;   HERNIA REPAIR  07/03/2011   abd hernia   JOINT REPLACEMENT     NOSE SURGERY  2008   sinus   TOTAL KNEE ARTHROPLASTY Left 02/06/2017   Procedure: LEFT TOTAL KNEE ARTHROPLASTY;  Surgeon: Kathryne Hitch, MD;  Location: [facility] OR;  Service: Orthopedics;  Laterality: Left;   TOTAL KNEE ARTHROPLASTY Right 08/21/2017   TOTAL KNEE ARTHROPLASTY Right 08/21/2017   Procedure: RIGHT TOTAL KNEE ARTHROPLASTY;  Surgeon: Kathryne Hitch, MD;  Location: [facility] OR;  Service: Orthopedics;  Laterality: Right;   TUBAL LIGATION      SOCIAL HISTORY: Social History   Socioeconomic History   Marital status: Single    Spouse name: Not on file   Number of children: 2   Years of education: Not on file   Highest education level: Associate degree: occupational, Scientist, product/process development, or vocational program  Occupational History   Occupation: UNEMPLOYED Theatre stage manager: UNEMPLOYED  Tobacco Use   Smoking status: Never   Smokeless tobacco: Never  Vaping  Use   Vaping status: Never Used  Substance and Sexual Activity   Alcohol use: Not Currently    Comment: occ wine   Drug use: No   Sexual activity: Never  Other Topics Concern   Not on file  Social History Narrative   Admitted to [facility]/24/[facility]   Divorced   Jehovah Witness    Never smoked   Alcohol none   Full Code   Right handed    Some caffeine in take    Social Drivers of Health   Financial Resource Strain: Low Risk  (03/04/2024)   Overall Financial Resource Strain (CARDIA)    Difficulty of Paying  Living Expenses: Not hard at all  Food Insecurity: No Food Insecurity (03/04/2024)   Hunger Vital Sign    Worried About Running Out of Food in the Last Year: Never true    Ran Out of Food in the Last Year: Never true  Transportation Needs: No Transportation Needs (03/04/2024)   PRAPARE - Administrator, Civil Service (Medical): No    Lack of Transportation (Non-Medical): No  Physical Activity: Insufficiently Active (03/04/2024)   Exercise Vital Sign    Days of Exercise per Week: 7 days    Minutes of Exercise per Session: 10 min  Stress: No Stress Concern Present (03/04/2024)   [employer] of Occupational Health - Occupational Stress Questionnaire    Feeling of Stress : Not at all  Social Connections: Moderately Integrated (03/04/2024)   Social Connection and Isolation Panel [NHANES]    Frequency of Communication with Friends and Family: Three times a week    Frequency of Social Gatherings with Friends and Family: Twice a week    Attends Religious Services: More than 4 times per year    Active Member of [employer] or Organizations: Yes    Attends Engineer, structural: More than 4 times per year    Marital Status: Divorced  Intimate Partner Violence: Not At Risk (03/04/2024)   Humiliation, Afraid, Rape, and Kick questionnaire    Fear of Current or Ex-Partner: No    Emotionally Abused: No    Physically Abused: No    Sexually Abused: No    FAMILY HISTORY: Family History  Problem Relation Age of Onset   Hypertension Mother    Kidney disease Mother    Diabetes Father    Hypertension Father    Diabetes Sister    Hypertension Sister    Hypertension Sister    Obesity Sister    Diabetes Brother    Hypertension Brother    Heart disease Brother 88       heart attack   Diabetes Sister    Colon cancer Neg Hx    Esophageal cancer Neg Hx    Colon polyps Neg Hx    Pancreatic cancer Neg Hx    Rectal cancer Neg Hx    Stomach cancer Neg Hx     ALLERGIES:  is  allergic to other, crestor [rosuvastatin], and sulfa antibiotics.  MEDICATIONS:  Current Outpatient Medications  Medication Sig Dispense Refill   Accu-Chek Softclix Lancets lancets TEST 1 TO 2 TIMES DAILY 100 each 1   allopurinol (ZYLOPRIM) 100 MG tablet TAKE 1 TABLET BY MOUTH DAILY 90 tablet 3   Blood Glucose Monitoring Suppl (SOLUS V2 BLOOD GLUCOSE SYSTEM) DEVI Test 1-2 times a day Dx E11.9 1 each 0   Cyanocobalamin (VITAMIN B-12 PO) Take 1 tablet by mouth daily at 2 PM.  fenofibrate (TRICOR) 145 MG tablet TAKE 1 TABLET(145 MG) BY MOUTH DAILY 90 tablet 1   ferrous gluconate (FERGON) 240 (27 FE) MG tablet Take 1 tablet (240 mg total) by mouth 2 (two) times daily before a meal. (Patient not taking: Reported on 03/04/2024) 60 tablet 2   glucose blood (ACCU-CHEK GUIDE) test strip TEST 1 TO 2 TIMES DAILY 200 strip 1   losartan-hydrochlorothiazide (HYZAAR) 100-12.5 MG tablet TAKE 1 TABLET BY MOUTH DAILY 100 tablet 1   MAGNESIUM CITRATE PO Take 1 tablet by mouth daily as needed.     metFORMIN (GLUCOPHAGE) 1000 MG tablet Take 1 tablet (1,000 mg total) by mouth 2 (two) times daily with a meal. 180 tablet 2   methocarbamol (ROBAXIN) 500 MG tablet Take 1 tablet (500 mg total) by mouth every 8 (eight) hours as needed for muscle spasms. (Patient not taking: Reported on 01/31/2024) 15 tablet 0   Multiple Vitamin (MULTIVITAMIN) tablet Take 1 tablet by mouth daily.     tirzepatide (MOUNJARO) 10 MG/0.5ML Pen Inject 10 mg into the skin once a week. 6 mL 0   tirzepatide (MOUNJARO) 12.5 MG/0.5ML Pen Inject 12.5 mg into the skin once a week. (Patient not taking: Reported on 03/04/2024) 6 mL 0   No current facility-administered medications for this visit.    REVIEW OF SYSTEMS:    10 Point review of Systems was done is negative except as noted above.  PHYSICAL EXAMINATION: ECOG PERFORMANCE STATUS: 1 - Symptomatic but completely ambulatory  VSS NAD GENERAL:alert, in no acute distress and comfortable SKIN:  no acute rashes, no significant lesions EYES: conjunctiva are pink and non-injected, sclera anicteric OROPHARYNX: MMM, no exudates, no oropharyngeal erythema or ulceration NECK: supple, no JVD LYMPH:  no palpable lymphadenopathy in the cervical, axillary or inguinal regions LUNGS: clear to auscultation b/l with normal respiratory effort HEART: regular rate & rhythm ABDOMEN:  normoactive bowel sounds , non tender, not distended. Extremity: no pedal edema PSYCH: alert & oriented x 3 with fluent speech NEURO: no focal motor/sensory deficits   LABORATORY DATA:  I have reviewed the data as listed  Colonoscopy 01/27/2024:    .    Latest Ref Rng & Units 02/29/2024    1:21 PM 02/22/2024   10:45 AM 02/14/2024   11:07 AM  CBC  WBC 4.0 - 10.5 K/uL 5.9  6.6  5.8   Hemoglobin 12.0 - 15.0 g/dL 9.8  9.4  8.9   Hematocrit 36.0 - 46.0 % 33.2  32.2  30.1   Platelets 150 - 400 K/uL 328  345  388     .    Latest Ref Rng & Units 02/29/2024    1:21 PM 02/22/2024   10:45 AM 02/14/2024   11:07 AM  CMP  Glucose 70 - 99 mg/dL 657  846  962   BUN 8 - 23 mg/dL 30  30  25    Creatinine 0.44 - 1.00 mg/dL 9.52  8.41  3.24   Sodium 135 - 145 mmol/L 142  141  140   Potassium 3.5 - 5.1 mmol/L 4.0  4.1  4.2   Chloride 98 - 111 mmol/L 108  106  105   CO2 22 - 32 mmol/L 26  26  29    Calcium 8.9 - 10.3 mg/dL 9.5  9.3  9.7   Total Protein 6.5 - 8.1 g/dL 6.9  7.1  7.1   Total Bilirubin 0.0 - 1.2 mg/dL 0.2  0.3  0.3   Alkaline Phos 38 - 126  U/L 52  50  53   AST 15 - 41 U/L 11  10  9    ALT 0 - 44 U/L 12  11  11       RADIOGRAPHIC STUDIES: I have personally reviewed the radiological images as listed and agreed with the findings in the report. No results found.  ASSESSMENT & PLAN:  73 y.o. female of Jehovah's Witness faith with:  Anemia from blood loss and resulting iron deficiency Hx of pulmonary embolism 3. Jehova's witness. Patient notes she would not accept prbc transfusion under any  circumstances. PLAN:  GI bleeding noted. Labs from today were reviewed in detail with the patient and showed improvement in her hemoglobin to 9.8. She will continue and complete her IV Venofer 3 mg weekly x 4 doses as ordered. Retacrit if hemoglobin less than 9 Patient counseled to go to the emergency room if there is significant GI bleeding noted.  FOLLOW-UP: Labs today IV venofer 300mg  weekly x 4 doses Retacrit weekly x 4 Weekly labs RTC with Dr Candise Che in 4 weeks  The total time spent in the appointment was 20 minutes*.  All of the patient's questions were answered with apparent satisfaction. The patient knows to call the clinic with any problems, questions or concerns.   Wyvonnia Lora MD MS AAHIVMS [facility] Hematology/Oncology Physician [facility]  .*Total Encounter Time as defined by the Centers for Medicare and Medicaid Services includes, in addition to the face-to-face time of a patient visit (documented in the note above) non-face-to-face time: obtaining and reviewing outside history, ordering and reviewing medications, tests or procedures, care coordination (communications with other health care professionals or caregivers) and documentation in the medical record.

## 2024-03-06 NOTE — Progress Notes (Signed)
 03/06/2024 Name: [name] MRN: [id number] DOB: 1951-01-28  Chief Complaint  Patient presents with   Medication Management   Diabetes    [name] is a 73 y.o. year old female who presented for a telephone visit.   They were referred to the pharmacist for assistance in managing diabetes.    Subjective:  Care Team: Primary Care Provider: Genia Del ; Next Scheduled Visit: 08/19/24  Medication Access/Adherence  Current Pharmacy:  [id number] [place], [place] - [address] AT [facility] OF [address] [place] [address] Phone: [phone] Fax: [phone]  [facility]) - [place], [place] - [address] [facility] [address] [place] [address] [place] [address] Phone: [phone] Fax: [phone]  [facility] - [place], [place] - [address] [place] [address] Phone: [phone] Fax: [phone]  [facility] [address] [place] [address] Phone: [phone] Fax: [phone]   Patient reports affordability concerns with their medications: No  Patient reports access/transportation concerns to their pharmacy: No  Patient reports adherence concerns with their medications:  No     Diabetes:  Current medications: Mounjaro 10mg  on Thursdays, Metformin 1000mg  BID Medications tried in the past: Glimepiride, Tradjenta, Ozempic  Current glucose readings: 182 this morning, patient says she sees some a little over 200 in the morning. Patient reports these readings as "fasting" but with further discussion, she normally checks after her morning coffee that contains creamer with sugar  Patient denies hypoglycemic s/sx including dizziness, shakiness, sweating. Patient denies hyperglycemic symptoms including polyuria, polydipsia, polyphagia, nocturia, neuropathy, blurred  vision.  Current meal patterns:  States mounjaro is really helping with appetite and food cravings and that she has worked hard to limit her carbs  Current physical activity: tries to exercise a little each day most days  Current medication access support: None   Objective:  Lab Results  Component Value Date   HGBA1C 8.1 (H) 01/26/2024    Lab Results  Component Value Date   CREATININE 1.34 (H) 02/29/2024   BUN 30 (H) 02/29/2024   NA 142 02/29/2024   K 4.0 02/29/2024   CL 108 02/29/2024   CO2 26 02/29/2024    Lab Results  Component Value Date   CHOL 158 08/14/2023   HDL 43 08/14/2023   LDLCALC 96 08/14/2023   TRIG 101 08/14/2023   CHOLHDL 3.7 08/14/2023    Medications Reviewed Today     Reviewed by Sherrill Raring, RPH (Pharmacist) on 03/06/24 at 1508  Med List Status: <None>   Medication Order Taking? Sig Documenting Provider Last Dose Status Informant  Accu-Chek Softclix Lancets lancets [id number]  TEST 1 TO 2 TIMES DAILY Avanell Shackleton, NP-C  Active Self, Pharmacy Records  allopurinol (ZYLOPRIM) 100 MG tablet [id number]  TAKE 1 TABLET BY MOUTH DAILY Tysinger, Kermit Balo, PA-C  Active Self, Pharmacy Records  Blood Glucose Monitoring Suppl (SOLUS V2 BLOOD GLUCOSE SYSTEM) DEVI [id number]  Test 1-2 times a day Dx E11.9 Avanell Shackleton, NP-C  Active Self, Pharmacy Records  Cyanocobalamin (VITAMIN B-12 PO) [id number]  Take 1 tablet by mouth daily at 2 PM. [provider]  Active Self, Pharmacy Records  fenofibrate (TRICOR) 145 MG tablet [id number]  TAKE 1 TABLET(145 MG) BY MOUTH DAILY Tysinger, Kermit Balo, PA-C  Active Self, Pharmacy Records  ferrous gluconate (  FERGON) 240 (27 FE) MG tablet [id number]  Take 1 tablet (240 mg total) by mouth 2 (two) times daily before a meal.  Patient not taking: Reported on 03/04/2024   Joycelyn Das, MD  Active   glucose blood (ACCU-CHEK GUIDE) test strip [id number]  TEST 1 TO 2 TIMES DAILY Ronnald Nian, MD  Active Self, Pharmacy  Records  losartan-hydrochlorothiazide [facility]) 100-12.5 MG tablet [id number]  TAKE 1 TABLET BY MOUTH DAILY Tysinger, Kermit Balo, PA-C  Active Self, Pharmacy Records  MAGNESIUM CITRATE PO [id number]  Take 1 tablet by mouth daily as needed. [provider]  Active Self, Pharmacy Records  metFORMIN (GLUCOPHAGE) 1000 MG tablet [id number] Yes Take 1 tablet (1,000 mg total) by mouth 2 (two) times daily with a meal. Tysinger, Kermit Balo, PA-C Taking Active Self, Pharmacy Records  methocarbamol (ROBAXIN) 500 MG tablet [id number]  Take 1 tablet (500 mg total) by mouth every 8 (eight) hours as needed for muscle spasms.  Patient not taking: Reported on 01/31/2024   [name], PA-C  Active Self, Pharmacy Records  Multiple Vitamin (MULTIVITAMIN) tablet [id number]  Take 1 tablet by mouth daily. [provider]  Active Self, Pharmacy Records  tirzepatide [facility]) 10 MG/0.5ML Pen [id number] Yes Inject 10 mg into the skin once a week. Tysinger, Kermit Balo, PA-C Taking Active   tirzepatide [facility]) 12.5 MG/0.5ML Pen [id number]  Inject 12.5 mg into the skin once a week.  Patient not taking: Reported on 03/04/2024   [name], PA-C  Active               Assessment/Plan:   Diabetes: - Currently uncontrolled - Reviewed long term cardiovascular and renal outcomes of uncontrolled blood sugar - Reviewed goal A1c, goal fasting, and goal 2 hour post prandial glucose - Reviewed dietary modifications including low carb diet. Recommended sugar free creamer, patient declines -Patient aware to increase to [facility] 12.5mg  after 4 doses of 10mg  (due to start April 10th) - Patient denies personal or family history of multiple endocrine neoplasia type 2, medullary thyroid cancer; personal history of pancreatitis or gallbladder disease. - Recommend to check glucose daily at varying times. Counseled on what it means to obtain a truly fasting sugar and goals - Future consideration - if A1C/sugars remain  elevated on max Mounjaro, could consider addition of SGLT2    Follow Up Plan: 04/10/24  Sherrill Raring, PharmD Clinical Pharmacist [phone]

## 2024-03-07 ENCOUNTER — Inpatient Hospital Stay

## 2024-03-07 ENCOUNTER — Ambulatory Visit: Payer: 59

## 2024-03-07 ENCOUNTER — Other Ambulatory Visit: Payer: 59

## 2024-03-07 VITALS — BP 116/59 | HR 77 | Temp 97.8°F | Resp 18

## 2024-03-07 DIAGNOSIS — Z79899 Other long term (current) drug therapy: Secondary | ICD-10-CM | POA: Diagnosis not present

## 2024-03-07 DIAGNOSIS — D508 Other iron deficiency anemias: Secondary | ICD-10-CM

## 2024-03-07 DIAGNOSIS — D500 Iron deficiency anemia secondary to blood loss (chronic): Secondary | ICD-10-CM | POA: Diagnosis not present

## 2024-03-07 LAB — CBC WITH DIFFERENTIAL (CANCER CENTER ONLY)
Abs Immature Granulocytes: 0.02 10*3/uL (ref 0.00–0.07)
Basophils Absolute: 0.1 10*3/uL (ref 0.0–0.1)
Basophils Relative: 1 %
Eosinophils Absolute: 0.1 10*3/uL (ref 0.0–0.5)
Eosinophils Relative: 1 %
HCT: 34.3 % — ABNORMAL LOW (ref 36.0–46.0)
Hemoglobin: 10.3 g/dL — ABNORMAL LOW (ref 12.0–15.0)
Immature Granulocytes: 0 %
Lymphocytes Relative: 20 %
Lymphs Abs: 1.4 10*3/uL (ref 0.7–4.0)
MCH: 29.6 pg (ref 26.0–34.0)
MCHC: 30 g/dL (ref 30.0–36.0)
MCV: 98.6 fL (ref 80.0–100.0)
Monocytes Absolute: 0.3 10*3/uL (ref 0.1–1.0)
Monocytes Relative: 5 %
Neutro Abs: 5.2 10*3/uL (ref 1.7–7.7)
Neutrophils Relative %: 73 %
Platelet Count: 366 10*3/uL (ref 150–400)
RBC: 3.48 MIL/uL — ABNORMAL LOW (ref 3.87–5.11)
RDW: 15.4 % (ref 11.5–15.5)
WBC Count: 7.1 10*3/uL (ref 4.0–10.5)
nRBC: 0 % (ref 0.0–0.2)

## 2024-03-07 LAB — CMP (CANCER CENTER ONLY)
ALT: 13 U/L (ref 0–44)
AST: 10 U/L — ABNORMAL LOW (ref 15–41)
Albumin: 4.3 g/dL (ref 3.5–5.0)
Alkaline Phosphatase: 49 U/L (ref 38–126)
Anion gap: 10 (ref 5–15)
BUN: 27 mg/dL — ABNORMAL HIGH (ref 8–23)
CO2: 26 mmol/L (ref 22–32)
Calcium: 9.5 mg/dL (ref 8.9–10.3)
Chloride: 103 mmol/L (ref 98–111)
Creatinine: 1.3 mg/dL — ABNORMAL HIGH (ref 0.44–1.00)
GFR, Estimated: 44 mL/min — ABNORMAL LOW (ref >60)
Glucose, Bld: 174 mg/dL — ABNORMAL HIGH (ref 70–99)
Potassium: 4.1 mmol/L (ref 3.5–5.1)
Sodium: 139 mmol/L (ref 135–145)
Total Bilirubin: 0.3 mg/dL (ref 0.0–1.2)
Total Protein: 7.5 g/dL (ref 6.5–8.1)

## 2024-03-07 LAB — IRON AND IRON BINDING CAPACITY (CC-WL,HP ONLY)
Iron: 55 ug/dL (ref 28–170)
Saturation Ratios: 12 % (ref 10.4–31.8)
TIBC: 454 ug/dL — ABNORMAL HIGH (ref 250–450)
UIBC: 399 ug/dL (ref 148–442)

## 2024-03-07 LAB — VITAMIN B12: Vitamin B-12: 875 pg/mL (ref 180–914)

## 2024-03-07 LAB — FERRITIN: Ferritin: 100 ng/mL (ref 11–307)

## 2024-03-07 MED ORDER — SODIUM CHLORIDE 0.9 % IV SOLN: INTRAVENOUS | Status: DC

## 2024-03-07 MED ORDER — LORATADINE 10 MG PO TABS
10.0000 mg | ORAL_TABLET | Freq: Once | ORAL | Status: AC
  Administered 2024-03-07: 10 mg via ORAL
  Filled 2024-03-07: qty 1

## 2024-03-07 MED ORDER — ACETAMINOPHEN 325 MG PO TABS
650.0000 mg | ORAL_TABLET | Freq: Once | ORAL | Status: AC
  Administered 2024-03-07: 650 mg via ORAL
  Filled 2024-03-07: qty 2

## 2024-03-07 MED ORDER — SODIUM CHLORIDE 0.9 % IV SOLN
300.0000 mg | Freq: Once | INTRAVENOUS | Status: AC
  Administered 2024-03-07: 300.0065 mg via INTRAVENOUS
  Filled 2024-03-07: qty 300

## 2024-03-07 NOTE — Progress Notes (Signed)
 Patient tolerated iron infusion, observed for , patient denies any distress, vitals within normal limit.

## 2024-03-13 ENCOUNTER — Other Ambulatory Visit: Payer: Self-pay

## 2024-03-13 DIAGNOSIS — D508 Other iron deficiency anemias: Secondary | ICD-10-CM

## 2024-03-14 ENCOUNTER — Inpatient Hospital Stay

## 2024-03-14 ENCOUNTER — Other Ambulatory Visit: Payer: 59

## 2024-03-14 ENCOUNTER — Ambulatory Visit: Payer: 59

## 2024-03-14 VITALS — BP 127/59 | HR 77 | Temp 98.2°F | Resp 17 | Wt 302.1 lb

## 2024-03-14 DIAGNOSIS — Z79899 Other long term (current) drug therapy: Secondary | ICD-10-CM | POA: Diagnosis not present

## 2024-03-14 DIAGNOSIS — D508 Other iron deficiency anemias: Secondary | ICD-10-CM

## 2024-03-14 DIAGNOSIS — D500 Iron deficiency anemia secondary to blood loss (chronic): Secondary | ICD-10-CM | POA: Diagnosis not present

## 2024-03-14 LAB — CMP (CANCER CENTER ONLY)
ALT: 17 U/L (ref 0–44)
AST: 16 U/L (ref 15–41)
Albumin: 4.2 g/dL (ref 3.5–5.0)
Alkaline Phosphatase: 47 U/L (ref 38–126)
Anion gap: 6 (ref 5–15)
BUN: 20 mg/dL (ref 8–23)
CO2: 26 mmol/L (ref 22–32)
Calcium: 9.9 mg/dL (ref 8.9–10.3)
Chloride: 105 mmol/L (ref 98–111)
Creatinine: 1.1 mg/dL — ABNORMAL HIGH (ref 0.44–1.00)
GFR, Estimated: 54 mL/min — ABNORMAL LOW (ref >60)
Glucose, Bld: 104 mg/dL — ABNORMAL HIGH (ref 70–99)
Potassium: 4 mmol/L (ref 3.5–5.1)
Sodium: 137 mmol/L (ref 135–145)
Total Bilirubin: 0.3 mg/dL (ref 0.0–1.2)
Total Protein: 7.2 g/dL (ref 6.5–8.1)

## 2024-03-14 LAB — IRON AND IRON BINDING CAPACITY (CC-WL,HP ONLY)
Iron: 57 ug/dL (ref 28–170)
Saturation Ratios: 13 % (ref 10.4–31.8)
TIBC: 445 ug/dL (ref 250–450)
UIBC: 388 ug/dL (ref 148–442)

## 2024-03-14 LAB — CBC WITH DIFFERENTIAL (CANCER CENTER ONLY)
Abs Immature Granulocytes: 0.02 10*3/uL (ref 0.00–0.07)
Basophils Absolute: 0 10*3/uL (ref 0.0–0.1)
Basophils Relative: 1 %
Eosinophils Absolute: 0.1 10*3/uL (ref 0.0–0.5)
Eosinophils Relative: 1 %
HCT: 36.1 % (ref 36.0–46.0)
Hemoglobin: 10.5 g/dL — ABNORMAL LOW (ref 12.0–15.0)
Immature Granulocytes: 0 %
Lymphocytes Relative: 21 %
Lymphs Abs: 1.4 10*3/uL (ref 0.7–4.0)
MCH: 29.2 pg (ref 26.0–34.0)
MCHC: 29.1 g/dL — ABNORMAL LOW (ref 30.0–36.0)
MCV: 100.3 fL — ABNORMAL HIGH (ref 80.0–100.0)
Monocytes Absolute: 0.5 10*3/uL (ref 0.1–1.0)
Monocytes Relative: 7 %
Neutro Abs: 4.7 10*3/uL (ref 1.7–7.7)
Neutrophils Relative %: 70 %
Platelet Count: 359 10*3/uL (ref 150–400)
RBC: 3.6 MIL/uL — ABNORMAL LOW (ref 3.87–5.11)
RDW: 15.2 % (ref 11.5–15.5)
WBC Count: 6.7 10*3/uL (ref 4.0–10.5)
nRBC: 0 % (ref 0.0–0.2)

## 2024-03-14 MED ORDER — ACETAMINOPHEN 325 MG PO TABS
650.0000 mg | ORAL_TABLET | Freq: Once | ORAL | Status: AC
  Administered 2024-03-14: 650 mg via ORAL
  Filled 2024-03-14: qty 2

## 2024-03-14 MED ORDER — LORATADINE 10 MG PO TABS
10.0000 mg | ORAL_TABLET | Freq: Once | ORAL | Status: AC
  Administered 2024-03-14: 10 mg via ORAL
  Filled 2024-03-14: qty 1

## 2024-03-14 MED ORDER — SODIUM CHLORIDE 0.9 % IV SOLN
INTRAVENOUS | Status: DC
Start: 2024-03-14 — End: 2024-03-14

## 2024-03-14 MED ORDER — SODIUM CHLORIDE 0.9 % IV SOLN
300.0000 mg | Freq: Once | INTRAVENOUS | Status: AC
  Administered 2024-03-14: 300 mg via INTRAVENOUS
  Filled 2024-03-14: qty 300

## 2024-03-14 NOTE — Progress Notes (Signed)
Pt observed for 15 minutes post Venofer infusion. Pt tolerated Tx well w/out incident. VSS at discharge.  Ambulatory to lobby.

## 2024-03-14 NOTE — Patient Instructions (Signed)

## 2024-03-20 ENCOUNTER — Other Ambulatory Visit: Payer: Self-pay

## 2024-03-20 DIAGNOSIS — D508 Other iron deficiency anemias: Secondary | ICD-10-CM

## 2024-03-21 ENCOUNTER — Inpatient Hospital Stay: Attending: Hematology

## 2024-03-21 ENCOUNTER — Inpatient Hospital Stay

## 2024-03-21 ENCOUNTER — Other Ambulatory Visit: Payer: Self-pay

## 2024-03-21 VITALS — BP 130/66 | HR 81 | Temp 98.6°F | Resp 17

## 2024-03-21 DIAGNOSIS — D500 Iron deficiency anemia secondary to blood loss (chronic): Secondary | ICD-10-CM | POA: Insufficient documentation

## 2024-03-21 DIAGNOSIS — Z7722 Contact with and (suspected) exposure to environmental tobacco smoke (acute) (chronic): Secondary | ICD-10-CM | POA: Diagnosis not present

## 2024-03-21 DIAGNOSIS — Z79899 Other long term (current) drug therapy: Secondary | ICD-10-CM | POA: Insufficient documentation

## 2024-03-21 DIAGNOSIS — D508 Other iron deficiency anemias: Secondary | ICD-10-CM

## 2024-03-21 LAB — CBC WITH DIFFERENTIAL (CANCER CENTER ONLY)
Abs Immature Granulocytes: 0.01 10*3/uL (ref 0.00–0.07)
Basophils Absolute: 0 10*3/uL (ref 0.0–0.1)
Basophils Relative: 1 %
Eosinophils Absolute: 0.1 10*3/uL (ref 0.0–0.5)
Eosinophils Relative: 1 %
HCT: 37.2 % (ref 36.0–46.0)
Hemoglobin: 11.1 g/dL — ABNORMAL LOW (ref 12.0–15.0)
Immature Granulocytes: 0 %
Lymphocytes Relative: 21 %
Lymphs Abs: 1.3 10*3/uL (ref 0.7–4.0)
MCH: 29.9 pg (ref 26.0–34.0)
MCHC: 29.8 g/dL — ABNORMAL LOW (ref 30.0–36.0)
MCV: 100.3 fL — ABNORMAL HIGH (ref 80.0–100.0)
Monocytes Absolute: 0.4 10*3/uL (ref 0.1–1.0)
Monocytes Relative: 6 %
Neutro Abs: 4.5 10*3/uL (ref 1.7–7.7)
Neutrophils Relative %: 71 %
Platelet Count: 318 10*3/uL (ref 150–400)
RBC: 3.71 MIL/uL — ABNORMAL LOW (ref 3.87–5.11)
RDW: 15.1 % (ref 11.5–15.5)
WBC Count: 6.2 10*3/uL (ref 4.0–10.5)
nRBC: 0 % (ref 0.0–0.2)

## 2024-03-21 LAB — IRON AND IRON BINDING CAPACITY (CC-WL,HP ONLY)
Iron: 55 ug/dL (ref 28–170)
Saturation Ratios: 13 % (ref 10.4–31.8)
TIBC: 412 ug/dL (ref 250–450)
UIBC: 357 ug/dL (ref 148–442)

## 2024-03-21 LAB — CMP (CANCER CENTER ONLY)
ALT: 14 U/L (ref 0–44)
AST: 11 U/L — ABNORMAL LOW (ref 15–41)
Albumin: 4.2 g/dL (ref 3.5–5.0)
Alkaline Phosphatase: 54 U/L (ref 38–126)
Anion gap: 6 (ref 5–15)
BUN: 29 mg/dL — ABNORMAL HIGH (ref 8–23)
CO2: 29 mmol/L (ref 22–32)
Calcium: 9.5 mg/dL (ref 8.9–10.3)
Chloride: 105 mmol/L (ref 98–111)
Creatinine: 1.48 mg/dL — ABNORMAL HIGH (ref 0.44–1.00)
GFR, Estimated: 38 mL/min — ABNORMAL LOW (ref >60)
Glucose, Bld: 170 mg/dL — ABNORMAL HIGH (ref 70–99)
Potassium: 5.1 mmol/L (ref 3.5–5.1)
Sodium: 140 mmol/L (ref 135–145)
Total Bilirubin: 0.3 mg/dL (ref 0.0–1.2)
Total Protein: 7.3 g/dL (ref 6.5–8.1)

## 2024-03-21 LAB — FERRITIN: Ferritin: 169 ng/mL (ref 11–307)

## 2024-03-21 LAB — VITAMIN B12: Vitamin B-12: 902 pg/mL (ref 180–914)

## 2024-03-21 MED ORDER — EPOETIN ALFA-EPBX 10000 UNIT/ML IJ SOLN: 10000.0000 [IU] | Freq: Once | INTRAMUSCULAR | Status: DC

## 2024-03-21 NOTE — Progress Notes (Signed)
 Hemoglobin 11.1  Parameters met. Pt notified. No injection

## 2024-03-26 ENCOUNTER — Other Ambulatory Visit: Payer: Self-pay

## 2024-03-26 DIAGNOSIS — D508 Other iron deficiency anemias: Secondary | ICD-10-CM

## 2024-03-26 NOTE — Progress Notes (Signed)
 HEMATOLOGY/ONCOLOGY CLINIC NOTE  Date of Service: 03/27/2024  Patient Care Team: Tysinger, Newt Barefoot as PCP - General (Family Medicine) [facility] Herring, Daria Eddy, [facility] (Pharmacist)  CHIEF COMPLAINTS/PURPOSE OF CONSULTATION:  Evaluation and management of anemia  HISTORY OF PRESENTING ILLNESS:   [name] is a wonderful Jehovah's Witness 73 y.o. female who has been referred to us  by Claudene Crystal, PA-C for evaluation and management of anemia and hx of PE.   She was last seen by me on 02/29/2024 for iron deficiency anemia and she had no overt GI bleeding or other acute new symptoms at the time.   Today, she reports that she has been doing well overall since her last clinical visit.   Her iron deficiency which was triggered by blood loss has been addressed and she reports that she has completed all of her planned iron infusions. Patient reports that she was previously SOB prior to receiving iron infusions. She denies any SOB or chest pain at this time. She has been able to walk around without any significant SOB issues at this time.   Her previous GI bleeding was thought to be related to diverticulosis. Patient denies any GI bleeding, black stools, blood in the stools, or other source of bleeding at this time. She reports that she is not on blood thinners at this time.   She reports that she will see her Gastroenterologist on Monday, 03/31/2024.   Patient was noted to have a PE in September 2024 and was on anticoagulation for 5 months before having GI bleeding issues. She reports that she is unsure of the cause of her blood clot and denies any other previous hx of blood clots otherwise.   She reports a hx of bilateral knee replacements. Patient denies having any surgery within 2-4 weeks of having a blood clot.   Patient denies any concern for infection, new medications, increased periods of resting around the time of her blood clot. She does  note that she was SOB at that time. Her SOB worsened after a couple of days, causing her to present to the hospital.   Patient reports having chronic bronchitis on two occasions and endorsing a cough spell for 1 year. She reports having poor lungs and notes that she has not been able to pass her breathing test.   She reports that she does not smoke cigarettes. However, she does have second-hand smoke exposure from her neighbor, which she believes causes her to have lung issues.   Patient complains of a cough, which she attributes to seasonal allergies.  Patient does tend to have allergy issues annually and manages it with medication.   MEDICAL HISTORY:  Past Medical History:  Diagnosis Date   Abdominal hernia    Acute blood loss as cause of postoperative anemia 02/16/2017   Allergy    Arthritis    knees, back, R ankle    Bilateral carpal tunnel syndrome 04/29/2012   Diabetes mellitus    Diabetes mellitus type 2 in obese 12/31/2015   Edema    legs   GERD (gastroesophageal reflux disease)    HOH (hard of hearing)    Hypertension    Hypertriglyceridemia 08/30/2015   Started on medication 08/30/15   Lipoma 09/30/2012   Obesity    morbid obesity   OBESITY, MORBID 10/16/2007   Qualifier: Diagnosis of  By: Lori Rondo     Osteoarthrosis involving lower leg 10/16/2007   Qualifier: Diagnosis of  By: Lori Rondo     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   right   CARPAL TUNNEL RELEASE Left 07/10/2014   Procedure: LEFT ENDOSCOPIC CARPAL TUNNEL RELEASE ;  Surgeon: Sheryl Donna, MD;  Location: [facility];  Service: Orthopedics;  Laterality: Left;   CARPAL TUNNEL RELEASE Right     COLONOSCOPY     COLONOSCOPY WITH PROPOFOL N/A 01/27/2024   Procedure: COLONOSCOPY WITH PROPOFOL;  Surgeon: Truddie Furrow, MD;  Location: [facility] ENDOSCOPY;  Service: Gastroenterology;  Laterality: N/A;   HERNIA REPAIR  07/03/2011   abd hernia   JOINT REPLACEMENT     NOSE SURGERY  2008   sinus   TOTAL KNEE ARTHROPLASTY Left 02/06/2017   Procedure: LEFT TOTAL KNEE ARTHROPLASTY;  Surgeon: Arnie Lao, MD;  Location: [facility] OR;  Service: Orthopedics;  Laterality: Left;   TOTAL KNEE ARTHROPLASTY Right 08/21/2017   TOTAL KNEE ARTHROPLASTY Right 08/21/2017   Procedure: RIGHT TOTAL KNEE ARTHROPLASTY;  Surgeon: Arnie Lao, MD;  Location: [facility] OR;  Service: Orthopedics;  Laterality: Right;   TUBAL LIGATION      SOCIAL HISTORY: Social History   Socioeconomic History   Marital status: Single    Spouse name: Not on file   Number of children: 2   Years of education: Not on file   Highest education level: Associate degree: occupational, Scientist, product/process development, or vocational program  Occupational History   Occupation: UNEMPLOYED Theatre stage manager: UNEMPLOYED  Tobacco Use   Smoking status: Never   Smokeless tobacco: Never  Vaping Use   Vaping status: Never Used  Substance and Sexual Activity   Alcohol use: Not Currently    Comment: occ wine   Drug use: No   Sexual activity: Never  Other Topics Concern   Not on file  Social History Narrative   Admitted to [facility] 08/24/17   Divorced   Jehovah Witness    Never smoked   Alcohol none   Full Code   Right handed    Some caffeine in take    Social Drivers of Health   Financial Resource Strain: Low Risk  (03/04/2024)   Overall Financial Resource Strain (CARDIA)    Difficulty of Paying Living Expenses: Not hard at all  Food Insecurity: No Food Insecurity (03/04/2024)   Hunger Vital Sign    Worried About Running Out of Food in the Last Year: Never true    Ran Out of Food in the Last Year: Never true   Transportation Needs: No Transportation Needs (03/04/2024)   PRAPARE - Administrator, Civil Service (Medical): No    Lack of Transportation (Non-Medical): No  Physical Activity: Insufficiently Active (03/04/2024)   Exercise Vital Sign    Days of Exercise per Week: 7 days    Minutes of Exercise per Session: 10 min  Stress: No Stress Concern Present (03/04/2024)   [employer] of Occupational Health - Occupational Stress Questionnaire    Feeling of Stress : Not at all  Social Connections: Moderately Integrated (03/04/2024)   Social Connection and Isolation Panel [NHANES]    Frequency of Communication with Friends and Family: Three times a week    Frequency of  Social Gatherings with Friends and Family: Twice a week    Attends Religious Services: More than 4 times per year    Active Member of [employer] or Organizations: Yes    Attends Engineer, structural: More than 4 times per year    Marital Status: Divorced  Intimate Partner Violence: Not At Risk (03/04/2024)   Humiliation, Afraid, Rape, and Kick questionnaire    Fear of Current or Ex-Partner: No    Emotionally Abused: No    Physically Abused: No    Sexually Abused: No    FAMILY HISTORY: Family History  Problem Relation Age of Onset   Hypertension Mother    Kidney disease Mother    Diabetes Father    Hypertension Father    Diabetes Sister    Hypertension Sister    Hypertension Sister    Obesity Sister    Diabetes Brother    Hypertension Brother    Heart disease Brother 61       heart attack   Diabetes Sister    Colon cancer Neg Hx    Esophageal cancer Neg Hx    Colon polyps Neg Hx    Pancreatic cancer Neg Hx    Rectal cancer Neg Hx    Stomach cancer Neg Hx     ALLERGIES:  is allergic to other, crestor [rosuvastatin], and sulfa antibiotics.  MEDICATIONS:  Current Outpatient Medications  Medication Sig Dispense Refill   Accu-Chek Softclix Lancets lancets TEST 1 TO 2 TIMES DAILY 100 each 1    allopurinol (ZYLOPRIM) 100 MG tablet TAKE 1 TABLET BY MOUTH DAILY 90 tablet 3   Blood Glucose Monitoring Suppl (SOLUS V2 BLOOD GLUCOSE SYSTEM) DEVI Test 1-2 times a day Dx E11.9 1 each 0   Cyanocobalamin (VITAMIN B-12 PO) Take 1 tablet by mouth daily at 2 PM.     fenofibrate (TRICOR) 145 MG tablet TAKE 1 TABLET(145 MG) BY MOUTH DAILY 90 tablet 1   ferrous gluconate (FERGON) 240 (27 FE) MG tablet Take 1 tablet (240 mg total) by mouth 2 (two) times daily before a meal. (Patient not taking: Reported on 03/04/2024) 60 tablet 2   glucose blood (ACCU-CHEK GUIDE) test strip TEST 1 TO 2 TIMES DAILY 200 strip 1   losartan-hydrochlorothiazide (HYZAAR) 100-12.5 MG tablet TAKE 1 TABLET BY MOUTH DAILY 100 tablet 1   MAGNESIUM CITRATE PO Take 1 tablet by mouth daily as needed.     metFORMIN (GLUCOPHAGE) 1000 MG tablet Take 1 tablet (1,000 mg total) by mouth 2 (two) times daily with a meal. 180 tablet 2   methocarbamol (ROBAXIN) 500 MG tablet Take 1 tablet (500 mg total) by mouth every 8 (eight) hours as needed for muscle spasms. (Patient not taking: Reported on 01/31/2024) 15 tablet 0   Multiple Vitamin (MULTIVITAMIN) tablet Take 1 tablet by mouth daily.     tirzepatide (MOUNJARO) 10 MG/0.5ML Pen Inject 10 mg into the skin once a week. 6 mL 0   tirzepatide (MOUNJARO) 12.5 MG/0.5ML Pen Inject 12.5 mg into the skin once a week. (Patient not taking: Reported on 03/04/2024) 6 mL 0   No current facility-administered medications for this visit.    REVIEW OF SYSTEMS:    10 Point review of Systems was done is negative except as noted above.   PHYSICAL EXAMINATION: ECOG PERFORMANCE STATUS: 1 - Symptomatic but completely ambulatory .BP 125/75 (BP Location: Left Arm, Patient Position: Sitting)   Pulse (!) 101   Temp (!) 97.5 F (36.4 C) (Temporal)   Resp  16   Wt (!) 304 lb 5 oz (138 kg)   LMP  (LMP Unknown)   SpO2 (!) 16%   BMI 49.12 kg/m  GENERAL:alert, in no acute distress and comfortable SKIN: no acute  rashes, no significant lesions EYES: conjunctiva are pink and non-injected, sclera anicteric OROPHARYNX: MMM, no exudates, no oropharyngeal erythema or ulceration NECK: supple, no JVD LYMPH:  no palpable lymphadenopathy in the cervical, axillary or inguinal regions LUNGS: clear to auscultation b/l with normal respiratory effort HEART: regular rate & rhythm ABDOMEN:  normoactive bowel sounds , non tender, not distended. Extremity: no pedal edema PSYCH: alert & oriented x 3 with fluent speech NEURO: no focal motor/sensory deficits   LABORATORY DATA:  I have reviewed the data as listed  Colonoscopy 01/27/2024:    .    Latest Ref Rng & Units 03/27/2024   12:50 PM 03/21/2024   12:45 PM 03/14/2024    2:19 PM  CBC  WBC 4.0 - 10.5 K/uL 8.2  6.2  6.7   Hemoglobin 12.0 - 15.0 g/dL 62.9  52.8  41.3   Hematocrit 36.0 - 46.0 % 36.6  37.2  36.1   Platelets 150 - 400 K/uL 290  318  359     .    Latest Ref Rng & Units 03/27/2024   12:50 PM 03/21/2024   12:45 PM 03/14/2024    2:19 PM  CMP  Glucose 70 - 99 mg/dL 244  010  272   BUN 8 - 23 mg/dL 37  29  20   Creatinine 0.44 - 1.00 mg/dL 5.36  6.44  0.34   Sodium 135 - 145 mmol/L 140  140  137   Potassium 3.5 - 5.1 mmol/L 4.5  5.1  4.0   Chloride 98 - 111 mmol/L 106  105  105   CO2 22 - 32 mmol/L 29  29  26    Calcium 8.9 - 10.3 mg/dL 74.2  9.5  9.9   Total Protein 6.5 - 8.1 g/dL 7.5  7.3  7.2   Total Bilirubin 0.0 - 1.2 mg/dL 0.3  0.3  0.3   Alkaline Phos 38 - 126 U/L 67  54  47   AST 15 - 41 U/L 11  11  16    ALT 0 - 44 U/L 13  14  17     . Lab Results  Component Value Date   IRON 48 03/27/2024   TIBC 414 03/27/2024   IRONPCTSAT 12 03/27/2024   (Iron and TIBC)  Lab Results  Component Value Date   FERRITIN 105 03/27/2024     RADIOGRAPHIC STUDIES: I have personally reviewed the radiological images as listed and agreed with the findings in the report. No results found.  ASSESSMENT & PLAN:  73 y.o. female of Jehovah's Witness  faith with:  Anemia from blood loss and resulting iron deficiency Hx of pulmonary embolism 3. Jehova's witness. Patient notes she would not accept prbc transfusion under any circumstances.  PLAN:  -Discussed lab results on 03/27/24 in detail with patient. CBC improved, showed WBC of 8.2K, hemoglobin of 11.1, and platelets of 290K. -hgb improved from 8.9 g/dL one month ago to 59.5G/LO currently -WBC normal -platelets normal -iron labs ferritin 105 and iron saturation of 12% -discussed option of blood tests to determine whether she may have a genetic disorder which could be a risk factor for blood clots or an abnormal acquired antibody that could be a risk factor for blood clots. Discussed that her anticoagulation panel workup  results would have a bearing on recommendations in regards to whether or not there is a role for anticoagulation  -discussed that there is a 1/3 risk of recurrent diverticular GI bleeding while on blood thinners  -educated patient that if there is a temporary cause of a blood clot, then there may be a role for continuing anticoagulation for 6 months. If there is a perceived ongoing risk factor such as a genetic clotting disorder or abnormal antibody, then one may need to be on blood thinners long term. Discussed option of anticoagulation panel for further evaluation.  -patient is agreeable to receiving additional labs today  -will order repeat US  of the leg in 1 weak as outpatient -will plan for phone visit to discuss lab results -Answered all of patient's questions in detail  . Orders Placed This Encounter  Procedures   Antithrombin III    Standing Status:   Future    Number of Occurrences:   1    Expected Date:   03/27/2024    Expiration Date:   03/27/2025   Protein C activity    Standing Status:   Future    Number of Occurrences:   1    Expected Date:   03/27/2024    Expiration Date:   03/27/2025   Protein C, total    Standing Status:   Future    Number of  Occurrences:   1    Expected Date:   03/27/2024    Expiration Date:   03/27/2025   Protein S activity    Standing Status:   Future    Number of Occurrences:   1    Expected Date:   03/27/2024    Expiration Date:   03/27/2025   Protein S, total    Standing Status:   Future    Number of Occurrences:   1    Expected Date:   03/27/2024    Expiration Date:   03/27/2025   Lupus anticoagulant panel    Standing Status:   Future    Number of Occurrences:   1    Expected Date:   03/27/2024    Expiration Date:   03/27/2025   Beta-2-glycoprotein i abs, IgG/M/A    Standing Status:   Future    Number of Occurrences:   1    Expected Date:   03/27/2024    Expiration Date:   03/27/2025   Homocysteine, serum    Standing Status:   Future    Number of Occurrences:   1    Expected Date:   03/27/2024    Expiration Date:   03/27/2025   Factor 5 leiden    Standing Status:   Future    Number of Occurrences:   1    Expected Date:   03/27/2024    Expiration Date:   03/27/2025   Prothrombin gene mutation    Standing Status:   Future    Number of Occurrences:   1    Expected Date:   03/27/2024    Expiration Date:   03/27/2025   Cardiolipin antibodies, IgG, IgM, IgA    Standing Status:   Future    Number of Occurrences:   1    Expected Date:   03/27/2024    Expiration Date:   03/27/2025    FOLLOW-UP: Additional labs today US  lower extremities venous in 1 week Phone visit with Dr Salomon Cree in 3 weeks  The total time spent in the appointment was 30 minutes* .  All  of the patient's questions were answered with apparent satisfaction. The patient knows to call the clinic with any problems, questions or concerns.   Jacquelyn Matt MD MS AAHIVMS [facility] Hematology/Oncology Physician [facility]  .*Total Encounter Time as defined by the Centers for Medicare and Medicaid Services includes, in addition to the face-to-face time of a patient visit (documented in the note above) non-face-to-face time: obtaining  and reviewing outside history, ordering and reviewing medications, tests or procedures, care coordination (communications with other health care professionals or caregivers) and documentation in the medical record.    I,Mitra Faeizi,acting as a Neurosurgeon for Jacquelyn Matt, MD.,have documented all relevant documentation on the behalf of Jacquelyn Matt, MD,as directed by  Jacquelyn Matt, MD while in the presence of Jacquelyn Matt, MD.  .I have reviewed the above documentation for accuracy and completeness, and I agree with the above. .Elleni Mozingo Kishore Katriel Cutsforth MD

## 2024-03-27 ENCOUNTER — Inpatient Hospital Stay: Admitting: Hematology

## 2024-03-27 ENCOUNTER — Inpatient Hospital Stay

## 2024-03-27 VITALS — BP 125/75 | HR 101 | Temp 97.5°F | Resp 16 | Wt 304.3 lb

## 2024-03-27 DIAGNOSIS — Z7722 Contact with and (suspected) exposure to environmental tobacco smoke (acute) (chronic): Secondary | ICD-10-CM | POA: Diagnosis not present

## 2024-03-27 DIAGNOSIS — D500 Iron deficiency anemia secondary to blood loss (chronic): Secondary | ICD-10-CM | POA: Diagnosis not present

## 2024-03-27 DIAGNOSIS — D508 Other iron deficiency anemias: Secondary | ICD-10-CM

## 2024-03-27 DIAGNOSIS — I2699 Other pulmonary embolism without acute cor pulmonale: Secondary | ICD-10-CM | POA: Diagnosis not present

## 2024-03-27 DIAGNOSIS — Z79899 Other long term (current) drug therapy: Secondary | ICD-10-CM | POA: Diagnosis not present

## 2024-03-27 LAB — CMP (CANCER CENTER ONLY)
ALT: 13 U/L (ref 0–44)
AST: 11 U/L — ABNORMAL LOW (ref 15–41)
Albumin: 4.2 g/dL (ref 3.5–5.0)
Alkaline Phosphatase: 67 U/L (ref 38–126)
Anion gap: 5 (ref 5–15)
BUN: 37 mg/dL — ABNORMAL HIGH (ref 8–23)
CO2: 29 mmol/L (ref 22–32)
Calcium: 10.1 mg/dL (ref 8.9–10.3)
Chloride: 106 mmol/L (ref 98–111)
Creatinine: 1.64 mg/dL — ABNORMAL HIGH (ref 0.44–1.00)
GFR, Estimated: 33 mL/min — ABNORMAL LOW (ref >60)
Glucose, Bld: 252 mg/dL — ABNORMAL HIGH (ref 70–99)
Potassium: 4.5 mmol/L (ref 3.5–5.1)
Sodium: 140 mmol/L (ref 135–145)
Total Bilirubin: 0.3 mg/dL (ref 0.0–1.2)
Total Protein: 7.5 g/dL (ref 6.5–8.1)

## 2024-03-27 LAB — CBC WITH DIFFERENTIAL (CANCER CENTER ONLY)
Abs Immature Granulocytes: 0.01 10*3/uL (ref 0.00–0.07)
Basophils Absolute: 0 10*3/uL (ref 0.0–0.1)
Basophils Relative: 1 %
Eosinophils Absolute: 0 10*3/uL (ref 0.0–0.5)
Eosinophils Relative: 0 %
HCT: 36.6 % (ref 36.0–46.0)
Hemoglobin: 11.1 g/dL — ABNORMAL LOW (ref 12.0–15.0)
Immature Granulocytes: 0 %
Lymphocytes Relative: 11 %
Lymphs Abs: 0.9 10*3/uL (ref 0.7–4.0)
MCH: 30 pg (ref 26.0–34.0)
MCHC: 30.3 g/dL (ref 30.0–36.0)
MCV: 98.9 fL (ref 80.0–100.0)
Monocytes Absolute: 0.4 10*3/uL (ref 0.1–1.0)
Monocytes Relative: 5 %
Neutro Abs: 6.7 10*3/uL (ref 1.7–7.7)
Neutrophils Relative %: 83 %
Platelet Count: 290 10*3/uL (ref 150–400)
RBC: 3.7 MIL/uL — ABNORMAL LOW (ref 3.87–5.11)
RDW: 14.9 % (ref 11.5–15.5)
WBC Count: 8.2 10*3/uL (ref 4.0–10.5)
nRBC: 0 % (ref 0.0–0.2)

## 2024-03-27 LAB — IRON AND IRON BINDING CAPACITY (CC-WL,HP ONLY)
Iron: 48 ug/dL (ref 28–170)
Saturation Ratios: 12 % (ref 10.4–31.8)
TIBC: 414 ug/dL (ref 250–450)
UIBC: 366 ug/dL (ref 148–442)

## 2024-03-27 LAB — ANTITHROMBIN III: AntiThromb III Func: 113 % (ref 75–120)

## 2024-03-27 LAB — FERRITIN: Ferritin: 105 ng/mL (ref 11–307)

## 2024-03-28 ENCOUNTER — Inpatient Hospital Stay

## 2024-03-28 ENCOUNTER — Telehealth: Payer: Self-pay | Admitting: Hematology

## 2024-03-28 LAB — CARDIOLIPIN ANTIBODIES, IGG, IGM, IGA
Anticardiolipin IgA: <9 U/mL (ref 0–11)
Anticardiolipin IgG: <9 GPL U/mL (ref 0–14)
Anticardiolipin IgM: <9 [MPL'U]/mL (ref 0–12)

## 2024-03-28 LAB — LUPUS ANTICOAGULANT PANEL
DRVVT: 34.8 s (ref 0.0–47.0)
PTT Lupus Anticoagulant: 23.4 s (ref 0.0–43.5)

## 2024-03-28 LAB — PROTEIN S, TOTAL: Protein S Ag, Total: 90 % (ref 60–150)

## 2024-03-28 LAB — HOMOCYSTEINE: Homocysteine: 19.8 umol/L — ABNORMAL HIGH (ref 0.0–17.2)

## 2024-03-28 LAB — PROTEIN C ACTIVITY: Protein C Activity: 145 % (ref 73–180)

## 2024-03-28 LAB — PROTEIN S ACTIVITY: Protein S Activity: 84 % (ref 63–140)

## 2024-03-28 NOTE — Telephone Encounter (Signed)
 Spoke with patient confirming upcoming appointment

## 2024-03-29 LAB — PROTEIN C, TOTAL: Protein C, Total: 121 % (ref 60–150)

## 2024-03-29 LAB — BETA-2-GLYCOPROTEIN I ABS, IGG/M/A
Beta-2 Glyco I IgG: <9 GPI IgG units (ref 0–20)
Beta-2-Glycoprotein I IgA: <9 GPI IgA units (ref 0–25)
Beta-2-Glycoprotein I IgM: <9 GPI IgM units (ref 0–32)

## 2024-03-31 ENCOUNTER — Ambulatory Visit: Payer: 59 | Admitting: Pediatrics

## 2024-03-31 ENCOUNTER — Ambulatory Visit
Admission: RE | Admit: 2024-03-31 | Discharge: 2024-03-31 | Disposition: A | Discharge status: Home / Self Care | Payer: 59 | Source: Ambulatory Visit | Attending: Medical | Admitting: Medical

## 2024-03-31 DIAGNOSIS — Z780 Asymptomatic menopausal state: Secondary | ICD-10-CM

## 2024-03-31 DIAGNOSIS — N189 Chronic kidney disease, unspecified: Secondary | ICD-10-CM

## 2024-03-31 DIAGNOSIS — E790 Hyperuricemia without signs of inflammatory arthritis and tophaceous disease: Secondary | ICD-10-CM

## 2024-04-01 LAB — FACTOR 5 LEIDEN

## 2024-04-02 ENCOUNTER — Encounter: Payer: Self-pay | Admitting: Hematology

## 2024-04-02 LAB — PROTHROMBIN GENE MUTATION

## 2024-04-03 ENCOUNTER — Ambulatory Visit (HOSPITAL_COMMUNITY)

## 2024-04-03 ENCOUNTER — Other Ambulatory Visit: Payer: Self-pay

## 2024-04-03 DIAGNOSIS — D508 Other iron deficiency anemias: Secondary | ICD-10-CM

## 2024-04-03 DIAGNOSIS — I2699 Other pulmonary embolism without acute cor pulmonale: Secondary | ICD-10-CM

## 2024-04-04 ENCOUNTER — Inpatient Hospital Stay

## 2024-04-04 DIAGNOSIS — Z7722 Contact with and (suspected) exposure to environmental tobacco smoke (acute) (chronic): Secondary | ICD-10-CM | POA: Diagnosis not present

## 2024-04-04 DIAGNOSIS — Z79899 Other long term (current) drug therapy: Secondary | ICD-10-CM | POA: Diagnosis not present

## 2024-04-04 DIAGNOSIS — D508 Other iron deficiency anemias: Secondary | ICD-10-CM

## 2024-04-04 DIAGNOSIS — I2699 Other pulmonary embolism without acute cor pulmonale: Secondary | ICD-10-CM

## 2024-04-04 DIAGNOSIS — D500 Iron deficiency anemia secondary to blood loss (chronic): Secondary | ICD-10-CM | POA: Diagnosis not present

## 2024-04-04 LAB — CMP (CANCER CENTER ONLY)
ALT: 13 U/L (ref 0–44)
AST: 11 U/L — ABNORMAL LOW (ref 15–41)
Albumin: 4 g/dL (ref 3.5–5.0)
Alkaline Phosphatase: 53 U/L (ref 38–126)
Anion gap: 6 (ref 5–15)
BUN: 23 mg/dL (ref 8–23)
CO2: 27 mmol/L (ref 22–32)
Calcium: 9.4 mg/dL (ref 8.9–10.3)
Chloride: 105 mmol/L (ref 98–111)
Creatinine: 1.2 mg/dL — ABNORMAL HIGH (ref 0.44–1.00)
GFR, Estimated: 49 mL/min — ABNORMAL LOW (ref >60)
Glucose, Bld: 223 mg/dL — ABNORMAL HIGH (ref 70–99)
Potassium: 4.1 mmol/L (ref 3.5–5.1)
Sodium: 138 mmol/L (ref 135–145)
Total Bilirubin: 0.3 mg/dL (ref 0.0–1.2)
Total Protein: 7.2 g/dL (ref 6.5–8.1)

## 2024-04-04 LAB — CBC WITH DIFFERENTIAL (CANCER CENTER ONLY)
Abs Immature Granulocytes: 0.03 10*3/uL (ref 0.00–0.07)
Basophils Absolute: 0 10*3/uL (ref 0.0–0.1)
Basophils Relative: 0 %
Eosinophils Absolute: 0.1 10*3/uL (ref 0.0–0.5)
Eosinophils Relative: 1 %
HCT: 36.3 % (ref 36.0–46.0)
Hemoglobin: 11.1 g/dL — ABNORMAL LOW (ref 12.0–15.0)
Immature Granulocytes: 0 %
Lymphocytes Relative: 21 %
Lymphs Abs: 1.5 10*3/uL (ref 0.7–4.0)
MCH: 29.3 pg (ref 26.0–34.0)
MCHC: 30.6 g/dL (ref 30.0–36.0)
MCV: 95.8 fL (ref 80.0–100.0)
Monocytes Absolute: 0.3 10*3/uL (ref 0.1–1.0)
Monocytes Relative: 4 %
Neutro Abs: 5.2 10*3/uL (ref 1.7–7.7)
Neutrophils Relative %: 74 %
Platelet Count: 345 10*3/uL (ref 150–400)
RBC: 3.79 MIL/uL — ABNORMAL LOW (ref 3.87–5.11)
RDW: 14.3 % (ref 11.5–15.5)
WBC Count: 7.1 10*3/uL (ref 4.0–10.5)
nRBC: 0 % (ref 0.0–0.2)

## 2024-04-04 LAB — IRON AND IRON BINDING CAPACITY (CC-WL,HP ONLY)
Iron: 53 ug/dL (ref 28–170)
Saturation Ratios: 14 % (ref 10.4–31.8)
TIBC: 385 ug/dL (ref 250–450)
UIBC: 332 ug/dL (ref 148–442)

## 2024-04-04 LAB — FERRITIN: Ferritin: 124 ng/mL (ref 11–307)

## 2024-04-04 LAB — VITAMIN B12: Vitamin B-12: 1254 pg/mL — ABNORMAL HIGH (ref 180–914)

## 2024-04-04 NOTE — Progress Notes (Signed)
 Patient here for Retacrit  injection.  HGB. 11.1.  No injection needed today.  Printed lab results for patient and informed her no injection needed today

## 2024-04-06 NOTE — Progress Notes (Signed)
 I am happy to report that their bone density study is normal.  I do recommend they exercise regularly including aerobic and weight bearing exercise.   Weight-bearing physical activity and exercises that improve balance and posture can strengthen bones and reduce the chance of a fracture. The more active and fit you are as you age, the less likely you are to fall and break a bone.   Good nutrition. Eat a healthy diet and make certain that you're getting 1200mg  of calcium  daily in the diet from dairy (typically 4 servings) or supplements OTC.   Also they should be getting Vitamin D  through eating fish, seafood, getting sun exposure, and using either OTC Vitamin D  supplement such as 1000 IU daily   Lets plan to repeat the bone density study in 5 years.

## 2024-04-08 ENCOUNTER — Ambulatory Visit (HOSPITAL_COMMUNITY)
Admission: RE | Admit: 2024-04-08 | Discharge: 2024-04-08 | Disposition: A | Discharge status: Home / Self Care | Source: Ambulatory Visit | Attending: Hematology | Admitting: Hematology

## 2024-04-08 DIAGNOSIS — I2699 Other pulmonary embolism without acute cor pulmonale: Secondary | ICD-10-CM | POA: Diagnosis not present

## 2024-04-10 ENCOUNTER — Other Ambulatory Visit: Payer: Self-pay

## 2024-04-10 ENCOUNTER — Other Ambulatory Visit (INDEPENDENT_AMBULATORY_CARE_PROVIDER_SITE_OTHER)

## 2024-04-10 DIAGNOSIS — D508 Other iron deficiency anemias: Secondary | ICD-10-CM

## 2024-04-10 DIAGNOSIS — E669 Obesity, unspecified: Secondary | ICD-10-CM

## 2024-04-10 DIAGNOSIS — E1169 Type 2 diabetes mellitus with other specified complication: Secondary | ICD-10-CM

## 2024-04-10 NOTE — Progress Notes (Signed)
 04/10/2024 Name: [name] MRN: [id number] DOB: 14-Jan-1951  Chief Complaint  Patient presents with   Medication Management   Diabetes    [name] is a 73 y.o. year old female who presented for a telephone visit.   They were referred to the pharmacist for assistance in managing diabetes.    Subjective:  Care Team: Primary Care Provider: Garner Barnett ; Next Scheduled Visit: 08/19/24  Medication Access/Adherence  Current Pharmacy:  [id number] [name], [place] - [address] AT [facility] [address] [name] [place] [address] Phone: [phone] Fax: [phone]  [facility] - [place], [place] - [address] [facility] [address] [place] [address] [place] [address] Phone: [phone] Fax: [phone]  [facility] - [place], [place] - [address] [place] [address] Phone: [phone] Fax: [phone]  [facility] [address] [place] [address] Phone: [phone] Fax: [phone]   Patient reports affordability concerns with their medications: No  Patient reports access/transportation concerns to their pharmacy: No  Patient reports adherence concerns with their medications:  No     Diabetes:  Current medications: Mounjaro  10mg  on Thursdays, Metformin  1000mg  BID Medications tried in the past: Glimepiride , Tradjenta , Ozempic   Current glucose readings: Reports fasting sugars still in the 180s-200s  Patient denies hypoglycemic s/sx including dizziness, shakiness, sweating. Patient denies hyperglycemic symptoms including polyuria, polydipsia, polyphagia, nocturia, neuropathy, blurred vision.  Current meal patterns:  States mounjaro  is really helping with appetite and food cravings and that she has worked hard to limit her carbs  Current physical activity: tries  to exercise a little each day most days  Current medication access support: None   Objective:  Lab Results  Component Value Date   HGBA1C 8.1 (H) 01/26/2024    Lab Results  Component Value Date   CREATININE 1.20 (H) 04/04/2024   BUN 23 04/04/2024   NA 138 04/04/2024   K 4.1 04/04/2024   CL 105 04/04/2024   CO2 27 04/04/2024    Lab Results  Component Value Date   CHOL 158 08/14/2023   HDL 43 08/14/2023   LDLCALC 96 08/14/2023   TRIG 101 08/14/2023   CHOLHDL 3.7 08/14/2023    Medications Reviewed Today     Reviewed by [name], RPH (Pharmacist) on 04/10/24 at 1513  Med List Status: <None>   Medication Order Taking? Sig Documenting Provider Last Dose Status Informant  Accu-Chek Softclix Lancets lancets [id number] No TEST 1 TO 2 TIMES DAILY [name], Kaylee L, NP-C Taking Active Self, Pharmacy Records  allopurinol  (ZYLOPRIM ) 100 MG tablet [id number] No TAKE 1 TABLET BY MOUTH DAILY [name], Kaylee Cowing, PA-C Taking Active Self, Pharmacy Records  Blood Glucose Monitoring Suppl (SOLUS V2 BLOOD GLUCOSE SYSTEM) DEVI [id number] No Test 1-2 times a day Dx E11.9 Kaylee Abraham, NP-C Taking Active Self, Pharmacy Records  Cyanocobalamin  (VITAMIN B-12 PO) [id number] No Take 1 tablet by mouth daily at 2 PM. [provider] Taking Active Self, Pharmacy Records  fenofibrate  (TRICOR ) 145 MG tablet [id number] No TAKE 1 TABLET(145 MG) BY MOUTH DAILY [name], Kaylee Cowing, PA-C Taking Active Self, Pharmacy Records  ferrous gluconate  [facility]) 240 (27 FE) MG tablet [id number] No Take 1 tablet (240 mg total) by mouth 2 (two) times daily before a meal.  Patient not taking: Reported on 03/04/2024  Kaylee Conradi, MD Not Taking Active   glucose blood (ACCU-CHEK GUIDE) test strip [id number] No TEST 1 TO 2 TIMES DAILY Kaylee Hacking, MD Taking Active Self, Pharmacy Records  losartan -hydrochlorothiazide  [facility]) 100-12.5 MG tablet [id number] No TAKE 1 TABLET BY MOUTH DAILY [name], Kaylee Cowing, PA-C  Taking Active Self, Pharmacy Records  MAGNESIUM  CITRATE PO [id number] No Take 1 tablet by mouth daily as needed. [provider] Taking Active Self, Pharmacy Records  metFORMIN  (GLUCOPHAGE ) 1000 MG tablet [id number] No Take 1 tablet (1,000 mg total) by mouth 2 (two) times daily with a meal. [name], Kaylee Cowing, PA-C Taking Active Self, Pharmacy Records  methocarbamol  (ROBAXIN ) 500 MG tablet [id number] No Take 1 tablet (500 mg total) by mouth every 8 (eight) hours as needed for muscle spasms.  Patient not taking: Reported on 01/31/2024   Kaylee Crystal, PA-C Not Taking Active Self, Pharmacy Records  Multiple Vitamin (MULTIVITAMIN) tablet [id number] No Take 1 tablet by mouth daily. [provider] Taking Active Self, Pharmacy Records  tirzepatide  (MOUNJARO ) 10 MG/0.5ML Pen [id number] No Inject 10 mg into the skin once a week. [name], Kaylee Cowing, PA-C Taking Active   tirzepatide  (MOUNJARO ) 12.5 MG/0.5ML Pen [id number] No Inject 12.5 mg into the skin once a week.  Patient not taking: Reported on 03/04/2024   Kaylee Crystal, PA-C Not Taking Active               Assessment/Plan:   Diabetes: - Currently uncontrolled - Reviewed long term cardiovascular and renal outcomes of uncontrolled blood sugar - Reviewed goal A1c, goal fasting, and goal 2 hour post prandial glucose - Reviewed dietary modifications including low carb diet. Recommended sugar free creamer, patient declines -Patient aware to increase to Mounjaro  12.5mg  today - Patient denies personal or family history of multiple endocrine neoplasia type 2, medullary thyroid  cancer; personal history of pancreatitis or gallbladder disease. - Recommend to check glucose daily at varying times. Counseled on what it means to obtain a truly fasting sugar and goals - Future consideration - if A1C/sugars remain elevated on max Mounjaro , could consider addition of SGLT2    Follow Up Plan: 05/08/24  [name], PharmD Clinical  Pharmacist [phone]

## 2024-04-11 ENCOUNTER — Inpatient Hospital Stay

## 2024-04-11 DIAGNOSIS — Z79899 Other long term (current) drug therapy: Secondary | ICD-10-CM | POA: Diagnosis not present

## 2024-04-11 DIAGNOSIS — D508 Other iron deficiency anemias: Secondary | ICD-10-CM

## 2024-04-11 DIAGNOSIS — Z7722 Contact with and (suspected) exposure to environmental tobacco smoke (acute) (chronic): Secondary | ICD-10-CM | POA: Diagnosis not present

## 2024-04-11 DIAGNOSIS — D500 Iron deficiency anemia secondary to blood loss (chronic): Secondary | ICD-10-CM | POA: Diagnosis not present

## 2024-04-11 LAB — CBC WITH DIFFERENTIAL (CANCER CENTER ONLY)
Abs Immature Granulocytes: 0.01 10*3/uL (ref 0.00–0.07)
Basophils Absolute: 0 10*3/uL (ref 0.0–0.1)
Basophils Relative: 0 %
Eosinophils Absolute: 0.1 10*3/uL (ref 0.0–0.5)
Eosinophils Relative: 1 %
HCT: 36.7 % (ref 36.0–46.0)
Hemoglobin: 11.4 g/dL — ABNORMAL LOW (ref 12.0–15.0)
Immature Granulocytes: 0 %
Lymphocytes Relative: 21 %
Lymphs Abs: 1.4 10*3/uL (ref 0.7–4.0)
MCH: 29.6 pg (ref 26.0–34.0)
MCHC: 31.1 g/dL (ref 30.0–36.0)
MCV: 95.3 fL (ref 80.0–100.0)
Monocytes Absolute: 0.5 10*3/uL (ref 0.1–1.0)
Monocytes Relative: 7 %
Neutro Abs: 4.9 10*3/uL (ref 1.7–7.7)
Neutrophils Relative %: 71 %
Platelet Count: 371 10*3/uL (ref 150–400)
RBC: 3.85 MIL/uL — ABNORMAL LOW (ref 3.87–5.11)
RDW: 14.5 % (ref 11.5–15.5)
WBC Count: 6.9 10*3/uL (ref 4.0–10.5)
nRBC: 0 % (ref 0.0–0.2)

## 2024-04-11 LAB — CMP (CANCER CENTER ONLY)
ALT: 13 U/L (ref 0–44)
AST: 12 U/L — ABNORMAL LOW (ref 15–41)
Albumin: 4.2 g/dL (ref 3.5–5.0)
Alkaline Phosphatase: 52 U/L (ref 38–126)
Anion gap: 7 (ref 5–15)
BUN: 22 mg/dL (ref 8–23)
CO2: 28 mmol/L (ref 22–32)
Calcium: 9.3 mg/dL (ref 8.9–10.3)
Chloride: 105 mmol/L (ref 98–111)
Creatinine: 1.33 mg/dL — ABNORMAL HIGH (ref 0.44–1.00)
GFR, Estimated: 43 mL/min — ABNORMAL LOW (ref >60)
Glucose, Bld: 186 mg/dL — ABNORMAL HIGH (ref 70–99)
Potassium: 4.3 mmol/L (ref 3.5–5.1)
Sodium: 140 mmol/L (ref 135–145)
Total Bilirubin: 0.3 mg/dL (ref 0.0–1.2)
Total Protein: 7.3 g/dL (ref 6.5–8.1)

## 2024-04-11 NOTE — Progress Notes (Signed)
 Injection held hemoglobin 11.4 per parameters. Pt informed.

## 2024-04-16 NOTE — Progress Notes (Signed)
 HEMATOLOGY/ONCOLOGY PHONE VISIT NOTE  Date of Service: 04/18/2024  Patient Care Team: Tysinger, Newt Barefoot as PCP - General (Family Medicine) [facility] Herring, Daria Eddy, [place] (Pharmacist) Frankie Israel, MD as Consulting Physician (Hematology)  CHIEF COMPLAINTS/PURPOSE OF CONSULTATION:  Evaluation and management of anemia  HISTORY OF PRESENTING ILLNESS:   [name] is a wonderful Jehovah's Witness 73 y.o. female who has been referred to us  by Kaylee Crystal, PA-C for evaluation and management of anemia and hx of PE.    She presented to the ED on 01/24/2024 for rectal bleeding. Colonoscopy showed diverticulosis and internal hemorrhoids.    Patient was admitted in September 2024 for PE.     Today, she reports that she received two units of IV iron  infusions while she was recently hospitalized as well as an injection and oral iron . Patient has not received IV iron  since being discharged from the hospital and has not yet been set up for additional IV iron . Patient has been taking two tablets of oral iron  daily since the day after being discharged.    Patient does endorse dark stools from oral iron . Her oral iron  does cause constipation, which is managed with stool softener. She does take a laxative on a regular basis for constipation management.  She denies any blood in her stools since her discharge from the hospital.    She reports that her rectal bleeding episode occurred while being on blood thinners, and she has been off of blood thinners since.    She denies any hx of GI bleeding prior to her most recent episode. Patient notes that multiple siblings do have diverticulitis.   She reports that a cause for her blood clot in September had not been determined. Patient denies any hx of blood clots in the past prior to her blood clot in September.    Patient reports that her son did have a blood clot in the knee which traveled to his lung,  which was provoked from knee replacement surgery.    Patient denies any other known inherited clotting disorder in the family. She has not been tested for an inherited clotting disorder.   Patient reports a hx of chronic bronchitis managed with an inhaler. She reports that bronchitis did not cause increased immobility at that time.    Patient reports that there was a period prior to her admission in September 2024 in which she endorsed breathing issues and pain with deep breaths.    She denies having a COVID-19 infection, long distance travel, or surgeries at the time of her blood clot. She denies having any leg trauma and denies having any pain or swelling in the leg at that time.   Patient has a hx of bilateral knee replacements, which were not done around the time of her blood clot.    She reports that she is UTD with her cancer screenings and her last mammogram showed no concerns.    Patient does use Mounjaro  and reports that she has lost about 30s pounds over 7 months.    Patient is a never smoker but does report second-hand smoke exposure growing up.    She complains of lower back pain limiting her walking ability. She was seen by a spine specialist previously and it was thought that her back pain was due to arthritis. Patient did previously receive injections which did not improve her symptoms. She reports that there is no need for surgical intervention.  Patient reports that she is unable to engage in physical therapy due to back pain. She is able to engage in stationary cycling. Patient reports that she uses a walker when walking to her car and is able to move around stores by stabilizing herself with shopping carts.    Patient has used a back brace which has not been very helpful. Her PCP recommended that she manage pain with Tylenol .    She reports intermittent SOB attributed to feeling anxious. She denies any abdominal pain, but does report gassiness. Patient denies any  lightheadedness, dizziness, or other new concerns. She has no leg swelling at this time.    She reports that her HTN has been stable. However, in regards to her DM, her blood glucose levels have been elevated recently.   She reports having chest pain last week, which has improved.    Her PCP is Dr. Azalea Lento.   INTERVAL HISTORY:  Damaria Navalta Barnett is a 73 y.o. female who is being connected with via telemedicine visit for continued evaluation and management of anemia.  She was last seen by me in clinic on 03/27/2024 and reported cough, seasonal allergy  issues, and chronic bronchitis. She reported SOB which had improved.   I connected with [name] on 04/18/2024 at  8:40 AM EDT by telephone visit and verified that I am speaking with the correct person using two identifiers.   I discussed the limitations, risks, security and privacy concerns of performing an evaluation and management service by telemedicine and the availability of in-person appointments. I also discussed with the patient that there may be a patient responsible charge related to this service. The patient expressed understanding and agreed to proceed.   Other persons participating in the visit and their role in the encounter: none   Patient's location: home  Provider's location: [facility]   Chief Complaint: anemia     The results of her recent lab workup was discussed with her in detail.    MEDICAL HISTORY:  Past Medical History:  Diagnosis Date   Abdominal hernia    Acute blood loss as cause of postoperative anemia 02/16/2017   Allergy     Arthritis    knees, back, R ankle    Bilateral carpal tunnel syndrome 04/29/2012   Diabetes mellitus    Diabetes mellitus type 2 in obese 12/31/2015   Edema    legs   GERD (gastroesophageal reflux disease)    HOH (hard of hearing)    Hypertension    Hypertriglyceridemia 08/30/2015   Started on medication 08/30/15   Lipoma 09/30/2012   Obesity    morbid obesity   OBESITY,  MORBID 10/16/2007   Qualifier: Diagnosis of  By: Lori Rondo     Osteoarthrosis involving lower leg 10/16/2007   Qualifier: Diagnosis of  By: Lori Rondo     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   right   CARPAL TUNNEL RELEASE Left 07/10/2014   Procedure: LEFT ENDOSCOPIC CARPAL TUNNEL RELEASE ;  Surgeon: Sheryl Donna, MD;  Location: [facility];  Service: Orthopedics;  Laterality: Left;   CARPAL TUNNEL RELEASE Right    COLONOSCOPY     COLONOSCOPY WITH PROPOFOL  N/A 01/27/2024   Procedure: COLONOSCOPY  WITH PROPOFOL ;  Surgeon: Truddie Furrow, MD;  Location: [facility] ENDOSCOPY;  Service: Gastroenterology;  Laterality: N/A;   HERNIA REPAIR  07/03/2011   abd hernia   JOINT REPLACEMENT     NOSE SURGERY  2008   sinus   TOTAL KNEE ARTHROPLASTY Left 02/06/2017   Procedure: LEFT TOTAL KNEE ARTHROPLASTY;  Surgeon: Arnie Lao, MD;  Location: [facility] OR;  Service: Orthopedics;  Laterality: Left;   TOTAL KNEE ARTHROPLASTY Right 08/21/2017   TOTAL KNEE ARTHROPLASTY Right 08/21/2017   Procedure: RIGHT TOTAL KNEE ARTHROPLASTY;  Surgeon: Arnie Lao, MD;  Location: [facility] OR;  Service: Orthopedics;  Laterality: Right;   TUBAL LIGATION      SOCIAL HISTORY: Social History   Socioeconomic History   Marital status: Single    Spouse name: Not on file   Number of children: 2   Years of education: Not on file   Highest education level: Associate degree: occupational, Scientist, product/process development, or vocational program  Occupational History   Occupation: UNEMPLOYED Theatre stage manager: UNEMPLOYED  Tobacco Use   Smoking status: Never   Smokeless tobacco: Never  Vaping Use   Vaping status: Never  Used  Substance and Sexual Activity   Alcohol use: Not Currently    Comment: occ wine   Drug use: No   Sexual activity: Never  Other Topics Concern   Not on file  Social History Narrative   Admitted to [facility] 08/24/17   Divorced   Jehovah Witness    Never smoked   Alcohol none   Full Code   Right handed    Some caffeine in take    Social Drivers of Health   Financial Resource Strain: Low Risk  (03/04/2024)   Overall Financial Resource Strain (CARDIA)    Difficulty of Paying Living Expenses: Not hard at all  Food Insecurity: No Food Insecurity (03/04/2024)   Hunger Vital Sign    Worried About Running Out of Food in the Last Year: Never true    Ran Out of Food in the Last Year: Never true  Transportation Needs: No Transportation Needs (03/04/2024)   PRAPARE - Administrator, Civil Service (Medical): No    Lack of Transportation (Non-Medical): No  Physical Activity: Insufficiently Active (03/04/2024)   Exercise Vital Sign    Days of Exercise per Week: 7 days    Minutes of Exercise per Session: 10 min  Stress: No Stress Concern Present (03/04/2024)   [employer] of Occupational Health - Occupational Stress Questionnaire    Feeling of Stress : Not at all  Social Connections: Moderately Integrated (03/04/2024)   Social Connection and Isolation Panel [NHANES]    Frequency of Communication with Friends and Family: Three times a week    Frequency of Social Gatherings with Friends and Family: Twice a week    Attends Religious Services: More than 4 times per year    Active Member of [employer] or Organizations: Yes    Attends Engineer, structural: More than 4 times per year    Marital Status: Divorced  Intimate Partner Violence: Not At Risk (03/04/2024)   Humiliation, Afraid, Rape, and Kick questionnaire    Fear of Current or Ex-Partner: No    Emotionally Abused: No    Physically Abused: No    Sexually Abused: No    FAMILY HISTORY: Family  History  Problem Relation Age of Onset   Hypertension Mother    Kidney disease Mother    Diabetes  Father    Hypertension Father    Diabetes Sister    Hypertension Sister    Hypertension Sister    Obesity Sister    Diabetes Brother    Hypertension Brother    Heart disease Brother 6       heart attack   Diabetes Sister    Colon cancer Neg Hx    Esophageal cancer Neg Hx    Colon polyps Neg Hx    Pancreatic cancer Neg Hx    Rectal cancer Neg Hx    Stomach cancer Neg Hx     ALLERGIES:  is allergic to other, crestor [rosuvastatin], and sulfa antibiotics.  MEDICATIONS:  Current Outpatient Medications  Medication Sig Dispense Refill   Accu-Chek Softclix Lancets lancets TEST 1 TO 2 TIMES DAILY 100 each 1   allopurinol  (ZYLOPRIM ) 100 MG tablet TAKE 1 TABLET BY MOUTH DAILY 90 tablet 3   Blood Glucose Monitoring Suppl (SOLUS V2 BLOOD GLUCOSE SYSTEM) DEVI Test 1-2 times a day Dx E11.9 1 each 0   Cyanocobalamin  (VITAMIN B-12 PO) Take 1 tablet by mouth daily at 2 PM.     fenofibrate  (TRICOR ) 145 MG tablet TAKE 1 TABLET(145 MG) BY MOUTH DAILY 90 tablet 1   ferrous gluconate  (FERGON) 240 (27 FE) MG tablet Take 1 tablet (240 mg total) by mouth 2 (two) times daily before a meal. (Patient not taking: Reported on 03/04/2024) 60 tablet 2   glucose blood (ACCU-CHEK GUIDE) test strip TEST 1 TO 2 TIMES DAILY 200 strip 1   losartan -hydrochlorothiazide  (HYZAAR) 100-12.5 MG tablet TAKE 1 TABLET BY MOUTH DAILY 100 tablet 1   MAGNESIUM  CITRATE PO Take 1 tablet by mouth daily as needed.     metFORMIN  (GLUCOPHAGE ) 1000 MG tablet Take 1 tablet (1,000 mg total) by mouth 2 (two) times daily with a meal. 180 tablet 2   methocarbamol  (ROBAXIN ) 500 MG tablet Take 1 tablet (500 mg total) by mouth every 8 (eight) hours as needed for muscle spasms. (Patient not taking: Reported on 01/31/2024) 15 tablet 0   Multiple Vitamin (MULTIVITAMIN) tablet Take 1 tablet by mouth daily.     tirzepatide  (MOUNJARO ) 10 MG/0.5ML Pen  Inject 10 mg into the skin once a week. 6 mL 0   tirzepatide  (MOUNJARO ) 12.5 MG/0.5ML Pen Inject 12.5 mg into the skin once a week. (Patient not taking: Reported on 03/04/2024) 6 mL 0   No current facility-administered medications for this visit.    REVIEW OF SYSTEMS:    10 Point review of Systems was done is negative except as noted above.   PHYSICAL EXAMINATION: TELEMEDICINE VISIT  LABORATORY DATA:  I have reviewed the data as listed  Colonoscopy 01/27/2024:    .    Latest Ref Rng & Units 04/11/2024    3:01 PM 04/04/2024   12:48 PM 03/27/2024   12:50 PM  CBC  WBC 4.0 - 10.5 K/uL 6.9  7.1  8.2   Hemoglobin 12.0 - 15.0 g/dL 28.4  13.2  44.0   Hematocrit 36.0 - 46.0 % 36.7  36.3  36.6   Platelets 150 - 400 K/uL 371  345  290     .    Latest Ref Rng & Units 04/11/2024    3:01 PM 04/04/2024   12:48 PM 03/27/2024   12:50 PM  CMP  Glucose 70 - 99 mg/dL 102  725  366   BUN 8 - 23 mg/dL 22  23  37   Creatinine 0.44 - 1.00 mg/dL 4.40  1.20  1.64   Sodium 135 - 145 mmol/L 140  138  140   Potassium 3.5 - 5.1 mmol/L 4.3  4.1  4.5   Chloride 98 - 111 mmol/L 105  105  106   CO2 22 - 32 mmol/L 28  27  29    Calcium  8.9 - 10.3 mg/dL 9.3  9.4  46.9   Total Protein 6.5 - 8.1 g/dL 7.3  7.2  7.5   Total Bilirubin 0.0 - 1.2 mg/dL 0.3  0.3  0.3   Alkaline Phos 38 - 126 U/L 52  53  67   AST 15 - 41 U/L 12  11  11    ALT 0 - 44 U/L 13  13  13     . Lab Results  Component Value Date   IRON  53 04/04/2024   TIBC 385 04/04/2024   IRONPCTSAT 14 04/04/2024   (Iron  and TIBC)  Lab Results  Component Value Date   FERRITIN 124 04/04/2024     RADIOGRAPHIC STUDIES: I have personally reviewed the radiological images as listed and agreed with the findings in the report. VAS US  LOWER EXTREMITY VENOUS (DVT) Result Date: 04/08/2024  Lower Venous DVT Study Patient Name:  MIALYNN FUDALA  Date of Exam:   04/08/2024 Medical Rec #: [id number]       Accession #:    [id number] Date of Birth: 1951-12-15        Patient Gender: F Patient Age:   38 years Exam Location:  [facility] Procedure:      VAS US  LOWER EXTREMITY VENOUS (DVT) Referring Phys: Jacquelyn Matt --------------------------------------------------------------------------------  Indications: Follow up exam.  Risk Factors: PE/DVT 08/2023. Limitations: Body habitus and poor ultrasound/tissue interface. Comparison Study: Previous exam on 08/21/2023 was positive for DVT LLE PeroV Performing Technologist: Arlyce Berger RVT, RDMS  Examination Guidelines: A complete evaluation includes B-mode imaging, spectral Doppler, color Doppler, and power Doppler as needed of all accessible portions of each vessel. Bilateral testing is considered an integral part of a complete examination. Limited examinations for reoccurring indications may be performed as noted. The reflux portion of the exam is performed with the patient in reverse Trendelenburg.  +---------+---------------+---------+-----------+----------+-------------------+ RIGHT    CompressibilityPhasicitySpontaneityPropertiesThrombus Aging      +---------+---------------+---------+-----------+----------+-------------------+ CFV      Full           Yes      Yes                                      +---------+---------------+---------+-----------+----------+-------------------+ SFJ      Full                                                             +---------+---------------+---------+-----------+----------+-------------------+ FV Prox  Full           Yes      Yes                                      +---------+---------------+---------+-----------+----------+-------------------+ FV Mid   Full           Yes      Yes                                      +---------+---------------+---------+-----------+----------+-------------------+  FV DistalFull           Yes      Yes                                       +---------+---------------+---------+-----------+----------+-------------------+ PFV      Full                                                             +---------+---------------+---------+-----------+----------+-------------------+ POP      Full           Yes      Yes                                      +---------+---------------+---------+-----------+----------+-------------------+ PTV      Full                                                             +---------+---------------+---------+-----------+----------+-------------------+ PERO                                                  Not well visualized +---------+---------------+---------+-----------+----------+-------------------+   +---------+---------------+---------+-----------+----------+--------------+ LEFT     CompressibilityPhasicitySpontaneityPropertiesThrombus Aging +---------+---------------+---------+-----------+----------+--------------+ CFV      Full           Yes      Yes                                 +---------+---------------+---------+-----------+----------+--------------+ SFJ      Full                                                        +---------+---------------+---------+-----------+----------+--------------+ FV Prox  Full           Yes      Yes                                 +---------+---------------+---------+-----------+----------+--------------+ FV Mid   Full           Yes      Yes                                 +---------+---------------+---------+-----------+----------+--------------+ FV DistalFull           Yes      Yes                                 +---------+---------------+---------+-----------+----------+--------------+  PFV      Full                                                        +---------+---------------+---------+-----------+----------+--------------+ POP      Full           Yes      Yes                                  +---------+---------------+---------+-----------+----------+--------------+ PTV      Full                                                        +---------+---------------+---------+-----------+----------+--------------+ PERO     Full                                                        +---------+---------------+---------+-----------+----------+--------------+     Summary: BILATERAL: - No evidence of deep vein thrombosis seen in the lower extremities, bilaterally. -No evidence of popliteal cyst, bilaterally.   *See table(s) above for measurements and observations. Electronically signed by Angela Kell MD on 04/08/2024 at 5:09:58 PM.    Final    DG BONE DENSITY (DXA) Result Date: 03/31/2024 EXAM: DUAL X-RAY ABSORPTIOMETRY (DXA) FOR BONE MINERAL DENSITY 03/31/2024 2:24 pm CLINICAL DATA:  73 year old Female Postmenopausal. Screening for osteoporosis History of fragility fracture. TECHNIQUE: An axial (e.g., hips, spine) and/or appendicular (e.g., radius) exam was performed, as appropriate, using GE Secretary/administrator at [employer]. Images are obtained for bone mineral density measurement and are not obtained for diagnostic purposes. [id number] Exclusions: L3-L4. COMPARISON:  12/27/2018. FINDINGS: Scan quality: Good. LUMBAR SPINE (L1-L2): BMD (in g/cm2): 1.450 T-score: 2.4 Z-score: 3.4 Rate of change from previous exam: -5.5 % LEFT FEMORAL NECK: BMD (in g/cm2): 1.279 T-score: 1.7 Z-score: 2.5 LEFT TOTAL HIP: BMD (in g/cm2): 1.356 T-score: 2.8 Z-score: 3.3 RIGHT FEMORAL NECK: BMD (in g/cm2): 1.184 T-score: 1.1 Z-score: 1.8 RIGHT TOTAL HIP: BMD (in g/cm2): 1.219 T-score: 1.7 Z-score: 2.2 DUAL-FEMUR TOTAL MEAN: Rate of change from previous exam: No significant rate of change from previous exam. LEFT FOREARM (RADIUS 33%): BMD (in g/cm2): 0.896 T-score: 0.2 Z-score: 1.4 Rate of change from previous exam: No significant rate of change from previous exam. FRAX 10-YEAR PROBABILITY OF  FRACTURE: FRAX not reported as the lowest BMD is not in the osteopenia range. IMPRESSION: Normal based on BMD. Fracture risk is increased. Increased risk is based on history of fragility fracture. RECOMMENDATIONS: 1. All patients should optimize calcium  and vitamin D  intake. 2. Consider FDA-approved medical therapies in postmenopausal women and men aged 68 years and older, based on the following: - A hip or vertebral (clinical or morphometric) fracture - T-score less than or equal to -2.5 and secondary causes have been excluded. - Low bone mass (T-score between -1.0 and -2.5) and a 10-year probability of a hip fracture greater than or equal to 3%  or a 10-year probability of a major osteoporosis-related fracture greater than or equal to 20% based on the [place] -adapted WHO algorithm. - Clinician judgment and/or patient preferences may indicate treatment for people with 10-year fracture probabilities above or below these levels 3. Patients with diagnosis of osteoporosis or at high risk for fracture should have regular bone mineral density tests. For patients eligible for Medicare, routine testing is allowed once every 2 years. The testing frequency can be increased to one year for patients who have rapidly progressing disease, those who are receiving or discontinuing medical therapy to restore bone mass, or have additional risk factors. Electronically Signed   By: Dina  Arceo M.D.   On: 03/31/2024 16:37    ASSESSMENT & PLAN:  73 y.o. female of Jehovah's Witness faith with:  Anemia from blood loss and resulting iron  deficiency Hx of pulmonary embolism 3. Jehova's witness. Patient notes she would not accept prbc transfusion under any circumstances.  PLAN:  -Discussed lab results from 04/11/2024 in detail with patient. CBC showed WBC of 6.9K, hemoglobin of 11.4, and platelets of 371K. -ferritin 124 -iron  saturation 14% -vitamin B12 1,254 pg/mL -homocysteine 19.8 umol/L -Antithrombin III  Func 113 -Protein C  activity 145 -Protein C total 121 -Protein S activity 84 -Protein S total 90 -no detection of lupus anticoagulant  -Beta-2  Glycoprotein I IgG , IgAA and IGM <9 -Factor 5 Leiden not detected -prothrombin gene mutation not detected Anticardiolipin IgG, IgM, and IgA <9  - no definitive factor that was found as a risk factor for VTE -Anemia much improved and resolving. -given significant GI bleeding could not be on anticoagulation at this time. US  Ext venous 4/22- No evidence of deep vein thrombosis seen in the lower extremities,  bilaterally.  -has f/u with GI for continue evaluation and mx of GI bleeding. -if there is no significant concerns for recurrent and the patient is cleared by GI then consider going back on anticoagulation (atleast preventive dose of DOAC).   FOLLOW-UP: RTC with Dr Salomon Cree as needed RTC with PCP in 2-3 months  The total time spent in the appointment was 20 minutes* .  All of the patient's questions were answered with apparent satisfaction. The patient knows to call the clinic with any problems, questions or concerns.   Jacquelyn Matt MD MS AAHIVMS [facility] Hematology/Oncology Physician [facility]  .*Total Encounter Time as defined by the Centers for Medicare and Medicaid Services includes, in addition to the face-to-face time of a patient visit (documented in the note above) non-face-to-face time: obtaining and reviewing outside history, ordering and reviewing medications, tests or procedures, care coordination (communications with other health care professionals or caregivers) and documentation in the medical record.    I,Mitra Faeizi,acting as a Neurosurgeon for Jacquelyn Matt, MD.,have documented all relevant documentation on the behalf of Jacquelyn Matt, MD,as directed by  Jacquelyn Matt, MD while in the presence of Jacquelyn Matt, MD.  .I have reviewed the above documentation for accuracy and completeness, and I agree with the above. .Traver Meckes Kishore Amela Handley  MD

## 2024-04-18 ENCOUNTER — Inpatient Hospital Stay: Attending: Hematology | Admitting: Hematology

## 2024-04-18 DIAGNOSIS — I2699 Other pulmonary embolism without acute cor pulmonale: Secondary | ICD-10-CM

## 2024-04-18 DIAGNOSIS — D508 Other iron deficiency anemias: Secondary | ICD-10-CM | POA: Diagnosis not present

## 2024-04-24 ENCOUNTER — Encounter: Payer: Self-pay | Admitting: Hematology

## 2024-05-08 ENCOUNTER — Other Ambulatory Visit

## 2024-05-15 ENCOUNTER — Other Ambulatory Visit (INDEPENDENT_AMBULATORY_CARE_PROVIDER_SITE_OTHER)

## 2024-05-15 DIAGNOSIS — E669 Obesity, unspecified: Secondary | ICD-10-CM

## 2024-05-15 DIAGNOSIS — E1169 Type 2 diabetes mellitus with other specified complication: Secondary | ICD-10-CM

## 2024-05-15 NOTE — Progress Notes (Signed)
 05/15/2024 Name: [name] MRN: [id number] DOB: [birth year]  Chief Complaint  Patient presents with   Medication Management   Diabetes    [name] is a 73 y.o. year old female who presented for a telephone visit.   They were referred to the pharmacist for assistance in managing diabetes.    Subjective:  Care Team: Primary Care Provider: Garner Jury ; Next Scheduled Visit: 08/19/24  Medication Access/Adherence  Current Pharmacy:  [id number] [place], [place] - [address] AT [facility] [address] [place] [address] Phone: [phone] Fax: [phone]  [facility]) - [place], [place] - [address] [facility] [address] [place] [address] [place] [address] Phone: [phone] Fax: [phone]  [facility] - [place], [place] - [address] [place] [address] Phone: [phone] Fax: [phone]  Melodee Spruce [facility] - [facility] [address] [place] [address] Phone: [phone] Fax: [phone]   Patient reports affordability concerns with their medications: No  Patient reports access/transportation concerns to their pharmacy: No  Patient reports adherence concerns with their medications:  No     Diabetes:  Current medications: Mounjaro  12.5 mg on Thursdays, Metformin  1000mg  BID Medications tried in the past: Glimepiride , Tradjenta , Ozempic   Current glucose readings: Reports fasting sugars 130s-150s, she is unsure if truly "fasting" because she will eat late at night  Patient denies hypoglycemic s/sx including dizziness, shakiness, sweating. Patient denies hyperglycemic symptoms including polyuria, polydipsia, polyphagia, nocturia, neuropathy, blurred vision.  Current meal patterns:  States mounjaro  is really helping with appetite and food cravings and that she has worked  hard to limit her carbs  Current physical activity: tries to exercise a little each day most days  Current medication access support: None   Objective:  Lab Results  Component Value Date   HGBA1C 8.1 (H) 01/26/2024    Lab Results  Component Value Date   CREATININE 1.33 (H) 04/11/2024   BUN 22 04/11/2024   NA 140 04/11/2024   K 4.3 04/11/2024   CL 105 04/11/2024   CO2 28 04/11/2024    Lab Results  Component Value Date   CHOL 158 08/14/2023   HDL 43 08/14/2023   LDLCALC 96 08/14/2023   TRIG 101 08/14/2023   CHOLHDL 3.7 08/14/2023    Medications Reviewed Today     Reviewed by Carnell Christian, RPH (Pharmacist) on 05/15/24 at 1550  Med List Status: <None>   Medication Order Taking? Sig Documenting Provider Last Dose Status Informant  Accu-Chek Softclix Lancets lancets [id number]  TEST 1 TO 2 TIMES DAILY Abram Abraham, NP-C  Active Self, Pharmacy Records  allopurinol  (ZYLOPRIM ) 100 MG tablet [id number]  TAKE 1 TABLET BY MOUTH DAILY Tysinger, Christiane Cowing, PA-C  Active Self, Pharmacy Records  Blood Glucose Monitoring Suppl (SOLUS V2 BLOOD GLUCOSE SYSTEM) DEVI [id number]  Test 1-2 times a day Dx E11.9 Henson, Vickie L, NP-C  Active Self, Pharmacy Records  Cyanocobalamin  (VITAMIN B-12 PO) [id number]  Take 1 tablet by mouth daily at 2 PM. [provider]  Active Self, Pharmacy Records  fenofibrate  (TRICOR ) 145 MG tablet [id number]  TAKE 1 TABLET(145 MG) BY MOUTH DAILY Tysinger, Christiane Cowing, PA-C  Active Self, Pharmacy Records  ferrous gluconate  [facility]) 240 (27 FE) MG tablet [id number]  Take 1 tablet (240 mg total) by mouth 2 (two) times daily  before a meal.  Patient not taking: Reported on 03/04/2024   Pokhrel, Laxman, MD  Active   glucose blood (ACCU-CHEK GUIDE) test strip [id number]  TEST 1 TO 2 TIMES DAILY Lalonde, John C, MD  Active Self, Pharmacy Records  losartan -hydrochlorothiazide  [facility]) 100-12.5 MG tablet [id number]  TAKE 1 TABLET BY MOUTH DAILY Tysinger, Christiane Cowing, PA-C   Active Self, Pharmacy Records  MAGNESIUM  CITRATE PO [id number]  Take 1 tablet by mouth daily as needed. [provider]  Active Self, Pharmacy Records  metFORMIN  (GLUCOPHAGE ) 1000 MG tablet [id number] Yes Take 1 tablet (1,000 mg total) by mouth 2 (two) times daily with a meal. Tysinger, Christiane Cowing, PA-C Taking Active Self, Pharmacy Records  methocarbamol  (ROBAXIN ) 500 MG tablet [id number]  Take 1 tablet (500 mg total) by mouth every 8 (eight) hours as needed for muscle spasms.  Patient not taking: Reported on 01/31/2024   Claudene Crystal, PA-C  Active Self, Pharmacy Records  Multiple Vitamin (MULTIVITAMIN) tablet [id number]  Take 1 tablet by mouth daily. [provider]  Active Self, Pharmacy Records    Discontinued 05/15/24 1550 (Dose change)   tirzepatide  (MOUNJARO ) 12.5 MG/0.5ML Pen [id number] Yes Inject 12.5 mg into the skin once a week. Tysinger, Christiane Cowing, PA-C Taking Active               Assessment/Plan:   Diabetes: - Currently uncontrolled - Reviewed long term cardiovascular and renal outcomes of uncontrolled blood sugar - Reviewed goal A1c, goal fasting, and goal 2 hour post prandial glucose - Reviewed dietary modifications including low carb diet. Recommended sugar free creamer, patient declines -Continue current medication therapy - Patient denies personal or family history of multiple endocrine neoplasia type 2, medullary thyroid  cancer; personal history of pancreatitis or gallbladder disease. - Recommend to check glucose daily at varying times, including post-prandial Counseled on what it means to obtain a truly fasting sugar and goals - Future consideration - if A1C/sugars remain elevated on max Mounjaro , could consider addition of SGLT2    Follow Up Plan: 06/26/24  Carnell Christian, PharmD Clinical Pharmacist [phone]

## 2024-05-20 ENCOUNTER — Telehealth: Payer: Self-pay

## 2024-05-20 ENCOUNTER — Ambulatory Visit (INDEPENDENT_AMBULATORY_CARE_PROVIDER_SITE_OTHER): Admitting: Medical

## 2024-05-20 ENCOUNTER — Encounter: Payer: Self-pay | Admitting: Medical

## 2024-05-20 VITALS — BP 110/70 | HR 68 | Ht 67.0 in | Wt 301.0 lb

## 2024-05-20 DIAGNOSIS — H68002 Unspecified Eustachian salpingitis, left ear: Secondary | ICD-10-CM

## 2024-05-20 DIAGNOSIS — E790 Hyperuricemia without signs of inflammatory arthritis and tophaceous disease: Secondary | ICD-10-CM

## 2024-05-20 DIAGNOSIS — E669 Obesity, unspecified: Secondary | ICD-10-CM

## 2024-05-20 DIAGNOSIS — D509 Iron deficiency anemia, unspecified: Secondary | ICD-10-CM

## 2024-05-20 DIAGNOSIS — I809 Phlebitis and thrombophlebitis of unspecified site: Secondary | ICD-10-CM

## 2024-05-20 DIAGNOSIS — E1169 Type 2 diabetes mellitus with other specified complication: Secondary | ICD-10-CM | POA: Diagnosis not present

## 2024-05-20 DIAGNOSIS — T801XXA Vascular complications following infusion, transfusion and therapeutic injection, initial encounter: Secondary | ICD-10-CM

## 2024-05-20 DIAGNOSIS — H9192 Unspecified hearing loss, left ear: Secondary | ICD-10-CM | POA: Diagnosis not present

## 2024-05-20 MED ORDER — FLUTICASONE PROPIONATE 50 MCG/ACT NA SUSP: 2.0000 | Freq: Every day | NASAL | 1 refills | Status: AC

## 2024-05-20 MED ORDER — TIRZEPATIDE 12.5 MG/0.5ML ~~LOC~~ SOAJ: 12.5000 mg | SUBCUTANEOUS | 0 refills | Status: DC

## 2024-05-20 MED ORDER — ALLOPURINOL 100 MG PO TABS: 100.0000 mg | ORAL_TABLET | Freq: Every day | ORAL | 1 refills | Status: DC

## 2024-05-20 MED ORDER — FENOFIBRATE 145 MG PO TABS: ORAL_TABLET | ORAL | 1 refills | Status: DC

## 2024-05-20 MED ORDER — FLUTICASONE PROPIONATE 50 MCG/ACT NA SUSP: 2.0000 | Freq: Every day | NASAL | 1 refills | Status: DC

## 2024-05-20 NOTE — Progress Notes (Signed)
 Subjective:  [name] is a 73 y.o. female who presents for Chief Complaint  Patient presents with   Acute Visit    Feels like her left arm vein from getting iron  infusion is raised     Here for concerns  She had iron  infusion 4-5 weeks ago.  Feels hard linear things in her left forearm and bruising .  No warmth, no swelling, no fever, no chills, no numbness or tingling.   She notes hx/o some mild hearing loss.  She notes left ear pressure.   Using nothing for this.  No pain, no cough, no sore throat, on drainage .    Seeing hematology, not currently on iron  oral  She is compliant with metformin  and mounjaro   Having some constipation.  Sees GI soon for consult.  No other aggravating or relieving factors.    No other c/o.  Past Medical History:  Diagnosis Date   Abdominal hernia    Acute blood loss as cause of postoperative anemia 02/16/2017   Allergy     Arthritis    knees, back, R ankle    Bilateral carpal tunnel syndrome 04/29/2012   Diabetes mellitus    Diabetes mellitus type 2 in obese 12/31/2015   Diverticulitis    Edema    legs   GERD (gastroesophageal reflux disease)    HOH (hard of hearing)    Hypertension    Hypertriglyceridemia 08/30/2015   Started on medication 08/30/15   Lipoma 09/30/2012   Obesity    morbid obesity   OBESITY, MORBID 10/16/2007   Qualifier: Diagnosis of  By: Lori Rondo     Osteoarthrosis involving lower leg 10/16/2007   Qualifier: Diagnosis of  By: Lori Rondo     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      fenofibrate  (TRICOR ) 145 MG tablet TAKE 1 TABLET(145 MG) BY MOUTH DAILY 90 tablet 1   losartan -hydrochlorothiazide  (HYZAAR) 100-12.5 MG tablet TAKE 1 TABLET BY MOUTH DAILY 100 tablet 1   MAGNESIUM  CITRATE PO Take 1 tablet by mouth daily as needed.     metFORMIN  (GLUCOPHAGE ) 1000 MG tablet Take 1 tablet (1,000 mg total) by mouth 2 (two) times daily with a meal. 180 tablet 2   Multiple Vitamin (MULTIVITAMIN) tablet Take 1 tablet by mouth daily.     tirzepatide  (MOUNJARO ) 12.5 MG/0.5ML Pen Inject 12.5 mg into the skin once a week. 6 mL 0   Accu-Chek Softclix Lancets lancets TEST 1 TO 2 TIMES DAILY 100 each 1   Blood Glucose Monitoring Suppl (SOLUS V2 BLOOD GLUCOSE SYSTEM) DEVI Test 1-2 times a day Dx E11.9 1 each 0   glucose blood (ACCU-CHEK GUIDE) test strip TEST 1 TO 2 TIMES DAILY 200 strip 1   No current facility-administered medications on file prior to visit.    The following portions of the patient's history were reviewed and updated as appropriate: allergies, current medications, past family history, past medical history, past social history, past surgical history and problem list.  ROS Otherwise as in subjective above    Objective: BP 110/70  Pulse 68   Ht 5\' 7"  (1.702 m)   Wt (!) 301 lb (136.5 kg)   LMP  (LMP Unknown)   BMI 47.14 kg/m   Wt Readings from Last 3 Encounters:  05/20/24 (!) 301 lb (136.5 kg)  03/27/24 (!) 304 lb 5 oz (138 kg)  03/14/24 (!) 302 lb 1.9 oz (137 kg)   General appearance: alert, no distress, well developed, well nourished HEENT: normocephalic, sclerae anicteric, conjunctiva pink and moist, TMs pearly, nares patent, no discharge or erythema, pharynx normal Oral cavity: MMM, no lesions Neck: supple, no lymphadenopathy, no thyromegaly, no masses MSK: left forearm with some scattered nodular texture along path of median vein c/w phlebitis.  No fluctuance, warmth, induration.  There is some faint bruising of left forearm mid forearm as well Pulses: 2+  radial pulses, 2+ pedal pulses, normal cap refill Ext: no edema   Assessment: Encounter Diagnoses  Name Primary?   Phlebitis after infusion, initial encounter Yes   Salpingitis of left eustachian tube    Hearing decreased, left    Type 2 diabetes mellitus with obesity (HCC)    Iron  deficiency anemia, unspecified iron  deficiency anemia type      Plan: Phlebitis Use warm compresses of left forearm 2- 3 times daily for the next week Use Aspirin 81mg , 4 tablets or 325mg  daily for the next week If new or worse symptoms or not improving in the next week, then recheck  Hearing decreased - your hearing is abnormal today.   Use the flonase  and other recommendations below. If not improved in the next 2 weeks, let me know.   You can consider follow up with audiology in a few weeks if still having hearing concerns.  Eustachian tube pressure - begin flonase  nasal spray for 1-2 weeks.  You can use some allergy  pill such as benadryl  or zyrtec at night for 1-2 weeks, drink 80-100 ounces of water daily.    Type 2 diabetes - continue metformin  and current dose   Iron  deficiency - follow up with hematology, let infusion center know about the reaction with phlebitis   Anasophia was seen today for acute visit.  Diagnoses and all orders for this visit:  Phlebitis after infusion, initial encounter  Salpingitis of left eustachian tube  Hearing decreased, left  Type 2 diabetes mellitus with obesity (HCC)  Iron  deficiency anemia, unspecified iron  deficiency anemia type  Other orders -     fluticasone  (FLONASE ) 50 MCG/ACT nasal spray; Place 2 sprays into both nostrils daily.    Follow up: soon for diabetes follow up

## 2024-05-20 NOTE — Telephone Encounter (Signed)
 Fenofibrate  tab 145 mg Allopurinol  tab 100mg  Fluticasone  sp. 50 MCG Mounjaro  12.5mg   Pt is requesting following meds to be filled with [facility], does not want to use [facility] for any prescriptions.

## 2024-05-20 NOTE — Patient Instructions (Signed)
 Phlebitis Use warm compresses of left forearm 2- 3 times daily for the next week Use Aspirin 81mg , 4 tablets or 325mg  daily for the next week If new or worse symptoms or not improving in the next week, then recheck  Hearing decreased - your hearing is abnormal today.   Use the flonase  and other recommendations below. If not improved in the next 2 weeks, let me know.   You can consider follow up with audiology in a few weeks if still having hearing concerns.  Eustachian tube pressure - begin flonase  nasal spray for 1-2 weeks.  You can use some allergy  pill such as benadryl  or zyrtec at night for 1-2 weeks, drink 80-100 ounces of water daily.    Type 2 diabetes - continue metformin  and current dose   Iron  deficiency - follow up with hematology, let infusion center know about the reaction with phlebitis

## 2024-05-20 NOTE — Addendum Note (Signed)
 Addended by: Charliene Conte A on: 05/20/2024 04:11 PM   Modules accepted: Orders

## 2024-05-20 NOTE — Telephone Encounter (Signed)
 Faxed over to new pharmacy [facility]

## 2024-05-24 ENCOUNTER — Other Ambulatory Visit: Payer: Self-pay | Admitting: Medical

## 2024-05-24 DIAGNOSIS — I10 Essential (primary) hypertension: Secondary | ICD-10-CM

## 2024-05-29 NOTE — Progress Notes (Signed)
 [facility] Return Visit   Referring Provider Tysinger, Christiane Cowing, PA-C [address] [place],  [place] [address]  Primary Care Provider Tysinger, Christiane Cowing, PA-C  Patient Profile: [name] is a 73 y.o. female Jehovah's Witness with past medical history of PE (eliquis  on hold), morbid obesity, diabetes mellitus type 2, essential hypertension, hyperlipidemia who returns to the [facility] for follow-up of the problem(s) noted below.  Problem List: Diverticular bleed status post hospitalization 01/2024 Acute blood loss anemia secondary to diverticular bleed Colon polyp Change in bowel habits GERD   History of Present Illness   [name] was last seen during inpatient hospitalization 01/2024   Current GI Meds  None  Interval History  [name] was hospitalized at [facility] to 2025 after presenting with symptoms of painless bright red blood per rectum and melena. Hemoglobin declined to a nadir of 6.7 Colonoscopy showed multiple small mouth diverticula in the descending and sigmoid colon, large transverse colon lipoma, 5 mm proximal ascending colon polyp that was not removed in the setting of bleeding, normal TI, IH She was seen by hematology inpatient and received Venofer  infusions; hematology also recommended Procrit Has remained off of Eliquis  since her hospitalization Last CBC 04/11/2024-hemoglobin 11.4  She returns to the office today reporting that she has not had any further bleeding since her hospitalization Notes that her bowel habits have changed -previously stool daily now having 1 very large bowel movement every other day She has been eating fruits and vegetables to keep her bowels regulated Also taking a stool softener which has been beneficial Has had some straining with defecation States that when she has trialed fiber supplements it has resulted in gas Reviewed that her colonoscopy did not show any obstructive abnormalities but  she did have an ascending colon polyp that needs to be removed.  Last colonoscopy:  Colonoscopy 01/2024 - Multiple small mouth diverticula in the descending and sigmoid colon, large transverse colon lipoma, 5 mm proximal ascending colon polyp that was not removed in the setting of bleeding, normal TI, IH Last endoscopy: None  Last Abd CT/CTE/MRE: None  GI Review of Symptoms Significant for change in bowel habits. Otherwise negative.  General Review of Systems  Review of systems is significant for the pertinent positives and negatives as listed per the HPI.  Full ROS is otherwise negative.  Past Medical History   Past Medical History:  Diagnosis Date   Abdominal hernia    Acute blood loss as cause of postoperative anemia 02/16/2017   Allergy     Arthritis    knees, back, R ankle    Bilateral carpal tunnel syndrome 04/29/2012   Diabetes mellitus    Diabetes mellitus type 2 in obese 12/31/2015   Diverticulitis    Edema    legs   GERD (gastroesophageal reflux disease)    HOH (hard of hearing)    Hypertension    Hypertriglyceridemia 08/30/2015   Started on medication 08/30/15   Lipoma 09/30/2012   Obesity    morbid obesity   OBESITY, MORBID 10/16/2007   Qualifier: Diagnosis of  By: Lori Rondo     Osteoarthrosis involving lower leg 10/16/2007   Qualifier: Diagnosis of  By: Lori Rondo     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   right   CARPAL TUNNEL RELEASE Left 07/10/2014   Procedure: LEFT ENDOSCOPIC CARPAL TUNNEL RELEASE ;  Surgeon: Sheryl Donna, MD;  Location: [facility];  Service: Orthopedics;  Laterality: Left;   CARPAL TUNNEL RELEASE Right     COLONOSCOPY     COLONOSCOPY WITH PROPOFOL  N/A 01/27/2024   Procedure: COLONOSCOPY WITH PROPOFOL ;  Surgeon: Truddie Furrow, MD;  Location: [facility] END[facility]COPY;  Service: Gastroenterology;  Laterality: N/A;   HERNIA REPAIR  07/03/2011   abd hernia   JOINT REPLACEMENT     NOSE SURGERY  2008   sinus   TOTAL KNEE ARTHROPLASTY Left 02/06/2017   Procedure: LEFT TOTAL KNEE ARTHROPLASTY;  Surgeon: Arnie Lao, MD;  Location: [facility] OR;  Service: Orthopedics;  Laterality: Left;   TOTAL KNEE ARTHROPLASTY Right 08/21/2017   TOTAL KNEE ARTHROPLASTY Right 08/21/2017   Procedure: RIGHT TOTAL KNEE ARTHROPLASTY;  Surgeon: Arnie Lao, MD;  Location: [facility] OR;  Service: Orthopedics;  Laterality: Right;   TUBAL LIGATION       Allergies and Medications   Allergies  Allergen Reactions   Other Other (See Comments)    NO Blood Transfusions, or giving blood   Crestor [Rosuvastatin] Other (See Comments)    4 prior statins, crestor, lipitor, livalo , bad cramping   Sulfa Antibiotics Rash   Current Meds  Medication Sig   Accu-Chek Softclix Lancets lancets TEST 1 TO 2 TIMES DAILY   allopurinol  (ZYLOPRIM ) 100 MG tablet Take 1 tablet (100 mg total) by mouth daily.   Cyanocobalamin  (VITAMIN B-12 PO) Take 1 tablet by mouth daily at 2 PM.   fenofibrate  (TRICOR ) 145 MG tablet TAKE 1 TABLET(145 MG) BY MOUTH DAILY   fluticasone  (FLONASE ) 50 MCG/ACT nasal spray Place 2 sprays into both nostrils daily.   glucose blood (ACCU-CHEK GUIDE) test strip TEST 1 TO 2 TIMES DAILY   losartan -hydrochlorothiazide  (HYZAAR) 100-12.5 MG tablet TAKE 1 TABLET BY MOUTH DAILY   MAGNESIUM  CITRATE PO Take 1 tablet by mouth daily as needed.   metFORMIN  (GLUCOPHAGE ) 1000 MG tablet Take 1 tablet (1,000 mg total) by mouth 2 (two) times daily with a meal.   Multiple Vitamin (MULTIVITAMIN) tablet Take 1 tablet by mouth daily.   [EXPIRED] Na Sulfate-K Sulfate-Mg Sulfate concentrate (SUPREP) 17.5-3.13-1.6 GM/177ML SOLN Take 1 kit (354  mLs total) by mouth once for 1 dose.   tirzepatide  (MOUNJARO ) 12.5 MG/0.5ML Pen Inject 12.5 mg into the skin once a week.     Family His   Family History  Problem Relation Age of Onset   Hypertension Mother    Kidney disease Mother    Diabetes Father    Hypertension Father    Diabetes Sister    Hypertension Sister    Hypertension Sister    Obesity Sister    Diabetes Sister    Diabetes Brother    Hypertension Brother    Heart disease Brother 47       heart attack   Colon cancer Neg Hx    Esophageal cancer Neg Hx    Liver disease Neg Hx     Social History   Social History   Tobacco Use   Smoking status: Never   Smokeless tobacco: Never  Vaping Use   Vaping status: Never Used  Substance Use Topics   Alcohol use: Not Currently    Comment: occ wine   Drug use: No   [place] reports that  she has never smoked. She has never used smokeless tobacco. She reports that she does not currently use alcohol. She reports that she does not use drugs.  Vital Signs and Physical Examination   Vitals:   05/30/24 1023  BP: 120/80  Pulse: 74   Body mass index is 47.14 kg/m. Weight: (!) 301 lb (136.5 kg)  General: Obese, no acute distress Head: Normocephalic and atraumatic Eyes: Sclerae anicteric, EOMI Lungs: Clear throughout to auscultation Heart: Regular rate and rhythm; No murmurs, rubs or bruits Abdomen: Soft, non tender and non distended. No masses, hepatosplenomegaly or hernias noted. Normal Bowel sounds Rectal: Deferred Musculoskeletal: Symmetrical with no gross deformities   Review of Data   The following data was reviewed at the time of this encounter:   Laboratory Studies      Latest Ref Rng & Units 04/11/2024    3:01 PM 04/04/2024   12:48 PM 03/27/2024   12:50 PM  CBC  WBC 4.0 - 10.5 K/uL 6.9  7.1  8.2   Hemoglobin 12.0 - 15.0 g/dL 62.1  30.8  65.7   Hematocrit 36.0 - 46.0 % 36.7  36.3  36.6   Platelets 150 - 400 K/uL 371  345  290     Lab Results   Component Value Date   LIPASE 21 05/12/2017      Latest Ref Rng & Units 04/11/2024    3:01 PM 04/04/2024   12:48 PM 03/27/2024   12:50 PM  CMP  Glucose 70 - 99 mg/dL 846  962  952   BUN 8 - 23 mg/dL 22  23  37   Creatinine 0.44 - 1.00 mg/dL 8.41  3.24  4.01   Sodium 135 - 145 mmol/L 140  138  140   Potassium 3.5 - 5.1 mmol/L 4.3  4.1  4.5   Chloride 98 - 111 mmol/L 105  105  106   CO2 22 - 32 mmol/L 28  27  29    Calcium  8.9 - 10.3 mg/dL 9.3  9.4  02.7   Total Protein 6.5 - 8.1 g/dL 7.3  7.2  7.5   Total Bilirubin 0.0 - 1.2 mg/dL 0.3  0.3  0.3   Alkaline Phos 38 - 126 U/L 52  53  67   AST 15 - 41 U/L 12  11  11    ALT 0 - 44 U/L 13  13  13       Imaging Studies  None  GI Procedures and Studies  Colonoscopy 01/27/2024 Multiple small mouth diverticula in the descending and sigmoid colon, large transverse colon lipoma, 5 mm proximal ascending colon polyp that was not removed in the setting of bleeding, normal TI, IH  Colonoscopy 03/06/2014 Multiple diverticula in the sigmoid and descending colon, large lipoma in proximal transverse colon, IH, EH  Clinical Impression  It is my clinical impression that [name] is a 72 y.o. female with;  Diverticular bleed status post hospitalization 01/2024 Acute blood loss anemia secondary to diverticular bleed Colon polyp Change in bowel habits GERD  [name] returns to the office today for follow-up of recent hospitalization for diverticular bleeding in February 2025.  She incurred a significant GI bleed with a nadir hemoglobin of 6.7.  Colonoscopy disclosed diverticuli but no other lesions.  A small ascending colon polyp was seen but not removed in the setting of GI bleeding.  She is Jehovah's Witness and does not accept blood products.  She has been managed with iron  infusions.  Eliquis  is currently on  hold and she has been reevaluated by hematology oncology.  Heme-onc notes suggest consideration of reinstating anticoagulation in the future.  I  discussed with [name] that recurrence rates of diverticular bleeds are approximately 30% and there is unfortunately no way to predict which patients may have another bleed.  Anticoagulation can increase this risk but it is a risk-benefit analysis.  If she is deemed to need anticoagulation by hematology oncology it would be reasonable to resume it given that there is a two thirds chance that she would not have a recurrent diverticular bleed.  She is in need of resection of the colon polyp in her ascending colon that was not removed at the time of her recent colonoscopy.  We discussed scheduling a future colonoscopy for this purpose.  It is noted that she has difficult venous access and BMI is near 50.  For these reasons, I have recommended scheduling her procedure in a hospital-based unit.  Plan  Schedule colonoscopy at [facility] Continue to monitor CBC, hemoglobin Continue stool softener related to change in bowel habits  Planned Follow Up PRN  The patient or caregiver verbalized understanding of the material covered, with no barriers to understanding. All questions were answered. Patient or caregiver is agreeable with the plan outlined above.    It was a pleasure to see [name].  If you have any questions or concerns regarding this evaluation, do not hesitate to contact me.  Eugenia Hess, MD [facility]

## 2024-05-30 ENCOUNTER — Ambulatory Visit (INDEPENDENT_AMBULATORY_CARE_PROVIDER_SITE_OTHER): Admitting: Pediatrics

## 2024-05-30 ENCOUNTER — Encounter: Payer: Self-pay | Admitting: Pediatrics

## 2024-05-30 VITALS — BP 120/80 | HR 74 | Ht 67.0 in | Wt 301.0 lb

## 2024-05-30 DIAGNOSIS — K635 Polyp of colon: Secondary | ICD-10-CM

## 2024-05-30 DIAGNOSIS — K922 Gastrointestinal hemorrhage, unspecified: Secondary | ICD-10-CM

## 2024-05-30 DIAGNOSIS — K5731 Diverticulosis of large intestine without perforation or abscess with bleeding: Secondary | ICD-10-CM

## 2024-05-30 DIAGNOSIS — D62 Acute posthemorrhagic anemia: Secondary | ICD-10-CM

## 2024-05-30 DIAGNOSIS — R194 Change in bowel habit: Secondary | ICD-10-CM

## 2024-05-30 MED ORDER — NA SULFATE-K SULFATE-MG SULF 17.5-3.13-1.6 GM/177ML PO SOLN: 1.0000 | Freq: Once | ORAL | 0 refills | Status: AC

## 2024-05-30 NOTE — Patient Instructions (Signed)
 You have been scheduled for a colonoscopy. Please follow written instructions given to you at your visit today.   If you use inhalers (even only as needed), please bring them with you on the day of your procedure.  DO NOT TAKE 7 DAYS PRIOR TO TEST- Trulicity (dulaglutide) Ozempic , Wegovy  (semaglutide ) Mounjaro  (tirzepatide ) Bydureon Bcise (exanatide extended release)  DO NOT TAKE 1 DAY PRIOR TO YOUR TEST Rybelsus  (semaglutide ) Adlyxin (lixisenatide) Victoza (liraglutide) Byetta (exanatide) ___________________________________________________________________________  Thank you for entrusting me with your care and for choosing [facility], Dr. Eugenia Hess   _______________________________________________________  If your blood pressure at your visit was 140/90 or greater, please contact your primary care physician to follow up on this.  _______________________________________________________  If you are age 87 or older, your body mass index should be between 23-30. Your Body mass index is 47.14 kg/m. If this is out of the aforementioned range listed, please consider follow up with your Primary Care Provider.  If you are age 11 or younger, your body mass index should be between 19-25. Your Body mass index is 47.14 kg/m. If this is out of the aformentioned range listed, please consider follow up with your Primary Care Provider.   ________________________________________________________  The [facility] providers would like to encourage you to use MYCHART to communicate with providers for non-urgent requests or questions.  Due to long hold times on the telephone, sending your provider a message by [facility] may be a faster and more efficient way to get a response.  Please allow 48 business hours for a response.  Please remember that this is for non-urgent requests.  _______________________________________________________

## 2024-06-23 ENCOUNTER — Other Ambulatory Visit: Payer: Self-pay | Admitting: Medical

## 2024-06-26 ENCOUNTER — Other Ambulatory Visit

## 2024-06-26 DIAGNOSIS — E1169 Type 2 diabetes mellitus with other specified complication: Secondary | ICD-10-CM

## 2024-06-26 DIAGNOSIS — E669 Obesity, unspecified: Secondary | ICD-10-CM

## 2024-06-26 NOTE — Progress Notes (Signed)
 06/26/2024 Name: [name] MRN: [id number] DOB: 1951/11/18  Chief Complaint  Patient presents with   Medication Management   Diabetes    [name] is a 73 y.o. year old female who presented for a telephone visit.   They were referred to the pharmacist for assistance in managing diabetes.    Subjective:  Care Team: Primary Care Provider: Bulah Alm GORMAN DEVONNA ; Next Scheduled Visit: 08/19/24  Medication Access/Adherence  Current Pharmacy:  [facility] - [place], [place] - [address] [facility] [address] [place] [address] [place] [address] Phone: [phone] Fax: [phone]  [facility] - [place], [place] - [address] [place] [address] Phone: [phone] Fax: [phone]   Patient reports affordability concerns with their medications: No  Patient reports access/transportation concerns to their pharmacy: No  Patient reports adherence concerns with their medications:  No     Diabetes:  Current medications: Mounjaro  12.5 mg on Thursdays, Metformin  1000mg  BID Medications tried in the past: Glimepiride , Tradjenta , Ozempic   Current glucose readings: Reports fasting sugars 130s-150s, she is unsure if truly fasting because she will eat late at night. Reports post-prandial sugars staying under 180 most of the time but was 191 yesterday  Patient denies hypoglycemic s/sx including dizziness, shakiness, sweating. Patient denies hyperglycemic symptoms including polyuria, polydipsia, polyphagia, nocturia, neuropathy, blurred vision.  Current meal patterns:  States mounjaro  is really helping with appetite and food cravings and that she has worked hard to limit her carbs  Current physical activity: tries to exercise a little each day most days  Current medication access support: None   Objective:  Lab Results  Component Value Date   HGBA1C 8.1 (H) 01/26/2024    Lab  Results  Component Value Date   CREATININE 1.33 (H) 04/11/2024   BUN 22 04/11/2024   NA 140 04/11/2024   K 4.3 04/11/2024   CL 105 04/11/2024   CO2 28 04/11/2024    Lab Results  Component Value Date   CHOL 158 08/14/2023   HDL 43 08/14/2023   LDLCALC 96 08/14/2023   TRIG 101 08/14/2023   CHOLHDL 3.7 08/14/2023    Medications Reviewed Today     Reviewed by Lionell Jon DEL, RPH (Pharmacist) on 06/26/24 at 1549  Med List Status: <None>   Medication Order Taking? Sig Documenting Provider Last Dose Status Informant  Accu-Chek Softclix Lancets lancets [id number]  TEST 1 TO 2 TIMES DAILY Henson, Vickie L, NP-C  Active Self, Pharmacy Records  allopurinol  (ZYLOPRIM ) 100 MG tablet [id number] Yes Take 1 tablet (100 mg total) by mouth daily. Tysinger, Alm GORMAN, PA-C  Active   Blood Glucose Monitoring Suppl (SOLUS V2 BLOOD GLUCOSE SYSTEM) DEVI [id number]  Test 1-2 times a day Dx E11.9 Henson, Vickie L, NP-C  Active Self, Pharmacy Records  Cyanocobalamin  (VITAMIN B-12 PO) [id number] Yes Take 1 tablet by mouth daily at 2 PM. [provider]  Active Self, Pharmacy Records  fenofibrate  (TRICOR ) 145 MG tablet [id number] Yes TAKE 1 TABLET(145 MG) BY MOUTH DAILY Tysinger, Alm GORMAN, PA-C  Active   fluticasone  (FLONASE ) 50 MCG/ACT nasal spray [id number] Yes Place 2 sprays into both nostrils daily. Tysinger, Alm GORMAN, PA-C  Active   glucose blood (ACCU-CHEK GUIDE) test strip [id number]  TEST 1 TO 2 TIMES DAILY Joyce Norleen BROCKS, MD  Active Self, Pharmacy Records  losartan -hydrochlorothiazide  [facility]) 100-12.5 MG tablet [id number] Yes TAKE 1 TABLET BY MOUTH DAILY  Tysinger, David S, PA-C  Active   MAGNESIUM  CITRATE PO [id number] Yes Take 1 tablet by mouth daily as needed. [provider]  Active Self, Pharmacy Records  metFORMIN  (GLUCOPHAGE ) 1000 MG tablet [id number] Yes TAKE 1 TABLET(1000 MG) BY MOUTH TWICE DAILY WITH A MEAL Tysinger, Alm RAMAN, PA-C  Active   Multiple Vitamin (MULTIVITAMIN) tablet  [id number] Yes Take 1 tablet by mouth daily. [provider]  Active Self, Pharmacy Records  tirzepatide  (MOUNJARO ) 12.5 MG/0.5ML Pen [id number] Yes Inject 12.5 mg into the skin once a week. Tysinger, Alm RAMAN, PA-C  Active               Assessment/Plan:   Diabetes: - Currently uncontrolled - Reviewed long term cardiovascular and renal outcomes of uncontrolled blood sugar - Reviewed goal A1c, goal fasting, and goal 2 hour post prandial glucose - Reviewed dietary modifications including low carb diet. Recommended sugar free creamer, patient declines -Continue current medication therapy - Patient denies personal or family history of multiple endocrine neoplasia type 2, medullary thyroid  cancer; personal history of pancreatitis or gallbladder disease. - Recommend to check glucose daily at varying times, including post-prandial Counseled on what it means to obtain a truly fasting sugar and goals - Future consideration - Maximize mounjaro  to 15mg  at next follow up if sugars remain elevated. if A1C/sugars remain elevated on max Mounjaro , could consider addition of SGLT2    Follow Up Plan: 07/31/24  Jon VEAR Lindau, PharmD Clinical Pharmacist [phone]

## 2024-06-29 ENCOUNTER — Other Ambulatory Visit: Payer: Self-pay | Admitting: Medical

## 2024-07-09 ENCOUNTER — Telehealth: Payer: Self-pay | Admitting: Gastroenterology

## 2024-07-09 NOTE — Telephone Encounter (Signed)
 Procedure:Colonoscopy Procedure date: 07/17/24 Procedure location: [facility] Arrival Time: 8:15 am Spoke with the patient Y/N: Yes Any prep concerns? No  Has the patient obtained the prep from the pharmacy ? Yes Do you have a care partner and transportation: Yes Any additional concerns? No

## 2024-07-10 ENCOUNTER — Encounter (HOSPITAL_COMMUNITY): Payer: Self-pay | Admitting: Pediatrics

## 2024-07-10 NOTE — Progress Notes (Signed)
 Attempted to obtain medical history for pre op call via telephone, unable to reach at this time. HIPAA compliant voicemail message left requesting return call to pre surgical testing department.

## 2024-07-14 MED ORDER — TIRZEPATIDE 12.5 MG/0.5ML ~~LOC~~ SOAJ: 12.5000 mg | SUBCUTANEOUS | 0 refills | Status: DC

## 2024-07-14 NOTE — Addendum Note (Signed)
 Addended by: VICCI HUSBAND A on: 07/14/2024 11:27 AM   Modules accepted: Orders

## 2024-07-14 NOTE — Telephone Encounter (Unsigned)
 Copied from CRM [id number]. Topic: Clinical - Prescription Issue >> Jul 14, 2024 10:32 AM Charolett L wrote: Reason for RMF:[id number] stated that medication tirzepatide  (MOUNJARO ) 12.5 MG/0.5ML Pen hasn't been received [facility] as of yet and wanted [facility] to be [facility]rout[facility] [address] [place] [place] [address]

## 2024-07-14 NOTE — Telephone Encounter (Signed)
 Sent in a new rx to local pharmacy

## 2024-07-15 ENCOUNTER — Telehealth (HOSPITAL_COMMUNITY): Payer: Self-pay

## 2024-07-15 NOTE — Telephone Encounter (Signed)
 [name] was scheduled for colonoscopy with Dr. Suzann on 07/17/24 at [facility].   Patient/or family called on 07/15/24 to cancel their procedure due to inability to pay for visit.  Pt was given number for billing department and called them to discuss alternative payment issues but pt decided to just cancel.  Dr Suzann & office notified. Patient instructed to call physician's office to reschedule their procedure. Patient demonstrated understanding.

## 2024-07-17 ENCOUNTER — Encounter (HOSPITAL_COMMUNITY): Admission: RE | Payer: Self-pay | Source: Home / Self Care

## 2024-07-17 ENCOUNTER — Ambulatory Visit (HOSPITAL_COMMUNITY): Admission: RE | Admit: 2024-07-17 | Source: Home / Self Care | Admitting: Pediatrics

## 2024-07-17 SURGERY — COLONOSCOPY
Anesthesia: Monitor Anesthesia Care

## 2024-07-31 ENCOUNTER — Other Ambulatory Visit

## 2024-07-31 DIAGNOSIS — E1169 Type 2 diabetes mellitus with other specified complication: Secondary | ICD-10-CM

## 2024-07-31 DIAGNOSIS — E669 Obesity, unspecified: Secondary | ICD-10-CM

## 2024-07-31 NOTE — Progress Notes (Signed)
 07/31/2024 Name: [name] MRN: [id number] DOB: [birth year]  Chief Complaint  Patient presents with   Medication Management   Diabetes    [name] is a 73 y.o. year old female who presented for a telephone visit.   They were referred to the pharmacist for assistance in managing diabetes.    Subjective:  Care Team: Primary Care Provider: Bulah Alm GORMAN DEVONNA ; Next Scheduled Visit: 08/19/24  Medication Access/Adherence  Current Pharmacy:  [facility] - [place], [place] - [address] [place] [address] [place] [address] Phone: [phone] Fax: [phone]  [facility] - [place], [place] - [address] [place] [address] Phone: [phone] Fax: [phone]   Patient reports affordability concerns with their medications: No  Patient reports access/transportation concerns to their pharmacy: No  Patient reports adherence concerns with their medications:  No     Diabetes:  Current medications: Mounjaro 12.5 mg on Thursdays, Metformin 1000mg  BID Medications tried in the past: Glimepiride, Tradjenta, Ozempic  Current glucose readings:  Has switched to checking post-prandial sugars and reports all readings under 180. However, only checking once a week.   Patient denies hypoglycemic s/sx including dizziness, shakiness, sweating. Patient denies hyperglycemic symptoms including polyuria, polydipsia, polyphagia, nocturia, neuropathy, blurred vision.  Current meal patterns:  States mounjaro is really helping with appetite and food cravings and that she has worked hard to limit her carbs. No longer eating a snack late at night  Current physical activity: tries to exercise a little each day most days  Current medication access support: None   Objective:  Lab Results  Component Value Date   HGBA1C 8.1 (H) 01/26/2024    Lab Results  Component Value  Date   CREATININE 1.33 (H) 04/11/2024   BUN 22 04/11/2024   NA 140 04/11/2024   K 4.3 04/11/2024   CL 105 04/11/2024   CO2 28 04/11/2024    Lab Results  Component Value Date   CHOL 158 08/14/2023   HDL 43 08/14/2023   LDLCALC 96 08/14/2023   TRIG 101 08/14/2023   CHOLHDL 3.7 08/14/2023    Medications Reviewed Today     Reviewed by Lionell Jon DEL, RPH (Pharmacist) on 07/31/24 at 1608  Med List Status: <None>   Medication Order Taking? Sig Documenting Provider Last Dose Status Informant  Accu-Chek Softclix Lancets lancets [id number]  TEST 1 TO 2 TIMES DAILY Henson, Vickie L, NP-C  Active Self, Pharmacy Records  allopurinol (ZYLOPRIM) 100 MG tablet [id number] Yes Take 1 tablet (100 mg total) by mouth daily. Tysinger, Alm GORMAN, PA-C  Active   Blood Glucose Monitoring Suppl (SOLUS V2 BLOOD GLUCOSE SYSTEM) DEVI [id number]  Test 1-2 times a day Dx E11.9 Henson, Vickie L, NP-C  Active Self, Pharmacy Records  Cyanocobalamin (VITAMIN B-12 PO) [id number] Yes Take 1 tablet by mouth daily at 2 PM. [provider]  Active Self, Pharmacy Records  fenofibrate (TRICOR) 145 MG tablet [id number] Yes TAKE 1 TABLET BY MOUTH DAILY Tysinger, Alm GORMAN, PA-C  Active   fluticasone (FLONASE) 50 MCG/ACT nasal spray [id number]  Place 2 sprays into both nostrils daily.  Patient not taking: Reported on 07/31/2024   Bulah Alm GORMAN, PA-C  Active   glucose blood (ACCU-CHEK GUIDE) test strip [id number]  TEST 1 TO 2 TIMES DAILY Joyce Norleen BROCKS, MD  Active Self, Pharmacy Records  losartan-hydrochlorothiazide (HYZAAR) 100-12.5 MG tablet [id number] Yes TAKE  1 TABLET BY MOUTH DAILY Tysinger, David S, PA-C  Active   MAGNESIUM CITRATE PO [id number] Yes Take 1 tablet by mouth daily as needed. [provider]  Active Self, Pharmacy Records  metFORMIN (GLUCOPHAGE) 1000 MG tablet [id number] Yes TAKE 1 TABLET(1000 MG) BY MOUTH TWICE DAILY WITH A MEAL Tysinger, Alm RAMAN, PA-C  Active   Multiple Vitamin (MULTIVITAMIN)  tablet [id number] Yes Take 1 tablet by mouth daily. [provider]  Active Self, Pharmacy Records  tirzepatide [facility]) 12.5 MG/0.5ML Pen [id number] Yes Inject 12.5 mg into the skin once a week. Tysinger, Alm RAMAN, PA-C  Active               Assessment/Plan:   Diabetes: - Currently uncontrolled - Reviewed long term cardiovascular and renal outcomes of uncontrolled blood sugar - Reviewed goal A1c, goal fasting, and goal 2 hour post prandial glucose - Reviewed dietary modifications including low carb diet. Recommended sugar free creamer, patient declines -Continue current medication therapy - Patient denies personal or family history of multiple endocrine neoplasia type 2, medullary thyroid cancer; personal history of pancreatitis or gallbladder disease. - Recommend to check glucose daily at varying times, including post-prandial Counseled on what it means to obtain a truly fasting sugar and goals - Future consideration - Maximize mounjaro to 15mg  at next follow up if sugars remain elevated. if A1C/sugars remain elevated on max Mounjaro, could consider addition of SGLT2 -Update lipid panel at next visit to assess LDL, unable to tolerate statin, may need zetia or repatha if elevated    Follow Up Plan: 09/18/24  Jon VEAR Lindau, PharmD Clinical Pharmacist [phone]

## 2024-08-02 ENCOUNTER — Other Ambulatory Visit: Payer: Self-pay | Admitting: Medical

## 2024-08-02 DIAGNOSIS — I10 Essential (primary) hypertension: Secondary | ICD-10-CM

## 2024-08-19 ENCOUNTER — Ambulatory Visit (INDEPENDENT_AMBULATORY_CARE_PROVIDER_SITE_OTHER): Payer: 59 | Admitting: Medical

## 2024-08-19 VITALS — BP 124/70 | HR 92 | Ht 67.0 in | Wt 299.6 lb

## 2024-08-19 DIAGNOSIS — E669 Obesity, unspecified: Secondary | ICD-10-CM

## 2024-08-19 DIAGNOSIS — H903 Sensorineural hearing loss, bilateral: Secondary | ICD-10-CM

## 2024-08-19 DIAGNOSIS — E041 Nontoxic single thyroid nodule: Secondary | ICD-10-CM | POA: Diagnosis not present

## 2024-08-19 DIAGNOSIS — E790 Hyperuricemia without signs of inflammatory arthritis and tophaceous disease: Secondary | ICD-10-CM | POA: Diagnosis not present

## 2024-08-19 DIAGNOSIS — E1169 Type 2 diabetes mellitus with other specified complication: Secondary | ICD-10-CM

## 2024-08-19 DIAGNOSIS — Z789 Other specified health status: Secondary | ICD-10-CM

## 2024-08-19 DIAGNOSIS — E1159 Type 2 diabetes mellitus with other circulatory complications: Secondary | ICD-10-CM | POA: Diagnosis not present

## 2024-08-19 DIAGNOSIS — Z23 Encounter for immunization: Secondary | ICD-10-CM | POA: Diagnosis not present

## 2024-08-19 DIAGNOSIS — I10 Essential (primary) hypertension: Secondary | ICD-10-CM

## 2024-08-19 DIAGNOSIS — I152 Hypertension secondary to endocrine disorders: Secondary | ICD-10-CM | POA: Diagnosis not present

## 2024-08-19 DIAGNOSIS — Z86711 Personal history of pulmonary embolism: Secondary | ICD-10-CM | POA: Insufficient documentation

## 2024-08-19 DIAGNOSIS — E785 Hyperlipidemia, unspecified: Secondary | ICD-10-CM

## 2024-08-19 DIAGNOSIS — Z0000 Encounter for general adult medical examination without abnormal findings: Secondary | ICD-10-CM | POA: Diagnosis not present

## 2024-08-19 DIAGNOSIS — E1121 Type 2 diabetes mellitus with diabetic nephropathy: Secondary | ICD-10-CM

## 2024-08-19 DIAGNOSIS — Z972 Presence of dental prosthetic device (complete) (partial): Secondary | ICD-10-CM

## 2024-08-19 MED ORDER — PSEUDOEPHEDRINE HCL 60 MG PO TABS
60.0000 mg | ORAL_TABLET | Freq: Two times a day (BID) | ORAL | 0 refills | Status: DC
Start: 2024-08-19 — End: 2024-10-21

## 2024-08-19 MED ORDER — NEOMYCIN-POLYMYXIN-HC 1 % OT SOLN
3.0000 [drp] | Freq: Three times a day (TID) | OTIC | 0 refills | Status: AC
Start: 2024-08-19 — End: ?

## 2024-08-19 NOTE — Progress Notes (Signed)
 Subjective:   HPI  [name] is a 73 y.o. female who presents for Chief Complaint  Patient presents with   Annual Exam    Nonfasting cpe, still having left ear concerns- the nose spray that was given isn't helping     Patient Care Team: Zohar Maroney, Alm GORMAN RIGGERS as PCP - General (Family Medicine) [facility] [place], Jon DEL, [place] (Pharmacist) Onesimo Emaline Brink, MD as Consulting Physician (Hematology) Dr. Rome Pereyra, dermatology Dr. Kayla Dee, eye Dr. Eduard Hanlon, neurology Dr. Victory Gunnels, neurosurgery [facility], Dr. Rosalie, GI Dr. Velia Pry, ENT Dr. Lonni Poli, ortho   Concerns: Here for well visit, nonfasting  She still having ongoing left ear pressure, pressure sensation, congestion sensation and ringing.  Recent Flonase  did not help.  She has also tried allergy  pill but that did not help either  Reviewed their medical, surgical, family, social, medication, and allergy  history and updated chart as appropriate.  Allergies  Allergen Reactions   Other Other (See Comments)    NO Blood Transfusions, or giving blood   Crestor [Rosuvastatin] Other (See Comments)    4 prior statins, crestor, lipitor, livalo , bad cramping   Sulfa Antibiotics Rash    Past Medical History:  Diagnosis Date   Abdominal hernia    Acute blood loss as cause of postoperative anemia 02/16/2017   Allergy     Arthritis    knees, back, R ankle    Bilateral carpal tunnel syndrome 04/29/2012   Diabetes mellitus    Diabetes mellitus type 2 in obese 12/31/2015   Diverticulitis    Edema    legs   GERD (gastroesophageal reflux disease)    HOH (hard of hearing)    Hypertension    Hypertriglyceridemia 08/30/2015   Started on medication 08/30/15   Lipoma 09/30/2012   Obesity    morbid obesity   OBESITY, MORBID 10/16/2007   Qualifier: Diagnosis of  By: Candia Moats     Osteoarthrosis involving lower leg 10/16/2007    Qualifier: Diagnosis of  By: Candia Moats     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 90 tablet 1   Cyanocobalamin  (VITAMIN B-12 PO) Take 1 tablet by mouth daily at 2 PM.     fenofibrate  (TRICOR ) 145 MG tablet TAKE 1 TABLET BY MOUTH DAILY 100 tablet 1   fluticasone  (FLONASE ) 50 MCG/ACT nasal spray Place 2 sprays into both nostrils daily. 16 mL 1   losartan -hydrochlorothiazide  (HYZAAR) 100-12.5 MG tablet TAKE 1 TABLET BY MOUTH DAILY 100 tablet 2   MAGNESIUM  CITRATE PO Take 1 tablet by mouth daily as needed.     metFORMIN  (GLUCOPHAGE ) 1000 MG tablet TAKE 1 TABLET(1000 MG) BY MOUTH TWICE DAILY WITH A MEAL 180 tablet 1   Multiple Vitamin (MULTIVITAMIN) tablet Take 1 tablet by mouth daily.     tirzepatide  (MOUNJARO ) 12.5 MG/0.5ML Pen Inject 12.5 mg into the skin once a week. 6 mL 0   Accu-Chek Softclix Lancets lancets TEST 1 TO 2 TIMES DAILY 100 each 1   Blood Glucose Monitoring Suppl (SOLUS V2 BLOOD GLUCOSE SYSTEM) DEVI Test 1-2 times a day Dx E11.9 1  each 0   glucose blood (ACCU-CHEK GUIDE) test strip TEST 1 TO 2 TIMES DAILY 200 strip 1   No current facility-administered medications on file prior to visit.      Current Outpatient Medications:    allopurinol  (ZYLOPRIM ) 100 MG tablet, Take 1 tablet (100 mg total) by mouth daily., Disp: 90 tablet, Rfl: 1   Cyanocobalamin  (VITAMIN B-12 PO), Take 1 tablet by mouth daily at 2 PM., Disp: , Rfl:    fenofibrate  (TRICOR ) 145 MG tablet, TAKE 1 TABLET BY MOUTH DAILY, Disp: 100 tablet, Rfl: 1   fluticasone  (FLONASE ) 50 MCG/ACT nasal spray, Place 2 sprays into both nostrils daily., Disp: 16 mL, Rfl: 1    losartan -hydrochlorothiazide  (HYZAAR) 100-12.5 MG tablet, TAKE 1 TABLET BY MOUTH DAILY, Disp: 100 tablet, Rfl: 2   MAGNESIUM  CITRATE PO, Take 1 tablet by mouth daily as needed., Disp: , Rfl:    metFORMIN  (GLUCOPHAGE ) 1000 MG tablet, TAKE 1 TABLET(1000 MG) BY MOUTH TWICE DAILY WITH A MEAL, Disp: 180 tablet, Rfl: 1   Multiple Vitamin (MULTIVITAMIN) tablet, Take 1 tablet by mouth daily., Disp: , Rfl:    NEOMYCIN -POLYMYXIN-HYDROCORTISONE (CORTISPORIN) 1 % SOLN OTIC solution, Place 3 drops into the left ear every 8 (eight) hours., Disp: 10 mL, Rfl: 0   pseudoephedrine  (SUDAFED) 60 MG tablet, Take 1 tablet (60 mg total) by mouth in the morning and at bedtime., Disp: 12 tablet, Rfl: 0   tirzepatide  (MOUNJARO ) 12.5 MG/0.5ML Pen, Inject 12.5 mg into the skin once a week., Disp: 6 mL, Rfl: 0   Accu-Chek Softclix Lancets lancets, TEST 1 TO 2 TIMES DAILY, Disp: 100 each, Rfl: 1   Blood Glucose Monitoring Suppl (SOLUS V2 BLOOD GLUCOSE SYSTEM) DEVI, Test 1-2 times a day Dx E11.9, Disp: 1 each, Rfl: 0   glucose blood (ACCU-CHEK GUIDE) test strip, TEST 1 TO 2 TIMES DAILY, Disp: 200 strip, Rfl: 1  Family History  Problem Relation Age of Onset   Hypertension Mother    Kidney disease Mother    Diabetes Father    Hypertension Father    Diabetes Sister    Hypertension Sister    Hypertension Sister    Obesity Sister    Diabetes Sister    Diabetes Brother    Hypertension Brother    Heart disease Brother 14       heart attack   Colon cancer Neg Hx    Esophageal cancer Neg Hx    Liver disease Neg Hx     Past Surgical History:  Procedure Laterality Date   ANKLE FRACTURE SURGERY  1998?   right   CARPAL TUNNEL RELEASE Left 07/10/2014   Procedure: LEFT ENDOSCOPIC CARPAL TUNNEL RELEASE ;  Surgeon: Alm DELENA Hummer, MD;  Location: [facility];  Service: Orthopedics;  Laterality: Left;   CARPAL TUNNEL RELEASE Right    COLONOSCOPY     COLONOSCOPY WITH PROPOFOL  N/A 01/27/2024   Procedure:  COLONOSCOPY WITH PROPOFOL ;  Surgeon: Suzann Inocente HERO, MD;  Location: [facility] ENDOSCOPY;  Service: Gastroenterology;  Laterality: N/A;   HERNIA REPAIR  07/03/2011   abd hernia   JOINT REPLACEMENT     NOSE SURGERY  2008   sinus   TOTAL KNEE ARTHROPLASTY Left 02/06/2017   Procedure: LEFT TOTAL KNEE ARTHROPLASTY;  Surgeon: Lonni CINDERELLA Poli, MD;  Location: [facility] OR;  Service: Orthopedics;  Laterality: Left;   TOTAL KNEE ARTHROPLASTY Right 08/21/2017   TOTAL KNEE ARTHROPLASTY Right 08/21/2017   Procedure: RIGHT TOTAL KNEE ARTHROPLASTY;  Surgeon: Poli Lonni  Y, MD;  Location: [facility] OR;  Service: Orthopedics;  Laterality: Right;   TUBAL LIGATION      Review of Systems  Constitutional:  Negative for chills, fever, malaise/fatigue and weight loss.  HENT:  Positive for ear pain and hearing loss. Negative for congestion, sinus pain, sore throat and tinnitus.   Eyes:  Negative for blurred vision, pain and redness.  Respiratory:  Negative for cough, hemoptysis and shortness of breath.   Cardiovascular:  Negative for chest pain, palpitations, orthopnea, claudication and leg swelling.  Gastrointestinal:  Negative for abdominal pain, blood in stool, constipation, diarrhea, nausea and vomiting.  Genitourinary:  Negative for dysuria, flank pain, frequency, hematuria and urgency.  Musculoskeletal:  Negative for falls, joint pain and myalgias.  Skin:  Negative for itching and rash.  Neurological:  Negative for dizziness, tingling, speech change, weakness and headaches.  Endo/Heme/Allergies:  Negative for polydipsia. Does not bruise/bleed easily.  Psychiatric/Behavioral:  Negative for depression and memory loss. The patient is not nervous/anxious and does not have insomnia.         Objective:  BP 124/70   Pulse 92   Ht 5' 7 (1.702 m)   Wt 299 lb 9.6 oz (135.9 kg)   LMP  (LMP Unknown)   SpO2 97%   BMI 46.92 kg/m   Wt Readings from Last 3 Encounters:  08/19/24 299 lb 9.6 oz (135.9 kg)   05/30/24 (!) 301 lb (136.5 kg)  05/20/24 (!) 301 lb (136.5 kg)   BP Readings from Last 3 Encounters:  08/19/24 124/70  05/30/24 120/80  05/20/24 110/70    General appearance: alert, no distress, WD/WN, African American female Skin: Unremarkable HEENT: Somewhat decreased light reflex on the left TM but otherwise normal ear exam, normocephalic, conjunctiva/corneas normal, sclerae anicteric, PERRLA, EOMi, nares patent, no discharge or erythema, pharynx normal Oral cavity: MMM, tongue normal, teeth normal Neck: supple, no lymphadenopathy, no thyromegaly, no masses, normal ROM, no bruits Chest: non tender, normal shape and expansion Heart: RRR, normal S1, S2, no murmurs Lungs: CTA bilaterally, no wheezes, rhonchi, or rales Abdomen: +bs, soft, non tender, non distended, no masses, no hepatomegaly, no splenomegaly, no bruits Back: non tender, normal ROM, no scoliosis Musculoskeletal: upper extremities non tender, no obvious deformity, normal ROM throughout, lower extremities non tender, no obvious deformity, normal ROM throughout Extremities: no edema, no cyanosis, no clubbing Pulses: 2+ symmetric, upper and lower extremities, normal cap refill Neurological: Somewhat cautious with gait and cautious getting up on the exam table.  Otherwise alert, oriented x 3, CN2-12 intact, strength normal upper extremities and lower extremities, sensation normal throughout, DTRs 2+ throughout, no cerebellar signs, gait normal Psychiatric: normal affect, behavior normal, pleasant  Breast/gyn/rectal - deferred to gynecology   Diabetic Foot Exam - Simple   Simple Foot Form Diabetic Foot exam was performed with the following findings: Yes 08/19/2024  3:57 PM  Visual Inspection See comments: Yes Sensation Testing See comments: Yes Pulse Check Posterior Tibialis and Dorsalis pulse intact bilaterally: Yes Comments Relatively flat feet, somewhat decreased monofilament sensation throughout both feet        Assessment and Plan :   Encounter Diagnoses  Name Primary?   Encounter for health maintenance examination in adult Yes   Type 2 diabetes mellitus with obesity (HCC)    Needs flu shot    Thyroid  nodule    Hyperlipidemia associated with type 2 diabetes mellitus (HCC)    Type 2 diabetes mellitus with diabetic nephropathy, without long-term current use of insulin  (HCC)  Statin intolerance    Primary hypertension    Elevated uric acid in blood    History of pulmonary embolism    Hypertension associated with diabetes (HCC)    Sensorineural hearing loss (SNHL), bilateral    Wears partial dentures      This visit was a preventative care visit, also known as wellness visit or routine physical.   Topics typically include healthy lifestyle, diet, exercise, preventative care, vaccinations, sick and well care, proper use of emergency dept and after hours care, as well as other concerns.    Separate significant issues discussed: Diabetes-updated labs today, continue metformin  1000 mg twice daily  Subclinical hypo thyroidism-updated labs today  Thyroid  nodule seen on scan this past fall-updated independent ultrasound thyroid  order placed  Hypertension-controlled on current medicine, continue losartan  HCT 100/12.5 mg daily  Hyperlipidemia-continue fenofibrate  145 mg daily, updated labs today, prior statin intolerance  Elevated uric acid in blood-updated labs today, continue allopurinol  100 mg daily  CKD-updated labs today  Hearing loss-mild hearing loss with screening.  She has seen audiology prior for evaluation  Ongoing ear pressure on the left-begin Polytrim drops as below, low-dose Sudafed short-term for the next 5 to 6 days.  If not improving in the next week will need ENT consult  History of pulmonary embolism September 2024.  She ended up stopping anticoagulation in February 2025 after colonic bleeding that resolved spontaneously by the time colonoscopy was  performed   General Recommendations: Continue to return yearly for your annual wellness and preventative care visits.  This gives us  a chance to discuss healthy lifestyle, exercise, vaccinations, review your chart record, and perform screenings where appropriate.  I recommend you see your eye doctor yearly for routine vision care.  I recommend you see your dentist yearly for routine dental care including hygiene visits twice yearly.   Vaccination recommendations were reviewed Immunization History  Administered Date(s) Administered   Fluad Quad(high Dose 65+) 09/10/2019, 10/06/2020, 08/24/2021, 08/28/2022   Fluad Trivalent(High Dose 65+) 08/14/2023   INFLUENZA, HIGH DOSE SEASONAL PF 10/28/2018, 08/19/2024   Influenza,inj,Quad PF,6+ Mos 09/21/2016, 10/03/2017   Moderna Sars-Covid-2 Vaccination 05/25/2020, 06/22/2020, 12/29/2020, 09/09/2021   PPD Test 02/09/2017   Pneumococcal Conjugate-13 10/28/2018   Pneumococcal Polysaccharide-23 08/01/2006, 01/01/2020   RSV,unspecified 11/14/2022   Td 12/21/2003   Tdap 09/29/2015   Unspecified SARS-COV-2 Vaccination 11/14/2022   Zoster Recombinant(Shingrix) 08/19/2021, 08/21/2023    Vaccine recommendations: Counseled on the influenza virus vaccine.  Vaccine information sheet given.  Influenza vaccine given after consent obtained.    Screening for cancer: Colon cancer screening: Reviewed 01/2024 colonoscopy from hospitalization  Breast cancer screening: You should perform a self breast exam monthly.   We reviewed recommendations for regular mammograms and breast cancer screening. Reviewed mammogram from 11/2023   Skin cancer screening: Check your skin regularly for new changes, growing lesions, or other lesions of concern Come in for evaluation if you have skin lesions of concern.  Lung cancer screening: If you have a greater than 20 pack year history of tobacco use, then you may qualify for lung cancer screening with a chest CT scan.    Please call your insurance company to inquire about coverage for this test.  Pancreatic cancer: no current screening test is available routinely recommended.  (Risk factors: Smoking, overweight or obese, diabetes, chronic pancreatitis, work Nurse, mental health, Solicitor, 52 year old or greater, female greater than female, African-American, family history of pancreatic cancer, hereditary breast, ovarian, melanoma, Lynch, Peutz-jeghers).    Bone health: Get at least 150  minutes of aerobic exercise weekly Get weight bearing exercise at least once weekly Bone density test:  A bone density test is an imaging test that uses a type of X-ray to measure the amount of calcium  and other minerals in your bones. The test may be used to diagnose or screen you for a condition that causes weak or thin bones (osteoporosis), predict your risk for a broken bone (fracture), or determine how well your osteoporosis treatment is working. The bone density test is recommended for females 65 and older, or females or males <65 if certain risk factors such as thyroid  disease, long term use of steroids such as for asthma or rheumatological issues, vitamin D  deficiency, estrogen deficiency, family history of osteoporosis, self or family history of fragility fracture in first degree relative.  Bone density screening: Showing normal bone mass 03/2024 but increased fracture risk   Heart health: Get at least 150 minutes of aerobic exercise weekly Limit alcohol It is important to maintain a healthy blood pressure and healthy cholesterol numbers  Heart disease screening: Screening for heart disease includes screening for blood pressure, fasting lipids, glucose/diabetes screening, BMI height to weight ratio, reviewed of smoking status, physical activity, and diet.    Goals include blood pressure 120/80 or less, maintaining a healthy lipid/cholesterol profile, preventing diabetes or keeping diabetes numbers under good  control, not smoking or using tobacco products, exercising most days per week or at least 150 minutes per week of exercise, and eating healthy variety of fruits and vegetables, healthy oils, and avoiding unhealthy food choices like fried food, fast food, high sugar and high cholesterol foods.    Other tests may possibly include EKG test, CT coronary calcium  score, echocardiogram, exercise treadmill stress test.     Vascular disease screening: For high risk individuals including smokers, diabetes, patients with known heart disease or high blood pressure, kidney disease, and others, screening for vascular disease or atherosclerosis of the arteries is available.  Examples may include carotid ultrasound, abdominal aortic ultrasound, ABI blood flow screening in the legs, thoracic aorta screening.  2022 normal ABI screen reviewed   Medical care options: I recommend you continue to seek care here first for routine care.  We try really hard to have available appointments Monday through Friday daytime hours for sick visits, acute visits, and physicals.  Urgent care should be used for after hours and weekends for significant issues that cannot wait till the next day.  The emergency department should be used for significant potentially life-threatening emergencies.  The emergency department is expensive, can often have long wait times for less significant concerns, so try to utilize primary care, urgent care, or telemedicine when possible to avoid unnecessary trips to the emergency department.  Virtual visits and telemedicine have been introduced since the pandemic started in 2020, and can be convenient ways to receive medical care.  We offer virtual appointments as well to assist you in a variety of options to seek medical care.   Legal Take the time to do a Last Will and Testament, advanced directives including Healthcare Power of Attorney and Living Will documents.  Do not leave your family with burdens  that can be handled ahead of time.   Advanced Directives: I recommend you consider completing a Health Care Power of Attorney and Living Will.   These documents respect your wishes and help alleviate burdens on your loved ones if you were to become terminally ill or be in a position to need those documents enforced.  You can complete Advanced Directives yourself, have them notarized, then have copies made for our office, for you and for anybody you feel should have them in safe keeping.  Or, you can have an attorney prepare these documents.   If you haven't updated your Last Will and Testament in a while, it may be worthwhile having an attorney prepare these documents together and save on some costs.       Spiritual and Emotional Health Keeping a healthy spiritual life can help you better manage your physical health. Your spiritual life can help you to cope with any issues that may arise with your physical health.  Balance can keep us  healthy and help us  to recover.  If you are struggling with your spiritual health there are questions that you may want to ask yourself:  What makes me feel most complete? When do I feel most connected to the rest of the world? Where do I find the most inner strength? What am I doing when I feel whole?  Helpful tips: Being in nature. Some people feel very connected and at peace when they are walking outdoors or are outside. Helping others. Some feel the largest sense of wellbeing when they are of service to others. Being of service can take on many forms. It can be doing volunteer work, being kind to strangers, or offering a hand to a friend in need. Gratitude. Some people find they feel the most connected when they remain grateful. They may make lists of all the things they are grateful for or say a thank you out loud for all they have.    Emotional Health Are you in tune with your emotional health?  Check out this  link: http://www.marquez-love.com/   Financial Health Make sure you use a budget for your personal finances Make sure you are insured against risks (health insurance, life insurance, auto insurance, etc) Save more, spend less Set financial goals If you need help in this area, good resources include counseling through [employer] or other community resources, have a meeting with a Social research officer, government, and a good resource is Deatrice Shoulder podcast    Annelle was seen today for annual exam.  Diagnoses and all orders for this visit:  Encounter for health maintenance examination in adult -     TSH + free T4; Future -     Hemoglobin A1c; Future -     Lipid panel; Future -     Uric acid; Future -     Microalbumin/Creatinine Ratio, Urine; Future -     CBC; Future -     Renal Function Panel; Future -     US  THYROID ; Future  Type 2 diabetes mellitus with obesity (HCC)  Needs flu shot -     Flu vaccine HIGH DOSE PF(Fluzone Trivalent)  Thyroid  nodule -     TSH + free T4; Future -     US  THYROID ; Future  Hyperlipidemia associated with type 2 diabetes mellitus (HCC) -     Lipid panel; Future  Type 2 diabetes mellitus with diabetic nephropathy, without long-term current use of insulin  (HCC) -     Hemoglobin A1c; Future -     Microalbumin/Creatinine Ratio, Urine; Future  Statin intolerance  Primary hypertension  Elevated uric acid in blood -     Uric acid; Future  History of pulmonary embolism  Hypertension associated with diabetes (HCC)  Sensorineural hearing loss (SNHL), bilateral  Wears partial dentures  Other orders -  NEOMYCIN -POLYMYXIN-HYDROCORTISONE (CORTISPORIN) 1 % SOLN OTIC solution; Place 3 drops into the left ear every 8 (eight) hours. -     pseudoephedrine  (SUDAFED) 60 MG tablet; Take 1 tablet (60 mg total) by mouth in the morning and at bedtime.     Follow-up pending labs, yearly for physical

## 2024-08-20 ENCOUNTER — Other Ambulatory Visit

## 2024-08-20 DIAGNOSIS — E041 Nontoxic single thyroid nodule: Secondary | ICD-10-CM

## 2024-08-20 DIAGNOSIS — E785 Hyperlipidemia, unspecified: Secondary | ICD-10-CM | POA: Diagnosis not present

## 2024-08-20 DIAGNOSIS — E1169 Type 2 diabetes mellitus with other specified complication: Secondary | ICD-10-CM

## 2024-08-20 DIAGNOSIS — E1121 Type 2 diabetes mellitus with diabetic nephropathy: Secondary | ICD-10-CM

## 2024-08-20 DIAGNOSIS — E790 Hyperuricemia without signs of inflammatory arthritis and tophaceous disease: Secondary | ICD-10-CM

## 2024-08-20 DIAGNOSIS — Z0000 Encounter for general adult medical examination without abnormal findings: Secondary | ICD-10-CM | POA: Diagnosis not present

## 2024-08-20 LAB — LIPID PANEL

## 2024-08-21 ENCOUNTER — Inpatient Hospital Stay
Admission: RE | Admit: 2024-08-21 | Discharge: 2024-08-21 | Discharge status: Home / Self Care | Source: Ambulatory Visit | Attending: Medical | Admitting: Medical

## 2024-08-21 ENCOUNTER — Ambulatory Visit: Payer: Self-pay | Admitting: Medical

## 2024-08-21 ENCOUNTER — Other Ambulatory Visit: Payer: Self-pay | Admitting: Medical

## 2024-08-21 DIAGNOSIS — Z0000 Encounter for general adult medical examination without abnormal findings: Secondary | ICD-10-CM

## 2024-08-21 DIAGNOSIS — E041 Nontoxic single thyroid nodule: Secondary | ICD-10-CM

## 2024-08-21 DIAGNOSIS — E042 Nontoxic multinodular goiter: Secondary | ICD-10-CM | POA: Diagnosis not present

## 2024-08-21 LAB — RENAL FUNCTION PANEL
Albumin: 4.3 g/dL (ref 3.9–4.9)
BUN/Creatinine Ratio: 22 (ref 12–28)
BUN: 26 mg/dL (ref 8–27)
CO2: 21 mmol/L (ref 20–29)
Calcium: 9.6 mg/dL (ref 8.7–10.3)
Chloride: 100 mmol/L (ref 96–106)
Creatinine, Ser: 1.19 mg/dL — AB (ref 0.57–1.00)
Glucose: 142 mg/dL — AB (ref 70–99)
Phosphorus: 2.7 mg/dL — AB (ref 3.0–4.3)
Potassium: 4.1 mmol/L (ref 3.5–5.2)
Sodium: 138 mmol/L (ref 134–144)
eGFR: 49 mL/min/1.73 — AB (ref >59)

## 2024-08-21 LAB — HEMOGLOBIN A1C
Est. average glucose Bld gHb Est-mCnc: 169 mg/dL
Hgb A1c MFr Bld: 7.5 — AB (ref 4.8–5.6)

## 2024-08-21 LAB — LIPID PANEL
Cholesterol, Total: 175 mg/dL (ref 100–199)
HDL: 40 mg/dL (ref >39)
LDL CALC COMMENT:: 4.4 ratio (ref 0.0–4.4)
LDL Chol Calc (NIH): 110 mg/dL — AB (ref 0–99)
Triglycerides: 137 mg/dL (ref 0–149)
VLDL Cholesterol Cal: 25 mg/dL (ref 5–40)

## 2024-08-21 LAB — URIC ACID: Uric Acid: 5 mg/dL (ref 3.0–7.2)

## 2024-08-21 LAB — MICROALBUMIN / CREATININE URINE RATIO
Creatinine, Urine: 86.2 mg/dL
Microalb/Creat Ratio: 15 mg/g{creat} (ref 0–29)
Microalbumin, Urine: 13.2 ug/mL

## 2024-08-21 LAB — CBC
Hematocrit: 36.8 % (ref 34.0–46.6)
Hemoglobin: 11.6 g/dL (ref 11.1–15.9)
MCH: 30.1 pg (ref 26.6–33.0)
MCHC: 31.5 g/dL (ref 31.5–35.7)
MCV: 96 fL (ref 79–97)
Platelets: 334 x10E3/uL (ref 150–450)
RBC: 3.85 x10E6/uL (ref 3.77–5.28)
RDW: 12.9 % (ref 11.7–15.4)
WBC: 6.8 x10E3/uL (ref 3.4–10.8)

## 2024-08-21 LAB — TSH+FREE T4
Free T4: 1.42 ng/dL (ref 0.82–1.77)
TSH: 3.28 u[IU]/mL (ref 0.450–4.500)

## 2024-08-21 MED ORDER — TIRZEPATIDE 15 MG/0.5ML ~~LOC~~ SOAJ: 15.0000 mg | SUBCUTANEOUS | 1 refills | Status: DC

## 2024-08-21 MED ORDER — METFORMIN HCL 500 MG PO TABS: 500.0000 mg | ORAL_TABLET | Freq: Two times a day (BID) | ORAL | 0 refills | Status: DC

## 2024-08-21 NOTE — Progress Notes (Signed)
  Labs show chronic kidney disease but stable.  Uric acid level okay.  Thyroid  okay.  Cholesterol numbers okay.  Diabetes marker 7.5%.  Blood counts okay.  Still pending microalbumin kidney marker.  Given the kidneys function, I recommend cutting down the metformin  to 500 twice a day.  Since the hemoglobin A1c is not quite to go, we may have to add something else since we are lowering the metformin .  We could either go up to the highest dose of Mounjaro  15 mg, or we could continue Mounjaro  and add something like oral Jardiance daily.  Bernadine can also help protect the kidney as a diabetes medicine.  However it can potentially increase urination or increased risk of vaginal yeast infection.  I may have samples to try if she would be willing to give it a try.  If she would rather just go up on the Mounjaro  to the highest dose we can do this along with the lower dose of metformin   Continue rest of medicines as usual  Let me know what she wants to do about the diabetes numbers above

## 2024-08-21 NOTE — Progress Notes (Signed)
Microalbumin kidney marker normal

## 2024-08-25 ENCOUNTER — Telehealth: Payer: Self-pay | Admitting: Pharmacy Technician

## 2024-08-25 NOTE — Progress Notes (Signed)
   08/25/2024  Patient ID: [name], female   DOB: Nov 08, 1951, 72 y.o.   MRN: [id number]  Patient engaged with clinical pharmacist for management of diabetes on 07/31/2024. Outreach by [employer] technician was requested.   Outreached patient to discuss diabetes medication management. Left voicemail for patient to return my call at their convenience.   Herman Mell, CPhT [facility] Office: [phone] Email: [email or web address]

## 2024-08-26 ENCOUNTER — Telehealth: Payer: Self-pay | Admitting: Medical

## 2024-08-26 NOTE — Telephone Encounter (Signed)
 Patient was identified as falling into the [place] Measure - Diabetes.   Patient was: Appointment scheduled with primary care provider in the next 30 days.  Pt had appt 08/19/24 with PCP & A1c now 7.5

## 2024-08-27 ENCOUNTER — Other Ambulatory Visit: Payer: Self-pay | Admitting: Medical

## 2024-08-27 ENCOUNTER — Telehealth: Payer: Self-pay | Admitting: Pharmacy Technician

## 2024-08-27 DIAGNOSIS — E041 Nontoxic single thyroid nodule: Secondary | ICD-10-CM

## 2024-08-27 NOTE — Telephone Encounter (Signed)
 Placed order

## 2024-08-27 NOTE — Progress Notes (Signed)
 Thyroid  ultrasound shows several nodules and 1 is recommended to have a biopsy.  If agreeable lets set her up for biopsy through interventional radiology

## 2024-08-27 NOTE — Progress Notes (Addendum)
   08/27/2024  Patient ID: [name], female   DOB: [birth year], 73 y.o.   MRN: [id number]  Patient engaged with clinical pharmacist for management of diabetes on 07/31/2024. Outreach by [employer] technician was requested.   Outreached patient to discuss diabetes medication management. Left voicemail for patient to return my call at their convenience.   ADDENDUM 09/16/2024 12:06pm Patient returned the call.  Patient is appearing on a report for [facility] [place] [facility] and last engaged with the clinical pharmacist to discuss diabetes on 07/31/2024. Contacted patient today to discuss diabetes management and completed medication review.   Diabetes Plan from last clinical pharmacist appointment:  Diabetes: - Currently uncontrolled - Reviewed long term cardiovascular and renal outcomes of uncontrolled blood sugar - Reviewed goal A1c, goal fasting, and goal 2 hour post prandial glucose - Reviewed dietary modifications including low carb diet. Recommended sugar free creamer, patient declines -Continue current medication therapy - Patient denies personal or family history of multiple endocrine neoplasia type 2, medullary thyroid  cancer; personal history of pancreatitis or gallbladder disease. - Recommend to check glucose daily at varying times, including post-prandial Counseled on what it means to obtain a truly fasting sugar and goals - Future consideration - Maximize mounjaro  to 15mg  at next follow up if sugars remain elevated. if A1C/sugars remain elevated on max Mounjaro , could consider addition of SGLT2 -Update lipid panel at next visit to assess LDL, unable to tolerate statin, may need zetia or repatha if elevated   Medication Adherence Barriers Identified:  Patient made recommended medication changes per plan: Yes Patient has been taking Mounjaro  12.5mg  weekly and Metformin  1000mg  bid. Patient had labs done on 08/20/2024. Patient's A1C at that time was 7.5. Patient was  notified of test results on 08/21/24 and informed she would like to increase the Mounjaro  dose to 15mg  weekly. MD also decreased Metformin  dose to 500mg  bid. Patient informs she plans to start both of these new doses tomorrow as she just received them from mail order pharmacy. Access issues with any new medication or testing device: No Patient reports no cost concerns. Per Dr Annemarie, Mounjaro  15mg  and Metformin  500mg  were both filled on 08/21/24.  Patient is checking blood sugars as prescribed: No Patient informs she is only checking blood sugar once per week. She informs on Saturday her blood sugar was 116. She informs she can tell if her blood sugar is high. She informs her blood sugar usually is around 140 with the highest being 160. Patient informs on a recent thyroid  ultrasound that several nodules were found and she has to have a biopsy. She informs they have not reached out to schedule this yet. Informed patient this information would be reported to PharmD.  Medication Adherence Barriers Addressed/Actions Taken:  Reviewed medication changes per plan from last clinical pharmacist note Educated patient to contact pharmacy regarding new prescriptions Reviewed instructions for monitoring blood sugars at home and reminded patient to keep a written log to review with pharmacist Reminded patient of date/time of upcoming clinical pharmacist follow up and any upcoming PCP/specialists visits. Patient denies transportation barriers to the appointment. Yes  Next clinical pharmacist appointment is scheduled for: 09/18/2024    Derhonda Eastlick, CPhT [facility] Office: [phone] Email: [email or web address]

## 2024-09-15 ENCOUNTER — Ambulatory Visit
Admission: RE | Admit: 2024-09-15 | Discharge: 2024-09-15 | Disposition: A | Discharge status: Home / Self Care | Source: Ambulatory Visit | Attending: Medical | Admitting: Medical

## 2024-09-15 ENCOUNTER — Other Ambulatory Visit (HOSPITAL_COMMUNITY)
Admission: RE | Admit: 2024-09-15 | Discharge: 2024-09-15 | Disposition: A | Discharge status: Home / Self Care | Source: Ambulatory Visit | Attending: Medical | Admitting: Medical

## 2024-09-15 ENCOUNTER — Encounter: Payer: Self-pay | Admitting: Hematology

## 2024-09-15 DIAGNOSIS — E041 Nontoxic single thyroid nodule: Secondary | ICD-10-CM | POA: Insufficient documentation

## 2024-09-15 DIAGNOSIS — E042 Nontoxic multinodular goiter: Secondary | ICD-10-CM | POA: Diagnosis not present

## 2024-09-15 NOTE — Telephone Encounter (Signed)
 Left message on pt answer machine

## 2024-09-16 ENCOUNTER — Telehealth: Payer: Self-pay | Admitting: Internal Medicine

## 2024-09-16 ENCOUNTER — Ambulatory Visit: Payer: Self-pay | Admitting: Medical

## 2024-09-16 LAB — CYTOLOGY - NON PAP

## 2024-09-16 MED ORDER — BLOOD GLUCOSE TEST VI STRP: ORAL_STRIP | 0 refills | Status: DC

## 2024-09-16 MED ORDER — LANCETS MISC. MISC: 0 refills | Status: AC

## 2024-09-16 MED ORDER — LANCET DEVICE MISC: 0 refills | Status: AC

## 2024-09-16 MED ORDER — BLOOD GLUCOSE MONITORING SUPPL DEVI: 0 refills | Status: AC

## 2024-09-16 NOTE — Progress Notes (Signed)
 Make sure she is aware they are going to call her back to reattempt biopsy as they did not get enough tissue

## 2024-09-16 NOTE — Telephone Encounter (Signed)
 refilled  Copied from CRM #[id number]. Topic: Clinical - Prescription Issue >> Sep 16, 2024  3:21 PM Berwyn MATSU wrote: Reason for CRM: patient called in requesting for a glucose meter to be order along with test strips because the one she has are not compatible with the test strips and lancets she has.   May you please assist.

## 2024-09-17 ENCOUNTER — Ambulatory Visit: Payer: Self-pay | Admitting: Medical

## 2024-09-17 NOTE — Progress Notes (Signed)
 This is a second result sent from pathology.  I am assuming they did a different biopsy since the last note showed the previous attempt did not get enough tissue.   this one shows benign follicular tissue and thankfully no worrisome findings  Did she end up going back a second time or if they take multiple samples on the first time?

## 2024-09-18 ENCOUNTER — Telehealth: Payer: Self-pay

## 2024-09-18 ENCOUNTER — Other Ambulatory Visit (INDEPENDENT_AMBULATORY_CARE_PROVIDER_SITE_OTHER)

## 2024-09-18 DIAGNOSIS — E1121 Type 2 diabetes mellitus with diabetic nephropathy: Secondary | ICD-10-CM

## 2024-09-18 NOTE — Progress Notes (Signed)
 09/18/2024 Name: [name] MRN: [id number] DOB: [birth year]  Chief Complaint  Patient presents with   Medication Management   Diabetes   Hyperlipidemia    [name] is a 73 y.o. year old female who presented for a telephone visit.   They were referred to the pharmacist for assistance in managing diabetes.    Subjective:  Care Team: Primary Care Provider: Bulah Alm GORMAN RIGGERS ;   Medication Access/Adherence  Current Pharmacy:  [facility] Delivery) - [place], [place] - [address] [facility] [address] [place] [address] [place] [address] Phone: [phone] Fax: [phone]  [facility] Delivery - [place], [place] - [address] [place] [address] Phone: [phone] Fax: [phone]   Patient reports affordability concerns with their medications: No  Patient reports access/transportation concerns to their pharmacy: No  Patient reports adherence concerns with their medications:  No     Diabetes:  Current medications: Mounjaro  15 mg on Thursdays, Metformin  500mg  BID Medications tried in the past: Glimepiride , Tradjenta , Ozempic   Current glucose readings:  Has not been able to check the last few days, her meter stopped working, pharmacy is in process of shipping her a new one. She reports last week they were ranging from 95-107 fasting since increasing Mounjaro   Does have thyroid  biopsy scheduled   Patient denies hypoglycemic s/sx including dizziness, shakiness, sweating. Patient denies hyperglycemic symptoms including polyuria, polydipsia, polyphagia, nocturia, neuropathy, blurred vision.  Current meal patterns:  States mounjaro  is really helping with appetite and food cravings and that she has worked hard to limit her carbs. No longer eating a snack late at night  Current physical activity: tries to exercise a little each day most days  Current medication access support:  None   Objective:  Lab Results  Component Value Date   HGBA1C 7.5 (H) 08/20/2024    Lab Results  Component Value Date   CREATININE 1.19 (H) 08/20/2024   BUN 26 08/20/2024   NA 138 08/20/2024   K 4.1 08/20/2024   CL 100 08/20/2024   CO2 21 08/20/2024    Lab Results  Component Value Date   CHOL 175 08/20/2024   HDL 40 08/20/2024   LDLCALC 110 (H) 08/20/2024   TRIG 137 08/20/2024   CHOLHDL 4.4 08/20/2024    Medications Reviewed Today     Reviewed by Lionell Jon DEL, RPH (Pharmacist) on 09/18/24 at 1544  Med List Status: <None>   Medication Order Taking? Sig Documenting Provider Last Dose Status Informant  allopurinol  (ZYLOPRIM ) 100 MG tablet [id number]  Take 1 tablet (100 mg total) by mouth daily. Bulah Alm GORMAN, PA-C  Active   Blood Glucose Monitoring Suppl DEVI [id number]  Test 1-2 times day. Pend on Insurance dx E11.9 Bulah Alm GORMAN, PA-C  Active   Cyanocobalamin  (VITAMIN B-12 PO) [id number]  Take 1 tablet by mouth daily at 2 PM. [provider]  Active Self, Pharmacy Records  fenofibrate  (TRICOR ) 145 MG tablet [id number]  TAKE 1 TABLET BY MOUTH DAILY Tysinger, Alm GORMAN, PA-C  Active   fluticasone  (FLONASE ) 50 MCG/ACT nasal spray [id number]  Place 2 sprays into both nostrils daily. Bulah Alm GORMAN, PA-C  Active   Glucose Blood (BLOOD GLUCOSE TEST STRIPS) STRP [id number]  Test 1-2 times daily Bulah Alm GORMAN RIGGERS  Active   Lancet Device MISC [id number]  1-2 times daily Tysinger, Alm GORMAN, PA-C  Active   Lancets Misc. MISC [id number]  1-2 times a day Bulah Alm RAMAN, PA-C  Active   losartan -hydrochlorothiazide  (HYZAAR) 100-12.5 MG tablet [id number]  TAKE 1 TABLET BY MOUTH DAILY Tysinger, David S, PA-C  Active   MAGNESIUM  CITRATE PO [id number]  Take 1 tablet by mouth daily as needed. [provider]  Active Self, Pharmacy Records  metFORMIN  (GLUCOPHAGE ) 500 MG tablet [id number]  Take 1 tablet (500 mg total) by mouth 2 (two) times daily with a meal.  Tysinger, Alm RAMAN, PA-C  Active   Multiple Vitamin (MULTIVITAMIN) tablet [id number]  Take 1 tablet by mouth daily. [provider]  Active Self, Pharmacy Records  NEOMYCIN -POLYMYXIN-HYDROCORTISONE (CORTISPORIN) 1 % SOLN OTIC solution [id number]  Place 3 drops into the left ear every 8 (eight) hours. Tysinger, Alm RAMAN, PA-C  Active   pseudoephedrine  (SUDAFED) 60 MG tablet [id number]  Take 1 tablet (60 mg total) by mouth in the morning and at bedtime. Tysinger, Alm RAMAN, PA-C  Active   tirzepatide  (MOUNJARO ) 15 MG/0.5ML Pen [id number]  Inject 15 mg into the skin once a week. Tysinger, Alm RAMAN, PA-C  Active               Assessment/Plan:   Diabetes: - Currently uncontrolled - Reviewed long term cardiovascular and renal outcomes of uncontrolled blood sugar - Reviewed goal A1c, goal fasting, and goal 2 hour post prandial glucose - Reviewed dietary modifications including low carb diet. Recommended sugar free creamer, patient declines -Continue current medication therapy - Patient denies personal or family history of multiple endocrine neoplasia type 2, medullary thyroid  cancer; personal history of pancreatitis or gallbladder disease. - Recommend to check glucose daily at varying times, including post-prandial Counseled on what it means to obtain a truly fasting sugar and goals - Future consideration - could consider addition of SGLT2 if next A1C elevated, may need to consider alternative to Mounjaro  pending thyroid  biopsy results if not benign -Patient declines additional med therapy to address elevated LDL, counseled on limiting fried/fatty foods.    Follow Up Plan: 10/16/24  Jon VEAR Lindau, PharmD Clinical Pharmacist [phone]

## 2024-09-18 NOTE — Telephone Encounter (Signed)
 Error

## 2024-09-20 ENCOUNTER — Other Ambulatory Visit: Payer: Self-pay | Admitting: Medical

## 2024-09-20 DIAGNOSIS — E790 Hyperuricemia without signs of inflammatory arthritis and tophaceous disease: Secondary | ICD-10-CM

## 2024-09-26 ENCOUNTER — Other Ambulatory Visit (HOSPITAL_COMMUNITY)
Admission: RE | Admit: 2024-09-26 | Discharge: 2024-09-26 | Disposition: A | Discharge status: Home / Self Care | Source: Ambulatory Visit | Attending: Medical | Admitting: Medical

## 2024-09-26 ENCOUNTER — Ambulatory Visit
Admission: RE | Admit: 2024-09-26 | Discharge: 2024-09-26 | Disposition: A | Discharge status: Home / Self Care | Source: Ambulatory Visit | Attending: Medical | Admitting: Medical

## 2024-09-26 DIAGNOSIS — E079 Disorder of thyroid, unspecified: Secondary | ICD-10-CM | POA: Diagnosis not present

## 2024-09-26 DIAGNOSIS — E041 Nontoxic single thyroid nodule: Secondary | ICD-10-CM | POA: Insufficient documentation

## 2024-09-30 LAB — CYTOLOGY - NON PAP

## 2024-10-01 ENCOUNTER — Other Ambulatory Visit: Payer: Self-pay | Admitting: Internal Medicine

## 2024-10-01 ENCOUNTER — Ambulatory Visit: Payer: Self-pay | Admitting: Medical

## 2024-10-01 DIAGNOSIS — E041 Nontoxic single thyroid nodule: Secondary | ICD-10-CM

## 2024-10-01 NOTE — Progress Notes (Signed)
 Starts results show  atypical cells.  Given this finding I would recommend we refer her to endocrinology to let them monitor this.  Please refer to endocrinology

## 2024-10-08 LAB — OPHTHALMOLOGY REPORT-SCANNED

## 2024-10-09 DIAGNOSIS — E041 Nontoxic single thyroid nodule: Secondary | ICD-10-CM | POA: Diagnosis not present

## 2024-10-13 ENCOUNTER — Ambulatory Visit: Payer: Self-pay | Admitting: Medical

## 2024-10-13 NOTE — Progress Notes (Signed)
 Call patient.  There was some additional testing done as part of her biopsies of the thyroid .  The testing suggest that she has a lower probability of either thyroid  tissue being worrisome of cancer.  I have referred her to endocrinology.  I would still recommend she see them.  I do not know exactly how many times that they went back in and did additional biopsies but she needs to explain that to the endocrinologist just so they are aware of of what was done.

## 2024-10-14 ENCOUNTER — Encounter (HOSPITAL_COMMUNITY): Payer: Self-pay

## 2024-10-16 ENCOUNTER — Other Ambulatory Visit

## 2024-10-16 DIAGNOSIS — E1121 Type 2 diabetes mellitus with diabetic nephropathy: Secondary | ICD-10-CM

## 2024-10-16 NOTE — Progress Notes (Signed)
 10/16/2024 Name: FILICIA SCOGIN MRN: [id number] DOB: 1951-08-14  Chief Complaint  Patient presents with   Medication Management   Diabetes    Rudene Poulsen Tan is a 73 y.o. year old female who presented for a telephone visit.   They were referred to the pharmacist for assistance in managing diabetes.    Subjective:  Care Team: Primary Care Provider: Bulah Alm GORMAN DEVONNA ;   Medication Access/Adherence  Current Pharmacy:  [facility] Delivery) - [place], [place] - [address] [facility] [address] [place] [address] [place] [address] Phone: [phone] Fax: [phone]  [facility] Delivery - [place], [place] - [address] [place] [address] Phone: [phone] Fax: [phone]   Patient reports affordability concerns with their medications: No  Patient reports access/transportation concerns to their pharmacy: No  Patient reports adherence concerns with their medications:  No     Diabetes:  Current medications: Mounjaro  15 mg on Thursdays, Metformin  500mg  BID Medications tried in the past: Glimepiride , Tradjenta , Ozempic   Current glucose readings:  Reports her fasting was 185 this morning, not sure why elevated Has seen numbers under 140  Has follow up with endo to discuss results of thyroid  biopsy   Patient denies hypoglycemic s/sx including dizziness, shakiness, sweating. Patient denies hyperglycemic symptoms including polyuria, polydipsia, polyphagia, nocturia, neuropathy, blurred vision.  Current meal patterns:  States mounjaro  is really helping with appetite and food cravings and that she has worked hard to limit her carbs. No longer eating a snack late at night  Current physical activity: tries to exercise a little each day most days  Current medication access support: None  Does make note that she has continued to have noise on and off in her left ear, is requesting a referral to  ENT  Objective:  Lab Results  Component Value Date   HGBA1C 7.5 (H) 08/20/2024    Lab Results  Component Value Date   CREATININE 1.19 (H) 08/20/2024   BUN 26 08/20/2024   NA 138 08/20/2024   K 4.1 08/20/2024   CL 100 08/20/2024   CO2 21 08/20/2024    Lab Results  Component Value Date   CHOL 175 08/20/2024   HDL 40 08/20/2024   LDLCALC 110 (H) 08/20/2024   TRIG 137 08/20/2024   CHOLHDL 4.4 08/20/2024    Medications Reviewed Today     Reviewed by Lionell Jon DEL, RPH (Pharmacist) on 10/16/24 at 1550  Med List Status: <None>   Medication Order Taking? Sig Documenting Provider Last Dose Status Informant  allopurinol  (ZYLOPRIM ) 100 MG tablet [id number] Yes TAKE 1 TABLET BY MOUTH DAILY Bulah Alm GORMAN DEVONNA  Active   Blood Glucose Monitoring Suppl DEVI [id number]  Test 1-2 times day. Pend on Insurance dx E11.9 Tysinger, David S, PA-C  Active   Cyanocobalamin  (VITAMIN B-12 PO) [id number] Yes Take 1 tablet by mouth daily at 2 PM. [provider]  Active Self, Pharmacy Records  fenofibrate  (TRICOR ) 145 MG tablet [id number] Yes TAKE 1 TABLET BY MOUTH DAILY Tysinger, Alm GORMAN, PA-C  Active   fluticasone  (FLONASE ) 50 MCG/ACT nasal spray [id number] Yes Place 2 sprays into both nostrils daily. Bulah Alm GORMAN, PA-C  Active   Glucose Blood (BLOOD GLUCOSE TEST STRIPS) STRP [id number] Yes Test 1-2 times daily Bulah Alm GORMAN DEVONNA  Active   Lancet Device MISC [id number]  1-2 times daily Tysinger, Alm GORMAN, PA-C  Active   Lancets Misc. MISC  [id number]  1-2 times a day Bulah Alm GORMAN DEVONNA  Active   losartan -hydrochlorothiazide  (HYZAAR) 100-12.5 MG tablet [id number] Yes TAKE 1 TABLET BY MOUTH DAILY Tysinger, David S, PA-C  Active   MAGNESIUM  CITRATE PO [id number]  Take 1 tablet by mouth daily as needed. [provider]  Active Self, Pharmacy Records  metFORMIN  (GLUCOPHAGE ) 500 MG tablet [id number] Yes Take 1 tablet (500 mg total) by mouth 2 (two) times daily with a meal.  Tysinger, Alm GORMAN, PA-C  Active   Multiple Vitamin (MULTIVITAMIN) tablet [id number]  Take 1 tablet by mouth daily. [provider]  Active Self, Pharmacy Records  NEOMYCIN -POLYMYXIN-HYDROCORTISONE (CORTISPORIN) 1 % SOLN OTIC solution [id number]  Place 3 drops into the left ear every 8 (eight) hours. Tysinger, Alm GORMAN, PA-C  Active   pseudoephedrine  (SUDAFED) 60 MG tablet [id number]  Take 1 tablet (60 mg total) by mouth in the morning and at bedtime. Tysinger, Alm GORMAN, PA-C  Active   tirzepatide  (MOUNJARO ) 15 MG/0.5ML Pen [id number] Yes Inject 15 mg into the skin once a week. Tysinger, Alm GORMAN, PA-C  Active               Assessment/Plan:   Diabetes: - Currently uncontrolled but improving - Reviewed long term cardiovascular and renal outcomes of uncontrolled blood sugar - Reviewed goal A1c, goal fasting, and goal 2 hour post prandial glucose - Reviewed dietary modifications including low carb diet. Recommended sugar free creamer, patient declines -Continue current medication therapy - Patient denies personal or family history of multiple endocrine neoplasia type 2, medullary thyroid  cancer; personal history of pancreatitis or gallbladder disease. - Recommend to check glucose daily at varying times, including post-prandial Counseled on what it means to obtain a truly fasting sugar and goals - Future consideration - could consider addition of SGLT2 if next A1C elevated, may need to consider alternative to Mounjaro  pending f/u with endo -Patient declines additional med therapy to address elevated LDL, counseled on limiting fried/fatty foods.    Follow Up Plan: 1 month  Jon VEAR Lindau, PharmD Clinical Pharmacist [phone]

## 2024-10-17 ENCOUNTER — Other Ambulatory Visit: Payer: Self-pay | Admitting: Medical

## 2024-10-17 DIAGNOSIS — H9190 Unspecified hearing loss, unspecified ear: Secondary | ICD-10-CM

## 2024-10-17 DIAGNOSIS — H938X9 Other specified disorders of ear, unspecified ear: Secondary | ICD-10-CM

## 2024-10-21 ENCOUNTER — Encounter (INDEPENDENT_AMBULATORY_CARE_PROVIDER_SITE_OTHER): Payer: Self-pay

## 2024-10-21 ENCOUNTER — Ambulatory Visit (INDEPENDENT_AMBULATORY_CARE_PROVIDER_SITE_OTHER): Admitting: Medical

## 2024-10-21 VITALS — BP 110/70 | HR 84 | Temp 97.3°F | Wt 299.2 lb

## 2024-10-21 DIAGNOSIS — H938X3 Other specified disorders of ear, bilateral: Secondary | ICD-10-CM

## 2024-10-21 DIAGNOSIS — B370 Candidal stomatitis: Secondary | ICD-10-CM

## 2024-10-21 DIAGNOSIS — H9190 Unspecified hearing loss, unspecified ear: Secondary | ICD-10-CM | POA: Diagnosis not present

## 2024-10-21 DIAGNOSIS — K146 Glossodynia: Secondary | ICD-10-CM

## 2024-10-21 MED ORDER — FLUCONAZOLE 100 MG PO TABS: 100.0000 mg | ORAL_TABLET | Freq: Every day | ORAL | 0 refills | Status: AC

## 2024-10-21 NOTE — Progress Notes (Signed)
 Subjective:  [name] is a 73 y.o. female who presents for Chief Complaint  Patient presents with   Acute Visit    Tongue burns when eating and white film on it. Been going on a couple weeks     [name] is a 73 year old female with diabetes who presents with a burning sensation on her tongue.  She has been experiencing a burning sensation on her tongue, particularly when eating, for the past two weeks. The burning has decreased somewhat over time. In the mornings, she notices a white coating on her tongue.  She has upper dentures, which she cleans daily. No recent use of inhalers or steroids. She experiences xerostomia and frequently drinks water. She does not drink soda. She denies a lot of acidic foods.  Her current medications include Mounjaro  15 mg and metformin , taken twice daily. She also uses Flonase  intermittently, as she feels it affects her taste buds. She takes a B12 supplement and a regular multivitamin. Her last A1c in September was 7.5. Her tongue feels better compared to two weeks ago.  No other aggravating or relieving factors.    No other c/o.  Past Medical History:  Diagnosis Date   Abdominal hernia    Acute blood loss as cause of postoperative anemia 02/16/2017   Allergy     Arthritis    knees, back, R ankle    Bilateral carpal tunnel syndrome 04/29/2012   Diabetes mellitus    Diabetes mellitus type 2 in obese 12/31/2015   Diverticulitis    Edema    legs   GERD (gastroesophageal reflux disease)    HOH (hard of hearing)    Hypertension    Hypertriglyceridemia 08/30/2015   Started on medication 08/30/15   Lipoma 09/30/2012   Obesity    morbid obesity   OBESITY, MORBID 10/16/2007   Qualifier: Diagnosis of  By: Candia Moats     Osteoarthrosis involving lower leg 10/16/2007   Qualifier: Diagnosis of  By: Candia Moats     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     fenofibrate  (TRICOR ) 145 MG tablet TAKE 1 TABLET BY MOUTH DAILY 100 tablet 1   fluticasone  (FLONASE ) 50 MCG/ACT nasal spray Place 2 sprays into both nostrils daily. 16 mL 1   losartan -hydrochlorothiazide  (HYZAAR) 100-12.5 MG tablet TAKE 1 TABLET BY MOUTH DAILY 100 tablet 2   MAGNESIUM  CITRATE PO Take 1 tablet by mouth daily as needed.     metFORMIN  (GLUCOPHAGE ) 500 MG tablet Take 1 tablet (500 mg total) by mouth 2 (two) times daily with a meal. 180 tablet 0   Multiple Vitamin (MULTIVITAMIN) tablet Take 1 tablet by mouth daily.     NEOMYCIN -POLYMYXIN-HYDROCORTISONE (CORTISPORIN) 1 % SOLN OTIC solution Place 3 drops into the left ear every 8 (eight) hours. 10 mL 0   tirzepatide  (MOUNJARO ) 15 MG/0.5ML Pen Inject 15 mg into the skin once a week. 6 mL 1   Blood Glucose Monitoring Suppl DEVI Test 1-2 times day. Pend on Insurance dx E11.9 1 each 0   Glucose Blood (  BLOOD GLUCOSE TEST STRIPS) STRP Test 1-2 times daily 100 strip 0   Lancet Device MISC 1-2 times daily 1 each 0   Lancets Misc. MISC 1-2 times a day 100 each 0   No current facility-administered medications on file prior to visit.     The following portions of the patient's history were reviewed and updated as appropriate: allergies, current medications, past family history, past medical history, past social history, past surgical history and problem list.  ROS Otherwise as in subjective above    Objective: BP 110/70   Pulse 84   Temp (!) 97.3 F (36.3 C)   Wt 299 lb 3.2 oz (135.7 kg)   LMP  (LMP Unknown)   BMI 46.86 kg/m   General appearance: alert, no distress, well developed, well  nourished HEENT: normocephalic, sclerae anicteric, conjunctiva pink and moist, TMs pearly, nares patent, no discharge or erythema, pharynx normal Oral cavity: MMM, whitish film on tongue, geographic tongue findings, otherwise no lesion, upper dentures    Assessment: Encounter Diagnoses  Name Primary?   Tongue sore Yes   Thrush    Pressure sensation in both ears    Decreased hearing, unspecified laterality      Plan: Mouth and tongue soreness Possible thrush Begin Diflucan  oral antifungal pill daily for  1 week In the event mounjaro  is causing problems with dry mouth and current symptom, lets modify the mounjaro  dose. For the next month or 2, change the Mounjaro  15mg  to every 10-14 day instead of every 7 days.      Ear pressure, possible hearing loss I am placing new referral today to ENT ear nose and throat specialist I regards to ear pressure and changes   [name] was seen today for acute visit.  Diagnoses and all orders for this visit:  Tongue sore  Thrush  Pressure sensation in both ears -     Ambulatory referral to ENT  Decreased hearing, unspecified laterality -     Ambulatory referral to ENT  Other orders -     fluconazole  (DIFLUCAN ) 100 MG tablet; Take 1 tablet (100 mg total) by mouth daily.    Follow up: prn

## 2024-10-21 NOTE — Patient Instructions (Addendum)
 Mouth and tongue soreness Possible thrush Begin Diflucan  oral antifungal pill daily for  1 week In the event mounjaro  is causing problems with dry mouth and current symptom, lets modify the mounjaro  dose. For the next month or 2, change the Mounjaro  15mg  to every 10-14 day instead of every 7 days.      Ear pressure, possible hearing loss I am placing new referral today to ENT ear nose and throat specialist I regards to ear pressure and changes

## 2024-10-22 ENCOUNTER — Other Ambulatory Visit: Payer: Self-pay | Admitting: Medical

## 2024-11-03 ENCOUNTER — Telehealth (INDEPENDENT_AMBULATORY_CARE_PROVIDER_SITE_OTHER): Payer: Self-pay

## 2024-11-03 ENCOUNTER — Other Ambulatory Visit: Payer: Self-pay | Admitting: Medical

## 2024-11-03 DIAGNOSIS — Z1231 Encounter for screening mammogram for malignant neoplasm of breast: Secondary | ICD-10-CM

## 2024-11-03 NOTE — Telephone Encounter (Signed)
 Patient called and left voicemail wanting to get scheduled. Referral for us  is in the system. Requesting call back.

## 2024-12-01 ENCOUNTER — Other Ambulatory Visit: Payer: Self-pay | Admitting: Medical

## 2024-12-04 ENCOUNTER — Telehealth: Payer: Self-pay

## 2024-12-04 ENCOUNTER — Other Ambulatory Visit

## 2024-12-04 NOTE — Progress Notes (Signed)
° °  12/04/2024  Patient ID: [name], female   DOB: June 21, 1951, 73 y.o.   MRN: [id number]  Attempted to contact patient for scheduled appointment for medication management. Left HIPAA compliant message for patient to return my call at their convenience.   Jon VEAR Lindau, PharmD Clinical Pharmacist [phone]

## 2024-12-05 ENCOUNTER — Other Ambulatory Visit: Payer: Self-pay

## 2024-12-05 DIAGNOSIS — E1121 Type 2 diabetes mellitus with diabetic nephropathy: Secondary | ICD-10-CM

## 2024-12-05 NOTE — Progress Notes (Signed)
" ° °  12/05/2024 Name: [name] MRN: [id number] DOB: August 09, 1951  Chief Complaint  Patient presents with   Medication Management   Diabetes    [name] is a 73 y.o. year old female who presented for a telephone visit.   They were referred to the pharmacist for assistance in managing diabetes.    Subjective:  Care Team: Primary Care Provider: Bulah Alm GORMAN RIGGERS ;   Medication Access/Adherence  Current Pharmacy:  [facility]) - [place], [place] - [address] [facility] [address] [place] [address] [place] [address] Phone: [phone] Fax: [phone]  [facility] - [place] - [address] [place] [address] Phone: [phone] Fax: [phone]   Patient reports affordability concerns with their medications: No  Patient reports access/transportation concerns to their pharmacy: No  Patient reports adherence concerns with their medications:  No     Diabetes:  Current medications: Mounjaro  15 mg EVERY 10 DAYS, Metformin  500mg  BID Medications tried in the past: Glimepiride , Tradjenta , Ozempic   Current glucose readings:  Reports she is checking post-prandial. Last couple weeks they have been staying under 200   Patient denies hypoglycemic s/sx including dizziness, shakiness, sweating. Patient denies hyperglycemic symptoms including polyuria, polydipsia, polyphagia, nocturia, neuropathy, blurred vision.  Current meal patterns:  States mounjaro  is really helping with appetite and food cravings and that she has worked hard to limit her carbs. No longer eating a snack late at night  Reports she still has spots on her tongue some days but has mostly resolved, she prefers to wait another week or so before following back up  Current physical activity: tries to exercise a little each day most days  Current medication access support: None   Objective:  Lab Results   Component Value Date   HGBA1C 7.5 (H) 08/20/2024    Lab Results  Component Value Date   CREATININE 1.19 (H) 08/20/2024   BUN 26 08/20/2024   NA 138 08/20/2024   K 4.1 08/20/2024   CL 100 08/20/2024   CO2 21 08/20/2024    Lab Results  Component Value Date   CHOL 175 08/20/2024   HDL 40 08/20/2024   LDLCALC 110 (H) 08/20/2024   TRIG 137 08/20/2024   CHOLHDL 4.4 08/20/2024    Medications Reviewed Today   Medications were not reviewed in this encounter       Assessment/Plan:   Diabetes: - Currently uncontrolled but improving - Reviewed long term cardiovascular and renal outcomes of uncontrolled blood sugar - Reviewed goal A1c, goal fasting, and goal 2 hour post prandial glucose - Reviewed dietary modifications including low carb diet. Recommended sugar free creamer, patient declines -Continue current medication therapy - Patient denies personal or family history of multiple endocrine neoplasia type 2, medullary thyroid  cancer; personal history of pancreatitis or gallbladder disease. - Recommend to check glucose daily at varying times, including post-prandial Counseled on what it means to obtain a truly fasting sugar and goals - Future consideration - could consider addition of SGLT2 if next A1C elevated, may need to consider alternative to Mounjaro  pending f/u with endo -Patient declines additional med therapy to address elevated LDL, counseled on limiting fried/fatty foods.    Follow Up Plan: 1 month  Jon VEAR Lindau, PharmD Clinical Pharmacist [phone]  "

## 2024-12-16 ENCOUNTER — Telehealth: Payer: Self-pay | Admitting: Internal Medicine

## 2024-12-16 NOTE — Telephone Encounter (Signed)
 Copied from CRM #[id number]. Topic: Referral - Question >> Dec 16, 2024  1:56 PM Everette C wrote: Reason for CRM: The patient has been in contact with a thyroid  specialist and shares that they were unaware they were going to be contacted. The patient would like to speak with a member of clinical staff about the referral when possible and confirm with their PCP that they need additional follow up.

## 2024-12-16 NOTE — Telephone Encounter (Signed)
 Pt is aware that referral to endocrinologist was made and she can call them back to schedule a visit

## 2024-12-19 ENCOUNTER — Ambulatory Visit
Admission: RE | Admit: 2024-12-19 | Discharge: 2024-12-19 | Disposition: A | Discharge status: Home / Self Care | Source: Ambulatory Visit | Attending: Medical | Admitting: Medical

## 2024-12-19 DIAGNOSIS — Z1231 Encounter for screening mammogram for malignant neoplasm of breast: Secondary | ICD-10-CM

## 2024-12-23 ENCOUNTER — Ambulatory Visit (INDEPENDENT_AMBULATORY_CARE_PROVIDER_SITE_OTHER): Admitting: Physician Assistant

## 2024-12-23 ENCOUNTER — Encounter (INDEPENDENT_AMBULATORY_CARE_PROVIDER_SITE_OTHER): Payer: Self-pay | Admitting: Physician Assistant

## 2024-12-23 VITALS — BP 120/64 | HR 104 | Ht 67.0 in | Wt 280.0 lb

## 2024-12-23 DIAGNOSIS — H9192 Unspecified hearing loss, left ear: Secondary | ICD-10-CM | POA: Diagnosis not present

## 2024-12-23 DIAGNOSIS — H9312 Tinnitus, left ear: Secondary | ICD-10-CM | POA: Diagnosis not present

## 2024-12-24 NOTE — Progress Notes (Signed)
 Dear Dr. Bulah, Here is my assessment for our mutual patient, [name]. Thank you for allowing me the opportunity to care for your patient. Please do not hesitate to contact me should you have any other questions. Sincerely, Chyrl Cohen PA-C  Otolaryngology Clinic Note Referring provider: Dr. Bulah HPI:  [name] is a 74 y.o. female kindly referred by Dr. Bulah   Discussed the use of AI scribe software for clinical note transcription with the patient, who gave verbal consent to proceed.  History of Present Illness    [name] is a 74 year old female who presents with intermittent left-sided tinnitus and hearing loss.  She reports intermittent tinnitus in the left ear, initially described as a pulsatile, heartbeat-like sound, now characterized as a whooshing noise. The tinnitus fluctuates in intensity and occasionally becomes louder, but is not constant. It no longer sounds like a heartbeat and is not rhythmic.   She experiences intermittent left-sided hearing loss described as muffled hearing, accompanied by episodic aural fullness or clogging. No persistent or continuous symptoms are present.  She has longstanding sinonasal symptoms, which she describes as allergies, and uses intranasal fluticasone  intermittently. She does not use antihistamines.  She underwent nasal surgery several years ago for recurrent epistaxis.         Independent Review of Additional Tests or Records:  none   PMH/Meds/All/SocHx/FamHx/ROS:   Past Medical History:  Diagnosis Date   Abdominal hernia    Acute blood loss as cause of postoperative anemia 02/16/2017   Allergy     Arthritis    knees, back, R ankle    Bilateral carpal tunnel syndrome 04/29/2012   Diabetes mellitus    Diabetes mellitus type 2 in obese 12/31/2015   Diverticulitis    Edema    legs   GERD (gastroesophageal reflux disease)    HOH (hard of hearing)    Hypertension    Hypertriglyceridemia 08/30/2015    Started on medication 08/30/15   Lipoma 09/30/2012   Obesity    morbid obesity   OBESITY, MORBID 10/16/2007   Qualifier: Diagnosis of  By: Candia Moats     Osteoarthrosis involving lower leg 10/16/2007   Qualifier: Diagnosis of  By: Candia Moats     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   right   CARPAL TUNNEL RELEASE Left 07/10/2014   Procedure: LEFT ENDOSCOPIC CARPAL TUNNEL RELEASE ;  Surgeon: Alm DELENA Hummer, MD;  Location: [facility];  Service: Orthopedics;  Laterality: Left;   CARPAL TUNNEL RELEASE Right    COLONOSCOPY     COLONOSCOPY WITH PROPOFOL  N/A 01/27/2024   Procedure: COLONOSCOPY WITH PROPOFOL ;  Surgeon: Suzann Inocente HERO, MD;  Location: [facility] ENDOSCOPY;  Service: Gastroenterology;  Laterality: N/A;   HERNIA REPAIR  07/03/2011   abd hernia   JOINT REPLACEMENT     NOSE SURGERY  2008   sinus   TOTAL KNEE ARTHROPLASTY Left 02/06/2017   Procedure: LEFT TOTAL KNEE ARTHROPLASTY;  Surgeon: Lonni CINDERELLA Poli, MD;  Location: [facility] OR;  Service: Orthopedics;  Laterality: Left;   TOTAL KNEE ARTHROPLASTY Right 08/21/2017   TOTAL KNEE ARTHROPLASTY Right 08/21/2017   Procedure: RIGHT TOTAL KNEE ARTHROPLASTY;  Surgeon: Poli Lonni CINDERELLA, MD;  Location: [facility]  OR;  Service: Orthopedics;  Laterality: Right;   TUBAL LIGATION      Family History  Problem Relation Age of Onset   Hypertension Mother    Kidney disease Mother    Diabetes Father    Hypertension Father    Diabetes Sister    Hypertension Sister    Hypertension Sister    Obesity Sister    Diabetes Sister    Diabetes Brother    Hypertension Brother    Heart disease Brother 23       heart attack   Colon cancer Neg Hx     Esophageal cancer Neg Hx    Liver disease Neg Hx      Social Connections: Moderately Integrated (10/19/2024)   Social Connection and Isolation Panel    Frequency of Communication with Friends and Family: More than three times a week    Frequency of Social Gatherings with Friends and Family: Once a week    Attends Religious Services: More than 4 times per year    Active Member of [employer] or Organizations: Yes    Attends Engineer, Structural: More than 4 times per year    Marital Status: Divorced     Current Medications[1]   Physical Exam:   BP 120/64   Pulse (!) 104   Ht 5' 7 (1.702 m)   Wt 280 lb (127 kg)   LMP  (LMP Unknown)   SpO2 94%   BMI 43.85 kg/m   Pertinent Findings  CN II-XII grossly intact Bilateral EAC clear and TM intact with well pneumatized middle ear spaces Anterior rhinoscopy: Septum midline; bilateral inferior turbinates with no hypertrophy No lesions of oral cavity/oropharynx No obviously palpable neck masses/lymphadenopathy/thyromegaly No respiratory distress or stridor      Seprately Identifiable Procedures:  None  Impression & Plans:  [name] is a 74 y.o. female with the following   Assessment and Plan    Left ear tinnitus and hearing loss Intermittent left ear tinnitus. No pulsatile nature at this point, no alarming features - Obtained baseline audiogram to assess hearing and characterize tinnitus.        - f/u phone office visit with results    Thank you for allowing me the opportunity to care for your patient. Please do not hesitate to contact me should you have any other questions.  Sincerely, Chyrl Cohen PA-C [facility] Phone: [phone] Fax: [phone]  12/24/2024, 9:08 AM        [1]  Current Outpatient Medications:    ACCU-CHEK GUIDE TEST test strip, TEST DAILY, Disp: 100 strip, Rfl: 2   Accu-Chek Softclix Lancets lancets, TEST DAILY, Disp: 100 each, Rfl: 2   allopurinol  (ZYLOPRIM ) 100 MG  tablet, TAKE 1 TABLET BY MOUTH DAILY, Disp: 100 tablet, Rfl: 1   Blood Glucose Monitoring Suppl DEVI, Test 1-2 times day. Pend on Insurance dx E11.9, Disp: 1 each, Rfl: 0   Cyanocobalamin  (VITAMIN B-12 PO), Take 1 tablet by mouth daily at 2 PM., Disp: , Rfl:    fenofibrate  (TRICOR ) 145 MG tablet, TAKE 1 TABLET BY MOUTH DAILY, Disp: 100 tablet, Rfl: 1   fluconazole  (DIFLUCAN ) 100 MG tablet, Take 1 tablet (100 mg total) by mouth daily., Disp: 7 tablet, Rfl: 0   fluticasone  (FLONASE ) 50 MCG/ACT nasal spray, Place 2 sprays into both nostrils daily., Disp: 16 mL, Rfl: 1   Lancet Device MISC, 1-2 times daily, Disp: 1 each, Rfl: 0   Lancets Misc. MISC, 1-2 times a day, Disp:  100 each, Rfl: 0   losartan -hydrochlorothiazide  (HYZAAR) 100-12.5 MG tablet, TAKE 1 TABLET BY MOUTH DAILY, Disp: 100 tablet, Rfl: 2   MAGNESIUM  CITRATE PO, Take 1 tablet by mouth daily as needed., Disp: , Rfl:    metFORMIN  (GLUCOPHAGE ) 500 MG tablet, TAKE 1 TABLET BY MOUTH TWICE  DAILY WITH MEALS, Disp: 180 tablet, Rfl: 1   Multiple Vitamin (MULTIVITAMIN) tablet, Take 1 tablet by mouth daily., Disp: , Rfl:    NEOMYCIN -POLYMYXIN-HYDROCORTISONE (CORTISPORIN) 1 % SOLN OTIC solution, Place 3 drops into the left ear every 8 (eight) hours., Disp: 10 mL, Rfl: 0   tirzepatide  (MOUNJARO ) 15 MG/0.5ML Pen, Inject 15 mg into the skin once a week., Disp: 6 mL, Rfl: 1

## 2024-12-25 ENCOUNTER — Ambulatory Visit: Payer: Self-pay | Admitting: Medical

## 2024-12-30 ENCOUNTER — Ambulatory Visit (INDEPENDENT_AMBULATORY_CARE_PROVIDER_SITE_OTHER): Admitting: Audiology

## 2024-12-30 DIAGNOSIS — H903 Sensorineural hearing loss, bilateral: Secondary | ICD-10-CM | POA: Diagnosis not present

## 2024-12-30 NOTE — Progress Notes (Addendum)
" °  [address] [place], [place] [address] [phone]  Audiological Evaluation    Name: Rhenda Oregon Knopf     DOB:   Jan 31, 1951      MRN:   [id number]                                                                                     Service Date: 12/30/2024     Accompanied by: self    Patient comes today after Reyes Cohen, PA-C sent a referral for a hearing evaluation due to concerns with tinnitus.   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 Left ear:  Abnormal eardrum appearance.  Tympanometry: Right ear: Type A - Normal external ear canal volume with normal middle ear pressure and normal tympanic membrane compliance. Findings are consistent with normal middle ear function. Left ear: Type A - Normal external ear canal volume with normal middle ear pressure and normal tympanic membrane compliance. Findings are consistent with normal middle ear function.  Hearing Evaluation The hearing test results were completed under headphones and re-checked with inserts and results are deemed to be of good reliability. Test technique:  conventional    Pure tone Audiometry: Right ear- Normal hearing from [phone], then mild to moderate sensorineural hearing loss from 2000 Hz - 8000 Hz. Left ear-  Normal hearing from 250-500 Hz, then mild to moderately severe sensorineural hearing loss from  1000 Hz - 8000 Hz. Of note- air-bone gap noted at 4000 Hz.   Speech Audiometry: Right ear- Speech Reception Threshold (SRT) was obtained at 25 dBHL. Left ear-Speech Reception Threshold (SRT) was obtained at 30 dBHL.   Word Recognition Score Tested using NU-6 (recorded) Right ear: 96% was obtained at a presentation level of 70 dBHL with contralateral masking which is deemed as  excellent. Left ear: 60% was obtained at a presentation level of 80 dBHL with contralateral masking which is deemed as  poor.   Impression: There is a significant difference in pure tone thresholds from 2k-6k Hz and in  word recognition scores , worse of the left ear.     Recommendations: Follow up with ENT as scheduled.  Repeat audiogram  at least in 12 months, before with concerns of hearing changes.  Recommend hearing aid evaluation after medical clearance.   Recommend use of a white noise machine or a fan to help reduce tinnitus perception.   Judeth Gilles MARIE LEROUX-MARTINEZ, AUD  "

## 2024-12-31 ENCOUNTER — Ambulatory Visit (INDEPENDENT_AMBULATORY_CARE_PROVIDER_SITE_OTHER)

## 2025-01-01 ENCOUNTER — Other Ambulatory Visit

## 2025-01-01 DIAGNOSIS — E1121 Type 2 diabetes mellitus with diabetic nephropathy: Secondary | ICD-10-CM

## 2025-01-01 NOTE — Progress Notes (Signed)
 "  01/01/2025 Name: [name] MRN: [id number] DOB: 12-Jan-1951  Chief Complaint  Patient presents with   Medication Management    [name] is a 74 y.o. year old female who presented for a telephone visit.   They were referred to the pharmacist for assistance in managing diabetes.    Subjective:  Care Team: Primary Care Provider: Bulah Alm GORMAN RIGGERS ;   Medication Access/Adherence  Current Pharmacy:  [facility]) - [place], [place] - [address] [facility] [address] [place] [address] [place] [address] Phone: [phone] Fax: [phone]  [facility] - [place], [place] - [address] [place] [address] Phone: [phone] Fax: [phone]   Patient reports affordability concerns with their medications: No  Patient reports access/transportation concerns to their pharmacy: No  Patient reports adherence concerns with their medications:  No     Diabetes:  Current medications: Mounjaro  15 mg EVERY 10 DAYS, Metformin  500mg  BID Medications tried in the past: Glimepiride , Tradjenta , Ozempic   Current glucose readings:  Reports she is checking post-prandial. Last couple weeks they have been staying under 150   Patient denies hypoglycemic s/sx including dizziness, shakiness, sweating. Patient denies hyperglycemic symptoms including polyuria, polydipsia, polyphagia, nocturia, neuropathy, blurred vision.  Current meal patterns:  States mounjaro  is really helping with appetite and food cravings and that she has worked hard to limit her carbs. No longer eating a snack late at night  Reports she still has spots on her tongue on rare occasion but feels this has resolved, Will reach back out if this changes  Current physical activity: tries to exercise a little each day most days  Current medication access support: None   Objective:  Lab Results  Component Value Date   HGBA1C  7.5 (H) 08/20/2024    Lab Results  Component Value Date   CREATININE 1.19 (H) 08/20/2024   BUN 26 08/20/2024   NA 138 08/20/2024   K 4.1 08/20/2024   CL 100 08/20/2024   CO2 21 08/20/2024    Lab Results  Component Value Date   CHOL 175 08/20/2024   HDL 40 08/20/2024   LDLCALC 110 (H) 08/20/2024   TRIG 137 08/20/2024   CHOLHDL 4.4 08/20/2024    Medications Reviewed Today     Reviewed by Lionell Jon DEL, RPH (Pharmacist) on 01/01/25 at 1534  Med List Status: <None>   Medication Order Taking? Sig Documenting Provider Last Dose Status Informant  ACCU-CHEK GUIDE TEST test strip [id number]  TEST DAILY Bulah Alm GORMAN, PA-C  Active   Accu-Chek Softclix Lancets lancets [id number]  TEST DAILY Tysinger, Alm GORMAN, PA-C  Active   allopurinol  (ZYLOPRIM ) 100 MG tablet [id number] Yes TAKE 1 TABLET BY MOUTH DAILY Bulah Alm GORMAN RIGGERS  Active   Blood Glucose Monitoring Suppl DEVI [id number]  Test 1-2 times day. Pend on Insurance dx E11.9 Tysinger, David S, PA-C  Active   Cyanocobalamin  (VITAMIN B-12 PO) [id number]  Take 1 tablet by mouth daily at 2 PM. [provider]  Active Self, Pharmacy Records  fenofibrate  (TRICOR ) 145 MG tablet [id number] Yes TAKE 1 TABLET BY MOUTH DAILY Tysinger, Alm GORMAN, PA-C  Active   fluconazole  (DIFLUCAN ) 100 MG tablet [id number]  Take 1 tablet (100 mg total) by mouth daily. Tysinger, Alm GORMAN, PA-C  Active   fluticasone  (FLONASE ) 50 MCG/ACT nasal spray [id number]  Place 2 sprays into both nostrils daily. Tysinger, Alm GORMAN, PA-C  Active   Lancet Device MISC [id number]  1-2 times daily Tysinger, Alm RAMAN, PA-C  Active   Lancets Misc. MISC [id number]  1-2 times a day Bulah Alm RAMAN DEVONNA  Active   losartan -hydrochlorothiazide  (HYZAAR) 100-12.5 MG tablet [id number] Yes TAKE 1 TABLET BY MOUTH DAILY Tysinger, David S, PA-C  Active   MAGNESIUM  CITRATE PO [id number]  Take 1 tablet by mouth daily as needed. [provider]  Active Self, Pharmacy Records   metFORMIN  (GLUCOPHAGE ) 500 MG tablet [id number] Yes TAKE 1 TABLET BY MOUTH TWICE  DAILY WITH MEALS Tysinger, Alm RAMAN, PA-C  Active   Multiple Vitamin (MULTIVITAMIN) tablet [id number]  Take 1 tablet by mouth daily. [provider]  Active Self, Pharmacy Records  NEOMYCIN -POLYMYXIN-HYDROCORTISONE (CORTISPORIN) 1 % SOLN OTIC solution [id number]  Place 3 drops into the left ear every 8 (eight) hours.  Patient not taking: Reported on 01/01/2025   Bulah Alm RAMAN, PA-C  Active   tirzepatide  (MOUNJARO ) 15 MG/0.5ML Pen [id number] Yes Inject 15 mg into the skin once a week. Tysinger, Alm RAMAN, PA-C  Active               Assessment/Plan:   Diabetes: - Currently uncontrolled but improving - Reviewed long term cardiovascular and renal outcomes of uncontrolled blood sugar - Reviewed goal A1c, goal fasting, and goal 2 hour post prandial glucose - Reviewed dietary modifications including low carb diet. Recommended sugar free creamer, patient declines -Continue current medication therapy - Patient denies personal or family history of multiple endocrine neoplasia type 2, medullary thyroid  cancer; personal history of pancreatitis or gallbladder disease. - Recommend to check glucose daily at varying times, including post-prandial Counseled on what it means to obtain a truly fasting sugar and goals - Future consideration - could consider addition of SGLT2 if next A1C elevated, may need to consider alternative to Mounjaro  pending f/u with endo (reports she sees them in March) -Patient declines additional med therapy to address elevated LDL, counseled on limiting fried/fatty foods.    Follow Up Plan: 4 months  Jon VEAR Lindau, PharmD Clinical Pharmacist [phone]  "

## 2025-01-02 NOTE — Progress Notes (Signed)
 Appointment scheduled.

## 2025-01-03 ENCOUNTER — Other Ambulatory Visit: Payer: Self-pay | Admitting: Medical

## 2025-01-06 ENCOUNTER — Telehealth (INDEPENDENT_AMBULATORY_CARE_PROVIDER_SITE_OTHER): Payer: Self-pay | Admitting: Physician Assistant

## 2025-01-06 DIAGNOSIS — H9312 Tinnitus, left ear: Secondary | ICD-10-CM

## 2025-01-06 DIAGNOSIS — H903 Sensorineural hearing loss, bilateral: Secondary | ICD-10-CM

## 2025-01-06 NOTE — Telephone Encounter (Signed)
 Audiological evaluation reviewed.  She does have some asymmetric sensorineural hearing loss left greater than right with associated left-sided tinnitus.  I do think MRI IAC would be prudent in the situation.  I discussed with the patient, order placed.  I will discuss results once available.

## 2025-01-07 ENCOUNTER — Other Ambulatory Visit: Payer: Self-pay | Admitting: Medical

## 2025-01-09 ENCOUNTER — Encounter: Payer: Self-pay | Admitting: Audiology

## 2025-03-10 ENCOUNTER — Ambulatory Visit: Payer: Self-pay

## 2025-03-10 ENCOUNTER — Ambulatory Visit: Admitting: Medical

## 2025-04-23 ENCOUNTER — Other Ambulatory Visit
